# Patient Record
Sex: Female | Born: 1935 | Race: White | Hispanic: No | Marital: Married | Smoking: Former smoker
Health system: Southern US, Community
[De-identification: ages and names within clinical notes are randomized; demographics above are authoritative.]

## PROBLEM LIST (undated history)

## (undated) DIAGNOSIS — I10 Essential (primary) hypertension: Secondary | ICD-10-CM

## (undated) DIAGNOSIS — N904 Leukoplakia of vulva: Secondary | ICD-10-CM

## (undated) DIAGNOSIS — I639 Cerebral infarction, unspecified: Secondary | ICD-10-CM

## (undated) DIAGNOSIS — R569 Unspecified convulsions: Secondary | ICD-10-CM

## (undated) DIAGNOSIS — E039 Hypothyroidism, unspecified: Secondary | ICD-10-CM

## (undated) DIAGNOSIS — I4891 Unspecified atrial fibrillation: Secondary | ICD-10-CM

## (undated) DIAGNOSIS — Z9889 Other specified postprocedural states: Secondary | ICD-10-CM

## (undated) DIAGNOSIS — R112 Nausea with vomiting, unspecified: Secondary | ICD-10-CM

## (undated) DIAGNOSIS — E785 Hyperlipidemia, unspecified: Secondary | ICD-10-CM

## (undated) HISTORY — DX: Leukoplakia of vulva: N90.4

## (undated) HISTORY — PX: BLADDER SURGERY: SHX569

## (undated) HISTORY — DX: Hyperlipidemia, unspecified: E78.5

## (undated) HISTORY — PX: TUBAL LIGATION: SHX77

---

## 2005-10-29 ENCOUNTER — Emergency Department (HOSPITAL_COMMUNITY): Admission: EM | Admit: 2005-10-29 | Discharge: 2005-10-30 | Payer: Self-pay | Admitting: Emergency Medicine

## 2007-01-05 ENCOUNTER — Other Ambulatory Visit: Admission: RE | Admit: 2007-01-05 | Discharge: 2007-01-05 | Payer: Self-pay | Admitting: Family Medicine

## 2011-12-26 DIAGNOSIS — E785 Hyperlipidemia, unspecified: Secondary | ICD-10-CM | POA: Diagnosis not present

## 2011-12-26 DIAGNOSIS — I10 Essential (primary) hypertension: Secondary | ICD-10-CM | POA: Diagnosis not present

## 2011-12-26 DIAGNOSIS — E039 Hypothyroidism, unspecified: Secondary | ICD-10-CM | POA: Diagnosis not present

## 2012-01-30 DIAGNOSIS — L299 Pruritus, unspecified: Secondary | ICD-10-CM | POA: Diagnosis not present

## 2012-02-27 DIAGNOSIS — R21 Rash and other nonspecific skin eruption: Secondary | ICD-10-CM | POA: Diagnosis not present

## 2012-02-27 DIAGNOSIS — N644 Mastodynia: Secondary | ICD-10-CM | POA: Diagnosis not present

## 2012-04-20 DIAGNOSIS — I10 Essential (primary) hypertension: Secondary | ICD-10-CM | POA: Diagnosis not present

## 2012-04-20 DIAGNOSIS — R5381 Other malaise: Secondary | ICD-10-CM | POA: Diagnosis not present

## 2012-04-20 DIAGNOSIS — E785 Hyperlipidemia, unspecified: Secondary | ICD-10-CM | POA: Diagnosis not present

## 2012-04-20 DIAGNOSIS — E039 Hypothyroidism, unspecified: Secondary | ICD-10-CM | POA: Diagnosis not present

## 2012-06-12 DIAGNOSIS — I639 Cerebral infarction, unspecified: Secondary | ICD-10-CM

## 2012-06-12 DIAGNOSIS — I4891 Unspecified atrial fibrillation: Secondary | ICD-10-CM

## 2012-06-12 DIAGNOSIS — R569 Unspecified convulsions: Secondary | ICD-10-CM

## 2012-06-12 HISTORY — DX: Unspecified atrial fibrillation: I48.91

## 2012-06-12 HISTORY — DX: Unspecified convulsions: R56.9

## 2012-06-12 HISTORY — DX: Cerebral infarction, unspecified: I63.9

## 2012-06-30 ENCOUNTER — Encounter (HOSPITAL_COMMUNITY): Payer: Self-pay | Admitting: Nurse Practitioner

## 2012-06-30 ENCOUNTER — Inpatient Hospital Stay (HOSPITAL_COMMUNITY): Payer: Medicare Other

## 2012-06-30 ENCOUNTER — Emergency Department (HOSPITAL_COMMUNITY): Payer: Medicare Other

## 2012-06-30 ENCOUNTER — Inpatient Hospital Stay (HOSPITAL_COMMUNITY)
Admission: EM | Admit: 2012-06-30 | Discharge: 2012-07-04 | DRG: 064 | Disposition: A | Discharge status: Home / Self Care | Payer: Medicare Other | Attending: Internal Medicine | Admitting: Internal Medicine

## 2012-06-30 DIAGNOSIS — I509 Heart failure, unspecified: Secondary | ICD-10-CM | POA: Diagnosis present

## 2012-06-30 DIAGNOSIS — G40401 Other generalized epilepsy and epileptic syndromes, not intractable, with status epilepticus: Secondary | ICD-10-CM | POA: Diagnosis not present

## 2012-06-30 DIAGNOSIS — J9600 Acute respiratory failure, unspecified whether with hypoxia or hypercapnia: Secondary | ICD-10-CM | POA: Diagnosis present

## 2012-06-30 DIAGNOSIS — Z23 Encounter for immunization: Secondary | ICD-10-CM | POA: Diagnosis not present

## 2012-06-30 DIAGNOSIS — I4821 Permanent atrial fibrillation: Secondary | ICD-10-CM | POA: Diagnosis not present

## 2012-06-30 DIAGNOSIS — G40319 Generalized idiopathic epilepsy and epileptic syndromes, intractable, without status epilepticus: Secondary | ICD-10-CM | POA: Diagnosis present

## 2012-06-30 DIAGNOSIS — G40309 Generalized idiopathic epilepsy and epileptic syndromes, not intractable, without status epilepticus: Secondary | ICD-10-CM | POA: Diagnosis present

## 2012-06-30 DIAGNOSIS — I6930 Unspecified sequelae of cerebral infarction: Secondary | ICD-10-CM | POA: Diagnosis present

## 2012-06-30 DIAGNOSIS — G40901 Epilepsy, unspecified, not intractable, with status epilepticus: Secondary | ICD-10-CM

## 2012-06-30 DIAGNOSIS — I6350 Cerebral infarction due to unspecified occlusion or stenosis of unspecified cerebral artery: Secondary | ICD-10-CM | POA: Diagnosis not present

## 2012-06-30 DIAGNOSIS — R569 Unspecified convulsions: Secondary | ICD-10-CM | POA: Diagnosis not present

## 2012-06-30 DIAGNOSIS — R930 Abnormal findings on diagnostic imaging of skull and head, not elsewhere classified: Secondary | ICD-10-CM | POA: Diagnosis not present

## 2012-06-30 DIAGNOSIS — Z09 Encounter for follow-up examination after completed treatment for conditions other than malignant neoplasm: Secondary | ICD-10-CM | POA: Diagnosis not present

## 2012-06-30 DIAGNOSIS — I10 Essential (primary) hypertension: Secondary | ICD-10-CM | POA: Diagnosis not present

## 2012-06-30 DIAGNOSIS — I4891 Unspecified atrial fibrillation: Secondary | ICD-10-CM

## 2012-06-30 DIAGNOSIS — N390 Urinary tract infection, site not specified: Secondary | ICD-10-CM | POA: Diagnosis present

## 2012-06-30 DIAGNOSIS — R5383 Other fatigue: Secondary | ICD-10-CM | POA: Diagnosis not present

## 2012-06-30 DIAGNOSIS — I639 Cerebral infarction, unspecified: Secondary | ICD-10-CM

## 2012-06-30 DIAGNOSIS — E785 Hyperlipidemia, unspecified: Secondary | ICD-10-CM | POA: Diagnosis present

## 2012-06-30 DIAGNOSIS — R4789 Other speech disturbances: Secondary | ICD-10-CM | POA: Diagnosis present

## 2012-06-30 DIAGNOSIS — E876 Hypokalemia: Secondary | ICD-10-CM | POA: Diagnosis present

## 2012-06-30 DIAGNOSIS — G9340 Encephalopathy, unspecified: Secondary | ICD-10-CM

## 2012-06-30 DIAGNOSIS — G8389 Other specified paralytic syndromes: Secondary | ICD-10-CM | POA: Diagnosis present

## 2012-06-30 DIAGNOSIS — J984 Other disorders of lung: Secondary | ICD-10-CM | POA: Diagnosis not present

## 2012-06-30 DIAGNOSIS — J320 Chronic maxillary sinusitis: Secondary | ICD-10-CM | POA: Diagnosis not present

## 2012-06-30 DIAGNOSIS — E039 Hypothyroidism, unspecified: Secondary | ICD-10-CM | POA: Diagnosis present

## 2012-06-30 DIAGNOSIS — D696 Thrombocytopenia, unspecified: Secondary | ICD-10-CM | POA: Diagnosis present

## 2012-06-30 DIAGNOSIS — R5381 Other malaise: Secondary | ICD-10-CM | POA: Diagnosis not present

## 2012-06-30 DIAGNOSIS — I6340 Cerebral infarction due to embolism of unspecified cerebral artery: Principal | ICD-10-CM | POA: Diagnosis present

## 2012-06-30 DIAGNOSIS — J9819 Other pulmonary collapse: Secondary | ICD-10-CM | POA: Diagnosis not present

## 2012-06-30 DIAGNOSIS — Z8673 Personal history of transient ischemic attack (TIA), and cerebral infarction without residual deficits: Secondary | ICD-10-CM | POA: Diagnosis not present

## 2012-06-30 DIAGNOSIS — A498 Other bacterial infections of unspecified site: Secondary | ICD-10-CM | POA: Diagnosis present

## 2012-06-30 DIAGNOSIS — R404 Transient alteration of awareness: Secondary | ICD-10-CM | POA: Diagnosis not present

## 2012-06-30 DIAGNOSIS — R918 Other nonspecific abnormal finding of lung field: Secondary | ICD-10-CM | POA: Diagnosis not present

## 2012-06-30 DIAGNOSIS — R4182 Altered mental status, unspecified: Secondary | ICD-10-CM | POA: Diagnosis not present

## 2012-06-30 DIAGNOSIS — D72829 Elevated white blood cell count, unspecified: Secondary | ICD-10-CM | POA: Diagnosis present

## 2012-06-30 DIAGNOSIS — I059 Rheumatic mitral valve disease, unspecified: Secondary | ICD-10-CM | POA: Diagnosis not present

## 2012-06-30 HISTORY — DX: Cerebral infarction, unspecified: I63.9

## 2012-06-30 HISTORY — DX: Unspecified convulsions: R56.9

## 2012-06-30 HISTORY — DX: Unspecified atrial fibrillation: I48.91

## 2012-06-30 HISTORY — DX: Hypothyroidism, unspecified: E03.9

## 2012-06-30 HISTORY — DX: Essential (primary) hypertension: I10

## 2012-06-30 LAB — TROPONIN I: Troponin I: <0.3 ng/mL (ref <0.30)

## 2012-06-30 LAB — CBC
HCT: 48.7 % — ABNORMAL HIGH (ref 36.0–46.0)
Hemoglobin: 16.6 g/dL — ABNORMAL HIGH (ref 12.0–15.0)
MCH: 31.4 pg (ref 26.0–34.0)
MCV: 92.1 fL (ref 78.0–100.0)
Platelets: 179 10*3/uL (ref 150–400)
RBC: 5.29 MIL/uL — ABNORMAL HIGH (ref 3.87–5.11)
RDW: 13 % (ref 11.5–15.5)

## 2012-06-30 LAB — COMPREHENSIVE METABOLIC PANEL
Alkaline Phosphatase: 71 U/L (ref 39–117)
CO2: 22 mEq/L (ref 19–32)
Calcium: 9.9 mg/dL (ref 8.4–10.5)
Chloride: 103 mEq/L (ref 96–112)
GFR calc Af Amer: 72 mL/min — ABNORMAL LOW (ref >90)

## 2012-06-30 LAB — DIFFERENTIAL
Basophils Absolute: 0 10*3/uL (ref 0.0–0.1)
Eosinophils Relative: 3 % (ref 0–5)
Lymphocytes Relative: 45 % (ref 12–46)
Neutrophils Relative %: 42 % — ABNORMAL LOW (ref 43–77)

## 2012-06-30 LAB — PROTIME-INR: INR: 1.07 (ref 0.00–1.49)

## 2012-06-30 LAB — POCT I-STAT TROPONIN I

## 2012-06-30 LAB — URINALYSIS, ROUTINE W REFLEX MICROSCOPIC
Glucose, UA: 100 mg/dL — AB
Ketones, ur: NEGATIVE mg/dL
Protein, ur: NEGATIVE mg/dL
Urobilinogen, UA: 0.2 mg/dL (ref 0.0–1.0)

## 2012-06-30 LAB — URINE MICROSCOPIC-ADD ON

## 2012-06-30 LAB — POCT I-STAT, CHEM 8
BUN: 13 mg/dL (ref 6–23)
Creatinine, Ser: 0.9 mg/dL (ref 0.50–1.10)
Glucose, Bld: 157 mg/dL — ABNORMAL HIGH (ref 70–99)
Hemoglobin: 17.3 g/dL — ABNORMAL HIGH (ref 12.0–15.0)
TCO2: 22 mmol/L (ref 0–100)

## 2012-06-30 LAB — RAPID URINE DRUG SCREEN, HOSP PERFORMED
Amphetamines: NOT DETECTED
Barbiturates: NOT DETECTED
Cocaine: NOT DETECTED
Opiates: NOT DETECTED

## 2012-06-30 LAB — POCT I-STAT 3, ART BLOOD GAS (G3+)
Acid-base deficit: 7 mmol/L — ABNORMAL HIGH (ref 0.0–2.0)
Bicarbonate: 21.7 mEq/L (ref 20.0–24.0)
TCO2: 23 mmol/L (ref 0–100)
pO2, Arterial: 354 mmHg — ABNORMAL HIGH (ref 80.0–100.0)

## 2012-06-30 LAB — APTT: aPTT: 31 s (ref 24–37)

## 2012-06-30 MED ORDER — PROPOFOL 10 MG/ML IV EMUL
5.0000 ug/kg/min | INTRAVENOUS | Status: DC
  Administered 2012-06-30: 15 ug/kg/min via INTRAVENOUS
  Administered 2012-06-30: 5 ug/kg/min via INTRAVENOUS
  Administered 2012-07-01: 25 ug/kg/min via INTRAVENOUS
  Filled 2012-06-30 (×3): qty 100

## 2012-06-30 MED ORDER — ROCURONIUM BROMIDE 50 MG/5ML IV SOLN
INTRAVENOUS | Status: AC
  Filled 2012-06-30: qty 2

## 2012-06-30 MED ORDER — LABETALOL HCL 5 MG/ML IV SOLN
10.0000 mg | Freq: Once | INTRAVENOUS | Status: AC
  Administered 2012-06-30: 10 mg via INTRAVENOUS

## 2012-06-30 MED ORDER — MIDAZOLAM HCL 2 MG/2ML IJ SOLN
4.0000 mg | Freq: Once | INTRAMUSCULAR | Status: AC
  Administered 2012-06-30: 4 mg via INTRAVENOUS
  Filled 2012-06-30: qty 4

## 2012-06-30 MED ORDER — BIOTENE DRY MOUTH MT LIQD
15.0000 mL | Freq: Four times a day (QID) | OROMUCOSAL | Status: DC
  Administered 2012-06-30 – 2012-07-01 (×5): 15 mL via OROMUCOSAL

## 2012-06-30 MED ORDER — SUCCINYLCHOLINE CHLORIDE 20 MG/ML IJ SOLN
INTRAMUSCULAR | Status: AC
  Administered 2012-06-30: 200 mg
  Filled 2012-06-30: qty 1

## 2012-06-30 MED ORDER — LIDOCAINE HCL (CARDIAC) 20 MG/ML IV SOLN
INTRAVENOUS | Status: AC
  Administered 2012-06-30: 100 mg
  Filled 2012-06-30: qty 5

## 2012-06-30 MED ORDER — LABETALOL HCL 5 MG/ML IV SOLN
INTRAVENOUS | Status: AC
  Administered 2012-06-30: 10 mg via INTRAVENOUS
  Filled 2012-06-30: qty 4

## 2012-06-30 MED ORDER — SODIUM CHLORIDE 0.9 % IV SOLN
INTRAVENOUS | Status: DC
  Administered 2012-06-30 – 2012-07-01 (×3): via INTRAVENOUS

## 2012-06-30 MED ORDER — CHLORHEXIDINE GLUCONATE 0.12 % MT SOLN
15.0000 mL | Freq: Two times a day (BID) | OROMUCOSAL | Status: DC
  Administered 2012-06-30 – 2012-07-01 (×3): 15 mL via OROMUCOSAL
  Filled 2012-06-30 (×3): qty 15

## 2012-06-30 MED ORDER — SODIUM CHLORIDE 0.9 % IV SOLN
500.0000 mg | Freq: Two times a day (BID) | INTRAVENOUS | Status: DC
  Administered 2012-06-30 – 2012-07-03 (×7): 500 mg via INTRAVENOUS
  Filled 2012-06-30 (×8): qty 5

## 2012-06-30 MED ORDER — NICARDIPINE HCL IN NACL 20-0.86 MG/200ML-% IV SOLN
5.0000 mg/h | INTRAVENOUS | Status: DC
  Administered 2012-06-30: 7.5 mg/h via INTRAVENOUS
  Administered 2012-06-30: 5 mg/h via INTRAVENOUS
  Administered 2012-07-01: 4 mg/h via INTRAVENOUS
  Filled 2012-06-30 (×6): qty 200

## 2012-06-30 MED ORDER — FENTANYL CITRATE 0.05 MG/ML IJ SOLN
100.0000 ug | Freq: Once | INTRAMUSCULAR | Status: AC
  Administered 2012-06-30: 100 ug via INTRAVENOUS
  Filled 2012-06-30: qty 2

## 2012-06-30 MED ORDER — SODIUM CHLORIDE 0.9 % IV SOLN
1000.0000 mg | Freq: Once | INTRAVENOUS | Status: AC
  Administered 2012-06-30: 1000 mg via INTRAVENOUS
  Filled 2012-06-30: qty 10

## 2012-06-30 MED ORDER — SODIUM CHLORIDE 0.9 % IV BOLUS (SEPSIS)
1000.0000 mL | Freq: Once | INTRAVENOUS | Status: AC
  Administered 2012-06-30: 1000 mL via INTRAVENOUS

## 2012-06-30 MED ORDER — ASPIRIN 300 MG RE SUPP
300.0000 mg | Freq: Every day | RECTAL | Status: DC
  Administered 2012-06-30: 300 mg via RECTAL
  Filled 2012-06-30 (×3): qty 1

## 2012-06-30 MED ORDER — ETOMIDATE 2 MG/ML IV SOLN
INTRAVENOUS | Status: AC
  Administered 2012-06-30: 20 mg
  Filled 2012-06-30: qty 20

## 2012-06-30 MED ORDER — LORAZEPAM 2 MG/ML IJ SOLN
INTRAMUSCULAR | Status: AC
  Administered 2012-06-30: 2 mg
  Filled 2012-06-30: qty 2

## 2012-06-30 MED ORDER — PANTOPRAZOLE SODIUM 40 MG IV SOLR
40.0000 mg | Freq: Every day | INTRAVENOUS | Status: DC
  Administered 2012-06-30 – 2012-07-02 (×3): 40 mg via INTRAVENOUS
  Filled 2012-06-30 (×4): qty 40

## 2012-06-30 NOTE — Procedures (Signed)
Central Venous Catheter Insertion Procedure Note Melissa Knight [id number] Jan 10, 1933  Procedure: Insertion of Central Venous Catheter Indications: Assessment of intravascular volume, Drug and/or fluid administration and Frequent blood sampling  Procedure Details Consent: Risks of procedure as well as the alternatives and risks of each were explained to the (patient/caregiver).  Consent for procedure obtained. Time Out: Verified patient identification, verified procedure, site/side was marked, verified correct patient position, special equipment/implants available, medications/allergies/relevent history reviewed, required imaging and test results available.  Performed  Maximum sterile technique was used including antiseptics, cap, gloves, gown, hand hygiene, mask and sheet. Skin prep: Chlorhexidine; local anesthetic administered A antimicrobial bonded/coated triple lumen catheter was placed in the right internal jugular vein using the Seldinger technique. Ultrasound guidance used.yes Catheter placed to 18 cm. Blood aspirated via all 3 ports and then flushed x 3. Line sutured x 2 and dressing applied.  Evaluation Blood flow good Complications: No apparent complications Patient did tolerate procedure well. Chest X-ray ordered to verify placement.  CXR: pending.  Brett Canales Minor ACNP [facility] PCCM Pager [phone] till 3 pm If no answer page [phone] 06/30/2012, 12:03 PM   I was present for this procedure. Luisa Hart WrightMD

## 2012-06-30 NOTE — ED Notes (Signed)
Per ems: husband states he heard the pt get up normally this morning to use the restroom, he did not talk to her at the time. Shortly after at 0730 he found her in the bed not talking or moving her L side. On ems arrival, code stroke called LSN 0730. Ems reports pt with sluggish L pupil and L sided weakness en route. ON arrival to ED pt began to have seizure, Dr Bebe Shaggy at bedside to intubate the pt

## 2012-06-30 NOTE — H&P (Signed)
Name: Melissa Knight MRN: [id number] DOB: 12/24/1932    LOS: 0  Referring Provider:  EDP Reason for Referral:  Seizure, VDRF, CVA  PULMONARY / CRITICAL CARE MEDICINE  HPI:  78 year old white female admitted with acute onset right parietal stroke with status epilepticus and intubated. Critical Care called for consultation. Past history is not fully available. The patient was last seen normal last night and went to bed and when she awoke this morning she had right gaze preference snoring respirations and left-sided weakness. Her husband called EMS who responded and 15 minutes and they brought the patient to [facility] emergency room whereupon the patient was intubated. The patient was in a seizure type state upon arrival. CT scan shows a right parietal infarct. Patient is hypertensive at this time. There is a past history of hypothyroidism and congestive failure and hypertension. We do not have a full past history on this patient.  Past Medical History  Diagnosis Date  . Hypertension   . CHF (congestive heart failure)   . Thyroid disease    History reviewed. No pertinent past surgical history. Prior to Admission medications   Not on File   Allergies Not on File  Family History History reviewed. No pertinent family history. Social History  does not have a smoking history on file. She does not have any smokeless tobacco history on file. Her alcohol and drug histories not on file.  Review Of Systems:  Review of system is taken in detail and is negative except as noted above large portions of the review of system cannot be fully obtained as the patient is unresponsive  Brief patient description:  78 year-old female with right parietal stroke and seizures on mechanical ventilatory support  Events Since Admission: CT scan of the head 06/30/2012 shows right parietal stroke  Current Status: Critically ill on mechanical ventilatory support and hypertensive  Vital Signs: Temp:  [97.3 F  (36.3 C)] 97.3 F (36.3 C) (10/19 1145) Pulse Rate:  [71-77] 77  (10/19 1145) Resp:  [16-18] 16  (10/19 1145) BP: (149-228)/(82-136) 206/136 mmHg (10/19 1145) SpO2:  [96 %-99 %] 99 % (10/19 1145) FiO2 (%):  [50 %-100 %] 50 % (10/19 1020) Weight:  [81.647 kg (180 lb)] 81.647 kg (180 lb) (10/19 1043)  Physical Examination: General:  Sedated with endotracheal tube in place not actively seizing Neuro:  Left-sided weakness and sedated HEENT:  No lesions Neck:  No jugular venous distention no thyromegaly no lymphadenopathy Cardiovascular:  Regular rate and rhythm normal S1-S2 no S3 no S4 Lungs:  Distant breath sounds Abdomen:   Soft nontender no organomegaly Musculoskeletal:  Full range of motion Skin:  Clear  Principal Problem:  *CVA (cerebral infarction) Active Problems:  Seizure disorder, generalized convulsive, intractable  Acute respiratory failure  HTN (hypertension), malignant   ASSESSMENT AND PLAN  PULMONARY  Lab 06/30/12 1015  PHART 7.214*  PCO2ART 53.9*  PO2ART 354.0*  HCO3 21.7  O2SAT 100.0   Ventilator Settings: Vent Mode:  [-] PRVC FiO2 (%):  [50 %-100 %] 50 % Set Rate:  [14 bmp-16 bmp] 16 bmp Vt Set:  [500 mL-550 mL] 550 mL PEEP:  [5 cmH20] 5 cmH20 Plateau Pressure:  [8 cmH20-19 cmH20] 19 cmH20 CXR:  ET tube in good position with no active disease ETT:  06/30/2012  A:  Acute respiratory failure due to seizures with right parietal stroke  P:   Full for support Daily wakeup assessments and SBT  CARDIOVASCULAR  Lab 06/30/12 0920  TROPONINI <0.30  LATICACIDVEN --  PROBNP --   ECG:  Reviewed Lines: Peripheral IVs  A: Hypertensive P:  Obtain echo Cardene drip  RENAL  Lab 06/30/12 0929 06/30/12 0920  NA 146* 146*  K 3.5 3.3*  CL 106 103  CO2 -- 22  BUN 13 12  CREATININE 0.90 0.88  CALCIUM -- 9.9  MG -- --  PHOS -- --   Intake/Output    None    Foley:  In place  A:  No acute renal issues P:   Monitor  GASTROINTESTINAL  Lab  06/30/12 0920  AST 26  ALT 18  ALKPHOS 71  BILITOT 0.6  PROT 7.5  ALBUMIN 3.8    A:  No GI issues P:   Monitor  HEMATOLOGIC  Lab 06/30/12 0929 06/30/12 0920  HGB 17.3* 16.6*  HCT 51.0* 48.7*  PLT -- 179  INR -- 1.07  APTT -- 31   A:  No hematologic issues the patient was not given thrombolytics P:  Monitor  INFECTIOUS  Lab 06/30/12 0920  WBC 13.4*  PROCALCITON --   Cultures: None Antibiotics: None  A:  No infectious disease issues P:   Monitor  ENDOCRINE  Lab 06/30/12 0921  GLUCAP 157*   A:   Mild hyperglycemia   P:   Monitor CBGs  NEUROLOGIC  A:  Acute right parietal stroke with status epilepticus P:   Stroke admission orders set Followup CT scan and MRI Neurology consultation Load with IV Keppra and maintenance dose Propofol sedation   BEST PRACTICE / DISPOSITION Level of Care:  ICU Primary Service:  PC CM Consultants:  Neurology Code Status:  Full Diet:  N.p.o. DVT Px:  SCDs GI Px:  Protonix  IV Skin Integrity:  Intact Social / Family:  Family updated at bedside   CC  Shan Levans, M.D. Pulmonary and Critical Care Medicine [facility] Pager: [phone]  06/30/2012, 11:52 AM

## 2012-06-30 NOTE — ED Notes (Signed)
Dr. Bebe Shaggy and RT preparing for intubation.

## 2012-06-30 NOTE — Consult Note (Signed)
Chief Complaint: New-onset left-sided weakness and slurred speech presenting in code stroke status.  HPI: Melissa Knight is an 78 y.o. female with a history of hypertension, congestive heart failure and thyroid disease, presenting with new onset left-sided weakness and speech difficulty as well as gaze preference to the left side. Patient was brought to the hospital in code stroke status. On the way to CT scan she had a witnessed generalized seizure and was returned to the emergency room and subsequently intubated for airway protection. She was given Ativan 2 mg IV. CT scan of her head showed low density area involving the right parieto-occipital region, possibly a manifestation of subacute infarction. There was no sign of intracranial hemorrhage. NIH score could not be adequately assessed because the patient was intubated and had been given paralytic agents.  LSN: 7:30 AM today tPA Given: No: Witnessed generalized seizure activity MRankin: 4  Past Medical History  Diagnosis Date  . Hypertension   . CHF (congestive heart failure)   . Thyroid disease     Family history: Unavailable  Medications:  Prior to Admission:  No prescriptions prior to admission     Physical Examination: Blood pressure 149/82, height 5\' 5"  (1.651 m), weight 81.647 kg (180 lb).  Neurologic Examination: Patient was intubated and on mechanical ventilation. No clear spontaneous respirations were seen. Patient was responsive with slight withdrawal movements of right extremities to noxious stimuli, but no movements of left extremities to noxious stimuli. Pupils were equal and reacted normally to light. Extraocular movements are absent with oculocephalic maneuvers. There was no clear facial asymmetry. Tone was increased in right upper and lower extremities with flaccid left extremities. There was no abnormal posturing. Deep tendon reflexes were 2+ and brisk, and symmetrical. Plantar response on the left was extensor  on the right and mute. Carotid auscultation was normal.  Ct Head Wo Contrast  06/30/2012  *RADIOLOGY REPORT*  Clinical Data: Weakness.  Left-sided gaze.  Code stroke.  CT HEAD WITHOUT CONTRAST  Technique:  Contiguous axial images were obtained from the base of the skull through the vertex without contrast.  Comparison: None.  Findings: The brain stem, cerebral peduncles, thalami, ventricular system, and basilar cisterns appear unremarkable.  Remote lacunar infarct in the head of the right caudate nucleus noted.  Abnormal hypodensity noted in the right parietal lobe.  To involve cortex and white matter, suspicious for cytotoxic edema.  Patchy white matter hypodensities are present in the periventricular white matter  No intracranial hemorrhage observed. No definite mass lesion. Polypoid mucoperiosteal thickening noted in the right maxillary sinus, with mild mucosal thickening in the left maxillary sinus. Minimal sphenoid and ethmoid chronic sinusitis.  IMPRESSION:  1.  Right parietal lobe hypodensity on image 16 suspicious for subacute stroke, potentially in the posterior watershed distribution.  No hemorrhage identified. 2.  Patchy white matter hypodensities in the periventricular white matter, most likely chronic ischemic microvascular white matter disease. 3.  Old remote lacunar infarct of the head of the right caudate nucleus. 4.  Chronic maxillary sinusitis with mild chronic sphenoid and ethmoid sinusitis.  I discussed these findings by telephone with Dr. Bebe Shaggy at 10:11 a.m. on 06/30/2012.   Original Report Authenticated By: Dellia Cloud, M.D.    Dg Chest Port 1 View  06/30/2012  *RADIOLOGY REPORT*  Clinical Data: Weakness, endotracheal tube placement  PORTABLE CHEST - 1 VIEW  Comparison: 10/30/2005  Findings: Cardiomediastinal silhouette is stable.  There is poor inspiration.  Mild basilar atelectasis.  Endotracheal tube in place  with tip 2.4 cm above the carina.  NG tube in place.  No  diagnostic pneumothorax.  Stable calcified granuloma right midlung. No acute infiltrate or pulmonary edema.  IMPRESSION: Endotracheal tube in place with tip 2.4 cm above the carina.  No diagnostic pneumothorax.  NG tube in place. No acute infiltrate or pulmonary edema.   Original Report Authenticated By: Natasha Mead, M.D.     Assessment: 78 y.o. female presenting with probable acute right ischemic stroke as well as generalized seizure activity of new onset.  Stroke Risk Factors - hypertension  Plan: 1. HgbA1c, fasting lipid panel 2. MRI, MRA  of the brain without contrast 3. PT consult, OT consult, Speech consult 4. Echocardiogram 5. Carotid dopplers 6. Prophylactic therapy-Antiplatelet med: Aspirin 300 mg per day rectally 7. Keppra 1000 mg loading dose IV followed by 500 mg every 12 hours for seizure management 8. EEG on 07/02/2012.  C.R. Roseanne Reno, MD [facility]  06/30/2012, 11:05 AM

## 2012-06-30 NOTE — ED Provider Notes (Signed)
History     CSN: [id number]  Arrival date & time 06/30/12  7829   First MD Initiated Contact with Patient 06/30/12 [phone]      Chief Complaint  Patient presents with  . Code Stroke     Patient is a 78 y.o. female presenting with weakness. The history is provided by the EMS personnel. The history is limited by the condition of the patient.  Weakness Primary symptoms do not include fever. Episode onset: at 0730am. The symptoms are worsening. The neurological symptoms are focal.  Additional symptoms include weakness.  nothing improves symptoms Nothing worsens symptoms  Pt presents for code stroke with EMS Per EMS, pt last seen normal at 0730, then had left sided weakness/confusion En route, EMS reports she became more confused No other details are known at this time   PMH - unknown   No past surgical history on file.  No family history on file.  History  Substance Use Topics  . Smoking status: Not on file  . Smokeless tobacco: Not on file  . Alcohol Use: Not on file  Social history - unknown  OB History    No data available      Review of Systems  Unable to perform ROS: Mental status change  Constitutional: Negative for fever.  Neurological: Positive for weakness.    Allergies  Review of patient's allergies indicates not on file.  Home Medications  No current outpatient prescriptions on file.  BP 149/82 BP 149/82  Ht 5\' 5"  (1.651 m)   Physical Exam CONSTITUTIONAL: Well developed/well nourished HEAD AND FACE: Normocephalic/atraumatic EYES: PERRL, deviates to left ENMT: Mucous membranes moist, tongue laceration noted, blood noted in mouth NECK: supple no meningeal signs SPINE:entire spine nontender CV: S1/S2 noted, no murmurs/rubs/gallops noted LUNGS: coarse BS noted bilaterally ABDOMEN: soft, nontender, no rebound or guarding GU:no cva tenderness NEURO: Pt is awake but does not respond to voice.  She has some motor in right hand and will resist  when I lift her arm EXTREMITIES: pulses normal, full ROM SKIN: warm, color normal PSYCH: altered mental status  ED Course  OG placement Date/Time: 06/30/2012 9:41 AM Performed by: Joya Gaskins Authorized by: Joya Gaskins Consent: The procedure was performed in an emergent situation. Patient sedated: yes Vitals: Vital signs were monitored during sedation. Patient tolerance: Patient tolerated the procedure well with no immediate complications. Comments: OG tube placed by myself immediately after intubation     INTUBATION Performed by: Joya Gaskins  Required items: required blood products, implants, devices, and special equipment available Patient identity confirmed: provided demographic data and hospital-assigned identification number Time out: Immediately prior to procedure a "time out" was called to verify the correct patient, procedure, equipment, support staff and site/side marked as required.  Indications: seizure, unresponsive  Intubation method: Glidescope Laryngoscopy   Preoxygenation: BVM  Sedatives: Etomidate Paralytic: Succinylcholine  Tube Size: 7.5 cuffed  Post-procedure assessment: chest rise and ETCO2 monitor Breath sounds: equal and absent over the epigastrium Tube secured with: ETT holder Chest x-ray interpreted by radiologist and me.   Patient tolerated the procedure well with no immediate complications.    CRITICAL CARE Performed by: Joya Gaskins   Total critical care time: 40  Critical care time was exclusive of separately billable procedures and treating other patients.  Critical care was necessary to treat or prevent imminent or life-threatening deterioration.  Critical care was time spent personally by me on the following activities: development of treatment plan with patient and/or surrogate as  well as nursing, discussions with consultants, evaluation of patient's response to treatment, examination of patient,  obtaining history from patient or surrogate, ordering and performing treatments and interventions, ordering and review of laboratory studies, ordering and review of radiographic studies, pulse oximetry and re-evaluation of patient's condition.    Labs Reviewed  GLUCOSE, CAPILLARY - Abnormal; Notable for the following:    Glucose-Capillary 157 (*)     All other components within normal limits  POCT I-STAT, CHEM 8 - Abnormal; Notable for the following:    Sodium 146 (*)     Glucose, Bld 157 (*)     Hemoglobin 17.3 (*)     HCT 51.0 (*)     All other components within normal limits  PROTIME-INR  APTT  CBC  DIFFERENTIAL  COMPREHENSIVE METABOLIC PANEL  TROPONIN I  URINE RAPID DRUG SCREEN (HOSP PERFORMED)   9:39 AM Pt seen on arrival (0916am) for concern for code stroke (LSN at 0730 per EMS) On arrival, she was protecting airway/awake but not responding to questions On her way to CT scanner she had tonic clonic seizure She was given ativan CBG>100 Pt was immediately intubated without difficulty Will send back to CT scanner 10:15 AM D/w radiology No ICH She has subacute infarct D/w dr Roseanne Reno - recommend CT angio head/neck to eval for any lesion that could be removed by IR Sedation ordered Will speak to family Dr Roseanne Reno does not recommend IV TPA 10:17 AM Dr Roseanne Reno decides to cancel ct head/neck and admit to critical care tPA in stroke considered but not given due to:  Pt had seizure and cancelled by dr Roseanne Reno  10:32 AM Spoke to family Husband reports this morning she "staring off" and had "rigid body" Possible seizure this morning D/w critical care dr Delford Field will admit to icu Ok to write holding orders   MDM  Nursing notes including past medical history and social history reviewed and considered in documentation Labs/vital reviewed and considered xrays reviewed and considered        Date: 06/30/2012  Rate: 102  Rhythm: normal sinus rhythm  QRS Axis:  normal  Intervals: normal  ST/T Wave abnormalities: nonspecific ST changes  Conduction Disutrbances:none  Narrative Interpretation:   Old EKG Reviewed: unchanged    Joya Gaskins, MD 06/30/12 [phone]

## 2012-06-30 NOTE — ED Notes (Signed)
Pt withdrawing to insertion of foley.

## 2012-06-30 NOTE — Progress Notes (Signed)
  Echocardiogram 2D Echocardiogram has been performed.  Melissa Knight 06/30/2012, 4:51 PM

## 2012-06-30 NOTE — ED Notes (Signed)
Code Stroke Encoded 0908 Code Stroke Called 0900 Patient arrival [phone] EDP exam 0916 Stroke Team Arrival [phone] Last seen normal 0730 Pt arrival in CT 0950 pt was intubated d/t seizure activity while initially in route to CT.

## 2012-06-30 NOTE — ED Notes (Signed)
Pt returned from CT back to trauma A.

## 2012-06-30 NOTE — ED Notes (Signed)
Portable chest x ray completed.

## 2012-06-30 NOTE — ED Notes (Signed)
CBG completed of 157

## 2012-06-30 NOTE — ED Notes (Signed)
Brett Canales, NP at the bedside speaking with the family.

## 2012-06-30 NOTE — ED Notes (Signed)
Pt on the way to CT and started seizing prior to exiting through the doors to xray hallway. Pt pulled into Trauma A and moved onto stretcher. Dr. Bebe Shaggy at the bedside. Morrie Sheldon, RN, Aundra Millet, RN at the bedside with EMS.

## 2012-06-30 NOTE — Progress Notes (Signed)
Chaplain note: While looking for another patient in the ED I met patients husband in hallway near Consult B and engaged him in conversation.  He informed me his wife had had a massive stroke and was in Trauma A. Chaplain provided emotional support and refreshments for family.  Will check on family throughout the day as needed.  Rutherford Nail Chaplain

## 2012-06-30 NOTE — Procedures (Signed)
Arterial Catheter Insertion Procedure Note Melissa Knight [id number] 1933/04/09  Procedure: Insertion of Arterial Catheter  Indications: Blood pressure monitoring  Procedure Details Consent: Risks of procedure as well as the alternatives and risks of each were explained to the (patient/caregiver).  Consent for procedure obtained. Time Out: Verified patient identification, verified procedure, site/side was marked, verified correct patient position, special equipment/implants available, medications/allergies/relevent history reviewed, required imaging and test results available.  Performed  Maximum sterile technique was used including antiseptics, cap, gloves, gown, hand hygiene and mask. Skin prep: Chlorhexidine; local anesthetic administered 20 gauge catheter was inserted into left radial artery using the Seldinger technique.  Evaluation Blood flow good; BP tracing good. Complications: No apparent complications.   Aline inserted at this time. Attempts x 3. Inserted with the help of Vernell Morgans. Secured and zeroed per policy. BP 209/101. RT will continue to monitor.  Lurlean Leyden 06/30/2012

## 2012-07-01 ENCOUNTER — Inpatient Hospital Stay (HOSPITAL_COMMUNITY): Payer: Medicare Other

## 2012-07-01 DIAGNOSIS — R5383 Other fatigue: Secondary | ICD-10-CM | POA: Diagnosis not present

## 2012-07-01 DIAGNOSIS — G40319 Generalized idiopathic epilepsy and epileptic syndromes, intractable, without status epilepticus: Secondary | ICD-10-CM | POA: Diagnosis not present

## 2012-07-01 DIAGNOSIS — R918 Other nonspecific abnormal finding of lung field: Secondary | ICD-10-CM | POA: Diagnosis not present

## 2012-07-01 DIAGNOSIS — R404 Transient alteration of awareness: Secondary | ICD-10-CM | POA: Diagnosis not present

## 2012-07-01 DIAGNOSIS — I6350 Cerebral infarction due to unspecified occlusion or stenosis of unspecified cerebral artery: Secondary | ICD-10-CM | POA: Diagnosis not present

## 2012-07-01 DIAGNOSIS — G40401 Other generalized epilepsy and epileptic syndromes, not intractable, with status epilepticus: Secondary | ICD-10-CM | POA: Diagnosis not present

## 2012-07-01 DIAGNOSIS — J9600 Acute respiratory failure, unspecified whether with hypoxia or hypercapnia: Secondary | ICD-10-CM | POA: Diagnosis not present

## 2012-07-01 DIAGNOSIS — J9819 Other pulmonary collapse: Secondary | ICD-10-CM | POA: Diagnosis not present

## 2012-07-01 LAB — BASIC METABOLIC PANEL
BUN: 11 mg/dL (ref 6–23)
CO2: 22 mEq/L (ref 19–32)
Calcium: 8.1 mg/dL — ABNORMAL LOW (ref 8.4–10.5)
Chloride: 104 mEq/L (ref 96–112)
Creatinine, Ser: 0.67 mg/dL (ref 0.50–1.10)

## 2012-07-01 LAB — CBC
HCT: 41.4 % (ref 36.0–46.0)
Hemoglobin: 14.4 g/dL (ref 12.0–15.0)
MCH: 30.3 pg (ref 26.0–34.0)
MCHC: 34.8 g/dL (ref 30.0–36.0)
MCV: 87.2 fL (ref 78.0–100.0)
Platelets: 147 10*3/uL — ABNORMAL LOW (ref 150–400)
RBC: 4.75 MIL/uL (ref 3.87–5.11)
RDW: 13.4 % (ref 11.5–15.5)
WBC: 13.9 10*3/uL — ABNORMAL HIGH (ref 4.0–10.5)

## 2012-07-01 LAB — LIPID PANEL
HDL: 57 mg/dL (ref >39)
LDL Cholesterol: 156 mg/dL — ABNORMAL HIGH (ref 0–99)
Total CHOL/HDL Ratio: 4.4 RATIO
Triglycerides: 177 mg/dL — ABNORMAL HIGH (ref <150)
VLDL: 35 mg/dL (ref 0–40)

## 2012-07-01 LAB — GLUCOSE, CAPILLARY
Glucose-Capillary: 124 mg/dL — ABNORMAL HIGH (ref 70–99)
Glucose-Capillary: 100 mg/dL — ABNORMAL HIGH (ref 70–99)

## 2012-07-01 LAB — HEMOGLOBIN A1C
Hgb A1c MFr Bld: 6.1 % — ABNORMAL HIGH (ref <5.7)
Mean Plasma Glucose: 128 mg/dL — ABNORMAL HIGH (ref <117)

## 2012-07-01 LAB — PRO B NATRIURETIC PEPTIDE: Pro B Natriuretic peptide (BNP): 1230 pg/mL — ABNORMAL HIGH (ref 0–450)

## 2012-07-01 MED ORDER — METOPROLOL TARTRATE 1 MG/ML IV SOLN
2.5000 mg | INTRAVENOUS | Status: DC | PRN
  Filled 2012-07-01: qty 5

## 2012-07-01 MED ORDER — POTASSIUM CHLORIDE 10 MEQ/100ML IV SOLN
10.0000 meq | INTRAVENOUS | Status: AC
  Administered 2012-07-01 (×4): 10 meq via INTRAVENOUS
  Filled 2012-07-01: qty 400

## 2012-07-01 MED ORDER — ASPIRIN 325 MG PO TABS
325.0000 mg | ORAL_TABLET | Freq: Every day | ORAL | Status: DC
  Administered 2012-07-01 – 2012-07-02 (×2): 325 mg via ORAL
  Filled 2012-07-01 (×2): qty 1

## 2012-07-01 MED ORDER — BIOTENE DRY MOUTH MT LIQD
15.0000 mL | Freq: Two times a day (BID) | OROMUCOSAL | Status: DC
  Administered 2012-07-01: 15 mL via OROMUCOSAL

## 2012-07-01 MED ORDER — POTASSIUM CHLORIDE 20 MEQ/15ML (10%) PO LIQD
40.0000 meq | ORAL | Status: DC
  Administered 2012-07-01: 40 meq
  Filled 2012-07-01: qty 30

## 2012-07-01 NOTE — Evaluation (Signed)
Clinical/Bedside Swallow Evaluation Patient Details  Name: Melissa Knight MRN: [id number] Date of Birth: March 13, 1933  Today's Date: 07/01/2012 Time: 1400-1445 SLP Time Calculation (min): 45 min  Past Medical History:  Past Medical History  Diagnosis Date  . Hypertension   . CHF (congestive heart failure)   . Thyroid disease    Past Surgical History: History reviewed. No pertinent past surgical history. HPI:   The patient was last seen normal 10/18 at night and went to bed.  In am patient had right gaze preference snoring respirations and left-sided weakness. Her husband called EMS who responded and 15 minutes and they brought the patient to [facility] emergency room whereupon the patient was intubated. The patient was in a seizure type state upon arrival. CT scan shows a right parietal infarct. Patient is hypertensive at this time. There is a past history of hypothyroidism and congestive failure and hypertension. Patient referred for BSE per stroke protocol.      Assessment / Plan / Recommendation Clinical Impression  Oropharyngeal swallow appeared functional with adequate hyoid laryngeal elevation and initiation upon palpation.  Dry coughs with delayed throat clears s/p swallow of cracker.  Nursing reports baseline dry cough s/p extubation. Patient reports some discomfort during swallow of regular solids .  Recommend to proceed with conservative modified diet of dysphagia 2 (finely chopped) mainly for comfort and recommend thin liquids cup sips only with full supervision due to noted decreased safety awareness.  ST indicated in acute care setting for diet tolerance and possible advancement.     Aspiration Risk  Moderate    Diet Recommendation Dysphagia 2 (Fine chop);Thin liquid   Liquid Administration via: Cup;No straw Medication Administration: Crushed with puree Supervision: Full supervision/cueing for compensatory strategies Compensations: Slow rate;Small sips/bites;Clear throat  intermittently Postural Changes and/or Swallow Maneuvers: Out of bed for meals;Upright 30-60 min after meal    Other  Recommendations Oral Care Recommendations: Oral care QID Other Recommendations: Clarify dietary restrictions   Follow Up Recommendations  Inpatient Rehab    Frequency and Duration min 2x/week  2 weeks       SLP Swallow Goals Patient will consume recommended diet without observed clinical signs of aspiration with: Modified independent assistance Patient will utilize recommended strategies during swallow to increase swallowing safety with: Modified independent assistance   Swallow Study Prior Functional Status   Lived at home with spouse  Independent    General Date of Onset: 06/29/12 HPI: -year-old white female admitted with acute onset right parietal stroke with status epilepticus and intubated. Critical Care called for consultation. Type of Study: Bedside swallow evaluation Previous Swallow Assessment: Family denies prior reports of dysphagia  Diet Prior to this Study: NPO Temperature Spikes Noted: No Respiratory Status: Supplemental O2 delivered via (comment) (nasal cannula) History of Recent Intubation: Yes Length of Intubations (days): 1 days Date extubated: 07/01/12 Behavior/Cognition: Alert;Cooperative;Pleasant mood;Impulsive;Distractible;Decreased sustained attention Oral Cavity - Dentition: Adequate natural dentition Self-Feeding Abilities: Needs assist Patient Positioning: Upright in bed Baseline Vocal Quality: Hoarse;Low vocal intensity Volitional Cough: Strong Volitional Swallow: Able to elicit    Oral/Motor/Sensory Function Overall Oral Motor/Sensory Function: Appears within functional limits for tasks assessed Labial ROM: Reduced left Lingual ROM: Reduced left   Ice Chips Ice chips: Within functional limits Presentation: Cup;Spoon   Thin Liquid Thin Liquid: Within functional limits Presentation: Cup    Nectar Thick Nectar Thick Liquid: Not  tested   Honey Thick Honey Thick Liquid: Not tested   Puree Puree: Within functional limits   Solid  GO    Solid: Impaired Pharyngeal Phase Impairments: Throat Clearing - Delayed      Moreen Fowler MS, CCC-SLP [id number] [facility] 07/01/2012,3:46 PM

## 2012-07-01 NOTE — Progress Notes (Signed)
UR completed 

## 2012-07-01 NOTE — Progress Notes (Addendum)
Name: Melissa Knight MRN: [id number] DOB: 1933/05/23    LOS: 1  Referring Provider:  EDP Reason for Referral:  Seizure, VDRF, CVA  PULMONARY / CRITICAL CARE MEDICINE  HPI:  78 year old white female admitted with acute onset right parietal stroke with status epilepticus and intubated. Critical Care called for consultation. Past history is not fully available. The patient was last seen normal last night and went to bed and when she awoke this morning she had right gaze preference snoring respirations and left-sided weakness. Her husband called EMS who responded and 15 minutes and they brought the patient to [facility] emergency room whereupon the patient was intubated. The patient was in a seizure type state upon arrival. CT scan shows a right parietal infarct. Patient is hypertensive at this time. There is a past history of hypothyroidism and congestive failure and hypertension. We do not have a full past history on this patient.   Brief patient description:  78 year-old female with right parietal stroke and seizures on mechanical ventilatory support  Events Since Admission: CT scan of the head 06/30/2012 shows right parietal stroke 10-20 extubated  Current Status: Extubated and agitated  Vital Signs: Temp:  [95.2 F (35.1 C)-99.3 F (37.4 C)] 99.3 F (37.4 C) (10/20 0400) Pulse Rate:  [71-138] 125  (10/20 0800) Resp:  [14-27] 27  (10/20 0800) BP: (97-228)/(51-136) 126/102 mmHg (10/20 0800) SpO2:  [93 %-100 %] 95 % (10/20 0800) Arterial Line BP: (117-201)/(55-97) 124/68 mmHg (10/20 0700) FiO2 (%):  [40 %-100 %] 40 % (10/20 0715) Weight:  [81.647 kg (180 lb)-97.3 kg (214 lb 8.1 oz)] 97.3 kg (214 lb 8.1 oz) (10/20 0505)  Physical Examination: General:  Awake and agitated post extubation Neuro:  Mae x4. ? Left side weakness but difficult to evaluate HEENT:  No lesions Neck:  No jugular venous distention no thyromegaly no lymphadenopathy Cardiovascular:  Regular rate and rhythm normal  S1-S2 no S3 no S4 Lungs:  Distant breath sounds Abdomen:   Soft nontender no organomegaly Musculoskeletal:  Full range of motion Skin:  Clear  Principal Problem:  *CVA (cerebral infarction) Active Problems:  Seizure disorder, generalized convulsive, intractable  Acute respiratory failure  HTN (hypertension), malignant   ASSESSMENT AND PLAN  PULMONARY  Lab 06/30/12 1015  PHART 7.214*  PCO2ART 53.9*  PO2ART 354.0*  HCO3 21.7  O2SAT 100.0   Ventilator Settings: Vent Mode:  [-] PSV;CPAP FiO2 (%):  [40 %-100 %] 40 % Set Rate:  [14 bmp-16 bmp] 16 bmp Vt Set:  [500 mL-550 mL] 550 mL PEEP:  [5 cmH20] 5 cmH20 Pressure Support:  [10 cmH20] 10 cmH20 Plateau Pressure:  [8 cmH20-23 cmH20] 23 cmH20  ETT:  06/30/2012>>10-20  A:  Acute respiratory failure due to seizures with right parietal stroke(resolved 10-20) P:   -extubated 10-20  CARDIOVASCULAR  Lab 06/30/12 0920  TROPONINI <0.30  LATICACIDVEN --  PROBNP --   ECG:  Reviewed  Lines: 10-19 rt i j cvl  A: Hypertensive P:  Obtain echo Cardene drip as needed(off 10-20) RENAL  Lab 07/01/12 0532 06/30/12 0929 06/30/12 0920  NA 139 146* 146*  K 2.8* 3.5 --  CL 104 106 103  CO2 22 -- 22  BUN 11 13 12   CREATININE 0.67 0.90 0.88  CALCIUM 8.1* -- 9.9  MG -- -- --  PHOS -- -- --   Intake/Output      10/19 0701 - 10/20 0700 10/20 0701 - 10/21 0700   I.V. (mL/kg) 1667.3 (17.1) 53.1 (0.5)  IV Piggyback 225    Total Intake(mL/kg) 1892.3 (19.4) 53.1 (0.5)   Urine (mL/kg/hr) 1685 (0.7)    Total Output 1685    Net +207.3 +53.1         Foley:  In place  A:  No acute renal issues P:   Monitor Replete k GASTROINTESTINAL  Lab 06/30/12 0920  AST 26  ALT 18  ALKPHOS 71  BILITOT 0.6  PROT 7.5  ALBUMIN 3.8    A:  No GI issues P:   Monitor Swallow eval next 24 hours->10-21 HEMATOLOGIC  Lab 07/01/12 0532 06/30/12 0929 06/30/12 0920  HGB 14.4 17.3* 16.6*  HCT 41.4 51.0* 48.7*  PLT 147* -- 179  INR -- --  1.07  APTT -- -- 31   A:  No hematologic issues the patient was not given thrombolytics P:  Monitor  INFECTIOUS  Lab 07/01/12 0532 06/30/12 0920  WBC 13.9* 13.4*  PROCALCITON -- --   Cultures: None Antibiotics: None  A:  No infectious disease issues P:   Monitor  ENDOCRINE  Lab 07/01/12 0734 07/01/12 0410 06/30/12 2311 06/30/12 1938 06/30/12 1549  GLUCAP 132* 145* 151* 140* 105*   A:   Mild hyperglycemia   P:   Monitor CBGs  NEUROLOGIC  A:  Acute right parietal stroke with status epilepticus P:   Stroke admission orders set Followup CT scan and MRI Neurology consultation Load with IV Keppra and maintenance dose    BEST PRACTICE / DISPOSITION Level of Care:  ICU Primary Service:  PC CM Consultants:  Neurology Code Status:  Full Diet:  N.p.o. DVT Px:  SCDs GI Px:  Protonix  IV Skin Integrity:  Intact Social / Family:  Family updated at bedside    Billy Fischer, MD ; [facility] service Mobile [phone].  After 5:30 PM or weekends, call [phone]

## 2012-07-01 NOTE — Evaluation (Signed)
Physical Therapy Evaluation Patient Details Name: Melissa Knight MRN: [id number] DOB: [birth year] Today's Date: 07/01/2012 Time: 4098-1191 PT Time Calculation (min): 27 min  PT Assessment / Plan / Recommendation Clinical Impression  Pt admitted with left side weakness and slurred speech. scan of her head showed low density area involving the right parieto-occipital region, possibly a manifestation of subacute infarction. There was no sign of intracranial hemorrhage. On the way to CT, pt experienced seizure. Pt presenting with decreased cognition, mostly involving problem solving. Feel as though as pts cognition improves, there will be minimal physical impairments. Pt will benefit from skilled PT in the acute care seting in order to maximize functional mobility and safety prior to d/c home with husband    PT Assessment  Patient needs continued PT services    Follow Up Recommendations  Outpatient PT;Supervision for mobility/OOB          Equipment Recommendations  Other (comment) (TBD by PT)       Frequency Min 4X/week    Precautions / Restrictions Precautions Precautions: Fall Restrictions Weight Bearing Restrictions: No   Pertinent Vitals/Pain No pain      Mobility  Bed Mobility Bed Mobility: Supine to Sit;Sitting - Scoot to Edge of Bed Supine to Sit: 1: +2 Total assist;HOB flat Supine to Sit: Patient Percentage: 50% Sitting - Scoot to Edge of Bed: 2: Max assist Details for Bed Mobility Assistance: Max cueing for sequencing and assist to support trunk and LEs OOB Transfers Transfers: Sit to Stand;Stand to [employer] Transfers Sit to Stand: 4: Min assist;From bed;With upper extremity assist Stand to Sit: 4: Min assist;To chair/3-in-1;With armrests;With upper extremity assist Stand Pivot Transfers: 3: Mod assist Details for Transfer Assistance: VC for sequencing. Mod assist for safety as pt unable to problem solve how to get into chair from  bed. Ambulation/Gait Ambulation/Gait Assistance: Not tested (comment) Modified Rankin (Stroke Patients Only) Pre-Morbid Rankin Score: No symptoms Modified Rankin: Moderately severe disability           PT Diagnosis: Difficulty walking;Altered mental status  PT Problem List: Decreased activity tolerance;Decreased balance;Decreased mobility;Decreased cognition;Decreased knowledge of use of DME;Decreased safety awareness;Decreased knowledge of precautions PT Treatment Interventions: DME instruction;Gait training;Stair training;Functional mobility training;Therapeutic activities;Balance training;Neuromuscular re-education;Patient/family education;Cognitive remediation   PT Goals Acute Rehab PT Goals PT Goal Formulation: With patient Time For Goal Achievement: 07/15/12 Potential to Achieve Goals: Fair Pt will go Supine/Side to Sit: with modified independence PT Goal: Supine/Side to Sit - Progress: Goal set today Pt will go Sit to Supine/Side: with modified independence PT Goal: Sit to Supine/Side - Progress: Goal set today Pt will go Sit to Stand: with modified independence PT Goal: Sit to Stand - Progress: Goal set today Pt will go Stand to Sit: with modified independence PT Goal: Stand to Sit - Progress: Goal set today Pt will Transfer Bed to Chair/Chair to Bed: with supervision PT Transfer Goal: Bed to Chair/Chair to Bed - Progress: Goal set today Pt will Ambulate: >150 feet;with supervision;with least restrictive assistive device PT Goal: Ambulate - Progress: Goal set today Pt will Go Up / Down Stairs: 3-5 stairs;with supervision;with rail(s) PT Goal: Up/Down Stairs - Progress: Goal set today  Visit Information  Last PT Received On: 07/01/12 Assistance Needed: +2 (for safety with ambulation)    Subjective Data  Subjective: "this all feels like a dream" Patient Stated Goal: to go home   Prior Functioning  Home Living Lives With: Spouse Available Help at Discharge:  Family;Available 24 hours/day Type of Home:  House Home Access: Stairs to enter [employer] of Steps: 4 Entrance Stairs-Rails: Right;Left;Can reach both Home Layout: One level Bathroom Shower/Tub: Counselling psychologist: Yes How Accessible: Accessible via walker Home Adaptive Equipment: Straight cane Prior Function Level of Independence: Independent Able to Take Stairs?: Yes Driving: Yes Vocation: Retired Musician: No difficulties (soft spoken) Dominant Hand: Right    Cognition  Overall Cognitive Status: Appears within functional limits for tasks assessed/performed Area of Impairment: Following commands;Problem solving;Safety/judgement Arousal/Alertness: Awake/alert Orientation Level: Appears intact for tasks assessed Behavior During Session: [facility] for tasks performed Following Commands: Follows one step commands consistently;Follows one step commands with increased time;Follows multi-step commands inconsistently Safety/Judgement: Decreased safety judgement for tasks assessed Safety/Judgement - Other Comments: Pt reaching for items out of reach (table, end of bed) despite LOB trying to reach items. Problem Solving: Unable to figure out how to perform supine<>sit despite commands for sequencing.     Extremity/Trunk Assessment Right Upper Extremity Assessment RUE ROM/Strength/Tone: Unable to fully assess;Due to impaired cognition Left Upper Extremity Assessment LUE ROM/Strength/Tone: Unable to fully assess;Due to impaired cognition Right Lower Extremity Assessment RLE ROM/Strength/Tone: Within functional levels RLE Sensation: WFL - Light Touch Left Lower Extremity Assessment LLE ROM/Strength/Tone: Within functional levels LLE Sensation: WFL - Light Touch   Balance Balance Balance Assessed: Yes Static Sitting Balance Static Sitting - Balance Support: Right upper extremity supported;Left upper  extremity supported;Feet supported Static Sitting - Level of Assistance: 2: Max assist Static Sitting - Comment/# of Minutes: Pt attempting to reach for objects and therapist while sitting EOB. Continously reporting "I'm not sure how to do this. I'm going to fall." Required assist to maintain balance especially when attempting to reach for things.  End of Session PT - End of Session Equipment Utilized During Treatment: Gait belt Activity Tolerance: Patient tolerated treatment well Patient left: in chair;with call bell/phone within reach;with nursing in room Nurse Communication: Mobility status    Milana Kidney 07/01/2012, 4:29 PM  07/01/2012 Milana Kidney DPT PAGER: [phone] OFFICE: [phone]

## 2012-07-01 NOTE — Evaluation (Signed)
Occupational Therapy Evaluation Patient Details Name: Melissa Knight MRN: [id number] DOB: Mar 10, 1933 Today's Date: 07/01/2012 Time: 1478-2956 OT Time Calculation (min): 26 min  OT Assessment / Plan / Recommendation Clinical Impression  Pt admitted with left side weakness and slurred speech. scan of her head showed low density area involving the right parieto-occipital region, possibly a manifestation of subacute infarction. There was no sign of intracranial hemorrhage. On the way to CT, pt experienced seizure.   Will continue to benefit from acute OT services to address below problem list. Feel that pt will progress toward independent level and will recommend HHOT at this time..    OT Assessment  Patient needs continued OT Services    Follow Up Recommendations  Home health OT;Supervision/Assistance - 24 hour    Barriers to Discharge      Equipment Recommendations   (TBD by OT)    Recommendations for Other Services    Frequency  Min 2X/week    Precautions / Restrictions     Pertinent Vitals/Pain See vitals    ADL  Upper Body Dressing: Performed;Minimal assistance Where Assessed - Upper Body Dressing: Supported sitting Lower Body Dressing: Performed;+1 Total assistance Where Assessed - Lower Body Dressing: Supported sitting Toilet Transfer: Simulated;Moderate assistance Toilet Transfer Method: Stand pivot Toilet Transfer Equipment: Other (comment) (bed to chair) Equipment Used: Gait belt Transfers/Ambulation Related to ADLs: Mod assist for stand pivot transfer ADL Comments: Pt becomes slightly agitated when trying to perform tasks/commands presented by therapist.  While attempting to perform bed mobility, pt attempting to doff gown and socks.  When asked to sit EOB, pt became confused and reported "I don't know how to do that" while reaching for objects out of reach.    OT Diagnosis: Generalized weakness;Cognitive deficits  OT Problem List: Impaired balance (sitting and/or  standing);Decreased activity tolerance;Decreased cognition;Decreased safety awareness OT Treatment Interventions: Self-care/ADL training;Therapeutic activities;Cognitive remediation/compensation;Patient/family education;Balance training   OT Goals Acute Rehab OT Goals OT Goal Formulation: With patient Time For Goal Achievement: 07/15/12 Potential to Achieve Goals: Good ADL Goals Pt Will Perform Grooming: with modified independence;Standing at sink ADL Goal: Grooming - Progress: Goal set today Pt Will Perform Upper Body Bathing: with modified independence;Sitting, edge of bed;Sitting, chair;Unsupported ADL Goal: Upper Body Bathing - Progress: Goal set today Pt Will Perform Lower Body Bathing: with modified independence;Sit to stand from chair;Sit to stand from bed;Unsupported ADL Goal: Lower Body Bathing - Progress: Goal set today Pt Will Perform Upper Body Dressing: with modified independence;Sitting, chair;Sitting, bed;Unsupported ADL Goal: Upper Body Dressing - Progress: Goal set today Pt Will Perform Lower Body Dressing: with modified independence;Sit to stand from chair;Sit to stand from bed;Unsupported ADL Goal: Lower Body Dressing - Progress: Goal set today Pt Will Transfer to Toilet: with modified independence;Ambulation;Regular height toilet ADL Goal: Toilet Transfer - Progress: Goal set today Pt Will Perform Toileting - Clothing Manipulation: with modified independence;Standing ADL Goal: Toileting - Clothing Manipulation - Progress: Goal set today Pt Will Perform Toileting - Hygiene: with modified independence;Standing at 3-in-1/toilet;Sit to stand from 3-in-1/toilet ADL Goal: Toileting - Hygiene - Progress: Goal set today Miscellaneous OT Goals Miscellaneous OT Goal #1: Pt will perform bed mobility with mod I in prep for EOB ADLs. OT Goal: Miscellaneous Goal #1 - Progress: Goal set today  Visit Information  Last OT Received On: 07/01/12 Assistance Needed: +2 (for safety with  ambulation) PT/OT Co-Evaluation/Treatment: Yes    Subjective Data  Subjective: "I just feel like I am dreaming"   Prior Functioning  Vision/Perception Vision - Assessment Vision Assessment: Vision not tested Additional Comments: Not able to test vision at this time due to cognition. Will continue to assess   Cognition  Overall Cognitive Status: Impaired Area of Impairment: Following commands;Problem solving;Safety/judgement Arousal/Alertness: Awake/alert Orientation Level: Appears intact for tasks assessed Behavior During Session: Restless (during tasks.) Following Commands: Follows one step commands consistently;Follows one step commands with increased time;Follows multi-step commands inconsistently Safety/Judgement: Decreased safety judgement for tasks assessed Safety/Judgement - Other Comments: Pt reaching for items out of reach (table, end of bed) despite LOB trying to reach items. Problem Solving: Unable to figure out how to perform supine<>sit despite commands for sequencing.     Extremity/Trunk Assessment Right Upper Extremity Assessment RUE ROM/Strength/Tone: Unable to fully assess;Due to impaired cognition Left Upper Extremity Assessment LUE ROM/Strength/Tone: Unable to fully assess;Due to impaired cognition     Mobility Bed Mobility Bed Mobility: Supine to Sit;Sitting - Scoot to Edge of Bed Supine to Sit: 1: +2 Total assist;HOB flat Supine to Sit: Patient Percentage: 50% Sitting - Scoot to Edge of Bed: 2: Max assist Details for Bed Mobility Assistance: Max cueing for sequencing and assist to support trunk and LEs OOB Transfers Transfers: Sit to Stand;Stand to Sit Sit to Stand: 4: Min assist;From bed;With upper extremity assist Stand to Sit: 4: Min assist;To chair/3-in-1;With armrests;With upper extremity assist     Shoulder Instructions     Exercise     Balance Balance Balance Assessed: Yes Static Sitting Balance Static Sitting - Balance  Support: Right upper extremity supported;Left upper extremity supported;Feet supported Static Sitting - Level of Assistance: 2: Max assist Static Sitting - Comment/# of Minutes: Pt attempting to reach for objects and therapist while sitting EOB.  Continously reporting "I'm not sure how to do this. I'm going to fall."  Required assist to maintain balance especially when attempting to reach for things.   End of Session OT - End of Session Equipment Utilized During Treatment: Gait belt Activity Tolerance: Patient tolerated treatment well Patient left: in chair (at nursing station (maintenance in room)) Nurse Communication: Mobility status  GO    07/01/2012 Cipriano Mile OTR/L Pager [phone] Office [phone]  Cipriano Mile 07/01/2012, 3:13 PM

## 2012-07-01 NOTE — Progress Notes (Signed)
Subjective: Patient has been extubated and and has no complaints at this point. She's had no recurrence of seizure activity.  Objective: Current vital signs: BP 154/89  Pulse 102  Temp 99.5 F (37.5 C) (Oral)  Resp 17  Ht 5\' 5"  (1.651 m)  Wt 97.3 kg (214 lb 8.1 oz)  BMI 35.70 kg/m2  SpO2 98%  Neurologic Exam: Alert and in no acute distress. Oriented x3. No receptive or expressive aphasia. Follows commands well. Extraocular movements intact and normal. Mild left lower facial weakness. Moderate proximal and mild distal left upper extremity weakness; mild proximal left lower extremity weakness. Moderately impaired finger to nose testing with left upper extremity.  Medications:  Scheduled:   . antiseptic oral rinse  15 mL Mouth Rinse BID  . aspirin  325 mg Oral Daily  . levetiracetam  500 mg Intravenous Q12H  . pantoprazole (PROTONIX) IV  40 mg Intravenous Daily  . potassium chloride  10 mEq Intravenous Q1 Hr x 4  . rocuronium      . DISCONTD: antiseptic oral rinse  15 mL Mouth Rinse QID  . DISCONTD: aspirin  300 mg Rectal Daily  . DISCONTD: chlorhexidine  15 mL Mouth Rinse BID  . DISCONTD: potassium chloride  40 mEq Per Tube Q4H   Continuous:   . sodium chloride 50 mL/hr at 07/01/12 1706  . niCARDipine Stopped (07/01/12 0200)  . DISCONTD: propofol Stopped (07/01/12 0715)   FAO:[id number]  Assessment/Plan: 1. Acute right parietal ischemic infarction with left hemiparesis and coordination impairment. 2. Generalized seizures secondary to #1.  Recommendations: 1. Aspirin 325 mg per day 2. Continue seizure workup as outlined in initial neurology consultation recommendations. 3. Continue Keppra at 500 mg twice a day for seizure prevention. 4. EEG in the a.m.  C.R. Roseanne Reno, MD [facility] [phone]  07/01/2012  6:51 PM

## 2012-07-01 NOTE — Procedures (Signed)
Extubation Procedure Note  Patient Details:   Name: Melissa Knight DOB: 01-Nov-1932 MRN: [id number]   Airway Documentation:     Evaluation  O2 sats: stable throughout Complications: No apparent complications Patient did tolerate procedure well. Bilateral Breath Sounds: Diminished Suctioning: Airway Yes. Extubated per dr. Jarrett Ables and placed on 2l.    Penn Grissett V 07/01/2012, 8:07 AM

## 2012-07-02 ENCOUNTER — Inpatient Hospital Stay (HOSPITAL_COMMUNITY): Payer: Medicare Other

## 2012-07-02 ENCOUNTER — Encounter (HOSPITAL_COMMUNITY): Payer: Self-pay | Admitting: Cardiology

## 2012-07-02 DIAGNOSIS — I4891 Unspecified atrial fibrillation: Secondary | ICD-10-CM | POA: Diagnosis not present

## 2012-07-02 DIAGNOSIS — J984 Other disorders of lung: Secondary | ICD-10-CM | POA: Diagnosis not present

## 2012-07-02 DIAGNOSIS — R918 Other nonspecific abnormal finding of lung field: Secondary | ICD-10-CM | POA: Diagnosis not present

## 2012-07-02 DIAGNOSIS — R5383 Other fatigue: Secondary | ICD-10-CM | POA: Diagnosis not present

## 2012-07-02 DIAGNOSIS — J9600 Acute respiratory failure, unspecified whether with hypoxia or hypercapnia: Secondary | ICD-10-CM | POA: Diagnosis not present

## 2012-07-02 DIAGNOSIS — R930 Abnormal findings on diagnostic imaging of skull and head, not elsewhere classified: Secondary | ICD-10-CM | POA: Diagnosis not present

## 2012-07-02 DIAGNOSIS — I10 Essential (primary) hypertension: Secondary | ICD-10-CM | POA: Diagnosis not present

## 2012-07-02 DIAGNOSIS — I4821 Permanent atrial fibrillation: Secondary | ICD-10-CM | POA: Diagnosis not present

## 2012-07-02 DIAGNOSIS — G9340 Encephalopathy, unspecified: Secondary | ICD-10-CM | POA: Diagnosis not present

## 2012-07-02 DIAGNOSIS — I6350 Cerebral infarction due to unspecified occlusion or stenosis of unspecified cerebral artery: Secondary | ICD-10-CM | POA: Diagnosis not present

## 2012-07-02 DIAGNOSIS — G40319 Generalized idiopathic epilepsy and epileptic syndromes, intractable, without status epilepticus: Secondary | ICD-10-CM | POA: Diagnosis not present

## 2012-07-02 LAB — GLUCOSE, CAPILLARY
Glucose-Capillary: 111 mg/dL — ABNORMAL HIGH (ref 70–99)
Glucose-Capillary: 110 mg/dL — ABNORMAL HIGH (ref 70–99)
Glucose-Capillary: 101 mg/dL — ABNORMAL HIGH (ref 70–99)

## 2012-07-02 LAB — CBC
Hemoglobin: 13.5 g/dL (ref 12.0–15.0)
MCHC: 34.2 g/dL (ref 30.0–36.0)
RBC: 4.41 MIL/uL (ref 3.87–5.11)
WBC: 8.9 10*3/uL (ref 4.0–10.5)

## 2012-07-02 LAB — BASIC METABOLIC PANEL
GFR calc non Af Amer: 87 mL/min — ABNORMAL LOW (ref >90)
Glucose, Bld: 106 mg/dL — ABNORMAL HIGH (ref 70–99)
Potassium: 3.1 mEq/L — ABNORMAL LOW (ref 3.5–5.1)
Sodium: 140 mEq/L (ref 135–145)

## 2012-07-02 LAB — MAGNESIUM: Magnesium: 1.9 mg/dL (ref 1.5–2.5)

## 2012-07-02 MED ORDER — POTASSIUM CHLORIDE CRYS ER 20 MEQ PO TBCR
40.0000 meq | EXTENDED_RELEASE_TABLET | Freq: Once | ORAL | Status: AC
  Administered 2012-07-02: 40 meq via ORAL
  Filled 2012-07-02: qty 2

## 2012-07-02 MED ORDER — METOPROLOL TARTRATE 25 MG PO TABS
25.0000 mg | ORAL_TABLET | Freq: Two times a day (BID) | ORAL | Status: DC
  Administered 2012-07-02 – 2012-07-03 (×3): 25 mg via ORAL
  Filled 2012-07-02 (×5): qty 1

## 2012-07-02 MED ORDER — INFLUENZA VIRUS VACC SPLIT PF IM SUSP
0.5000 mL | INTRAMUSCULAR | Status: AC
  Administered 2012-07-03: 0.5 mL via INTRAMUSCULAR
  Filled 2012-07-02: qty 0.5

## 2012-07-02 MED ORDER — RIVAROXABAN 20 MG PO TABS
20.0000 mg | ORAL_TABLET | Freq: Every day | ORAL | Status: DC
  Administered 2012-07-03: 20 mg via ORAL
  Filled 2012-07-02: qty 1

## 2012-07-02 MED ORDER — ATORVASTATIN CALCIUM 10 MG PO TABS
10.0000 mg | ORAL_TABLET | Freq: Every day | ORAL | Status: DC
  Administered 2012-07-02 – 2012-07-03 (×2): 10 mg via ORAL
  Filled 2012-07-02 (×3): qty 1

## 2012-07-02 MED ORDER — LEVOTHYROXINE SODIUM 88 MCG PO TABS
88.0000 ug | ORAL_TABLET | Freq: Every day | ORAL | Status: DC
  Administered 2012-07-02 – 2012-07-04 (×3): 88 ug via ORAL
  Filled 2012-07-02 (×3): qty 1

## 2012-07-02 NOTE — Progress Notes (Signed)
Speech Language Pathology Dysphagia Treatment Patient Details Name: Melissa Knight MRN: [id number] DOB: 1933-02-20 Today's Date: 07/02/2012 Time: 0850-0900 SLP Time Calculation (min): 10 min  Assessment / Plan / Recommendation Clinical Impression  Pt. seen for dysphagia therapy and speech-language-cognitive evaluation (see SLE tab for documentation).  Pt. alert with sons at bedside.  Observed with cup and straw sips thin for possible upgrade to using straw.  No orpharyngeal s/s aspiration present.  Able to masticate small bite cracker with minimal transit delays.  Pt. complained of soreness on tongue and SLP observed a medium to large red edematous sore on left frontal area of tongue.  Pt. preferred to remain on Dys 2 texture due to lingual soreness.  Recommend continue Dys 2, thin liquids, attempt pills with liquid and continued S for safety, efficiency and diet texture upgrade when appropriate.     Diet Recommendation  Continue with Current Diet: Dysphagia 2 (fine chop);Thin liquid    SLP Plan Continue with current plan of care       Swallowing Goals  SLP Swallowing Goals Patient will consume recommended diet without observed clinical signs of aspiration with: Modified independent assistance Swallow Study Goal #1 - Progress: Progressing toward goal Patient will utilize recommended strategies during swallow to increase swallowing safety with: Modified independent assistance Swallow Study Goal #2 - Progress: Progressing toward goal  General Temperature Spikes Noted: No Respiratory Status: Supplemental O2 delivered via (comment) Behavior/Cognition: Alert;Cooperative;Pleasant mood Oral Cavity - Dentition: Adequate natural dentition Patient Positioning: Upright in bed  Oral Cavity - Oral Hygiene Does patient have any of the following "at risk" factors?: Oxygen therapy - cannula, mask, simple oxygen devices Brush patient's teeth BID with toothbrush (using toothpaste with fluoride):  Yes Patient is AT RISK - Oral Care Protocol followed (see row info): Yes   Dysphagia Treatment Treatment focused on: Skilled observation of diet tolerance;Patient/family/caregiver Dealer Educated:  (sons) Treatment Methods/Modalities: Skilled observation Patient observed directly with PO's: Yes Type of PO's observed: Dysphagia 3 (soft);Thin liquids Feeding: Able to feed self Liquids provided via: Cup;Straw Type of cueing: Verbal Amount of cueing: Minimal        Royce Macadamia M.Ed [employer] [phone]  07/02/2012

## 2012-07-02 NOTE — Progress Notes (Signed)
PT Cancellation Note  Patient Details Name: Melissa Knight MRN: [id number] DOB: Mar 12, 1933   Cancelled Treatment:    Reason Eval/Treat Not Completed: Patient at procedure or test/unavailable x 2 attempts today   Latyra Jaye 07/02/2012, 3:46 PM Pager [phone]

## 2012-07-02 NOTE — Progress Notes (Addendum)
Name: Melissa Knight MRN: [id number] DOB: November 28, 1932    LOS: 2  Referring Provider:  EDP Reason for Referral:  Seizure, VDRF, CVA  PULMONARY / CRITICAL CARE MEDICINE  HPI:  78 year old white female admitted with acute onset right parietal stroke with status epilepticus and intubated. Critical Care called for consultation.    Brief patient description:  78 year-old female with right parietal stroke and seizures on mechanical ventilatory support, extubated 10/20  Events Since Admission: CT scan of the head 06/30/2012 shows right parietal stroke 10-20 extubated  Current Status: No further seizure activity  Vital Signs: Temp:  [97.7 F (36.5 C)-99.5 F (37.5 C)] 98.1 F (36.7 C) (10/21 0700) Pulse Rate:  [88-120] 94  (10/21 0600) Resp:  [17-26] 20  (10/21 0600) BP: (116-157)/(67-97) 156/92 mmHg (10/21 0600) SpO2:  [91 %-99 %] 97 % (10/21 0600) Weight:  [95.5 kg (210 lb 8.6 oz)] 95.5 kg (210 lb 8.6 oz) (10/21 0600)  Intake/Output Summary (Last 24 hours) at 07/02/12 0840 Last data filed at 07/02/12 0700  Gross per 24 hour  Intake   1630 ml  Output   1490 ml  Net    140 ml    Physical Examination: General:  Awake, pleasant Neuro:  Mae x4. ? Left side weakness HEENT:  No lesions Neck:  No jugular venous distention no thyromegaly no lymphadenopathy Cardiovascular:  Regular rate and rhythm normal S1-S2 no S3 no S4 Lungs:  Distant breath sounds Abdomen:   Soft nontender no organomegaly Musculoskeletal:  Full range of motion Skin:  Clear  Principal Problem:  *CVA (cerebral infarction) Active Problems:  Seizure disorder, generalized convulsive, intractable  Acute respiratory failure  HTN (hypertension), malignant   ASSESSMENT AND PLAN  PULMONARY  Lab 06/30/12 1015  PHART 7.214*  PCO2ART 53.9*  PO2ART 354.0*  HCO3 21.7  O2SAT 100.0   Ventilator Settings:    ETT:  06/30/2012>>10-20  A:  Acute respiratory failure due to seizures with right parietal  stroke(resolved 10-20) P:   - extubated 10-20 - pulm toilet  CARDIOVASCULAR  Lab 07/01/12 0930 06/30/12 0920  TROPONINI -- <0.30  LATICACIDVEN -- --  PROBNP 1230.0* --   ECG:  Appears to be [employer]: 10-19 rt i j cvl TTE >> pending  A: Hypertensive ? Reported A fib from rhythm strips, not on presentation ECG P:  Cardene drip as needed (off 10-20) TTE results >>  Repeat ECG now, will need cardiology eval if indeed having A Fib >>   RENAL  Lab 07/02/12 0500 07/01/12 0532 06/30/12 0929 06/30/12 0920  NA 140 139 146* 146*  K 3.1* 2.8* -- --  CL 107 104 106 103  CO2 26 22 -- 22  BUN 6 11 13 12   CREATININE 0.57 0.67 0.90 0.88  CALCIUM 8.1* 8.1* -- 9.9  MG 1.9 -- -- --  PHOS 1.6* -- -- --   Intake/Output      10/20 0701 - 10/21 0700 10/21 0701 - 10/22 0700   P.O. 120    I.V. (mL/kg) 1153.1 (12.1)    IV Piggyback 410    Total Intake(mL/kg) 1683.1 (17.6)    Urine (mL/kg/hr) 1585 (0.7)    Total Output 1585    Net +98.1         Urine Occurrence 1 x     Foley:  In place  A:  Hypokalemia P:   Monitor Replete k  GASTROINTESTINAL  Lab 06/30/12 0920  AST 26  ALT 18  ALKPHOS 71  BILITOT 0.6  PROT 7.5  ALBUMIN 3.8    A:  No GI issues P:   Monitor On Dysphagia 2 diet, f/u Swallow eval next 24 hours->10-21  HEMATOLOGIC  Lab 07/02/12 0500 07/01/12 0532 06/30/12 0929 06/30/12 0920  HGB 13.5 14.4 17.3* 16.6*  HCT 39.5 41.4 51.0* 48.7*  PLT 128* 147* -- 179  INR -- -- -- 1.07  APTT -- -- -- 31   A:  No hematologic issues; the patient was not given thrombolytics P:  Monitor  INFECTIOUS  Lab 07/02/12 0500 07/01/12 0532 06/30/12 0920  WBC 8.9 13.9* 13.4*  PROCALCITON -- -- --   Cultures: None Antibiotics: None  A:  No infectious disease issues P:   Monitor  ENDOCRINE  Lab 07/01/12 2340 07/01/12 2022 07/01/12 1558 07/01/12 1131 07/01/12 0734  GLUCAP 102* 100* 111* 124* 132*   A:   Mild hyperglycemia   P:   Monitor  CBGs  NEUROLOGIC  A:  Acute right parietal stroke with seizures P:   Followup CT scan and MRI Neurology consultation Loaded with IV Keppra and maintenance dose    BEST PRACTICE / DISPOSITION Level of Care:  ICU >> to floor Primary Service:  PCCM >> to [facility] as of 10/22 Consultants:  Neurology Code Status:  Full Diet:  Dysphagia 2 DVT Px:  SCDs GI Px:  Protonix  IV Skin Integrity:  Intact Social / Family:  Family updated at bedside   Levy Pupa, MD, PhD 07/02/2012, 8:49 AM [facility] [phone] or if no answer [phone]

## 2012-07-02 NOTE — Progress Notes (Signed)
Stroke Team Progress Note  HISTORY Melissa Knight is an 78 y.o. female with a history of hypertension, congestive heart failure and thyroid disease, presenting with new onset left-sided weakness and speech difficulty as well as gaze preference to the left side. Patient was brought to the hospital in code stroke status 06/30/2012. On the way to CT scan she had a witnessed generalized seizure and was returned to the emergency room and subsequently intubated for airway protection. She was given Ativan 2 mg IV. CT scan of her head showed low density area involving the right parieto-occipital region, possibly a manifestation of subacute infarction. There was no sign of intracranial hemorrhage. NIH score could not be adequately assessed because the patient was intubated and had been given paralytic agents.  Patient was not a TPA candidate secondary to seizure at onset. She was admitted to the neuro ICU for further evaluation and treatment.  SUBJECTIVE Her husband and family members are at the bedside.  Overall she feels her condition is stable. She has no memory of the event. Family shared history.  OBJECTIVE Most recent Vital Signs: Filed Vitals:   07/02/12 0500 07/02/12 0600 07/02/12 0700 07/02/12 0800  BP: 147/82 156/92 163/91 161/102  Pulse: 88 94 90 91  Temp:   98.1 F (36.7 C)   TempSrc:   Oral   Resp: 17 20 19 22   Height:      Weight:  95.5 kg (210 lb 8.6 oz)    SpO2: 93% 97% 95% 95%   CBG (last 3)   Basename 07/01/12 2340 07/01/12 2022 07/01/12 1558  GLUCAP 102* 100* 111*    IV Fluid Intake:     . sodium chloride 50 mL/hr at 07/01/12 1706  . niCARDipine Stopped (07/01/12 0200)    MEDICATIONS    . aspirin  325 mg Oral Daily  . influenza  inactive virus vaccine  0.5 mL Intramuscular Tomorrow-1000  . levetiracetam  500 mg Intravenous Q12H  . pantoprazole (PROTONIX) IV  40 mg Intravenous Daily  . potassium chloride  10 mEq Intravenous Q1 Hr x 4  . potassium chloride  40 mEq Oral  Once  . DISCONTD: antiseptic oral rinse  15 mL Mouth Rinse QID  . DISCONTD: antiseptic oral rinse  15 mL Mouth Rinse BID  . DISCONTD: aspirin  300 mg Rectal Daily  . DISCONTD: chlorhexidine  15 mL Mouth Rinse BID  . DISCONTD: potassium chloride  40 mEq Per Tube Q4H   PRN:  metoprolol  Diet:  Dysphagia 2 thin liquids Activity:  Bedrest and out of bed and Up in chair DVT Prophylaxis:  SCDs   CLINICALLY SIGNIFICANT STUDIES Basic Metabolic Panel:  Lab 07/02/12 4332 07/01/12 0532  NA 140 139  K 3.1* 2.8*  CL 107 104  CO2 26 22  GLUCOSE 106* 142*  BUN 6 11  CREATININE 0.57 0.67  CALCIUM 8.1* 8.1*  MG 1.9 --  PHOS 1.6* --   Liver Function Tests:  Lab 06/30/12 0920  AST 26  ALT 18  ALKPHOS 71  BILITOT 0.6  PROT 7.5  ALBUMIN 3.8   CBC:  Lab 07/02/12 0500 07/01/12 0532 06/30/12 0920  WBC 8.9 13.9* --  NEUTROABS -- -- 5.6  HGB 13.5 14.4 --  HCT 39.5 41.4 --  MCV 89.6 87.2 --  PLT 128* 147* --   Coagulation:  Lab 06/30/12 0920  LABPROT 13.8  INR 1.07   Cardiac Enzymes:  Lab 06/30/12 0920  CKTOTAL --  CKMB --  CKMBINDEX --  TROPONINI <0.30   Urinalysis:  Lab 06/30/12 1137  COLORURINE YELLOW  LABSPEC 1.011  PHURINE 6.5  GLUCOSEU 100*  HGBUR SMALL*  BILIRUBINUR NEGATIVE  KETONESUR NEGATIVE  PROTEINUR NEGATIVE  UROBILINOGEN 0.2  NITRITE NEGATIVE  LEUKOCYTESUR NEGATIVE   Lipid Panel    Component Value Date/Time   CHOL 248* 07/01/2012 0532   TRIG 177* 07/01/2012 0532   HDL 57 07/01/2012 0532   CHOLHDL 4.4 07/01/2012 0532   VLDL 35 07/01/2012 0532   LDLCALC 156* 07/01/2012 0532   HgbA1C  Lab Results  Component Value Date   HGBA1C 6.1* 07/01/2012    Urine Drug Screen:     Component Value Date/Time   LABOPIA NONE DETECTED 06/30/2012 1138   COCAINSCRNUR NONE DETECTED 06/30/2012 1138   LABBENZ POSITIVE* 06/30/2012 1138   AMPHETMU NONE DETECTED 06/30/2012 1138   THCU NONE DETECTED 06/30/2012 1138   LABBARB NONE DETECTED 06/30/2012 1138    Alcohol  Level: No results found for this basename: ETH:2 in the last 168 hours  CT of the brain   07/01/2012   Right parietal area of hypodensity again demonstrated.  Acute infarct suspected.  No significant interval change.  No acute hemorrhage.    06/30/2012    1.  Right parietal lobe hypodensity on image 16 suspicious for subacute stroke, potentially in the posterior watershed distribution.  No hemorrhage identified. 2.  Patchy white matter hypodensities in the periventricular white matter, most likely chronic ischemic microvascular white matter disease. 3.  Old remote lacunar infarct of the head of the right caudate nucleus. 4.  Chronic maxillary sinusitis with mild chronic sphenoid and ethmoid sinusitis.   MRI of the brain    MRA of the brain    2D Echocardiogram    Carotid Doppler    CXR   07/02/2012   Interval extubation.  Right IJ catheter tip projects over the proximal to mid SVC.  Mild bibasilar opacities; atelectasis versus infiltrate.    07/01/2012  1.  Stable positioning of support apparatus.  No pneumothorax. 2.  Worsening right lower lung atelectasis. Unchanged left lower lung atelectasis    06/30/2012  Stable support apparatus.  Right IJ central line in place.  No diagnostic pneumothorax.   06/30/2012  Endotracheal tube in place with tip 2.4 cm above the carina.  No diagnostic pneumothorax.  NG tube in place. No acute infiltrate or pulmonary edema.  EKG  Preliminary atrial fibrillation   Therapy Recommendations PT - OP; OT - home health OP  Physical Exam  Pleasant elderly Caucasian lady currently not in distress.Awake alert. Afebrile. Head is nontraumatic. Neck is supple without bruit. Hearing is normal. Cardiac exam no murmur or gallop. Lungs are clear to auscultation. Distal pulses are well felt.  Neurological Exam : Awake  Alert oriented x 3. Normal speech and language.fundi were not visualized. Vision acuity appears normal. There appears to be dense left homonymous hemianopsia.eye  movements full without nystagmus. Face asymmetric with mild left lower face weakness. Tongue midline. Normal strength, tone, reflexes and coordination. Normal sensation. Gait deferred.   ASSESSMENT Melissa Knight is a 78 y.o. female presenting with left sided weakness, speech difficulty with left gaze preference in setting of acute seizure. Intubated on admission, now extubated. Imaging confirms a subacute right parietal infarct. Infarct felt to be embolic secondary to possible atrial fibrillation (present on initial EKG).  Work up underway. On no antiplatelets prior to admission. Now on aspirin 325 mg orally every day for secondary stroke prevention. Patient with no  resultant neuro deficits per exam.   HgbA1c 6.1 Hyperlipidemia, LDL 156, not on statin PTA, goal LDL < 100 Hypertension  Hospital day # 2  TREATMENT/PLAN  Continue aspirin 325 mg orally every day for secondary stroke prevention.  EKG to further assess for atrial fibrillation  Add lipitor  Check MRI, MRA, 2D, carotid doppler  Ok to transfer to the floor from neuro standpoint  Annie Main, MSN, RN, ANVP-BC, ANP-BC, GNP-BC [facility] Pager: [phone] 07/02/2012 9:22 AM  Scribe for Dr. Delia Heady, Stroke Center Medical Director, who has personally reviewed chart, pertinent data, examined the patient and developed the plan of care. Pager:  [phone]

## 2012-07-02 NOTE — Clinical Social Work Note (Signed)
Clinical Social Worker met with patient at bedside regarding a recent referral addressing advanced directives interest.  CSW spoke with patient who states that she lives at home with her husband and plans to return home at discharge with 24 hour assistance.  CSW spoke with patient about advanced directives in which patient kindly stated that her and her husband planned to see an attorney to address healthcare and financial concerns.  Per patient report, plan is to return home with family and no further social work needs identified.    Clinical Social Worker received referral.  Chart reviewed.  Awaiting PT/OT evaluations to determine patient appropriate needs at discharge.  Spoke with RN Case Manager who will follow up with patient to discuss home health needs if needed.    CSW signing off - please re consult if social work needs arise.  Macario Golds, [place] [phone]

## 2012-07-02 NOTE — Progress Notes (Signed)
Portable EEG completed

## 2012-07-02 NOTE — Consult Note (Signed)
CARDIOLOGY CONSULT NOTE  Patient ID: Melissa Knight, MRN: [id number], DOB/AGE: [id number] y.o. Admit date: 06/30/2012 Date of Consult: 07/02/2012  Primary Cardiologist: None  Chief Complaint: left sided weakness, speech difficulty Reason for Consultation: Atrial Fibrillation  HPI: 78 y.o. female w/ PMHx significant for HTN and Hypothyroidism who was admitted to [facility] on 06/30/2012 with acute right parietal ischemic stroke and found to have atrial fibrillation.  No prior cardiac history. Denies h/o MI, CHF, or DM. Had a stress test ~34yrs ago for dizziness that was reportedly normal. Reports h/o "skipped beats", but no diagnosed arrhythmias. Has had LE edema for the last couple of years for which her PCP put her on Lasix, but has never been told she has CHF. No h/o echo, cath, or holter monitor. On morning of presentation her husband found her in bed starring off with difficulty speaking and left sided weakness prompting him to call EMS. She was in her usual state of health the night prior when she went to sleep. Denies fever, chills, cough, chest pain, sob, orthopnea, palpitations, syncope, change in bladder/bowel habits, melena/hematochezia. No history of bleeding. Husband reports she snores and "breaths funny" at night.   In the ED, EKG showed Atrial Fibrillation 102bpm, nonspecific ST/T changes (no old for comparison). CXR was without acute cardiopulmonary findings. Labs were significant for normal troponin, glucose 168, K+ 3.3, Na 146, WBC 13.4, Hgb 16.6. On the way to CT scan she had a witnessed generalized seizure treated with Ativan and intubation. Head CT showed right parietal lobe hypodensity suspicious for subacute stroke, no evidence of intracranial hemorrhage. She was admitted by [facility] and evaluated by neurology. She has been placed on full dose ASA and statin. Cardiology is asked to evaluate her for newly diagnosed atrial fibrillation. Echo was completed on 10/19, results  not in EPIC. She is unaware of her irregular heart beat. Telemetry shows a.fib with rates 90-110s. SBP has ranged from 110-160s. She is currently being prepped for EEG. Carotid dopplers pending.  Past Medical History  Diagnosis Date  . Hypertension   . Hypothyroidism   . Atrial fibrillation 06/2012  . Acute ischemic stroke 06/2012    acute right parietal ischemic stroke  . Seizure 06/2012    in the setting of acute ischemic stroke     Surgical History:  Past Surgical History  Procedure Date  . Bladder surgery     bladder prolapse  . Tubal ligation    Home Meds: Medication Sig  furosemide (LASIX) 20 MG tablet Take 20 mg by mouth daily.  levothyroxine (SYNTHROID, LEVOTHROID) 88 MCG tablet Take 88 mcg by mouth daily.  metoprolol (LOPRESSOR) 50 MG tablet Take 50-100 mg by mouth 2 (two) times daily. Take 2 tablets in the morning and 1 tablet in the bedtime  niacin (NIASPAN) 500 MG CR tablet Take 500 mg by mouth at bedtime.    Inpatient Medications:   . aspirin  325 mg Oral Daily  . atorvastatin  10 mg Oral q1800  . influenza  inactive virus vaccine  0.5 mL Intramuscular Tomorrow-1000  . levetiracetam  500 mg Intravenous Q12H  . pantoprazole (PROTONIX) IV  40 mg Intravenous Daily  . potassium chloride  10 mEq Intravenous Q1 Hr x 4  . potassium chloride  40 mEq Oral Once   . sodium chloride 50 mL/hr at 07/01/12 1706  . niCARDipine Stopped (07/01/12 0200)    Allergies:  Allergies  Allergen Reactions  . Sulfur     History  Social History  . Marital Status: Married    Spouse Name: N/A    Number of Children: N/A  . Years of Education: N/A   Occupational History  . Not on file.   Social History Main Topics  . Smoking status: Former Smoker -- 1.0 packs/day    Types: Cigarettes    Quit date: 09/30/1978  . Smokeless tobacco: Never Used  . Alcohol Use: 0.6 oz/week    1 Glasses of wine per week  . Drug Use:   . Sexually Active:    Other Topics Concern  . Not on  file   Social History Narrative  . No narrative on file     Family History  Problem Relation Age of Onset  . Atrial fibrillation Mother     pacemaker  . Heart disease Sister     MI in 78s      Review of Systems: General: negative for chills, fever, night sweats or weight changes.  Cardiovascular: (+) occasional LE edema; negative for chest pain, shortness of breath, dyspnea on exertion, orthopnea, palpitations, or paroxysmal nocturnal dyspnea Dermatological: negative for rash Respiratory: negative for cough or wheezing Urologic: negative for hematuria Abdominal: negative for nausea, vomiting, diarrhea, bright red blood per rectum, melena, or hematemesis Neurologic: As per HPI All other systems reviewed and are otherwise negative except as noted above.  Labs:  [facility] 06/30/12 0920  TROPONINI <0.30   Component Value Date   WBC 8.9 07/02/2012   HGB 13.5 07/02/2012   HCT 39.5 07/02/2012   MCV 89.6 07/02/2012   PLT 128* 07/02/2012    Lab 07/02/12 0500 06/30/12 0920  NA 140 --  K 3.1* --  CL 107 --  CO2 26 --  BUN 6 --  CREATININE 0.57 --  CALCIUM 8.1* --  PROT -- 7.5  BILITOT -- 0.6  ALKPHOS -- 71  ALT -- 18  AST -- 26  GLUCOSE 106* --  MAGNESIUM 1.9    Component Value Date   CHOL 248* 07/01/2012   HDL 57 07/01/2012   LDLCALC 156* 07/01/2012   TRIG 177* 07/01/2012     07/01/2012 09:30  Pro B Natriuretic peptide (BNP) 1230.0 (H)    07/01/2012 05:32  Hemoglobin A1C 6.1 (H)    Radiology/Studies:   07/01/2012 -  CT HEAD WITHOUT CONTRAST   Findings: Technically limited study due to motion artifact.  Some images repeated for better visualization.  Right parietal lobe focus of hypodensity extending to the cortical surface is again demonstrated, similar to previous study.  Right parietal infarct is suspected.  Diffuse cerebral atrophy.  Low attenuation change in the deep white matter consistent with small vessel ischemic change. Ventricles are not dilated.  No  mass effect or midline shift.  No abnormal extra-axial fluid collections.  Gray-white matter junctions are distinct.  Basal cisterns are not effaced.  No evidence of acute intracranial hemorrhage.  No significant changes since previous study.  IMPRESSION: Right parietal area of hypodensity again demonstrated.  Acute infarct suspected.  No significant interval change.  No acute hemorrhage.   06/30/2012 -  CT HEAD WITHOUT CONTRAST   Findings: The brain stem, cerebral peduncles, thalami, ventricular system, and basilar cisterns appear unremarkable.  Remote lacunar infarct in the head of the right caudate nucleus noted.  Abnormal hypodensity noted in the right parietal lobe.  To involve cortex and white matter, suspicious for cytotoxic edema.  Patchy white matter hypodensities are present in the periventricular white matter  No intracranial hemorrhage observed. No  definite mass lesion. Polypoid mucoperiosteal thickening noted in the right maxillary sinus, with mild mucosal thickening in the left maxillary sinus. Minimal sphenoid and ethmoid chronic sinusitis.  IMPRESSION:  1.  Right parietal lobe hypodensity on image 16 suspicious for subacute stroke, potentially in the posterior watershed distribution.  No hemorrhage identified. 2.  Patchy white matter hypodensities in the periventricular white matter, most likely chronic ischemic microvascular white matter disease. 3.  Old remote lacunar infarct of the head of the right caudate nucleus. 4.  Chronic maxillary sinusitis with mild chronic sphenoid and ethmoid sinusitis.    07/02/2012  -  PORTABLE CHEST - 1 VIEW   Findings: Interval extubation.  Right IJ catheter tip projects over the proximal to mid SVC, similar to prior.  Mild cardiomegaly. Aortic arch atherosclerosis.  Mild bibasilar opacities.  No definite pleural effusion.  No pneumothorax.  Calcification projecting over the right mid lung is likely a granuloma.  IMPRESSION: Interval extubation.  Right IJ  catheter tip projects over the proximal to mid SVC.  Mild bibasilar opacities; atelectasis versus infiltrate.      EKG: 06/30/12 @ 0923 - Atrial Fibrillation 102bpm, nonspecific ST changes (no old for comparison)  07/02/12 @ - Atrial Fibrillation 91bpm, nonspecific anterolateral TWIs  Physical Exam: Blood pressure 148/85, pulse 91, temperature 98.1 F (36.7 C), temperature source Oral, resp. rate 19, height 5\' 5"  (1.651 m), weight 210 lb 8.6 oz (95.5 kg), SpO2 96.00%. General: Overweight elderly white female in no acute distress. Head: Normocephalic, atraumatic, sclera non-icteric, no xanthomas, nares are without discharge.  Neck: Supple. No JVD. Lungs: Clear bilaterally to auscultation without wheezes, rales, or rhonchi. Breathing is unlabored. Heart: Irregularly irregular with S1 S2. No murmurs, rubs, or gallops appreciated. Abdomen: Soft, non-tender, non-distended with normoactive bowel sounds.  No rebound/guarding. No obvious abdominal masses. Msk:  Strength and tone appear normal for age. Extremities: No clubbing or cyanosis. No edema.  Distal pedal pulses are intact and equal bilaterally. Neuro: Alert and oriented X 3. Moves all extremities spontaneously. (+) Left sided weakness Psych:  Responds to questions appropriately with a normal affect.   Assessment and Plan:  78 y.o. female w/ PMHx significant for HTN and Hypothyroidism who was admitted to [facility] on 06/30/2012 with acute right parietal ischemic stroke and found to have atrial fibrillation.  1. Acute right parietal ischemic stroke: Presented with left sided weakness and speech difficulty with imaging showing right parietal ischemic stroke, no signs of intracranial hemorrhage. Likely cardioembolic in etiology with newly diagnosed atrial fibrillation. Management per primary team and neurology  2. Generalized seizures: Secondary to #1. No further seizures since presentation. EEG pending. Management per primary team and  neurology  3. Atrial Fibrillation: Newly diagnosed this admission. Onset unclear as she is asymptomatic. Rates 90-110s without rate controlling meds. Will resume lopressor 25mg  bid. CHADS2 score 4 (HTN, Age, Stroke). Currently on full dose ASA. Will need chronic anticoagulation for secondary stroke prevention. No h/o bleeding. Stop ASA and start Xarelto 20mg  with first dose tomorrow. Crt Cl >100. Cont statin. Will review echo to assess LV function and for valvular abnormalities.  4. Hypokalemia: K+ 3.1. Mg 1.9. Receiving supplementation. Cont to monitor and supplement as needed.  5. Hypertension: SBPs 110-160s. Resume metoprolol as above.  6. ?OSA: May benefit from outpatient sleep study    Signed, HOPE, JESSICA PA-C 07/02/2012, 11:11 AM   Patient seen, examined. Available data reviewed. Agree with findings, assessment, and plan as outlined by [facility], PA-C. The patient  is known to me (I have followed her husband for several years). She has newly diagnosed atrial fibrillation of unknown duration and presented with an acute ischemic stroke. Most likely etiology of her stroke is cardioembolic in setting of AF. I have reviewed all available data. Her exam demonstrates an irregularly irregular heart rhythm without murmur or gallop. Lung fields are clear and there is no peripheral edema. EKG shows atrial fibrillation. Echo shows normal LV function. I have discussed her case with neurology (Dr Pearlean Brownie), and plan for anticoagulation with Xarelto for secondary prevention of thromboembolism/stroke. Will start Xarelto tomorrow am and d/c ASA at that time. Discussed plan with patient and her husband. She is on appropriate risk reduction measures with a statin drug. Will add back metoprolol 25 mg BID and titrate to control heart rate which is mildly elevated. Will follow with you, thx.  Tonny Bollman, M.D. 07/02/2012 12:11 PM

## 2012-07-02 NOTE — Progress Notes (Signed)
*  PRELIMINARY RESULTS* Vascular Ultrasound Carotid Duplex (Doppler) has been completed.   Technically difficult with limited visualization of right sided anatomy due to surgical dressing. There is no obvious evidence of hemodynamically significant internal carotid artery stenosis. Vertebral arteries are patent with antegrade flow.  07/02/2012 2:42 PM Gertie Fey, RDMS, RDCS

## 2012-07-02 NOTE — Evaluation (Signed)
Speech Language Pathology Evaluation Patient Details Name: Pranavi Aure MRN: [id number] DOB: 01/21/1933 Today's Date: 07/02/2012 Time: 0900-0910 SLP Time Calculation (min): 10 min  Problem List:  Patient Active Problem List  Diagnosis  . CVA (cerebral infarction)  . Seizure disorder, generalized convulsive, intractable  . Acute respiratory failure  . HTN (hypertension), malignant  . Atrial fibrillation   Past Medical History:  Past Medical History  Diagnosis Date  . Hypertension   . Hypothyroidism   . Atrial fibrillation 06/2012  . Acute ischemic stroke 06/2012    acute right parietal ischemic stroke  . Seizure 06/2012    in the setting of acute ischemic stroke   Past Surgical History:  Past Surgical History  Procedure Date  . Bladder surgery     bladder prolapse  . Tubal ligation    HPI:  78 year old white female admitted with acute onset right parietal stroke with status epilepticus and intubated. Critical Care called for consultation. Past history: Hypertension, CHF, thyroid disease.   Assessment / Plan / Recommendation Clinical Impression  Pt.'s speech-language-cognition appear WFL's during basic assessment including environment/wants/needs/ineraction with staff and family.  Pt. and family state pt.'s speech and cognition appear typical/at baseline.  ST will follow for dysphagia and recommend a more in depth assessment for higher/complex cognition at next venue of care.     SLP Assessment  Patient does not need any further Speech Lanaguage Pathology Services    Follow Up Recommendations  Inpatient Rehab                  SLP Evaluation Prior Functioning  Cognitive/Linguistic Baseline: Within functional limits Type of Home: House Lives With: Spouse Available Help at Discharge: Family;Available 24 hours/day Vocation: Retired IT trainer)   Cognition  Overall Cognitive Status: Appears within functional limits for tasks assessed Arousal/Alertness:  Awake/alert Orientation Level: Oriented X4 Attention: Selective Selective Attention: Appears intact Memory: Appears intact Awareness: Appears intact Problem Solving: Appears intact Safety/Judgment: Appears intact    Comprehension  Auditory Comprehension Overall Auditory Comprehension: Appears within functional limits for tasks assessed Yes/No Questions: Not tested Commands: Within Functional Limits Conversation: Simple Visual Recognition/Discrimination Discrimination: Not tested Reading Comprehension Reading Status: Not tested    Expression Expression Primary Mode of Expression: Verbal Verbal Expression Overall Verbal Expression: Appears within functional limits for tasks assessed Initiation: No impairment Level of Generative/Spontaneous Verbalization: Conversation Naming: Not tested Pragmatics: No impairment Non-Verbal Means of Communication: Not applicable Written Expression Dominant Hand: Right Written Expression: Not tested   Oral / Motor Oral Motor/Sensory Function Overall Oral Motor/Sensory Function: Appears within functional limits for tasks assessed Labial ROM: Reduced left Lingual ROM: Reduced left Motor Speech Overall Motor Speech: Appears within functional limits for tasks assessed Respiration: Within functional limits Phonation: Hoarse Resonance: Within functional limits Articulation: Within functional limitis Intelligibility: Intelligible Motor Planning: Witnin functional limits        [employer] M.Ed [employer] [phone]  07/02/2012

## 2012-07-03 DIAGNOSIS — I6350 Cerebral infarction due to unspecified occlusion or stenosis of unspecified cerebral artery: Secondary | ICD-10-CM | POA: Diagnosis not present

## 2012-07-03 DIAGNOSIS — J9600 Acute respiratory failure, unspecified whether with hypoxia or hypercapnia: Secondary | ICD-10-CM | POA: Diagnosis not present

## 2012-07-03 DIAGNOSIS — I4891 Unspecified atrial fibrillation: Secondary | ICD-10-CM | POA: Diagnosis not present

## 2012-07-03 DIAGNOSIS — G40319 Generalized idiopathic epilepsy and epileptic syndromes, intractable, without status epilepticus: Secondary | ICD-10-CM

## 2012-07-03 DIAGNOSIS — I10 Essential (primary) hypertension: Secondary | ICD-10-CM

## 2012-07-03 LAB — GLUCOSE, CAPILLARY
Glucose-Capillary: 105 mg/dL — ABNORMAL HIGH (ref 70–99)
Glucose-Capillary: 116 mg/dL — ABNORMAL HIGH (ref 70–99)
Glucose-Capillary: 95 mg/dL (ref 70–99)
Glucose-Capillary: 105 mg/dL — ABNORMAL HIGH (ref 70–99)

## 2012-07-03 LAB — URINE CULTURE: Colony Count: 75000

## 2012-07-03 LAB — BASIC METABOLIC PANEL
CO2: 26 mEq/L (ref 19–32)
Calcium: 9 mg/dL (ref 8.4–10.5)
Creatinine, Ser: 0.61 mg/dL (ref 0.50–1.10)
GFR calc non Af Amer: 85 mL/min — ABNORMAL LOW (ref >90)
Sodium: 140 mEq/L (ref 135–145)

## 2012-07-03 LAB — CBC
Hemoglobin: 14.6 g/dL (ref 12.0–15.0)
MCH: 30 pg (ref 26.0–34.0)
MCHC: 34 g/dL (ref 30.0–36.0)
RDW: 13.3 % (ref 11.5–15.5)

## 2012-07-03 LAB — MAGNESIUM: Magnesium: 1.8 mg/dL (ref 1.5–2.5)

## 2012-07-03 LAB — PHOSPHORUS: Phosphorus: 2.4 mg/dL (ref 2.3–4.6)

## 2012-07-03 MED ORDER — POTASSIUM CHLORIDE CRYS ER 20 MEQ PO TBCR
40.0000 meq | EXTENDED_RELEASE_TABLET | Freq: Every day | ORAL | Status: DC
  Administered 2012-07-04: 40 meq via ORAL
  Filled 2012-07-03: qty 2

## 2012-07-03 MED ORDER — METOPROLOL TARTRATE 50 MG PO TABS
50.0000 mg | ORAL_TABLET | Freq: Two times a day (BID) | ORAL | Status: DC
Start: 2012-07-03 — End: 2012-07-04
  Administered 2012-07-03 – 2012-07-04 (×2): 50 mg via ORAL
  Filled 2012-07-03 (×3): qty 1

## 2012-07-03 MED ORDER — POTASSIUM CHLORIDE CRYS ER 20 MEQ PO TBCR
40.0000 meq | EXTENDED_RELEASE_TABLET | Freq: Once | ORAL | Status: AC
  Administered 2012-07-03: 40 meq via ORAL
  Filled 2012-07-03: qty 2

## 2012-07-03 MED ORDER — RIVAROXABAN 20 MG PO TABS
20.0000 mg | ORAL_TABLET | Freq: Every day | ORAL | Status: DC
  Filled 2012-07-03: qty 1

## 2012-07-03 MED ORDER — PANTOPRAZOLE SODIUM 40 MG PO TBEC
40.0000 mg | DELAYED_RELEASE_TABLET | Freq: Every day | ORAL | Status: DC
  Administered 2012-07-04: 40 mg via ORAL
  Filled 2012-07-03: qty 1

## 2012-07-03 MED ORDER — CEFUROXIME AXETIL 500 MG PO TABS
500.0000 mg | ORAL_TABLET | Freq: Two times a day (BID) | ORAL | Status: DC
  Administered 2012-07-04: 500 mg via ORAL
  Filled 2012-07-03 (×4): qty 1

## 2012-07-03 MED ORDER — LEVETIRACETAM 500 MG PO TABS
500.0000 mg | ORAL_TABLET | Freq: Two times a day (BID) | ORAL | Status: DC
  Administered 2012-07-03 – 2012-07-04 (×2): 500 mg via ORAL
  Filled 2012-07-03 (×3): qty 1

## 2012-07-03 NOTE — Progress Notes (Signed)
[facility] PROGRESS NOTE  Cindi Carbon WUJ:[id number] DOB: 25-Oct-1932 DOA: 06/30/2012 PCP: No primary provider on file.   History of presenting illness  Melissa Knight is an 78 y.o. female with a history of hypertension, congestive heart failure and thyroid disease, presenting with new onset left-sided weakness and speech difficulty as well as gaze preference to the left side. Patient was brought to the hospital in code stroke status 06/30/2012. On the way to CT scan she had a witnessed generalized seizure and was returned to the emergency room and subsequently intubated for airway protection. She was given Ativan 2 mg IV. CT scan of her head showed low density area involving the right parieto-occipital region, possibly a manifestation of subacute infarction. There was no sign of intracranial hemorrhage. NIH score could not be adequately assessed because the patient was intubated and had been given paralytic agents.   Assessment/Plan: 1. Seizure with associated left-sided Todd's paralysis, precipitated by subacute stroke: Changed by mouth. Improving left extremity strength. Neurology following 2. Subacute right parietal infarct: Likely embolic secondary to new-onset atrial fibrillation. Patient on Xarelto for secondary stroke prevention. Lipitor started. Neurology following. 3. Hypertension: Mildly uncontrolled. Increase beta blockers 4. Atrial fibrillation with controlled ventricular rate.Xarelto for anticoagulation. Continue beta blockers. Cardiology following. 5. Hyperlipidemia: LDL 156. Continue statin. 6. Hypokalemia: Replete and follow. 7. Thrombocytopenia: Stable 8. Status post VDRF: Secondary to seizures. Respiratory status stable. 9. Leukocytosis/possible Escherichia coli UTI: By mouth Ceftin.  Code Status: Full Family Communication: Discussed with patient's son at bedside Disposition Plan: Home   Consultants:  Neurology  Cardiology  Pulmonary critical  care  Procedures:  CT of brain  MRI/MRA of brain  2-D echocardiogram  Chest x-ray  EEG  Antibiotics:  None  HPI/Subjective: Feels much better. Improving strength left upper extremity.  Objective: Filed Vitals:   07/03/12 0115 07/03/12 0638 07/03/12 1031 07/03/12 1736  BP: 168/94 154/103 146/102 151/86  Pulse: 88 85 96 86  Temp: 98.7 F (37.1 C) 98.6 F (37 C) 99 F (37.2 C) 98.1 F (36.7 C)  TempSrc: Oral Oral    Resp: 18 18 20 20   Height:      Weight:      SpO2: 98% 98% 97% 98%    Intake/Output Summary (Last 24 hours) at 07/03/12 1909 Last data filed at 07/03/12 1737  Gross per 24 hour  Intake    240 ml  Output      0 ml  Net    240 ml   Filed Weights   06/30/12 1330 07/01/12 0505 07/02/12 0600  Weight: 95.4 kg (210 lb 5.1 oz) 97.3 kg (214 lb 8.1 oz) 95.5 kg (210 lb 8.6 oz)    Exam:   General exam: Pleasant and comfortable.  Respiratory system: Clear. No increased work of breathing.  Cardiovascular system: S1-S2 heard, irregular. No JVD or murmurs. Telemetry shows rate controlled A. fib.  Gastrointestinal system: Abdomen is nondistended, soft and nontender. Normal bowel sounds heard.  Central nervous system: Alert and oriented. No focal neurological deficits.  Extremities: Left upper extremity grade 4 x 5 power. Rest of limbs 5 x 5 power.   Data Reviewed: Basic Metabolic Panel:  Lab 07/03/12 9562 07/02/12 0500 07/01/12 0532 06/30/12 0929 06/30/12 0920  NA 140 140 139 146* 146*  K 3.2* 3.1* 2.8* 3.5 3.3*  CL 101 107 104 106 103  CO2 26 26 22  -- 22  GLUCOSE 108* 106* 142* 157* 168*  BUN 7 6 11 13 12   CREATININE 0.61 0.57  0.67 0.90 0.88  CALCIUM 9.0 8.1* 8.1* -- 9.9  MG 1.8 1.9 -- -- --  PHOS 2.4 1.6* -- -- --   Liver Function Tests:  Lab 06/30/12 0920  AST 26  ALT 18  ALKPHOS 71  BILITOT 0.6  PROT 7.5  ALBUMIN 3.8   No results found for this basename: LIPASE:5,AMYLASE:5 in the last 168 hours No results found for this basename:  AMMONIA:5 in the last 168 hours CBC:  Lab 07/03/12 0600 07/02/12 0500 07/01/12 0532 06/30/12 0929 06/30/12 0920  WBC 16.4* 8.9 13.9* -- 13.4*  NEUTROABS -- -- -- -- 5.6  HGB 14.6 13.5 14.4 17.3* 16.6*  HCT 43.0 39.5 41.4 51.0* 48.7*  MCV 88.5 89.6 87.2 -- 92.1  PLT 143* 128* 147* -- 179   Cardiac Enzymes:  Lab 06/30/12 0920  CKTOTAL --  CKMB --  CKMBINDEX --  TROPONINI <0.30   BNP (last 3 results)  Basename 07/01/12 0930  PROBNP 1230.0*   CBG:  Lab 07/03/12 1649 07/03/12 1149 07/03/12 0640 07/02/12 2202 07/02/12 1616  GLUCAP 95 116* 105* 121* 101*    Recent Results (from the past 240 hour(s))  URINE CULTURE     Status: Normal   Collection Time   06/30/12 11:38 AM      Component Value Range Status Comment   Specimen Description URINE, CATHETERIZED   Final    Special Requests NONE   Final    Culture  Setup Time 06/30/2012 19:06   Final    Colony Count 75,000 COLONIES/ML   Final    Culture ESCHERICHIA COLI   Final    Report Status 07/03/2012 FINAL   Final    Organism ID, Bacteria ESCHERICHIA COLI   Final   MRSA PCR SCREENING     Status: Normal   Collection Time   06/30/12  1:31 PM      Component Value Range Status Comment   MRSA by PCR NEGATIVE  NEGATIVE Final      Studies:  CT of the brain  07/01/2012 Right parietal area of hypodensity again demonstrated. Acute infarct suspected. No significant interval change. No acute hemorrhage.   06/30/2012 1. Right parietal lobe hypodensity on image 16 suspicious for subacute stroke, potentially in the posterior watershed distribution. No hemorrhage identified. 2. Patchy white matter hypodensities in the periventricular white matter, most likely chronic ischemic microvascular white matter disease. 3. Old remote lacunar infarct of the head of the right caudate nucleus. 4. Chronic maxillary sinusitis with mild chronic sphenoid and ethmoid sinusitis.   MRI/MRA of the brain Remote infarct with encephalomalacia posterior right  temporal - parietal junction. Remote small right caudate head infarct. Tiny infarcts scattered throughout the centrum semiovale/corona radiata greater on the right.Intracranial atherosclerotic type changes.   2D Echocardiogram EF 60%, systolic fxn nl, normal atrium.   Carotid Doppler There is no obvious evidence of hemodynamically significant internal carotid artery stenosis   CXR  07/02/2012 Interval extubation. Right IJ catheter tip projects over the proximal to mid SVC. Mild bibasilar opacities; atelectasis versus infiltrate.  07/01/2012 1. Stable positioning of support apparatus. No pneumothorax. 2. Worsening right lower lung atelectasis. Unchanged left lower lung atelectasis  06/30/2012 Stable support apparatus. Right IJ central line in place. No diagnostic pneumothorax.  06/30/2012 Endotracheal tube in place with tip 2.4 cm above the carina. No diagnostic pneumothorax. NG tube in place. No acute infiltrate or pulmonary edema.     Scheduled Meds:    . atorvastatin  10 mg Oral q1800  .  influenza  inactive virus vaccine  0.5 mL Intramuscular Tomorrow-1000  . levetiracetam  500 mg Intravenous Q12H  . levothyroxine  88 mcg Oral Daily  . metoprolol  50 mg Oral BID  . pantoprazole (PROTONIX) IV  40 mg Intravenous Daily  . potassium chloride  40 mEq Oral Once  . potassium chloride  40 mEq Oral Once  . potassium chloride  40 mEq Oral Daily  . rivaroxaban  20 mg Oral Q supper  . DISCONTD: metoprolol  25 mg Oral BID  . DISCONTD: rivaroxaban  20 mg Oral Daily   Continuous Infusions:    . sodium chloride 50 mL/hr at 07/01/12 1706    Principal Problem:  *CVA (cerebral infarction) Active Problems:  Seizure disorder, generalized convulsive, intractable  Acute respiratory failure  HTN (hypertension), malignant  Atrial fibrillation        [facility]  [facility] Pager [phone]. If 8PM-8AM, please contact night-coverage at [email or web address], password [facility] 07/03/2012,  7:09 PM  LOS: 3 days

## 2012-07-03 NOTE — Progress Notes (Signed)
Speech Language Pathology Discharge Patient Details Name: Melissa Knight MRN: [id number] DOB: 12/26/1932 Today's Date: 07/03/2012 Time: 6962-9528 SLP Time Calculation (min): 10 min  Patient discharged from SLP services secondary to goals met and no further SLP needs identified.  Please see latest therapy progress note for current level of functioning and progress toward goals.    Progress and discharge plan discussed with patient and/or caregiver: Patient/Caregiver agrees with plan  GO     Royce Macadamia 07/03/2012, 2:12 PM

## 2012-07-03 NOTE — Progress Notes (Signed)
Patient Name: Melissa Knight Date of Encounter: 07/03/2012   Principal Problem:  *CVA (cerebral infarction) Active Problems:  Seizure disorder, generalized convulsive, intractable  Acute respiratory failure  HTN (hypertension), malignant  Atrial fibrillation   SUBJECTIVE  No c/p, sob, palpitations.  Walked some.  Still with left arm wkns.  CURRENT MEDS    . atorvastatin  10 mg Oral q1800  . influenza  inactive virus vaccine  0.5 mL Intramuscular Tomorrow-1000  . levetiracetam  500 mg Intravenous Q12H  . levothyroxine  88 mcg Oral Daily  . metoprolol  25 mg Oral BID  . pantoprazole (PROTONIX) IV  40 mg Intravenous Daily  . rivaroxaban  20 mg Oral Daily  . DISCONTD: aspirin  325 mg Oral Daily   OBJECTIVE  Filed Vitals:   07/02/12 1600 07/02/12 2203 07/03/12 0115 07/03/12 0638  BP: 167/91 190/105 168/94 154/103  Pulse: 86 88 88 85  Temp: 97.3 F (36.3 C) 98.9 F (37.2 C) 98.7 F (37.1 C) 98.6 F (37 C)  TempSrc: Oral Oral Oral Oral  Resp: 22 18 18 18   Height:      Weight:      SpO2: 97% 97% 98% 98%    Intake/Output Summary (Last 24 hours) at 07/03/12 1001 Last data filed at 07/02/12 1700  Gross per 24 hour  Intake    455 ml  Output      0 ml  Net    455 ml   Filed Weights   06/30/12 1330 07/01/12 0505 07/02/12 0600  Weight: 210 lb 5.1 oz (95.4 kg) 214 lb 8.1 oz (97.3 kg) 210 lb 8.6 oz (95.5 kg)   PHYSICAL EXAM  General: Pleasant, NAD. Neuro: Alert and oriented X 3. Moves all extremities spontaneously. Psych: Normal affect. HEENT:  Normal  Neck: Supple without bruits or JVD. Lungs:  Resp regular and unlabored, CTA. Heart: IR, IR no s3, s4, or murmurs. Abdomen: Soft, non-tender, non-distended, BS + x 4.  Extremities: No clubbing, cyanosis or edema. DP/PT/Radials 2+ and equal bilaterally.  Accessory Clinical Findings  CBC  Basename 07/03/12 0600 07/02/12 0500  WBC 16.4* 8.9  NEUTROABS -- --  HGB 14.6 13.5  HCT 43.0 39.5  MCV 88.5 89.6  PLT  143* 128*   Basic Metabolic Panel  Basename 07/03/12 0600 07/02/12 0500  NA 140 140  K 3.2* 3.1*  CL 101 107  CO2 26 26  GLUCOSE 108* 106*  BUN 7 6  CREATININE 0.61 0.57  CALCIUM 9.0 8.1*  MG 1.8 1.9  PHOS 2.4 1.6*   Hemoglobin A1C  Basename 07/01/12 0532  HGBA1C 6.1*   Fasting Lipid Panel  Basename 07/01/12 0532  CHOL 248*  HDL 57  LDLCALC 156*  TRIG 177*  CHOLHDL 4.4  LDLDIRECT --   TELE  Afib, 90's.  Radiology/Studies  Mr Sherrin Daisy Contrast  07/02/2012  *RADIOLOGY REPORT*  Clinical Data:  Left-sided weakness.  MRI BRAIN WITHOUT CONTRAST MRA HEAD WITHOUT CONTRAST  IMPRESSION: No acute infarct.  Please see above.  MRA HEAD  Findings: Anterior circulation without medium or large size vessel significant stenosis or occlusion.  Middle cerebral artery branch vessel irregularity bilaterally.  Left vertebral artery is dominant.  Mild irregularity and narrowing of the right vertebral artery.  Nonvisualization PICAs.  No significant stenosis of the basilar artery.  Irregularity of the AICA greater on the left.  Irregularity of the superior cerebellar artery  slightly greater on the left.  Poor delineation distal posterior cerebral artery branches bilaterally.  No aneurysm noted.  IMPRESSION: Intracranial atherosclerotic type changes most notable involving branch vessels as discussed above.   Original Report Authenticated By: Fuller Canada, M.D.    ASSESSMENT AND PLAN  1.  Subacute right parietal embolic CVA:  In setting of afib of unknown duration.  Xarelto started today.  2.  Afib:  Reasonably rate controlled - 90's.  With ongoing hypertension, will push bb to 50mg  bid.  Xarelto started today but pt and husband not sure as to affordability.  They have Medicare and currently hit doughnut hole in November.  They do have a supplement through [facility] but are unsure how that will affect Xarelto.  Will ask Case Mgmt to see and assist.  If she cannot afford Xarelto, we would plan to  switch to coumadin.  She is followed @ [facility], where she could have INR's followed.  3.  Seizure:  In setting of #1.  Keppra per neuro.  4.  HL:  LDL 156.  Cont statin.  Consider up-titration.  5.  Leukocytosis:  ? Etiology.  UA looked ok.  Afebrile.  Per IM.  6.  Hypokalemia:  supp.  Signed, Nicolasa Ducking NP  Patient seen, examined. Available data reviewed. Agree with findings, assessment, and plan as outlined by Ward Givens, NP. Exam pertinent for an alert, oriented, pleasant woman in NAD. Heart is irregularly irregular without murmur or gallop. There is no edema. Agree with increase in beta-blocker for better BP and heart rate control. Appreciate case manager assistance with eval for Xarelto cost.  Tonny Bollman, M.D. 07/03/2012 12:27 PM

## 2012-07-03 NOTE — Procedures (Signed)
EEG NUMBER:  [id number]  INDICATION FOR STUDY:  A 78 year old lady with acute right parietal stroke and new onset seizure activity.  DESCRIPTION:  This is a routine EEG recording performed during wakefulness and during sleep.  Predominant background activity during wakefulness consisted of 9-10 Hz symmetrical alpha rhythm, which attenuated well with eye opening.  Photic stimulation produced a symmetrical occipital driving response.  Hyperventilation was not performed.  During sleep, symmetrical sleep spindles, and K complexes were recorded along with generalized mixed irregular delta and theta background activity.  No epileptiform discharges were recorded.  There were no areas of abnormal slowing.  INTERPRETATION:  This is a normal EEG recording during wakefulness and during sleep.     Noel Christmas, MD    MV:HQIO D:  07/02/2012 13:12:47  T:  07/03/2012 02:41:31  Job #:  [id number]

## 2012-07-03 NOTE — Progress Notes (Signed)
Occupational Therapy Treatment Patient Details Name: Taisha Pennebaker MRN: [id number] DOB: 10-Sep-1933 Today's Date: 07/03/2012 Time: 7782-4235 OT Time Calculation (min): 25 min  OT Assessment / Plan / Recommendation Comments on Treatment Session Pt doing much better this session. No longer recommending OT f/u    Follow Up Recommendations  No OT follow up    Barriers to Discharge       Equipment Recommendations  None recommended by PT;None recommended by OT    Recommendations for Other Services    Frequency     Plan Discharge plan needs to be updated    Precautions / Restrictions Precautions Precautions: Fall Restrictions Weight Bearing Restrictions: No   Pertinent Vitals/Pain Pt c/o left bicep pain- encouraged continued use/ROM and heat    ADL  Grooming: Performed;Independent Where Assessed - Grooming: Unsupported standing Upper Body Bathing: Simulated;Supervision/safety Where Assessed - Upper Body Bathing: Unsupported standing Lower Body Bathing: Simulated;Supervision/safety Where Assessed - Lower Body Bathing: Unsupported standing Upper Body Dressing: Performed;Modified independent (incr time) Where Assessed - Upper Body Dressing: Unsupported standing Lower Body Dressing: Performed;Supervision/safety Where Assessed - Lower Body Dressing: Unsupported sit to stand Toilet Transfer: Performed;Independent Toilet Transfer Method: Sit to Barista: Regular height toilet Toileting - Clothing Manipulation and Hygiene: Simulated;Independent Where Assessed - Toileting Clothing Manipulation and Hygiene: Sit to stand from 3-in-1 or toilet Tub/Shower Transfer: Performed;Supervision/safety Tub/Shower Transfer Method: Ambulating Equipment Used: Gait belt Transfers/Ambulation Related to ADLs: supervision for ambulation throughout room and hallways  ADL Comments: pt with much improvement this session. main c/o bicep muscle pain which appears to be muscle strain. Pt  encouarged to continue using LUE as much as possible and to place heat packs and perform ROM at least 4 times each day. Pt asked to informed OPPT if this pain continues    OT Diagnosis:    OT Problem List:   OT Treatment Interventions:     OT Goals ADL Goals ADL Goal: Grooming - Progress: Met ADL Goal: Upper Body Bathing - Progress: Met ADL Goal: Lower Body Bathing - Progress: Met ADL Goal: Lower Body Dressing - Progress: Progressing toward goals ADL Goal: Toilet Transfer - Progress: Met ADL Goal: Toileting - Clothing Manipulation - Progress: Met ADL Goal: Toileting - Hygiene - Progress: Met Miscellaneous OT Goals OT Goal: Miscellaneous Goal #1 - Progress: Progressing toward goals  Visit Information  Last OT Received On: 07/03/12 Assistance Needed: +1    Subjective Data  Subjective: I'm feeling so much better   Prior Functioning       Cognition  Overall Cognitive Status: Appears within functional limits for tasks assessed/performed Arousal/Alertness: Awake/alert Orientation Level: Appears intact for tasks assessed Behavior During Session: [facility] for tasks performed    Mobility  Shoulder Instructions Bed Mobility Bed Mobility: Sit to Supine Supine to Sit: 5: Supervision Sitting - Scoot to Edge of Bed: 5: Supervision Sit to Supine: 5: Supervision Details for Bed Mobility Assistance: Cues for optimal positioning to ensure decreased effort on patients behalf Transfers Sit to Stand: 5: Supervision;With upper extremity assist;From bed;From chair/3-in-1;From toilet Stand to Sit: 5: Supervision;With upper extremity assist;To chair/3-in-1;To bed Details for Transfer Assistance: Supervision for safety       Exercises      Balance     End of Session OT - End of Session Equipment Utilized During Treatment: Gait belt Activity Tolerance: Patient tolerated treatment well Patient left: in chair;with call bell/phone within reach;with family/visitor present Nurse Communication:  Mobility status  GO     Danni Shima 07/03/2012,  1:42 PM

## 2012-07-03 NOTE — Progress Notes (Addendum)
Stroke Team Progress Note  HISTORY Melissa Knight is an 78 y.o. female with a history of hypertension, congestive heart failure and thyroid disease, presenting with new onset left-sided weakness and speech difficulty as well as gaze preference to the left side. Patient was brought to the hospital in code stroke status 06/30/2012. On the way to CT scan she had a witnessed generalized seizure and was returned to the emergency room and subsequently intubated for airway protection. She was given Ativan 2 mg IV. CT scan of her head showed low density area involving the right parieto-occipital region, possibly a manifestation of subacute infarction. There was no sign of intracranial hemorrhage. NIH score could not be adequately assessed because the patient was intubated and had been given paralytic agents.  Patient was not a TPA candidate secondary to seizure at onset. She was admitted to the neuro ICU for further evaluation and treatment.  SUBJECTIVE  Feels that condition is stable.  OBJECTIVE Most recent Vital Signs: Filed Vitals:   07/02/12 1600 07/02/12 2203 07/03/12 0115 07/03/12 0638  BP: 167/91 190/105 168/94 154/103  Pulse: 86 88 88 85  Temp: 97.3 F (36.3 C) 98.9 F (37.2 C) 98.7 F (37.1 C) 98.6 F (37 C)  TempSrc: Oral Oral Oral Oral  Resp: 22 18 18 18   Height:      Weight:      SpO2: 97% 97% 98% 98%   CBG (last 3)   Basename 07/03/12 0640 07/02/12 2202 07/02/12 1616  GLUCAP 105* 121* 101*    IV Fluid Intake:      . sodium chloride 50 mL/hr at 07/01/12 1706  . DISCONTD: niCARDipine Stopped (07/01/12 0200)    MEDICATIONS     . atorvastatin  10 mg Oral q1800  . influenza  inactive virus vaccine  0.5 mL Intramuscular Tomorrow-1000  . levetiracetam  500 mg Intravenous Q12H  . levothyroxine  88 mcg Oral Daily  . metoprolol  25 mg Oral BID  . pantoprazole (PROTONIX) IV  40 mg Intravenous Daily  . potassium chloride  40 mEq Oral Once  . rivaroxaban  20 mg Oral Daily  .  DISCONTD: aspirin  325 mg Oral Daily   PRN:  metoprolol  Diet:  Dysphagia 2 thin liquids Activity:  Bedrest and out of bed and Up in chair DVT Prophylaxis:  SCDs   CLINICALLY SIGNIFICANT STUDIES Basic Metabolic Panel:   Lab 07/03/12 0600 07/02/12 0500  NA 140 140  K 3.2* 3.1*  CL 101 107  CO2 26 26  GLUCOSE 108* 106*  BUN 7 6  CREATININE 0.61 0.57  CALCIUM 9.0 8.1*  MG 1.8 1.9  PHOS 2.4 1.6*   Liver Function Tests:   Lab 06/30/12 0920  AST 26  ALT 18  ALKPHOS 71  BILITOT 0.6  PROT 7.5  ALBUMIN 3.8   CBC:   Lab 07/03/12 0600 07/02/12 0500 06/30/12 0920  WBC 16.4* 8.9 --  NEUTROABS -- -- 5.6  HGB 14.6 13.5 --  HCT 43.0 39.5 --  MCV 88.5 89.6 --  PLT 143* 128* --   Coagulation:   Lab 06/30/12 0920  LABPROT 13.8  INR 1.07   Cardiac Enzymes:   Lab 06/30/12 0920  CKTOTAL --  CKMB --  CKMBINDEX --  TROPONINI <0.30   Urinalysis:   Lab 06/30/12 1137  COLORURINE YELLOW  LABSPEC 1.011  PHURINE 6.5  GLUCOSEU 100*  HGBUR SMALL*  BILIRUBINUR NEGATIVE  KETONESUR NEGATIVE  PROTEINUR NEGATIVE  UROBILINOGEN 0.2  NITRITE NEGATIVE  LEUKOCYTESUR  NEGATIVE   Lipid Panel    Component Value Date/Time   CHOL 248* 07/01/2012 0532   TRIG 177* 07/01/2012 0532   HDL 57 07/01/2012 0532   CHOLHDL 4.4 07/01/2012 0532   VLDL 35 07/01/2012 0532   LDLCALC 156* 07/01/2012 0532   HgbA1C  Lab Results  Component Value Date   HGBA1C 6.1* 07/01/2012    Urine Drug Screen:     Component Value Date/Time   LABOPIA NONE DETECTED 06/30/2012 1138   COCAINSCRNUR NONE DETECTED 06/30/2012 1138   LABBENZ POSITIVE* 06/30/2012 1138   AMPHETMU NONE DETECTED 06/30/2012 1138   THCU NONE DETECTED 06/30/2012 1138   LABBARB NONE DETECTED 06/30/2012 1138    Alcohol Level: No results found for this basename: ETH:2 in the last 168 hours  CT of the brain   07/01/2012   Right parietal area of hypodensity again demonstrated.  Acute infarct suspected.  No significant interval change.   No acute hemorrhage.    06/30/2012    1.  Right parietal lobe hypodensity on image 16 suspicious for subacute stroke, potentially in the posterior watershed distribution.  No hemorrhage identified. 2.  Patchy white matter hypodensities in the periventricular white matter, most likely chronic ischemic microvascular white matter disease. 3.  Old remote lacunar infarct of the head of the right caudate nucleus. 4.  Chronic maxillary sinusitis with mild chronic sphenoid and ethmoid sinusitis.   MRI/MRA of the brain  Remote infarct with encephalomalacia posterior right temporal -  parietal junction.  Remote small right caudate head infarct. Tiny infarcts scattered throughout the centrum semiovale/corona  radiata greater on the right.Intracranial atherosclerotic type changes.  2D Echocardiogram  EF 60%, systolic fxn nl, normal atrium.  Carotid Doppler  There is no obvious evidence of hemodynamically significant internal carotid artery stenosis  CXR   07/02/2012   Interval extubation.  Right IJ catheter tip projects over the proximal to mid SVC.  Mild bibasilar opacities; atelectasis versus infiltrate.    07/01/2012  1.  Stable positioning of support apparatus.  No pneumothorax. 2.  Worsening right lower lung atelectasis. Unchanged left lower lung atelectasis    06/30/2012  Stable support apparatus.  Right IJ central line in place.  No diagnostic pneumothorax.   06/30/2012  Endotracheal tube in place with tip 2.4 cm above the carina.  No diagnostic pneumothorax.  NG tube in place. No acute infiltrate or pulmonary edema.   EKG  Preliminary atrial fibrillation   Therapy Recommendations PT - OP; OT - home health OP  Physical Exam  Pleasant elderly Caucasian lady currently not in distress.Awake alert. Afebrile. Head is nontraumatic. Neck is supple without bruit. Hearing is normal. Cardiac exam no murmur or gallop. Lungs are clear to auscultation. Distal pulses are well felt.  Neurological Exam : Awake   Alert oriented x 3. Normal speech and language.fundi were not visualized. Vision acuity appears normal. There appears to be dense left homonymous hemianopsia.eye movements full without nystagmus. Face asymmetric with mild left lower face weakness. Tongue midline. Normal strength, tone, reflexes and coordination. Normal sensation. Gait deferred.   ASSESSMENT Melissa Knight is a 78 y.o. female presenting with left sided weakness, speech difficulty with left gaze preference in setting of acute seizure. Intubated on admission, now extubated. Imaging confirms a subacute right parietal infarct. Infarct felt to be embolic secondary to possible atrial fibrillation (present on initial EKG). Also felt to be todds paralysis seizure due to subacute stroke. Work up underway. On no antiplatelets prior to admission. Now on  aspirin 325 mg orally every day for secondary stroke prevention. Patient with no resultant neuro deficits per exam.   HgbA1c 6.1 Hyperlipidemia Hypertension Atrial fibrillation Subacute right parietal infarct with subsequent seizure representing todds paralysis  Hospital day # 3  TREATMENT/PLAN  Change to Xarelto for secondary stroke prevention (afib).  Risk factor modification  LDL 156, goal < 100, lipitor started  Keppra for seizure Stroke service will sign off. Followup with Dr. Pearlean Brownie in 2 mos.  Guy Franco, PAC,  MBA, MHA [facility] Pager: [phone] 07/03/2012 1:34 PM  Scribe for Dr. Delia Heady, Stroke Center Medical Director. He has personally reviewed chart, pertinent data, examined the patient and developed the plan of care. Pager:  [phone]

## 2012-07-03 NOTE — Progress Notes (Signed)
Physical Therapy Treatment Patient Details Name: Melissa Knight MRN: [id number] DOB: [birth year] Today's Date: 07/03/2012 Time: 7846-9629 PT Time Calculation (min): 27 min  PT Assessment / Plan / Recommendation Comments on Treatment Session  Patient making really great progress this session. No signs of confusion or delayed processing. Patient is complaining of stiff L upper arm. May attempt to complete DGI next session    Follow Up Recommendations  Outpatient PT;Supervision for mobility/OOB     Does the patient have the potential to tolerate intense rehabilitation     Barriers to Discharge        Equipment Recommendations  None recommended by PT    Recommendations for Other Services    Frequency Min 4X/week   Plan Discharge plan remains appropriate;Frequency remains appropriate    Precautions / Restrictions Precautions Precautions: Fall   Pertinent Vitals/Pain     Mobility  Bed Mobility Bed Mobility: Sit to Supine Supine to Sit: 5: Supervision Sitting - Scoot to Edge of Bed: 5: Supervision Sit to Supine: 5: Supervision Details for Bed Mobility Assistance: Cues for optimal positioning to ensure decreased effort on patients behalf Transfers Sit to Stand: 5: Supervision;With upper extremity assist;From bed;From chair/3-in-1 Stand to Sit: 5: Supervision;With upper extremity assist;To chair/3-in-1;To bed Details for Transfer Assistance: Supervision for safety Ambulation/Gait Ambulation/Gait Assistance: 4: Min guard Ambulation Distance (Feet): 350 Feet Assistive device: None Ambulation/Gait Assistance Details: Patient able to ambulate without difficulty until adding in some dynamic balance activities such as head turns and gait changes. Patient with no significant LOB, some swaying Gait Pattern: Step-through pattern Gait velocity: decreased Stairs: Yes Stairs Assistance: 4: Min guard Stair Management Technique: Two rails;Step to pattern;Forwards Number of Stairs: 5    Modified Rankin (Stroke Patients Only) Pre-Morbid Rankin Score: No symptoms Modified Rankin: Moderately severe disability    Exercises     PT Diagnosis:    PT Problem List:   PT Treatment Interventions:     PT Goals Acute Rehab PT Goals PT Goal: Supine/Side to Sit - Progress: Progressing toward goal PT Goal: Sit to Supine/Side - Progress: Progressing toward goal PT Goal: Sit to Stand - Progress: Progressing toward goal PT Goal: Stand to Sit - Progress: Progressing toward goal PT Transfer Goal: Bed to Chair/Chair to Bed - Progress: Met PT Goal: Ambulate - Progress: Met PT Goal: Up/Down Stairs - Progress: Progressing toward goal  Visit Information  Last PT Received On: 07/03/12 Assistance Needed: +1    Subjective Data      Cognition  Overall Cognitive Status: Appears within functional limits for tasks assessed/performed Arousal/Alertness: Awake/alert Orientation Level: Appears intact for tasks assessed Behavior During Session: [facility] for tasks performed    Balance     End of Session PT - End of Session Equipment Utilized During Treatment: Gait belt Activity Tolerance: Patient tolerated treatment well Patient left: in chair;with call bell/phone within reach;with family/visitor present Nurse Communication: Mobility status   GP     Fredrich Birks 07/03/2012, 1:09 PM 07/03/2012 Fredrich Birks PTA [phone] pager [phone] office

## 2012-07-03 NOTE — Progress Notes (Signed)
Speech Language Pathology Dysphagia Treatment Patient Details Name: Melissa Knight MRN: [id number] DOB: February 17, 1933 Today's Date: 07/03/2012 Time: 6213-0865 SLP Time Calculation (min): 10 min  Assessment / Plan / Recommendation Clinical Impression  Pt. seen for dysphagia therapy.  Pt. reports continues lingual soreness from irritated area (bit tongue during seizure).  Pt. preferred to remain on Dys 2 diet at present.  SLP educated and answered questions regarding appropriate food texture to consume once home.  No s/s aspiration with cup and straw sips water.  Continue with Dys 2 diet and thin liquids and advance as able after discharge.       Diet Recommendation  Continue with Current Diet: Dysphagia 2 (fine chop);Thin liquid    SLP Plan All goals met       Swallowing Goals  SLP Swallowing Goals Patient will consume recommended diet without observed clinical signs of aspiration with: Modified independent assistance Swallow Study Goal #1 - Progress: Met Patient will utilize recommended strategies during swallow to increase swallowing safety with: Modified independent assistance Swallow Study Goal #2 - Progress: Met  General Temperature Spikes Noted: No Respiratory Status: Room air Behavior/Cognition: Alert;Cooperative;Pleasant mood Oral Cavity - Dentition: Adequate natural dentition Patient Positioning: Upright in chair  Oral Cavity - Oral Hygiene Does patient have any of the following "at risk" factors?:  (edematous area on tongue) Brush patient's teeth BID with toothbrush (using toothpaste with fluoride): Yes Patient is AT RISK - Oral Care Protocol followed (see row info): Yes   Dysphagia Treatment Treatment focused on: Skilled observation of diet tolerance;Patient/family/caregiver education Treatment Methods/Modalities: Skilled observation Patient observed directly with PO's: Yes Type of PO's observed: Thin liquids Feeding: Able to feed self Liquids provided via:  Cup;Straw Type of cueing: Verbal Amount of cueing: Minimal        Royce Macadamia M.Ed [employer] [phone]  07/03/2012

## 2012-07-03 NOTE — Care Management Note (Signed)
    Page 1 of 1   07/04/2012     12:41:17 PM   CARE MANAGEMENT NOTE 07/04/2012  Patient:  Melissa Knight, Melissa Knight   Account Number:  [id number]  Date Initiated:  07/03/2012  Documentation initiated by:  [facility]  Subjective/Objective Assessment:   Admitted with left sided weakness, seizures.     Action/Plan:   PT/OT evals   Anticipated DC Date:  07/04/2012   Anticipated DC Plan:  HOME/SELF CARE      DC Planning Services  CM consult      Choice offered to / List presented to:             Status of service:  Completed, signed off Medicare Important Message given?   (If response is "NO", the following Medicare IM given date fields will be blank) Date Medicare IM given:   Date Additional Medicare IM given:    Discharge Disposition:  HOME/SELF CARE  Per UR Regulation:  Reviewed for med. necessity/level of care/duration of stay  If discussed at Long Length of Stay Meetings, dates discussed:    Comments:  07/04/12 Patient changed to coumadin. Made appt for PT/INR at [facility] for 07/06/12 at 10:30 per Dr. Edison Simon request. Gave patient the appt time. Jacquelynn Cree RN, BSN, CCM   07/03/12 Patient started on xarelto. Spoke with patient about xarelto, she has Medicare part D and [facility] [place]. [facility] [place] and [facility]to would cost her $319.99 for 30 day supply of 20mg  daily.Only assistance available for Medicare D patients is 10 day free card. Patient would rather not be on that expensive a medication. I checked with her PCP office [facility] Daphine Deutscher FNPat [facility] [phone], they would be able to check her PT, INR if started on coumadin. Will continue to follow for d/c needs. Jacquelynn Cree RN, BSN, CCM

## 2012-07-04 DIAGNOSIS — G9340 Encephalopathy, unspecified: Secondary | ICD-10-CM | POA: Diagnosis not present

## 2012-07-04 DIAGNOSIS — G40319 Generalized idiopathic epilepsy and epileptic syndromes, intractable, without status epilepticus: Secondary | ICD-10-CM | POA: Diagnosis not present

## 2012-07-04 DIAGNOSIS — I4891 Unspecified atrial fibrillation: Secondary | ICD-10-CM | POA: Diagnosis not present

## 2012-07-04 DIAGNOSIS — J9600 Acute respiratory failure, unspecified whether with hypoxia or hypercapnia: Secondary | ICD-10-CM

## 2012-07-04 DIAGNOSIS — I6350 Cerebral infarction due to unspecified occlusion or stenosis of unspecified cerebral artery: Secondary | ICD-10-CM | POA: Diagnosis not present

## 2012-07-04 LAB — BASIC METABOLIC PANEL
CO2: 24 mEq/L (ref 19–32)
Chloride: 106 mEq/L (ref 96–112)
Creatinine, Ser: 0.73 mg/dL (ref 0.50–1.10)
Glucose, Bld: 103 mg/dL — ABNORMAL HIGH (ref 70–99)
Sodium: 141 mEq/L (ref 135–145)

## 2012-07-04 LAB — GLUCOSE, CAPILLARY

## 2012-07-04 LAB — MAGNESIUM: Magnesium: 1.8 mg/dL (ref 1.5–2.5)

## 2012-07-04 MED ORDER — POTASSIUM CHLORIDE CRYS ER 20 MEQ PO TBCR: 20.0000 meq | EXTENDED_RELEASE_TABLET | Freq: Every day | ORAL | Status: DC

## 2012-07-04 MED ORDER — METOPROLOL TARTRATE 50 MG PO TABS: 50.0000 mg | ORAL_TABLET | Freq: Two times a day (BID) | ORAL | Status: DC

## 2012-07-04 MED ORDER — WARFARIN SODIUM 7.5 MG PO TABS
7.5000 mg | ORAL_TABLET | Freq: Once | ORAL | Status: DC
  Filled 2012-07-04: qty 1

## 2012-07-04 MED ORDER — WARFARIN SODIUM 7.5 MG PO TABS: 7.5000 mg | ORAL_TABLET | Freq: Once | ORAL | Status: DC

## 2012-07-04 MED ORDER — ATORVASTATIN CALCIUM 10 MG PO TABS: 10.0000 mg | ORAL_TABLET | Freq: Every day | ORAL | Status: DC

## 2012-07-04 MED ORDER — WARFARIN - PHARMACIST DOSING INPATIENT: Freq: Every day | Status: DC

## 2012-07-04 MED ORDER — CEFUROXIME AXETIL 500 MG PO TABS: 500.0000 mg | ORAL_TABLET | Freq: Two times a day (BID) | ORAL | Status: DC

## 2012-07-04 MED ORDER — WARFARIN VIDEO
Freq: Once | Status: AC
  Administered 2012-07-04: 13:00:00

## 2012-07-04 MED ORDER — COUMADIN BOOK
Freq: Once | Status: AC
  Administered 2012-07-04: 13:00:00
  Filled 2012-07-04: qty 1

## 2012-07-04 MED ORDER — LEVETIRACETAM 500 MG PO TABS: 500.0000 mg | ORAL_TABLET | Freq: Two times a day (BID) | ORAL | Status: DC

## 2012-07-04 NOTE — Progress Notes (Signed)
ANTICOAGULATION CONSULT NOTE - Initial Consult  Pharmacy Consult for Warfarin Indication: atrial fibrillation  Allergies  Allergen Reactions  . Sulfur     Patient Measurements: Height: 5\' 5"  (165.1 cm) Weight: 210 lb 8.6 oz (95.5 kg) IBW/kg (Calculated) : 57   Vital Signs: Temp: 97.7 F (36.5 C) (10/23 0600) Temp src: Oral (10/23 0600) BP: 152/104 mmHg (10/23 1013) Pulse Rate: 97  (10/23 1013)  Labs:  Basename 07/04/12 0605 07/03/12 0600 07/02/12 0500  HGB -- 14.6 13.5  HCT -- 43.0 39.5  PLT -- 143* 128*  APTT -- -- --  LABPROT -- -- --  INR -- -- --  HEPARINUNFRC -- -- --  CREATININE 0.73 0.61 0.57  CKTOTAL -- -- --  CKMB -- -- --  TROPONINI -- -- --    Estimated Creatinine Clearance: 67.3 ml/min (by C-G formula based on Cr of 0.73).   Medical History: Past Medical History  Diagnosis Date  . Hypertension   . Hypothyroidism   . Atrial fibrillation 06/2012  . Acute ischemic stroke 06/2012    acute right parietal ischemic stroke  . Seizure 06/2012    in the setting of acute ischemic stroke    Medications:  Scheduled:    . atorvastatin  10 mg Oral q1800  . cefUROXime  500 mg Oral BID WC  . levETIRAcetam  500 mg Oral BID  . levothyroxine  88 mcg Oral Daily  . metoprolol  50 mg Oral BID  . pantoprazole  40 mg Oral Daily  . potassium chloride  40 mEq Oral Once  . potassium chloride  40 mEq Oral Once  . potassium chloride  40 mEq Oral Daily  . DISCONTD: levetiracetam  500 mg Intravenous Q12H  . DISCONTD: pantoprazole (PROTONIX) IV  40 mg Intravenous Daily  . DISCONTD: rivaroxaban  20 mg Oral Daily  . DISCONTD: rivaroxaban  20 mg Oral Q supper    Assessment: Atrial Fibrillation, new CVA:  She received one dose of Xarelto on 10/22 and is now being changed to Warfarin.  She can start Warfarin tonight (~24hrs after her last Xarelto dose).  Goal of Therapy:  INR 2-3   Plan:  Warfarin 7.5mg  x 1 today Daily PT/INR Order Warfarin education  materials  Melissa Knight, [address].D., BCPS Clinical Pharmacist  Phone [phone] Pager [phone] 07/04/2012, 10:46 AM

## 2012-07-04 NOTE — Progress Notes (Signed)
    Subjective:  Left arm moving better. No CP, dyspnea, palps.  Objective:  Vital Signs in the last 24 hours: Temp:  [97.7 F (36.5 C)-99 F (37.2 C)] 97.7 F (36.5 C) (10/23 0600) Pulse Rate:  [70-96] 70  (10/23 0600) Resp:  [18-20] 18  (10/23 0600) BP: (146-174)/(86-102) 150/88 mmHg (10/23 0600) SpO2:  [94 %-98 %] 97 % (10/23 0600)  Intake/Output from previous day: 10/22 0701 - 10/23 0700 In: 240 [P.O.:240] Out: -   Physical Exam: Pt is alert and oriented, NAD HEENT: normal Neck: JVP - normal, carotids 2+= without bruits Lungs: CTA bilaterally CV: irregularly irregular without murmur or gallop Abd: soft, NT, Positive BS, no hepatomegaly Ext: no C/C/E, distal pulses intact and equal Skin: warm/dry no rash   Lab Results:  Basename 07/03/12 0600 07/02/12 0500  WBC 16.4* 8.9  HGB 14.6 13.5  PLT 143* 128*    Basename 07/04/12 0605 07/03/12 0600  NA 141 140  K 4.1 3.2*  CL 106 101  CO2 24 26  GLUCOSE 103* 108*  BUN 13 7  CREATININE 0.73 0.61   No results found for this basename: TROPONINI:2,CK,MB:2 in the last 72 hours  Tele: atrial fibrillation  Assessment/Plan:  1. Subacute right parietal embolic CVA: In setting of afib of unknown duration. Xarelto started yesterday, but cost will be prohibitive. Recommend start warfarin and arrange PT/INR's to be checked by [facility]. 2. Afib: Reasonably rate controlled - 90's. With ongoing hypertension, will push bb to 50mg  bid.  3. Seizure: In setting of #1. Keppra per neuro.  4. HL: LDL 156. Cont statin.  5. Follow-up: will arrange 2 week post-hospital visit with PA in the [facility] office and I will follow long-term.  Tonny Bollman, M.D. 07/04/2012, 8:31 AM

## 2012-07-04 NOTE — Progress Notes (Signed)
Physical Therapy Treatment Patient Details Name: Melissa Knight MRN: [id number] DOB: 1932/09/16 Today's Date: 07/04/2012 Time: 7829-5621 PT Time Calculation (min): 25 min  PT Assessment / Plan / Recommendation Comments on Treatment Session  Patient progressing well this session, however states legs feel more fatiqued then yesterday. Patient scored 15 on DGI indicateing fall risk    Follow Up Recommendations  Outpatient PT;Supervision for mobility/OOB     Does the patient have the potential to tolerate intense rehabilitation     Barriers to Discharge        Equipment Recommendations  None recommended by PT    Recommendations for Other Services    Frequency Min 4X/week   Plan Discharge plan remains appropriate    Precautions / Restrictions Precautions Precautions: Fall Restrictions Weight Bearing Restrictions: No   Pertinent Vitals/Pain     Mobility  Bed Mobility Bed Mobility: Not assessed Transfers Sit to Stand: 6: Modified independent (Device/Increase time) Stand to Sit: 6: Modified independent (Device/Increase time) Ambulation/Gait Ambulation/Gait Assistance: 4: Min guard Ambulation Distance (Feet): 400 Feet Assistive device: None Ambulation/Gait Assistance Details: Patient swaying with ambulation a little more today. patient states her legs feel more tired today. No LOB with straight ambulation. Performed DGI Gait Pattern: Step-through pattern;Decreased stride length Gait velocity: decreased Stairs Assistance: 4: Min guard Stair Management Technique: Two rails;Step to pattern;Forwards Number of Stairs: 10     Exercises     PT Diagnosis:    PT Problem List:   PT Treatment Interventions:     PT Goals Acute Rehab PT Goals PT Goal: Sit to Supine/Side - Progress: Met PT Goal: Sit to Stand - Progress: Met PT Goal: Stand to Sit - Progress: Met PT Transfer Goal: Bed to Chair/Chair to Bed - Progress: Met PT Goal: Ambulate - Progress: Progressing toward goal PT  Goal: Up/Down Stairs - Progress: Progressing toward goal  Visit Information  Last PT Received On: 07/04/12 Assistance Needed: +1    Subjective Data      Cognition  Overall Cognitive Status: Appears within functional limits for tasks assessed/performed Arousal/Alertness: Awake/alert Orientation Level: Appears intact for tasks assessed Behavior During Session: [facility] for tasks performed    Balance  Standardized Balance Assessment Standardized Balance Assessment: Dynamic Gait Index Dynamic Gait Index Level Surface: Mild Impairment Change in Gait Speed: Mild Impairment Gait with Horizontal Head Turns: Mild Impairment Gait with Vertical Head Turns: Mild Impairment Gait and Pivot Turn: Mild Impairment Step Over Obstacle: Moderate Impairment Step Around Obstacles: Mild Impairment Steps: Mild Impairment Total Score: 15   End of Session PT - End of Session Equipment Utilized During Treatment: Gait belt Activity Tolerance: Patient tolerated treatment well;Patient limited by fatigue Patient left: in chair;with call bell/phone within reach;with family/visitor present Nurse Communication: Mobility status   GP     Fredrich Birks 07/04/2012, 11:34 AM 07/04/2012 Fredrich Birks PTA [phone] pager [phone] office

## 2012-07-04 NOTE — Progress Notes (Signed)
Pt discharge orders received. Pt education completed and pt is hemodynamically stable. Pt is to be d/c home with husband. [name], [place]

## 2012-07-04 NOTE — Discharge Summary (Signed)
Physician Discharge Summary  Patient ID: Melissa Knight MRN: [id number] DOB/AGE: May 10, 1933 78 y.o.  Admit date: 06/30/2012 Discharge date: 07/04/2012  Primary Care Physician:  [facility] Floor, FNP [place] [facility]   Disposition and Follow-up:  PCP on 10/25 INR check 10/25, Goal INR 2-3 [place] [facility] in 2 weeks  Discharge Diagnoses:    Principal Problem: Subacute CVA  Seizure disorder, generalized convulsive, intractable Todds paralysis  Acute respiratory failure  S/p VDRF  HTN (hypertension), malignant  Atrial fibrillation  Ecoli UTI  Hypothyroidism    Medication List     As of 07/04/2012 12:58 PM    TAKE these medications         atorvastatin 10 MG tablet   Commonly known as: LIPITOR   Take 1 tablet (10 mg total) by mouth daily at 6 PM.      cefUROXime 500 MG tablet   Commonly known as: CEFTIN   Take 1 tablet (500 mg total) by mouth 2 (two) times daily with a meal. For 2 days      furosemide 20 MG tablet   Commonly known as: LASIX   Take 20 mg by mouth daily.      levETIRAcetam 500 MG tablet   Commonly known as: KEPPRA   Take 1 tablet (500 mg total) by mouth 2 (two) times daily.      levothyroxine 88 MCG tablet   Commonly known as: SYNTHROID, LEVOTHROID   Take 88 mcg by mouth daily.      metoprolol 50 MG tablet   Commonly known as: LOPRESSOR   Take 1 tablet (50 mg total) by mouth 2 (two) times daily. Take 2 tablets in the morning and 1 tablet in the bedtime      niacin 500 MG CR tablet   Commonly known as: NIASPAN   Take 500 mg by mouth at bedtime.      potassium chloride SA 20 MEQ tablet   Commonly known as: K-DUR,KLOR-CON   Take 1 tablet (20 mEq total) by mouth daily.      warfarin 7.5 MG tablet   Commonly known as: COUMADIN   Take 1 tablet (7.5 mg total) by mouth one time only at 6 PM. Take 7.5mg  10/23 and 10/24 then based on INR 10/25, Goal INR is 2-3         Consults: Dr.Sethi, Neurology Dr.Cooper, [facility]  cardiology   Significant Diagnostic Studies:  Mr Shirlee Latch Wo Contrast  07/02/2012  *RADIOLOGY REPORT*  Clinical Data:  Left-sided weakness.  MRI BRAIN WITHOUT CONTRAST MRA HEAD WITHOUT CONTRAST  Technique: Multiplanar, multiecho pulse sequences of the brain and surrounding structures were obtained according to standard protocol without intravenous contrast.  Angiographic images of the head were obtained using MRA technique without contrast.  Comparison: 07/01/2012 head CT.  MRI HEAD  Findings:  No acute infarct.  Remote infarct with encephalomalacia posterior right temporal - parietal junction.  Remote small right caudate head infarct.  Tiny infarcts scattered throughout the centrum semiovale/corona radiata greater on the right.  Small vessel disease type changes.  Global atrophy without hydrocephalus.  No intracranial hemorrhage.  No intracranial mass lesion detected on this unenhanced exam.  Exophthalmos.  IMPRESSION: No acute infarct.  Please see above.  MRA HEAD  Findings: Anterior circulation without medium or large size vessel significant stenosis or occlusion.  Middle cerebral artery branch vessel irregularity bilaterally.  Left vertebral artery is dominant.  Mild irregularity and narrowing of the right vertebral artery.  Nonvisualization PICAs.  No significant  stenosis of the basilar artery.  Irregularity of the AICA greater on the left.  Irregularity of the superior cerebellar artery  slightly greater on the left.  Poor delineation distal posterior cerebral artery branches bilaterally.  No aneurysm noted.  IMPRESSION: Intracranial atherosclerotic type changes most notable involving branch vessels as discussed above.   Original Report Authenticated By: Fuller Canada, M.D.    Mr Brain Wo Contrast  07/02/2012  *RADIOLOGY REPORT*  Clinical Data:  Left-sided weakness.  MRI BRAIN WITHOUT CONTRAST MRA HEAD WITHOUT CONTRAST  Technique: Multiplanar, multiecho pulse sequences of the brain and surrounding  structures were obtained according to standard protocol without intravenous contrast.  Angiographic images of the head were obtained using MRA technique without contrast.  Comparison: 07/01/2012 head CT.  MRI HEAD  Findings:  No acute infarct.  Remote infarct with encephalomalacia posterior right temporal - parietal junction.  Remote small right caudate head infarct.  Tiny infarcts scattered throughout the centrum semiovale/corona radiata greater on the right.  Small vessel disease type changes.  Global atrophy without hydrocephalus.  No intracranial hemorrhage.  No intracranial mass lesion detected on this unenhanced exam.  Exophthalmos.  IMPRESSION: No acute infarct.  Please see above.  MRA HEAD  Findings: Anterior circulation without medium or large size vessel significant stenosis or occlusion.  Middle cerebral artery branch vessel irregularity bilaterally.  Left vertebral artery is dominant.  Mild irregularity and narrowing of the right vertebral artery.  Nonvisualization PICAs.  No significant stenosis of the basilar artery.  Irregularity of the AICA greater on the left.  Irregularity of the superior cerebellar artery  slightly greater on the left.  Poor delineation distal posterior cerebral artery branches bilaterally.  No aneurysm noted.  IMPRESSION: Intracranial atherosclerotic type changes most notable involving branch vessels as discussed above.   Original Report Authenticated By: Fuller Canada, M.D.     Brief H and P: 78 year old white female admitted with acute onset right parietal stroke with status epilepticus and intubated. Critical Care called for consultation. Past history is not fully available.  The patient was last seen normal last night and went to bed and when she awoke this morning she had right gaze preference snoring respirations and left-sided weakness. Her husband called EMS who responded and 15 minutes and they brought the patient to [facility] emergency room whereupon the patient  was intubated. The patient was in a seizure type state upon arrival. CT scan shows a right parietal infarct. Patient is hypertensive at this time. There is a past history of hypothyroidism and congestive failure and hypertension. We do not have a full past history on this patient.    Hospital Course:  1. Seizure with associated left-sided Todd's paralysis, precipitated by subacute stroke: continue Keppra, per neurology. Improving left extremity strength. Neurology following   2. Subacute right parietal infarct: Likely embolic secondary to new-onset atrial fibrillation. Patient started on COumadin for  secondary stroke prevention in the setting of afib  Lipitor started.  Fu with neurology in 1 month  3. Atrial fibrillation with controlled ventricular rate. Started on Coumadin for anticoagulation without bridging per cardiology and Neuro recommendations in the setting of subacute CVA. Continue beta blockers. Cardiology following INR check 10/25 at PCP office, goal INR 2-3, FU with [place] cardiology in 2 weeks  4. Acute resp failure: Stable Status post VDRF: Secondary to seizures. Respiratory status stable.   5. Ecoli UTI: By mouth Ceftin, continue for 2 more days   Time spent on Discharge:  SignedZannie Cove [facility]  07/04/2012, 12:58 PM

## 2012-07-06 DIAGNOSIS — I10 Essential (primary) hypertension: Secondary | ICD-10-CM | POA: Diagnosis not present

## 2012-07-06 DIAGNOSIS — I6789 Other cerebrovascular disease: Secondary | ICD-10-CM | POA: Diagnosis not present

## 2012-07-13 DIAGNOSIS — I6789 Other cerebrovascular disease: Secondary | ICD-10-CM | POA: Diagnosis not present

## 2012-07-16 ENCOUNTER — Encounter: Payer: Self-pay | Admitting: Physician Assistant

## 2012-07-17 ENCOUNTER — Ambulatory Visit: Payer: Medicare Other | Attending: Family Medicine | Admitting: Physical Therapy

## 2012-07-17 DIAGNOSIS — M25619 Stiffness of unspecified shoulder, not elsewhere classified: Secondary | ICD-10-CM | POA: Diagnosis not present

## 2012-07-17 DIAGNOSIS — IMO0001 Reserved for inherently not codable concepts without codable children: Secondary | ICD-10-CM | POA: Insufficient documentation

## 2012-07-17 DIAGNOSIS — M25519 Pain in unspecified shoulder: Secondary | ICD-10-CM | POA: Diagnosis not present

## 2012-07-17 DIAGNOSIS — R5381 Other malaise: Secondary | ICD-10-CM | POA: Diagnosis not present

## 2012-07-17 DIAGNOSIS — I4891 Unspecified atrial fibrillation: Secondary | ICD-10-CM | POA: Diagnosis not present

## 2012-07-18 ENCOUNTER — Ambulatory Visit (INDEPENDENT_AMBULATORY_CARE_PROVIDER_SITE_OTHER): Payer: Medicare Other | Admitting: Physician Assistant

## 2012-07-18 ENCOUNTER — Encounter: Payer: Self-pay | Admitting: Physician Assistant

## 2012-07-18 VITALS — BP 159/102 | HR 71 | Ht 63.0 in | Wt 202.1 lb

## 2012-07-18 DIAGNOSIS — I10 Essential (primary) hypertension: Secondary | ICD-10-CM | POA: Diagnosis not present

## 2012-07-18 DIAGNOSIS — Z8673 Personal history of transient ischemic attack (TIA), and cerebral infarction without residual deficits: Secondary | ICD-10-CM

## 2012-07-18 DIAGNOSIS — I4891 Unspecified atrial fibrillation: Secondary | ICD-10-CM | POA: Diagnosis not present

## 2012-07-18 DIAGNOSIS — E039 Hypothyroidism, unspecified: Secondary | ICD-10-CM

## 2012-07-18 MED ORDER — WARFARIN SODIUM 7.5 MG PO TABS: ORAL_TABLET | ORAL | Status: DC

## 2012-07-18 NOTE — Progress Notes (Signed)
[address], [address] [place], [place]  [address] Phone: [phone], Fax:  [phone]  Date:  07/18/2012   Name:  Melissa Knight   DOB:  10-29-1932   MRN:  [id number]  PCP:  No primary provider on file.  Primary Cardiologist:  Dr. Tonny Bollman  Primary Electrophysiologist:  None    History of Present Illness: Melissa Knight is a 78 y.o. female who returns for follow up after recent hospital admission for acute right parietal stroke in the setting of newly diagnosed AFib.  She has a history of HTN and hypothyroidism. She was admitted to the hospital 10/19-10/23. She presented with left-sided weakness and difficulty with speech. CT of the head demonstrated right parietal stroke. She also had a generalized seizure on her way to the CT scanner and was intubated. ECG demonstrated atrial fibrillation which was new. She was seen by cardiology for her atrial fibrillation. It was felt that her stroke was likely cardioembolic. Anticoagulation with Xarelto was recommended. However, cost was felt to be prohibitive. She was transitioned to warfarin. Of note, carotid Dopplers were negative for ICA stenosis. Echocardiogram 06/30/12: EF 60-65%, mild MR, PASP 33.  Since d/c she is doing well.  The patient denies chest pain, shortness of breath, syncope, orthopnea, PND or significant pedal edema. She is in PT.  Her LUE weakness is much improved.    Labs (10/13):   K 4.1, creatinine 0.73, magnesium 1.8, LDL 156, Hgb 14.6  Wt Readings from Last 3 Encounters:  07/18/12 202 lb 1.9 oz (91.681 kg)  07/02/12 210 lb 8.6 oz (95.5 kg)     Past Medical History  Diagnosis Date  . Hypertension   . Hypothyroidism   . Atrial fibrillation 06/2012    Echocardiogram 06/30/12: EF 60-65%, mild MR, PASP 33.  Marland Kitchen Acute ischemic stroke 06/2012    acute right parietal ischemic stroke - likely cardioembolic in setting of AFib => coumadin started and followed by PCP  . Seizure 06/2012    in the setting of acute  ischemic stroke    Current Outpatient Prescriptions  Medication Sig Dispense Refill  . aspirin 325 MG EC tablet Take 325 mg by mouth daily.      Marland Kitchen atorvastatin (LIPITOR) 10 MG tablet Take 1 tablet (10 mg total) by mouth daily at 6 PM.  30 tablet  0  . furosemide (LASIX) 20 MG tablet Take 20 mg by mouth daily.      Marland Kitchen levETIRAcetam (KEPPRA) 500 MG tablet Take 1 tablet (500 mg total) by mouth 2 (two) times daily.  30 tablet  0  . levothyroxine (SYNTHROID, LEVOTHROID) 88 MCG tablet Take 88 mcg by mouth daily.      Marland Kitchen lisinopril (PRINIVIL,ZESTRIL) 10 MG tablet Take 10 mg by mouth daily.      . metoprolol (LOPRESSOR) 50 MG tablet Take 50 mg by mouth as directed. Take 2 tablets in the morning and 1 tablet in the bedtime      . niacin (NIASPAN) 500 MG CR tablet Take 500 mg by mouth at bedtime.      . potassium chloride SA (K-DUR,KLOR-CON) 20 MEQ tablet Take 1 tablet (20 mEq total) by mouth daily.  30 tablet  0  . warfarin (COUMADIN) 7.5 MG tablet Take 1 tablet (7.5 mg total) by mouth one time only at 6 PM. Take 7.5mg  10/23 and 10/24 then based on INR 10/25, Goal INR is 2-3  15 tablet  0  . [DISCONTINUED] metoprolol (LOPRESSOR) 50 MG tablet Take  1 tablet (50 mg total) by mouth 2 (two) times daily. Take 2 tablets in the morning and 1 tablet in the bedtime        Allergies: Allergies  Allergen Reactions  . Sulfa Antibiotics Rash    rash    Social History:  The patient  reports that she quit smoking about 33 years ago. Her smoking use included Cigarettes. She smoked 1 pack per day. She has never used smokeless tobacco. She reports that she drinks about .6 ounces of alcohol per week.   ROS:  Please see the history of present illness.   She notes an episode of epistaxis.   All other systems reviewed and negative.   PHYSICAL EXAM: VS:  BP 159/102  Pulse 71  Ht 5\' 3"  (1.6 m)  Wt 202 lb 1.9 oz (91.681 kg)  BMI 35.80 kg/m2 Well nourished, well developed, in no acute distress HEENT: normal Neck: no  JVD Endocrine:  No TM Cardiac:  normal S1, S2; irreg irreg rhythm; no murmur Lungs:  clear to auscultation bilaterally, no wheezing, rhonchi or rales Abd: soft, nontender, no hepatomegaly Ext: no edema Skin: warm and dry Neuro:  CNs 2-12 intact, no focal abnormalities noted  EKG:  AFib, HR 71      ASSESSMENT AND PLAN:  1. Atrial Fibrillation:   Rate controlled.  Coumadin managed by PCP.  She really does not need ASA with coumadin.  Not sure if this was continued due to recent CVA.  She can decrease to 81 mg QD.  ASA will likely be d/c at some point in the near future.    2. S/p Cardioembolic Stroke:  Recovering nicely.  We will contact Dr. Marlis Edelson office regarding follow up.  She currently does not have a follow up appt.  3. Hypertension:   Increase Lisinopril to 20 mg QD.  Check BMET in one week.  4. Hypothyroidism:   With AFib, will check TSH with BMET next week.  Signed, Tereso Newcomer, PA-C  2:51 PM 07/18/2012

## 2012-07-18 NOTE — Patient Instructions (Addendum)
Your physician recommends that you schedule a follow-up appointment in: 6 WEEKS WITH DR Excell Seltzer  Your physician has recommended you make the following change in your medication:  INCREASE  LISINOPRIL TO 20 MG  1 EVERY DAY  DECREASE ASPIRIN TO 81  MG   Your physician recommends that you return for lab work in: IN 1 WEEK AT PRIMARY  MEDICAL DOCTOR  BMET  AND TSH  DX  V58.69

## 2012-07-19 ENCOUNTER — Ambulatory Visit: Payer: Medicare Other | Admitting: Physical Therapy

## 2012-07-22 ENCOUNTER — Other Ambulatory Visit (HOSPITAL_COMMUNITY): Payer: Self-pay | Admitting: Internal Medicine

## 2012-07-24 ENCOUNTER — Encounter: Payer: Self-pay | Admitting: Cardiovascular Disease

## 2012-07-24 ENCOUNTER — Ambulatory Visit: Payer: Medicare Other | Admitting: Physical Therapy

## 2012-07-24 DIAGNOSIS — Z79899 Other long term (current) drug therapy: Secondary | ICD-10-CM | POA: Diagnosis not present

## 2012-07-24 DIAGNOSIS — I10 Essential (primary) hypertension: Secondary | ICD-10-CM | POA: Diagnosis not present

## 2012-07-24 DIAGNOSIS — E039 Hypothyroidism, unspecified: Secondary | ICD-10-CM | POA: Diagnosis not present

## 2012-07-24 DIAGNOSIS — I4891 Unspecified atrial fibrillation: Secondary | ICD-10-CM | POA: Diagnosis not present

## 2012-07-26 ENCOUNTER — Ambulatory Visit: Payer: Medicare Other | Admitting: Physical Therapy

## 2012-07-31 ENCOUNTER — Ambulatory Visit: Payer: Medicare Other | Admitting: Physical Therapy

## 2012-07-31 DIAGNOSIS — I4891 Unspecified atrial fibrillation: Secondary | ICD-10-CM | POA: Diagnosis not present

## 2012-08-01 ENCOUNTER — Other Ambulatory Visit (HOSPITAL_COMMUNITY): Payer: Self-pay | Admitting: Internal Medicine

## 2012-08-02 ENCOUNTER — Ambulatory Visit: Payer: Medicare Other | Admitting: Physical Therapy

## 2012-08-03 ENCOUNTER — Other Ambulatory Visit (HOSPITAL_COMMUNITY): Payer: Self-pay | Admitting: Internal Medicine

## 2012-08-06 ENCOUNTER — Ambulatory Visit: Payer: Medicare Other | Admitting: Physical Therapy

## 2012-08-07 DIAGNOSIS — I4891 Unspecified atrial fibrillation: Secondary | ICD-10-CM | POA: Diagnosis not present

## 2012-08-14 ENCOUNTER — Ambulatory Visit: Payer: Medicare Other | Attending: Family Medicine | Admitting: Physical Therapy

## 2012-08-14 DIAGNOSIS — M25519 Pain in unspecified shoulder: Secondary | ICD-10-CM | POA: Diagnosis not present

## 2012-08-14 DIAGNOSIS — IMO0001 Reserved for inherently not codable concepts without codable children: Secondary | ICD-10-CM | POA: Diagnosis not present

## 2012-08-14 DIAGNOSIS — M25619 Stiffness of unspecified shoulder, not elsewhere classified: Secondary | ICD-10-CM | POA: Diagnosis not present

## 2012-08-14 DIAGNOSIS — I6990 Unspecified sequelae of unspecified cerebrovascular disease: Secondary | ICD-10-CM | POA: Diagnosis not present

## 2012-08-14 DIAGNOSIS — I10 Essential (primary) hypertension: Secondary | ICD-10-CM | POA: Diagnosis not present

## 2012-08-14 DIAGNOSIS — R5381 Other malaise: Secondary | ICD-10-CM | POA: Insufficient documentation

## 2012-08-16 ENCOUNTER — Ambulatory Visit: Payer: Medicare Other | Admitting: Physical Therapy

## 2012-08-16 DIAGNOSIS — R5381 Other malaise: Secondary | ICD-10-CM | POA: Diagnosis not present

## 2012-08-16 DIAGNOSIS — IMO0001 Reserved for inherently not codable concepts without codable children: Secondary | ICD-10-CM | POA: Diagnosis not present

## 2012-08-16 DIAGNOSIS — M25519 Pain in unspecified shoulder: Secondary | ICD-10-CM | POA: Diagnosis not present

## 2012-08-16 DIAGNOSIS — M25619 Stiffness of unspecified shoulder, not elsewhere classified: Secondary | ICD-10-CM | POA: Diagnosis not present

## 2012-08-21 ENCOUNTER — Ambulatory Visit: Payer: Medicare Other | Admitting: Physical Therapy

## 2012-08-21 DIAGNOSIS — M25619 Stiffness of unspecified shoulder, not elsewhere classified: Secondary | ICD-10-CM | POA: Diagnosis not present

## 2012-08-21 DIAGNOSIS — M25519 Pain in unspecified shoulder: Secondary | ICD-10-CM | POA: Diagnosis not present

## 2012-08-21 DIAGNOSIS — R5381 Other malaise: Secondary | ICD-10-CM | POA: Diagnosis not present

## 2012-08-21 DIAGNOSIS — IMO0001 Reserved for inherently not codable concepts without codable children: Secondary | ICD-10-CM | POA: Diagnosis not present

## 2012-08-23 ENCOUNTER — Ambulatory Visit: Payer: Medicare Other | Admitting: Physical Therapy

## 2012-08-23 DIAGNOSIS — R5381 Other malaise: Secondary | ICD-10-CM | POA: Diagnosis not present

## 2012-08-23 DIAGNOSIS — M25619 Stiffness of unspecified shoulder, not elsewhere classified: Secondary | ICD-10-CM | POA: Diagnosis not present

## 2012-08-23 DIAGNOSIS — IMO0001 Reserved for inherently not codable concepts without codable children: Secondary | ICD-10-CM | POA: Diagnosis not present

## 2012-08-23 DIAGNOSIS — M25519 Pain in unspecified shoulder: Secondary | ICD-10-CM | POA: Diagnosis not present

## 2012-08-28 ENCOUNTER — Ambulatory Visit (INDEPENDENT_AMBULATORY_CARE_PROVIDER_SITE_OTHER): Payer: Medicare Other | Admitting: Cardiovascular Disease

## 2012-08-28 ENCOUNTER — Encounter: Payer: Self-pay | Admitting: Cardiovascular Disease

## 2012-08-28 VITALS — BP 119/75 | HR 71 | Ht 63.0 in | Wt 205.1 lb

## 2012-08-28 DIAGNOSIS — I4891 Unspecified atrial fibrillation: Secondary | ICD-10-CM | POA: Diagnosis not present
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
## 2012-08-28 NOTE — Patient Instructions (Signed)
STOP YOUR ASPIRIN  Your physician wants you to follow-up in: 6 MONTHS You will receive a reminder letter in the mail two months in advance. If you don't receive a letter, please call our office to schedule the follow-up appointment.

## 2012-08-29 DIAGNOSIS — I4891 Unspecified atrial fibrillation: Secondary | ICD-10-CM | POA: Diagnosis not present

## 2012-09-19 DIAGNOSIS — I824Z9 Acute embolism and thrombosis of unspecified deep veins of unspecified distal lower extremity: Secondary | ICD-10-CM | POA: Diagnosis not present

## 2012-10-03 DIAGNOSIS — I824Z9 Acute embolism and thrombosis of unspecified deep veins of unspecified distal lower extremity: Secondary | ICD-10-CM | POA: Diagnosis not present

## 2012-10-17 DIAGNOSIS — I4891 Unspecified atrial fibrillation: Secondary | ICD-10-CM | POA: Diagnosis not present

## 2012-10-31 DIAGNOSIS — I10 Essential (primary) hypertension: Secondary | ICD-10-CM | POA: Diagnosis not present

## 2012-10-31 DIAGNOSIS — E039 Hypothyroidism, unspecified: Secondary | ICD-10-CM | POA: Diagnosis not present

## 2012-10-31 DIAGNOSIS — I4891 Unspecified atrial fibrillation: Secondary | ICD-10-CM | POA: Diagnosis not present

## 2012-10-31 DIAGNOSIS — E785 Hyperlipidemia, unspecified: Secondary | ICD-10-CM | POA: Diagnosis not present

## 2012-10-31 LAB — HM MAMMOGRAPHY

## 2012-10-31 LAB — HM DEXA SCAN

## 2012-10-31 LAB — LIPID PANEL: Triglycerides: 55 mg/dL (ref 40–160)

## 2012-11-20 DIAGNOSIS — I10 Essential (primary) hypertension: Secondary | ICD-10-CM | POA: Diagnosis not present

## 2012-11-20 DIAGNOSIS — I4891 Unspecified atrial fibrillation: Secondary | ICD-10-CM | POA: Diagnosis not present

## 2012-11-20 LAB — POCT INR: INR: 1.4 — AB (ref 0.9–1.1)

## 2012-11-23 ENCOUNTER — Encounter: Payer: Self-pay | Admitting: *Deleted

## 2012-11-27 ENCOUNTER — Ambulatory Visit: Payer: Self-pay | Admitting: Physician Assistant

## 2012-12-05 ENCOUNTER — Ambulatory Visit (INDEPENDENT_AMBULATORY_CARE_PROVIDER_SITE_OTHER): Payer: Medicare Other | Admitting: Physician Assistant

## 2012-12-05 VITALS — BP 121/81 | HR 56 | Temp 97.0°F | Ht 63.0 in | Wt 206.4 lb

## 2012-12-05 DIAGNOSIS — I4891 Unspecified atrial fibrillation: Secondary | ICD-10-CM

## 2012-12-05 LAB — POCT INR: INR: 2.3

## 2012-12-05 NOTE — Progress Notes (Signed)
  Subjective:    Patient ID: Melissa Knight, female    DOB: 03-11-1933, 79 y.o.   MRN: [id number]  HPI Taking coumadin using half 7.5 mg tab every day except Weds, when she takes two 1mg  tabs   Review of Systems     Objective:   Physical Exam   INR 2.3     Assessment & Plan:  Afib/coumadin therapy  No changes Return 2 weeks

## 2012-12-20 ENCOUNTER — Ambulatory Visit (INDEPENDENT_AMBULATORY_CARE_PROVIDER_SITE_OTHER): Payer: Medicare Other | Admitting: Pharmacist

## 2012-12-20 DIAGNOSIS — I4891 Unspecified atrial fibrillation: Secondary | ICD-10-CM

## 2012-12-20 NOTE — Patient Instructions (Signed)
Anticoagulation Dose Instructions as of 12/20/2012     Glynis Smiles Tue Wed Thu Fri Sat   New Dose 3.75 mg 3.75 mg 2 mg 3.75 mg 3.75 mg 3.75 mg 3.75 mg    Description       1 tablet today, then resume same schedule of 1/2 tablet of 7.5mg  except 2mg  (= 2 tablets of 1mg ) on tuesdays     INR was 1.9 today

## 2012-12-24 ENCOUNTER — Ambulatory Visit: Payer: Medicare Other | Admitting: Nurse Practitioner

## 2013-01-07 ENCOUNTER — Ambulatory Visit (INDEPENDENT_AMBULATORY_CARE_PROVIDER_SITE_OTHER): Payer: Medicare Other | Admitting: Pharmacist

## 2013-01-07 DIAGNOSIS — I4891 Unspecified atrial fibrillation: Secondary | ICD-10-CM

## 2013-01-07 LAB — POCT INR: INR: 3.5

## 2013-01-07 NOTE — Patient Instructions (Signed)
Anticoagulation Dose Instructions as of 01/07/2013     Glynis Smiles Tue Wed Thu Fri Sat   New Dose 2 mg 3.75 mg 3.75 mg 2 mg 3.75 mg 3.75 mg 3.75 mg    Description       Hold 1 day, then start 2mg  wednesdays and sundays and 3.75mg  all other days       INR was 3.5 today

## 2013-01-21 ENCOUNTER — Ambulatory Visit (INDEPENDENT_AMBULATORY_CARE_PROVIDER_SITE_OTHER): Payer: Medicare Other | Admitting: Pharmacist Clinician (PhC)/ Clinical Pharmacy Specialist

## 2013-01-21 DIAGNOSIS — I4891 Unspecified atrial fibrillation: Secondary | ICD-10-CM

## 2013-01-21 LAB — POCT INR: INR: 1.6

## 2013-01-22 ENCOUNTER — Other Ambulatory Visit: Payer: Self-pay | Admitting: Nurse Practitioner

## 2013-01-23 NOTE — Telephone Encounter (Signed)
LAST LABS 2/14. 

## 2013-01-30 ENCOUNTER — Encounter: Payer: Self-pay | Admitting: Nurse Practitioner

## 2013-01-30 ENCOUNTER — Encounter (INDEPENDENT_AMBULATORY_CARE_PROVIDER_SITE_OTHER): Payer: Medicare Other | Admitting: Nurse Practitioner

## 2013-01-30 DIAGNOSIS — I4891 Unspecified atrial fibrillation: Secondary | ICD-10-CM | POA: Diagnosis not present

## 2013-01-30 DIAGNOSIS — I10 Essential (primary) hypertension: Secondary | ICD-10-CM | POA: Diagnosis not present

## 2013-01-30 LAB — COMPLETE METABOLIC PANEL WITH GFR
ALT: 14 U/L (ref 0–35)
AST: 21 U/L (ref 0–37)
CO2: 28 mEq/L (ref 19–32)
Chloride: 105 mEq/L (ref 96–112)
GFR, Est African American: >89 mL/min
Sodium: 141 mEq/L (ref 135–145)
Total Bilirubin: 0.6 mg/dL (ref 0.3–1.2)
Total Protein: 6.3 g/dL (ref 6.0–8.3)

## 2013-01-30 NOTE — Progress Notes (Signed)
Pt was worked up and sent to the lab- and was to come back down the hall and see MMM. Pt left after labs was drawn without seeing a provider.-jhb

## 2013-02-01 LAB — NMR LIPOPROFILE WITH LIPIDS
Cholesterol, Total: 181 mg/dL (ref <200)
HDL Particle Number: 35.1 umol/L (ref >=30.5)
HDL-C: 53 mg/dL (ref >=40)
LDL (calc): 101 mg/dL — ABNORMAL HIGH (ref <100)
LDL Size: 22 nm (ref >20.5)
LP-IR Score: 31 (ref <=45)
Large HDL-P: 8.8 umol/L (ref >=4.8)

## 2013-02-05 ENCOUNTER — Encounter: Payer: Self-pay | Admitting: Nurse Practitioner

## 2013-02-05 ENCOUNTER — Ambulatory Visit (INDEPENDENT_AMBULATORY_CARE_PROVIDER_SITE_OTHER): Payer: Medicare Other | Admitting: Nurse Practitioner

## 2013-02-05 VITALS — BP 122/81 | HR 65 | Temp 97.7°F | Ht 63.0 in | Wt 206.0 lb

## 2013-02-05 DIAGNOSIS — E785 Hyperlipidemia, unspecified: Secondary | ICD-10-CM | POA: Diagnosis not present

## 2013-02-05 DIAGNOSIS — E039 Hypothyroidism, unspecified: Secondary | ICD-10-CM

## 2013-02-05 DIAGNOSIS — I4891 Unspecified atrial fibrillation: Secondary | ICD-10-CM | POA: Diagnosis not present

## 2013-02-05 DIAGNOSIS — I10 Essential (primary) hypertension: Secondary | ICD-10-CM | POA: Diagnosis not present

## 2013-02-05 LAB — POCT INR: INR: 2.1

## 2013-02-05 NOTE — Patient Instructions (Addendum)

## 2013-02-05 NOTE — Progress Notes (Addendum)
Subjective:    Patient ID: Melissa Knight, female    DOB: [birth year], 79 y.o.   MRN: [id number]  Hyperlipidemia This is a chronic problem. The current episode started more than 1 year ago. The problem is controlled. Recent lipid tests were reviewed and are normal. Exacerbating diseases include hypothyroidism and obesity. There are no known factors aggravating her hyperlipidemia. Pertinent negatives include no chest pain, focal sensory loss, focal weakness, leg pain, myalgias or shortness of breath. Current antihyperlipidemic treatment includes statins. The current treatment provides significant improvement of lipids. Compliance problems include adherence to diet and adherence to exercise.  Risk factors for coronary artery disease include hypertension, obesity and post-menopausal.  Hypertension This is a chronic problem. The current episode started more than 1 year ago. The problem is unchanged. The problem is controlled. Associated symptoms include peripheral edema. Pertinent negatives include no blurred vision, chest pain, headaches, orthopnea, palpitations or shortness of breath. Agents associated with hypertension include thyroid hormones. Risk factors for coronary artery disease include obesity, post-menopausal state and sedentary lifestyle. Past treatments include calcium channel blockers, diuretics, ACE inhibitors and beta blockers. The current treatment provides significant improvement. Compliance problems include exercise and diet.  Hypertensive end-organ damage includes CAD/MI, CVA (06/2012) and a thyroid problem.  Thyroid Problem Presents for follow-up visit. Patient reports no anxiety, cold intolerance, constipation, diarrhea, dry skin, heat intolerance, hoarse voice, palpitations or visual change. The symptoms have been stable. Her past medical history is significant for hyperlipidemia.  Seizures  This is a new problem. The current episode started 12 to 24 hours ago. The problem has been  resolved. There was 1 seizure. The most recent episode lasted 2 to 5 minutes. Pertinent negatives include no headaches, no chest pain and no diarrhea. Characteristics include eye blinking, rhythmic jerking, loss of consciousness and bit tongue. Characteristics do not include eye deviation, bowel incontinence or bladder incontinence. The episode was witnessed. There was no sensation of an aura present. The seizures did not continue in the ED.      Review of Systems  HENT: Negative for hoarse voice.   Eyes: Negative for blurred vision.  Respiratory: Negative for shortness of breath.   Cardiovascular: Negative for chest pain, palpitations and orthopnea.  Gastrointestinal: Negative for diarrhea, constipation and bowel incontinence.  Endocrine: Negative for cold intolerance and heat intolerance.  Genitourinary: Negative for bladder incontinence.  Musculoskeletal: Negative for myalgias.  Neurological: Positive for seizures and loss of consciousness. Negative for focal weakness and headaches.  All other systems reviewed and are negative.       Objective:   Physical Exam  Constitutional: She is oriented to person, place, and time. She appears well-developed and well-nourished.  HENT:  Nose: Nose normal.  Mouth/Throat: Oropharynx is clear and moist.  Eyes: EOM are normal.  Neck: Trachea normal, normal range of motion and full passive range of motion without pain. Neck supple. No JVD present. Carotid bruit is not present. No thyromegaly present.  Cardiovascular: Normal rate, regular rhythm, normal heart sounds and intact distal pulses.  Exam reveals no gallop and no friction rub.   No murmur heard. Pulmonary/Chest: Effort normal and breath sounds normal.  Abdominal: Soft. Bowel sounds are normal. She exhibits no distension and no mass. There is no tenderness.  Musculoskeletal: Normal range of motion. She exhibits edema (1+ edema bil lower ext).  Lymphadenopathy:    She has no cervical  adenopathy.  Neurological: She is alert and oriented to person, place, and time. She has normal reflexes.  Skin: Skin is warm and dry.  Psychiatric: She has a normal mood and affect. Her behavior is normal. Judgment and thought content normal.   BP 122/81  Pulse 65  Temp(Src) 97.7 F (36.5 C) (Oral)  Ht 5\' 3"  (1.6 m)  Wt 206 lb (93.441 kg)  BMI 36.5 kg/m2        Assessment & Plan:  1. Hyperlipidemia Low fat diet and exercise  2. Hypertension Low NA+ diet  3. A-fib - POCT INR  4. Hypothyroidism  Continue all meds Labs pending ollow-up in 3 months  Mary-Margaret Daphine Deutscher, FNP

## 2013-02-07 ENCOUNTER — Other Ambulatory Visit: Payer: Self-pay | Admitting: *Deleted

## 2013-02-07 MED ORDER — METOPROLOL TARTRATE 50 MG PO TABS: ORAL_TABLET | ORAL | Status: DC

## 2013-02-07 MED ORDER — ATORVASTATIN CALCIUM 10 MG PO TABS: 10.0000 mg | ORAL_TABLET | Freq: Every day | ORAL | Status: DC

## 2013-03-11 ENCOUNTER — Ambulatory Visit (INDEPENDENT_AMBULATORY_CARE_PROVIDER_SITE_OTHER): Payer: Medicare Other | Admitting: Pharmacist

## 2013-03-11 DIAGNOSIS — I4891 Unspecified atrial fibrillation: Secondary | ICD-10-CM

## 2013-03-11 LAB — POCT INR: INR: 2.7

## 2013-03-11 NOTE — Patient Instructions (Signed)
Anticoagulation Dose Instructions as of 03/11/2013     Melissa Knight Tue Wed Thu Fri Sat   New Dose 3.75 mg 3.75 mg 3.75 mg 3.75 mg 3.75 mg 3.75 mg 3.75 mg    Description       Continue current dose of 1/2 tablet of 7.5mg  daily       INR was 2.7 today

## 2013-03-27 ENCOUNTER — Other Ambulatory Visit: Payer: Self-pay | Admitting: Family Medicine

## 2013-03-27 ENCOUNTER — Other Ambulatory Visit: Payer: Self-pay | Admitting: Physician Assistant

## 2013-03-28 NOTE — Telephone Encounter (Signed)
No Keppra level on Chart, does she need one

## 2013-04-06 ENCOUNTER — Other Ambulatory Visit: Payer: Self-pay | Admitting: Nurse Practitioner

## 2013-04-11 ENCOUNTER — Ambulatory Visit (INDEPENDENT_AMBULATORY_CARE_PROVIDER_SITE_OTHER): Payer: Medicare Other | Admitting: Pharmacist

## 2013-04-11 DIAGNOSIS — I4891 Unspecified atrial fibrillation: Secondary | ICD-10-CM

## 2013-04-11 LAB — POCT INR: INR: 2.3

## 2013-04-11 NOTE — Patient Instructions (Signed)
Anticoagulation Dose Instructions as of 04/11/2013     Melissa Knight Tue Wed Thu Fri Sat   New Dose 3.75 mg 3.75 mg 3.75 mg 3.75 mg 3.75 mg 3.75 mg 3.75 mg    Description       Continue current dose of 1/2 tablet of 7.5mg  daily       INR was 2.3 today

## 2013-04-20 ENCOUNTER — Other Ambulatory Visit: Payer: Self-pay | Admitting: Family Medicine

## 2013-04-29 ENCOUNTER — Other Ambulatory Visit: Payer: Self-pay | Admitting: *Deleted

## 2013-04-29 MED ORDER — ATORVASTATIN CALCIUM 10 MG PO TABS: 10.0000 mg | ORAL_TABLET | Freq: Every day | ORAL | Status: DC

## 2013-04-29 MED ORDER — LEVETIRACETAM 500 MG PO TABS: 500.0000 mg | ORAL_TABLET | Freq: Two times a day (BID) | ORAL | Status: DC

## 2013-04-29 MED ORDER — LISINOPRIL 20 MG PO TABS: ORAL_TABLET | ORAL | Status: DC

## 2013-04-29 MED ORDER — AMLODIPINE BESYLATE 5 MG PO TABS: 5.0000 mg | ORAL_TABLET | Freq: Every day | ORAL | Status: DC

## 2013-04-29 NOTE — Telephone Encounter (Signed)
Last seen 02/05/13  MMM 

## 2013-04-29 NOTE — Telephone Encounter (Signed)
Last seen 02/05/13   MMM

## 2013-05-01 ENCOUNTER — Encounter: Payer: Self-pay | Admitting: *Deleted

## 2013-05-02 ENCOUNTER — Other Ambulatory Visit: Payer: Self-pay | Admitting: Nurse Practitioner

## 2013-05-10 ENCOUNTER — Encounter: Payer: Self-pay | Admitting: Nurse Practitioner

## 2013-05-10 ENCOUNTER — Ambulatory Visit (INDEPENDENT_AMBULATORY_CARE_PROVIDER_SITE_OTHER): Payer: Medicare Other | Admitting: Nurse Practitioner

## 2013-05-10 VITALS — BP 115/81 | HR 76 | Temp 98.8°F | Ht 63.0 in | Wt 207.0 lb

## 2013-05-10 DIAGNOSIS — I4891 Unspecified atrial fibrillation: Secondary | ICD-10-CM | POA: Diagnosis not present

## 2013-05-10 DIAGNOSIS — I10 Essential (primary) hypertension: Secondary | ICD-10-CM

## 2013-05-10 DIAGNOSIS — E785 Hyperlipidemia, unspecified: Secondary | ICD-10-CM

## 2013-05-10 LAB — POCT INR: INR: 1.8

## 2013-05-10 NOTE — Patient Instructions (Addendum)
Anticoagulation Dose Instructions as of 05/10/2013     Glynis Smiles Tue Wed Thu Fri Sat   New Dose 3.75 mg 7.5 mg 3.75 mg 3.75 mg 3.75 mg 7.5 mg 3.75 mg    Description       7.5 mg bid and 3,75 all other days     Edema Edema is an abnormal build-up of fluids in tissues. Because this is partly dependent on gravity (water flows to the lowest place), it is more common in the legs and thighs (lower extremities). It is also common in the looser tissues, like around the eyes. Painless swelling of the feet and ankles is common and increases as a person ages. It may affect both legs and may include the calves or even thighs. When squeezed, the fluid may move out of the affected area and may leave a dent for a few moments. CAUSES   Prolonged standing or sitting in one place for extended periods of time. Movement helps pump tissue fluid into the veins, and absence of movement prevents this, resulting in edema.  Varicose veins. The valves in the veins do not work as well as they should. This causes fluid to leak into the tissues.  Fluid and salt overload.  Injury, burn, or surgery to the leg, ankle, or foot, may damage veins and allow fluid to leak out.  Sunburn damages vessels. Leaky vessels allow fluid to go out into the sunburned tissues.  Allergies (from insect bites or stings, medications or chemicals) cause swelling by allowing vessels to become leaky.  Protein in the blood helps keep fluid in your vessels. Low protein, as in malnutrition, allows fluid to leak out.  Hormonal changes, including pregnancy and menstruation, cause fluid retention. This fluid may leak out of vessels and cause edema.  Medications that cause fluid retention. Examples are sex hormones, blood pressure medications, steroid treatment, or anti-depressants.  Some illnesses cause edema, especially heart failure, kidney disease, or liver disease.  Surgery that cuts veins or lymph nodes, such as surgery done for the heart or  for breast cancer, may result in edema. DIAGNOSIS  Your caregiver is usually easily able to determine what is causing your swelling (edema) by simply asking what is wrong (getting a history) and examining you (doing a physical). Sometimes x-rays, EKG (electrocardiogram or heart tracing), and blood work may be done to evaluate for underlying medical illness. TREATMENT  General treatment includes:  Leg elevation (or elevation of the affected body part).  Restriction of fluid intake.  Prevention of fluid overload.  Compression of the affected body part. Compression with elastic bandages or support stockings squeezes the tissues, preventing fluid from entering and forcing it back into the blood vessels.  Diuretics (also called water pills or fluid pills) pull fluid out of your body in the form of increased urination. These are effective in reducing the swelling, but can have side effects and must be used only under your caregiver's supervision. Diuretics are appropriate only for some types of edema. The specific treatment can be directed at any underlying causes discovered. Heart, liver, or kidney disease should be treated appropriately. HOME CARE INSTRUCTIONS   Elevate the legs (or affected body part) above the level of the heart, while lying down.  Avoid sitting or standing still for prolonged periods of time.  Avoid putting anything directly under the knees when lying down, and do not wear constricting clothing or garters on the upper legs.  Exercising the legs causes the fluid to work back  into the veins and lymphatic channels. This may help the swelling go down.  The pressure applied by elastic bandages or support stockings can help reduce ankle swelling.  A low-salt diet may help reduce fluid retention and decrease the ankle swelling.  Take any medications exactly as prescribed. SEEK MEDICAL CARE IF:  Your edema is not responding to recommended treatments. SEEK IMMEDIATE MEDICAL  CARE IF:   You develop shortness of breath or chest pain.  You cannot breathe when you lay down; or if, while lying down, you have to get up and go to the window to get your breath.  You are having increasing swelling without relief from treatment.  You develop a fever over 102 F (38.9 C).  You develop pain or redness in the areas that are swollen.  Tell your caregiver right away if you have gained 3 lb/1.4 kg in 1 day or 5 lb/2.3 kg in a week. MAKE SURE YOU:   Understand these instructions.  Will watch your condition.  Will get help right away if you are not doing well or get worse. Document Released: 08/29/2005 Document Revised: 02/28/2012 Document Reviewed: 04/16/2008 [facility] Patient Information 2014 [place], [place].

## 2013-05-10 NOTE — Progress Notes (Signed)
Subjective:    Patient ID: Melissa Knight, female    DOB: 09-17-1932, 79 y.o.   MRN: [id number]  Hyperlipidemia This is a chronic problem. The current episode started more than 1 year ago. The problem is controlled. Recent lipid tests were reviewed and are normal. Exacerbating diseases include hypothyroidism and obesity. There are no known factors aggravating her hyperlipidemia. Pertinent negatives include no chest pain, focal sensory loss, focal weakness, leg pain, myalgias or shortness of breath. Current antihyperlipidemic treatment includes statins. The current treatment provides significant improvement of lipids. Compliance problems include adherence to diet and adherence to exercise.  Risk factors for coronary artery disease include hypertension, obesity and post-menopausal.  Hypertension This is a chronic problem. The current episode started more than 1 year ago. The problem is unchanged. The problem is controlled. Associated symptoms include peripheral edema. Pertinent negatives include no blurred vision, chest pain, headaches, orthopnea, palpitations or shortness of breath. Agents associated with hypertension include thyroid hormones. Risk factors for coronary artery disease include obesity, post-menopausal state and sedentary lifestyle. Past treatments include calcium channel blockers, diuretics, ACE inhibitors and beta blockers. The current treatment provides significant improvement. Compliance problems include exercise and diet.  Hypertensive end-organ damage includes CAD/MI, CVA (06/2012) and a thyroid problem.  Thyroid Problem Presents for follow-up visit. Patient reports no anxiety, cold intolerance, constipation, diarrhea, dry skin, heat intolerance, hoarse voice, palpitations or visual change. The symptoms have been stable. Her past medical history is significant for hyperlipidemia.  Seizures  This is a new problem. The current episode started 12 to 24 hours ago. The problem has been  resolved. There was 1 seizure. The most recent episode lasted 2 to 5 minutes. Pertinent negatives include no headaches, no chest pain and no diarrhea. Characteristics include eye blinking, rhythmic jerking, loss of consciousness and bit tongue. Characteristics do not include eye deviation, bowel incontinence or bladder incontinence. The episode was witnessed. There was no sensation of an aura present. The seizures did not continue in the ED.      Review of Systems  HENT: Negative for hoarse voice.   Eyes: Negative for blurred vision.  Respiratory: Negative for shortness of breath.   Cardiovascular: Negative for chest pain, palpitations and orthopnea.  Gastrointestinal: Negative for diarrhea, constipation and bowel incontinence.  Endocrine: Negative for cold intolerance and heat intolerance.  Genitourinary: Negative for bladder incontinence.  Musculoskeletal: Negative for myalgias.  Neurological: Positive for seizures and loss of consciousness. Negative for focal weakness and headaches.  All other systems reviewed and are negative.       Objective:   Physical Exam  Constitutional: She is oriented to person, place, and time. She appears well-developed and well-nourished.  HENT:  Nose: Nose normal.  Mouth/Throat: Oropharynx is clear and moist.  Eyes: EOM are normal.  Neck: Trachea normal, normal range of motion and full passive range of motion without pain. Neck supple. No JVD present. Carotid bruit is not present. No thyromegaly present.  Cardiovascular: Normal rate, regular rhythm, normal heart sounds and intact distal pulses.  Exam reveals no gallop and no friction rub.   No murmur heard. Pulmonary/Chest: Effort normal and breath sounds normal.  Abdominal: Soft. Bowel sounds are normal. She exhibits no distension and no mass. There is no tenderness.  Musculoskeletal: Normal range of motion. She exhibits edema (1+ edema bil lower ext).  Lymphadenopathy:    She has no cervical  adenopathy.  Neurological: She is alert and oriented to person, place, and time. She has normal reflexes.  Skin: Skin is warm and dry.  Psychiatric: She has a normal mood and affect. Her behavior is normal. Judgment and thought content normal.   BP 115/81  Pulse 76  Temp(Src) 98.8 F (37.1 C) (Oral)  Ht 5\' 3"  (1.6 m)  Wt 207 lb (93.895 kg)  BMI 36.68 kg/m2        Assessment & Plan:  1. Hyperlipidemia Low fat diet and exercise  2. Hypertension Low NA+ diet  3. A-fib - POCT INR -instructions printed out for new dosing Repeat labs in 2 weeks  4. Hypothyroidism 5. Peripheral edema -take lasix daily Elevate legs when sitting  Continue all meds Outpatient Encounter Prescriptions as of 05/10/2013  Medication Sig Dispense Refill  . amLODipine (NORVASC) 5 MG tablet Take 1 tablet (5 mg total) by mouth daily.  90 tablet  0  . atorvastatin (LIPITOR) 10 MG tablet Take 1 tablet (10 mg total) by mouth daily at 6 PM.  90 tablet  0  . betamethasone dipropionate (DIPROLENE) 0.05 % cream       . furosemide (LASIX) 20 MG tablet Take 20 mg by mouth as needed.       . levETIRAcetam (KEPPRA) 500 MG tablet Take 1 tablet (500 mg total) by mouth 2 (two) times daily.  60 tablet  0  . levothyroxine (SYNTHROID, LEVOTHROID) 88 MCG tablet Take 88 mcg by mouth daily.      Marland Kitchen lisinopril (PRINIVIL,ZESTRIL) 20 MG tablet Take as directed  180 tablet  3  . metoprolol (LOPRESSOR) 50 MG tablet TAKE 2 TABLETS IN AM AND 1 AT BEDTIME  270 tablet  0  . niacin (NIASPAN) 500 MG CR tablet TAKE 1 TABLET DAILY AT BEDTIME  90 tablet  0  . warfarin (COUMADIN) 7.5 MG tablet AS DIRECTED  15 tablet  0  . [DISCONTINUED] atorvastatin (LIPITOR) 10 MG tablet TAKE 1 TABLET (10 MG TOTAL) BY MOUTH DAILY AT 6 PM.  30 tablet  2  . [DISCONTINUED] lisinopril (PRINIVIL,ZESTRIL) 10 MG tablet Take 20 mg by mouth 2 (two) times daily.        No facility-administered encounter medications on file as of 05/10/2013.    Labs  pending follow-up in 3 months  Mary-Margaret Daphine Deutscher, FNP

## 2013-05-12 LAB — NMR, LIPOPROFILE
HDL Cholesterol by NMR: 57 mg/dL (ref >=40)
HDL Particle Number: 36 umol/L (ref >=30.5)
LDL Size: 22.2 nm (ref >20.5)
LP-IR Score: 49 — ABNORMAL HIGH (ref <=45)
Small LDL Particle Number: 261 nmol/L (ref <=527)

## 2013-05-12 LAB — CMP14+EGFR
ALT: 17 IU/L (ref 0–32)
AST: 22 IU/L (ref 0–40)
Albumin/Globulin Ratio: 2 (ref 1.1–2.5)
Alkaline Phosphatase: 60 IU/L (ref 39–117)
BUN/Creatinine Ratio: 17 (ref 11–26)
Creatinine, Ser: 0.71 mg/dL (ref 0.57–1.00)
GFR calc Af Amer: 94 mL/min/{1.73_m2} (ref >59)
GFR calc non Af Amer: 82 mL/min/{1.73_m2} (ref >59)
Globulin, Total: 2.1 g/dL (ref 1.5–4.5)
Potassium: 4.8 mmol/L (ref 3.5–5.2)
Sodium: 143 mmol/L (ref 134–144)
Total Bilirubin: 0.4 mg/dL (ref 0.0–1.2)

## 2013-05-15 ENCOUNTER — Ambulatory Visit: Payer: Medicare Other | Admitting: Nurse Practitioner

## 2013-05-27 ENCOUNTER — Ambulatory Visit (INDEPENDENT_AMBULATORY_CARE_PROVIDER_SITE_OTHER): Payer: Medicare Other | Admitting: Pharmacist

## 2013-05-27 DIAGNOSIS — I4891 Unspecified atrial fibrillation: Secondary | ICD-10-CM | POA: Diagnosis not present

## 2013-05-27 LAB — POCT INR: INR: 3.4

## 2013-05-27 NOTE — Patient Instructions (Signed)
Anticoagulation Dose Instructions as of 05/27/2013     Melissa Knight Tue Wed Thu Fri Sat   New Dose 3.75 mg 7.5 mg 3.75 mg 3.75 mg 3.75 mg 3.75 mg 3.75 mg    Description       No warfarin today then decrease to 3.75mg  daily except 7.5 mg on mondays.        INR was 3.4 today

## 2013-05-28 NOTE — Progress Notes (Signed)
Addressed by clinical pharmacist

## 2013-06-20 ENCOUNTER — Other Ambulatory Visit: Payer: Self-pay | Admitting: Nurse Practitioner

## 2013-06-22 ENCOUNTER — Other Ambulatory Visit: Payer: Self-pay | Admitting: Nurse Practitioner

## 2013-06-24 NOTE — Telephone Encounter (Signed)
Last seen 05/10/13 MMM

## 2013-06-25 ENCOUNTER — Other Ambulatory Visit: Payer: Self-pay | Admitting: Nurse Practitioner

## 2013-06-27 ENCOUNTER — Ambulatory Visit (INDEPENDENT_AMBULATORY_CARE_PROVIDER_SITE_OTHER): Payer: Medicare Other | Admitting: Pharmacist

## 2013-06-27 ENCOUNTER — Encounter (INDEPENDENT_AMBULATORY_CARE_PROVIDER_SITE_OTHER): Payer: Self-pay

## 2013-06-27 DIAGNOSIS — I4891 Unspecified atrial fibrillation: Secondary | ICD-10-CM

## 2013-06-27 NOTE — Patient Instructions (Signed)
Anticoagulation Dose Instructions as of 06/27/2013     Melissa Knight Tue Wed Thu Fri Sat   New Dose 3.75 mg 7.5 mg 3.75 mg 3.75 mg 3.75 mg 3.75 mg 3.75 mg    Description       Continue 3.75mg  daily except 7.5 mg on mondays.        INR was 2.6 today

## 2013-07-20 ENCOUNTER — Other Ambulatory Visit: Payer: Self-pay | Admitting: Nurse Practitioner

## 2013-07-22 ENCOUNTER — Ambulatory Visit (INDEPENDENT_AMBULATORY_CARE_PROVIDER_SITE_OTHER): Payer: Medicare Other | Admitting: Pharmacist

## 2013-07-22 ENCOUNTER — Other Ambulatory Visit: Payer: Self-pay | Admitting: Nurse Practitioner

## 2013-07-22 DIAGNOSIS — I4891 Unspecified atrial fibrillation: Secondary | ICD-10-CM | POA: Diagnosis not present

## 2013-07-22 NOTE — Patient Instructions (Signed)
Anticoagulation Dose Instructions as of 07/22/2013     Melissa Knight Tue Wed Thu Fri Sat   New Dose 3.75 mg 7.5 mg 3.75 mg 3.75 mg 3.75 mg 3.75 mg 3.75 mg    Description       Continue 3.75mg  daily except 7.5 mg on mondays.        INR was 2.3 today

## 2013-08-07 ENCOUNTER — Other Ambulatory Visit: Payer: Self-pay | Admitting: Nurse Practitioner

## 2013-08-14 ENCOUNTER — Ambulatory Visit (INDEPENDENT_AMBULATORY_CARE_PROVIDER_SITE_OTHER): Payer: Medicare Other | Admitting: Nurse Practitioner

## 2013-08-14 ENCOUNTER — Encounter: Payer: Self-pay | Admitting: Nurse Practitioner

## 2013-08-14 VITALS — BP 129/85 | HR 68 | Temp 98.5°F | Ht 63.0 in | Wt 214.0 lb

## 2013-08-14 DIAGNOSIS — R5381 Other malaise: Secondary | ICD-10-CM

## 2013-08-14 DIAGNOSIS — G40319 Generalized idiopathic epilepsy and epileptic syndromes, intractable, without status epilepticus: Secondary | ICD-10-CM

## 2013-08-14 DIAGNOSIS — E538 Deficiency of other specified B group vitamins: Secondary | ICD-10-CM | POA: Diagnosis not present

## 2013-08-14 DIAGNOSIS — E785 Hyperlipidemia, unspecified: Secondary | ICD-10-CM | POA: Diagnosis not present

## 2013-08-14 DIAGNOSIS — I10 Essential (primary) hypertension: Secondary | ICD-10-CM

## 2013-08-14 DIAGNOSIS — R5383 Other fatigue: Secondary | ICD-10-CM

## 2013-08-14 DIAGNOSIS — I4891 Unspecified atrial fibrillation: Secondary | ICD-10-CM

## 2013-08-14 MED ORDER — WARFARIN SODIUM 7.5 MG PO TABS: 7.5000 mg | ORAL_TABLET | Freq: Once | ORAL | Status: DC

## 2013-08-14 MED ORDER — AMLODIPINE BESYLATE 5 MG PO TABS: 5.0000 mg | ORAL_TABLET | Freq: Every day | ORAL | Status: DC

## 2013-08-14 MED ORDER — ATORVASTATIN CALCIUM 10 MG PO TABS: 10.0000 mg | ORAL_TABLET | Freq: Every day | ORAL | Status: DC

## 2013-08-14 NOTE — Progress Notes (Signed)
Subjective:    Patient ID: Melissa Knight, female    DOB: 1932-12-30, 79 y.o.   MRN: [id number]  Hyperlipidemia This is a chronic problem. The current episode started more than 1 year ago. The problem is controlled. Recent lipid tests were reviewed and are normal. Exacerbating diseases include hypothyroidism and obesity. There are no known factors aggravating her hyperlipidemia. Pertinent negatives include no chest pain, focal sensory loss, focal weakness, leg pain, myalgias or shortness of breath. Current antihyperlipidemic treatment includes statins. The current treatment provides significant improvement of lipids. Compliance problems include adherence to diet and adherence to exercise.  Risk factors for coronary artery disease include hypertension, obesity and post-menopausal.  Hypertension This is a chronic problem. The current episode started more than 1 year ago. The problem is unchanged. The problem is controlled. Associated symptoms include peripheral edema. Pertinent negatives include no blurred vision, chest pain, headaches, orthopnea, palpitations or shortness of breath. Agents associated with hypertension include thyroid hormones. Risk factors for coronary artery disease include obesity, post-menopausal state and sedentary lifestyle. Past treatments include calcium channel blockers, diuretics, ACE inhibitors and beta blockers. The current treatment provides significant improvement. Compliance problems include exercise and diet.  Hypertensive end-organ damage includes CAD/MI, CVA (06/2012) and a thyroid problem.  Thyroid Problem Presents for follow-up visit. Patient reports no anxiety, cold intolerance, constipation, diarrhea, dry skin, heat intolerance, hoarse voice, palpitations or visual change. The symptoms have been stable. Her past medical history is significant for hyperlipidemia.  Seizures  This is a new problem. The current episode started 12 to 24 hours ago. The problem has been  resolved. There was 1 seizure. The most recent episode lasted 2 to 5 minutes. Pertinent negatives include no headaches, no chest pain and no diarrhea. Characteristics include eye blinking, rhythmic jerking, loss of consciousness and bit tongue. Characteristics do not include eye deviation, bowel incontinence or bladder incontinence. The episode was witnessed. There was no sensation of an aura present. The seizures did not continue in the ED.  atrial fib Coumadin level to be checked today  * patient c/o fatigue  Review of Systems  HENT: Negative for hoarse voice.   Eyes: Negative for blurred vision.  Respiratory: Negative for shortness of breath.   Cardiovascular: Negative for chest pain, palpitations and orthopnea.  Gastrointestinal: Negative for diarrhea, constipation and bowel incontinence.  Endocrine: Negative for cold intolerance and heat intolerance.  Genitourinary: Negative for bladder incontinence.  Musculoskeletal: Negative for myalgias.  Neurological: Positive for seizures and loss of consciousness. Negative for focal weakness and headaches.  All other systems reviewed and are negative.       Objective:   Physical Exam  Constitutional: She is oriented to person, place, and time. She appears well-developed and well-nourished.  HENT:  Nose: Nose normal.  Mouth/Throat: Oropharynx is clear and moist.  Eyes: EOM are normal.  Neck: Trachea normal, normal range of motion and full passive range of motion without pain. Neck supple. No JVD present. Carotid bruit is not present. No thyromegaly present.  Cardiovascular: Normal rate, regular rhythm, normal heart sounds and intact distal pulses.  Exam reveals no gallop and no friction rub.   No murmur heard. Pulmonary/Chest: Effort normal and breath sounds normal.  Abdominal: Soft. Bowel sounds are normal. She exhibits no distension and no mass. There is no tenderness.  Musculoskeletal: Normal range of motion. She exhibits edema (1+ edema  bil lower ext).  Lymphadenopathy:    She has no cervical adenopathy.  Neurological: She is alert and oriented  to person, place, and time. She has normal reflexes.  Skin: Skin is warm and dry.  Psychiatric: She has a normal mood and affect. Her behavior is normal. Judgment and thought content normal.   BP 129/85  Pulse 68  Temp(Src) 98.5 F (36.9 C) (Oral)  Ht 5\' 3"  (1.6 m)  Wt 214 lb (97.07 kg)  BMI 37.92 kg/m2        Assessment & Plan:   1. A-fib   2. Seizure disorder, generalized convulsive, intractable   3. Hyperlipidemia   4. HTN (hypertension), malignant   5. Atrial fibrillation   6. Fatigue    Orders Placed This Encounter  Procedures  . CMP14+EGFR  . NMR, lipoprofile  . Anemia Profile B   Meds ordered this encounter  Medications  . warfarin (COUMADIN) 7.5 MG tablet    Sig: Take 1 tablet (7.5 mg total) by mouth one time only at 6 PM.    Dispense:  90 tablet    Refill:  2    Order Specific Question:  Supervising Provider    Answer:  Ernestina Penna [[id number]]  . amLODipine (NORVASC) 5 MG tablet    Sig: Take 1 tablet (5 mg total) by mouth daily.    Dispense:  90 tablet    Refill:  1    Order Specific Question:  Supervising Provider    Answer:  Ernestina Penna [[id number]]  . atorvastatin (LIPITOR) 10 MG tablet    Sig: Take 1 tablet (10 mg total) by mouth daily at 6 PM.    Dispense:  90 tablet    Refill:  1    Order Specific Question:  Supervising Provider    Answer:  Deborra Medina    Continue all meds Labs pending Diet and exercise encouraged Health maintenance reviewed Follow up in 3 months  Mary-Margaret Daphine Deutscher, FNP

## 2013-08-14 NOTE — Patient Instructions (Signed)

## 2013-08-16 LAB — NMR, LIPOPROFILE
Cholesterol: 182 mg/dL (ref <200)
HDL Cholesterol by NMR: 59 mg/dL (ref >=40)
HDL Particle Number: 37.9 umol/L (ref >=30.5)
LDLC SERPL CALC-MCNC: 86 mg/dL (ref <100)
Small LDL Particle Number: 238 nmol/L (ref <=527)

## 2013-08-16 LAB — ANEMIA PROFILE B
Basophils Absolute: 0 10*3/uL (ref 0.0–0.2)
Eos: 3 %
Ferritin: 196 ng/mL — ABNORMAL HIGH (ref 15–150)
Folate: 6.7 ng/mL (ref >3.0)
Immature Grans (Abs): 0 10*3/uL (ref 0.0–0.1)
Immature Granulocytes: 0 %
Iron Saturation: 21 % (ref 15–55)
Lymphocytes Absolute: 2.3 10*3/uL (ref 0.7–3.1)
Lymphs: 31 %
MCV: 91 fL (ref 79–97)
Monocytes: 12 %
Neutrophils Absolute: 4.1 10*3/uL (ref 1.4–7.0)
Neutrophils Relative %: 54 %
Platelets: 168 10*3/uL (ref 150–379)
RBC: 4.63 x10E6/uL (ref 3.77–5.28)
RDW: 14 % (ref 12.3–15.4)
Retic Ct Pct: 1.4 % (ref 0.6–2.6)
TIBC: 236 ug/dL — ABNORMAL LOW (ref 250–450)
WBC: 7.7 10*3/uL (ref 3.4–10.8)

## 2013-08-16 LAB — CMP14+EGFR
AST: 22 IU/L (ref 0–40)
Albumin/Globulin Ratio: 2.6 — ABNORMAL HIGH (ref 1.1–2.5)
Albumin: 4.2 g/dL (ref 3.5–4.8)
Alkaline Phosphatase: 55 IU/L (ref 39–117)
BUN/Creatinine Ratio: 14 (ref 11–26)
BUN: 11 mg/dL (ref 8–27)
CO2: 23 mmol/L (ref 18–29)
Chloride: 104 mmol/L (ref 97–108)
Creatinine, Ser: 0.8 mg/dL (ref 0.57–1.00)
GFR calc non Af Amer: 71 mL/min/{1.73_m2} (ref >59)
Globulin, Total: 1.6 g/dL (ref 1.5–4.5)
Glucose: 113 mg/dL — ABNORMAL HIGH (ref 65–99)
Sodium: 142 mmol/L (ref 134–144)
Total Protein: 5.8 g/dL — ABNORMAL LOW (ref 6.0–8.5)

## 2013-08-19 ENCOUNTER — Ambulatory Visit (INDEPENDENT_AMBULATORY_CARE_PROVIDER_SITE_OTHER): Payer: Medicare Other | Admitting: Physician Assistant

## 2013-08-19 ENCOUNTER — Encounter: Payer: Self-pay | Admitting: Physician Assistant

## 2013-08-19 VITALS — BP 110/82 | HR 65 | Ht 63.0 in | Wt 214.0 lb

## 2013-08-19 DIAGNOSIS — E785 Hyperlipidemia, unspecified: Secondary | ICD-10-CM

## 2013-08-19 DIAGNOSIS — I6350 Cerebral infarction due to unspecified occlusion or stenosis of unspecified cerebral artery: Secondary | ICD-10-CM | POA: Diagnosis not present

## 2013-08-19 DIAGNOSIS — I10 Essential (primary) hypertension: Secondary | ICD-10-CM

## 2013-08-19 DIAGNOSIS — I639 Cerebral infarction, unspecified: Secondary | ICD-10-CM

## 2013-08-19 DIAGNOSIS — I4891 Unspecified atrial fibrillation: Secondary | ICD-10-CM

## 2013-08-19 MED ORDER — AMLODIPINE BESYLATE 5 MG PO TABS: 2.5000 mg | ORAL_TABLET | Freq: Every day | ORAL | Status: DC

## 2013-08-19 NOTE — Patient Instructions (Signed)
DECREASE NORVASC TO 2.5 MG DAILY (THIS IS A 1/2 TABLET DAILY); IF YOU SEE YOUR BP IS GOING 150/90 OR ABOVE THEN GO BACK TO THE 5 MG DAILY; IF YOUR BP IS DOING FINE ON THE 2.5 MG DAILY CALL ME AND WE WILL CALL YOU IN A NEW RX FOR THE 2.5 MG TABLET.  MAKE SURE TO KEEP YOUR LEGS ELEVATED WHEN SITTING DOWN  PICK UP SOME OTC COMPRESSION STOCKINGS; WEAR THEM FROM THE TIME YOU WAKE UP UNTIL YOU GO TO BED.  Your physician wants you to follow-up in: 6 MONTHS WITH DR. Excell Seltzer. You will receive a reminder letter in the mail two months in advance. If you don't receive a letter, please call our office to schedule the follow-up appointment.

## 2013-08-19 NOTE — Progress Notes (Signed)
[address] [place], [place]  [address] Phone: [phone] Fax:  [phone]  Date:  08/19/2013   ID:  Melissa Knight, DOB 11/27/1932, MRN [id number]  PCP:  Rudi Heap, MD  Cardiologist:  Dr. Tonny Bollman     History of Present Illness: Melissa Knight is a 79 y.o. female with a hx of permanent AFib, HTN and hypothyroidism. She was admitted to the hospital 06/2012 with a right brain stroke in the setting of atrial fibrillation which was new.  It was felt that her stroke was likely cardioembolic. She was anticoagulated with Xarelto but transitioned to warfarin due to cost. Of note, carotid Dopplers were negative for ICA stenosis. Echocardiogram 06/30/12: EF 60-65%, mild MR, PASP 33. Since d/c she is doing well.  Last seen by Dr. Excell Seltzer 08/2012.    She is doing well.  She is seen with her husband.  She has some chronic LE edema that seems to be dependent.  She denies significant dyspnea.  She denies chest pain, orthopnea, PND, syncope.  She has occasional rapid palpitations if she over-exerts herself.    Recent Labs: 10/31/2012: HDL 36  01/30/2013: LDL (calc) 101*  08/14/2013: ALT 14; Creatinine 0.80; Hemoglobin 14.3; Potassium 4.2   Wt Readings from Last 3 Encounters:  08/14/13 214 lb (97.07 kg)  05/10/13 207 lb (93.895 kg)  02/05/13 206 lb (93.441 kg)     Past Medical History  Diagnosis Date  . Hypertension   . Hypothyroidism   . Atrial fibrillation 06/2012    Echocardiogram 06/30/12: EF 60-65%, mild MR, PASP 33.  Marland Kitchen Acute ischemic stroke 06/2012    acute right parietal ischemic stroke - likely cardioembolic in setting of AFib => coumadin started and followed by PCP  . Seizure 06/2012    in the setting of acute ischemic stroke    Current Outpatient Prescriptions  Medication Sig Dispense Refill  . amLODipine (NORVASC) 5 MG tablet Take 1 tablet (5 mg total) by mouth daily.  90 tablet  1  . atorvastatin (LIPITOR) 10 MG tablet Take 1 tablet (10 mg total) by mouth  daily at 6 PM.  90 tablet  1  . betamethasone dipropionate (DIPROLENE) 0.05 % cream       . furosemide (LASIX) 20 MG tablet Take 20 mg by mouth daily.       Marland Kitchen levETIRAcetam (KEPPRA) 500 MG tablet TAKE 1 TABLET (500 MG TOTAL) BY MOUTH 2 (TWO) TIMES DAILY.  60 tablet  2  . levothyroxine (SYNTHROID, LEVOTHROID) 88 MCG tablet TAKE 1 TABLET EVERY DAY  90 tablet  1  . lisinopril (PRINIVIL,ZESTRIL) 20 MG tablet Take as directed  180 tablet  3  . metoprolol (LOPRESSOR) 50 MG tablet TAKE 2 TABLETS IN AM AND 1 AT BEDTIME  270 tablet  0  . niacin (NIASPAN) 500 MG CR tablet TAKE 1 TABLET DAILY AT BEDTIME  90 tablet  0  . warfarin (COUMADIN) 7.5 MG tablet Take 1 tablet (7.5 mg total) by mouth one time only at 6 PM.  90 tablet  2   No current facility-administered medications for this visit.    Allergies:   Sulfa antibiotics   Social History:  The patient  reports that she quit smoking about 34 years ago. Her smoking use included Cigarettes. She smoked 1.00 pack per day. She has never used smokeless tobacco. She reports that she drinks about 0.6 ounces of alcohol per week. She reports that she does not use illicit drugs.   Family  History:  The patient's family history includes Atrial fibrillation in her mother; Heart disease in her sister.   ROS:  Please see the history of present illness.   No bleeding problems.   All other systems reviewed and negative.   PHYSICAL EXAM: VS:  BP 110/82  Pulse 65  Ht 5\' 3"  (1.6 m)  Wt 214 lb (97.07 kg)  BMI 37.92 kg/m2 Well nourished, well developed, in no acute distress HEENT: normal Neck: no JVD Vascular:  No carotid bruits Cardiac:  normal S1, S2; irregularly irregular; no murmur Lungs:  clear to auscultation bilaterally, no wheezing, rhonchi or rales Abd: soft, nontender, no hepatomegaly Ext: trace to 1+ bilateral LE edema Skin: warm and dry Neuro:  CNs 2-12 intact, no focal abnormalities noted  EKG:  AFib, HR 65, low voltage, NSSTTW changes      ASSESSMENT AND PLAN:  1. Atrial Fibrillation:  Rate controlled.  Coumadin is managed by PCP.  Continue current Rx.   2. Hypertension:  Controlled.   3. Edema:  This is dependent edema.  She takes Lasix daily and labs are followed by her PCP. I have suggested she decrease her Norvasc to 2.5 mg QD to see if this helps.  She will monitor her BP.  If becomes uncontrolled she will resume Norvasc 5 mg.  I have also suggested OTC compression stockings.   4. Hyperlipidemia:  Continue statin.  Labs monitored by PCP. 5. Prior CVA:  She questions if she can stop her antiepileptic medication.  I have recommended she f/u with neurology.  6. Disposition:  F/u with Dr. Tonny Bollman in 6 mos.   Signed, Tereso Newcomer, PA-C  08/19/2013 2:07 PM

## 2013-09-19 ENCOUNTER — Ambulatory Visit (INDEPENDENT_AMBULATORY_CARE_PROVIDER_SITE_OTHER): Payer: Medicare Other | Admitting: Pharmacist

## 2013-09-19 VITALS — BP 132/84 | HR 80 | Ht 63.0 in | Wt 214.0 lb

## 2013-09-19 DIAGNOSIS — Z13820 Encounter for screening for osteoporosis: Secondary | ICD-10-CM

## 2013-09-19 DIAGNOSIS — Z23 Encounter for immunization: Secondary | ICD-10-CM | POA: Diagnosis not present

## 2013-09-19 DIAGNOSIS — I4891 Unspecified atrial fibrillation: Secondary | ICD-10-CM | POA: Diagnosis not present

## 2013-09-19 DIAGNOSIS — Z0000 Encounter for general adult medical examination without abnormal findings: Secondary | ICD-10-CM | POA: Diagnosis not present

## 2013-09-19 LAB — POCT INR: INR: 3.1

## 2013-09-19 MED ORDER — AMLODIPINE BESYLATE 2.5 MG PO TABS: 2.5000 mg | ORAL_TABLET | Freq: Every day | ORAL | Status: DC

## 2013-09-19 NOTE — Patient Instructions (Signed)
Anticoagulation Dose Instructions as of 09/19/2013     Dorene Grebe Tue Wed Thu Fri Sat   New Dose 3.75 mg 7.5 mg 3.75 mg 3.75 mg 3.75 mg 3.75 mg 3.75 mg    Description       Hold today, then continue 3.33m daily except 7.5 mg on mondays.       INR was 3.1 today   Health Maintenance Summary    COLONOSCOPY Postponed 02/12/2014 Postponed until 02/12/2014. Patient Declined    ZOSTAVAX Postponed 02/12/2014 Postponed until 02/12/2014. Checking on price    TETANUS/TDAP Postponed 02/12/2014 Postponed until 02/12/2014. Patient Declined    INFLUENZA VACCINE Next due  04/2014 Received today    MAMMOGRAM Next Due 10/31/2013  Appt for Spring sent today   glaucoma screening - needed DEXA - last 10/09/2009 - due now - referral made for check in Spring   Preventive Care for Adults, Female A healthy lifestyle and preventive care can promote health and wellness. Preventive health guidelines for women include the following key practices.  A routine yearly physical is a good way to check with your caregiver about your health and preventive screening. It is a chance to share any concerns and updates on your health, and to receive a thorough exam.  Visit your dentist for a routine exam and preventive care every 6 months. Brush your teeth twice a day and floss once a day. Good oral hygiene prevents tooth decay and gum disease.  The frequency of eye exams is based on your age, health, family medical history, use of contact lenses, and other factors. Follow your caregiver's recommendations for frequency of eye exams.  Eat a healthy diet. Foods like vegetables, fruits, whole grains, low-fat dairy products, and lean protein foods contain the nutrients you need without too many calories. Decrease your intake of foods high in solid fats, added sugars, and salt. Eat the right amount of calories for you.Get information about a proper diet from your caregiver, if necessary.  Regular physical exercise is one of the most important  things you can do for your health. Most adults should get at least 150 minutes of moderate-intensity exercise (any activity that increases your heart rate and causes you to sweat) each week. In addition, most adults need muscle-strengthening exercises on 2 or more days a week.  Maintain a healthy weight. The body mass index (BMI) is a screening tool to identify possible weight problems. It provides an estimate of body fat based on height and weight. Your caregiver can help determine your BMI, and can help you achieve or maintain a healthy weight.For adults 20 years and older:  A BMI below 18.5 is considered underweight.  A BMI of 18.5 to 24.9 is normal.  A BMI of 25 to 29.9 is considered overweight.  A BMI of 30 and above is considered obese.  Maintain normal blood lipids and cholesterol levels by exercising and minimizing your intake of saturated fat. Eat a balanced diet with plenty of fruit and vegetables. Blood tests for lipids and cholesterol should begin at age 656and be repeated every 5 years. If your lipid or cholesterol levels are high, you are over 50, or you are at high risk for heart disease, you may need your cholesterol levels checked more frequently.Ongoing high lipid and cholesterol levels should be treated with medicines if diet and exercise are not effective.  If you smoke, find out from your caregiver how to quit. If you do not use tobacco, do not start.  Lung  cancer screening is recommended for adults aged 20 80 years who are at high risk for developing lung cancer because of a history of smoking. Yearly low-dose computed tomography (CT) is recommended for people who have at least a 30-pack-year history of smoking and are a current smoker or have quit within the past 15 years. A pack year of smoking is smoking an average of 1 pack of cigarettes a day for 1 year (for example: 1 pack a day for 30 years or 2 packs a day for 15 years). Yearly screening should continue until the  smoker has stopped smoking for at least 15 years. Yearly screening should also be stopped for people who develop a health problem that would prevent them from having lung cancer treatment.  Avoid use of street drugs. Do not share needles with anyone. Ask for help if you need support or instructions about stopping the use of drugs.  High blood pressure causes heart disease and increases the risk of stroke. Your blood pressure should be checked at least every 1 to 2 years. Ongoing high blood pressure should be treated with medicines if weight loss and exercise are not effective.  If you are 11 to 78 years old, ask your caregiver if you should take aspirin to prevent strokes.  Diabetes screening involves taking a blood sample to check your fasting blood sugar level. This should be done once every 3 years, after age 29, if you are within normal weight and without risk factors for diabetes. Testing should be considered at a younger age or be carried out more frequently if you are overweight and have at least 1 risk factor for diabetes.  Breast cancer screening is essential preventive care for women. You should practice "breast self-awareness." This means understanding the normal appearance and feel of your breasts and may include breast self-examination. Any changes detected, no matter how small, should be reported to a caregiver. Women in their 16s and 30s should have a clinical breast exam (CBE) by a caregiver as part of a regular health exam every 1 to 3 years. After age 35, women should have a CBE every year. Starting at age 50, women should consider having a mammography (breast X-ray test) every year. Women who have a family history of breast cancer should talk to their caregiver about genetic screening. Women at a high risk of breast cancer should talk to their caregivers about having magnetic resonance imaging (MRI) and a mammography every year.  The Pap test is a screening test for cervical cancer. A  Pap test can show cell changes on the cervix that might become cervical cancer if left untreated. A Pap test is a procedure in which cells are obtained and examined from the lower end of the uterus (cervix).  Women should have a Pap test starting at age 21.  Between ages 27 and 65, Pap tests should be repeated every 2 years.  Beginning at age 33, you should have a Pap test every 3 years as long as the past 3 Pap tests have been normal.  Some women have medical problems that increase the chance of getting cervical cancer. Talk to your caregiver about these problems. It is especially important to talk to your caregiver if a new problem develops soon after your last Pap test. In these cases, your caregiver may recommend more frequent screening and Pap tests.  The above recommendations are the same for women who have or have not gotten the vaccine for human papillomavirus (HPV).  If you had a hysterectomy for a problem that was not cancer or a condition that could lead to cancer, then you no longer need Pap tests. Even if you no longer need a Pap test, a regular exam is a good idea to make sure no other problems are starting.  If you are between ages 60 and 91, and you have had normal Pap tests going back 10 years, you no longer need Pap tests. Even if you no longer need a Pap test, a regular exam is a good idea to make sure no other problems are starting.  If you have had past treatment for cervical cancer or a condition that could lead to cancer, you need Pap tests and screening for cancer for at least 20 years after your treatment.  If Pap tests have been discontinued, risk factors (such as a new sexual partner) need to be reassessed to determine if screening should be resumed.  The HPV test is an additional test that may be used for cervical cancer screening. The HPV test looks for the virus that can cause the cell changes on the cervix. The cells collected during the Pap test can be tested for  HPV. The HPV test could be used to screen women aged 67 years and older, and should be used in women of any age who have unclear Pap test results. After the age of 19, women should have HPV testing at the same frequency as a Pap test.  Colorectal cancer can be detected and often prevented. Most routine colorectal cancer screening begins at the age of 8 and continues through age 14. However, your caregiver may recommend screening at an earlier age if you have risk factors for colon cancer. On a yearly basis, your caregiver may provide home test kits to check for hidden blood in the stool. Use of a small camera at the end of a tube, to directly examine the colon (sigmoidoscopy or colonoscopy), can detect the earliest forms of colorectal cancer. Talk to your caregiver about this at age 32, when routine screening begins. Direct examination of the colon should be repeated every 5 to 10 years through age 58, unless early forms of pre-cancerous polyps or small growths are found.  Hepatitis C blood testing is recommended for all people born from [birth year] through 1965 and any individual with known risks for hepatitis C.  Practice safe sex. Use condoms and avoid high-risk sexual practices to reduce the spread of sexually transmitted infections (STIs). STIs include gonorrhea, chlamydia, syphilis, trichomonas, herpes, HPV, and human immunodeficiency virus (HIV). Herpes, HIV, and HPV are viral illnesses that have no cure. They can result in disability, cancer, and death. Sexually active women aged 97 and younger should be checked for chlamydia. Older women with new or multiple partners should also be tested for chlamydia. Testing for other STIs is recommended if you are sexually active and at increased risk.  Osteoporosis is a disease in which the bones lose minerals and strength with aging. This can result in serious bone fractures. The risk of osteoporosis can be identified using a bone density scan. Women ages 11 and  over and women at risk for fractures or osteoporosis should discuss screening with their caregivers. Ask your caregiver whether you should take a calcium supplement or vitamin D to reduce the rate of osteoporosis.  Menopause can be associated with physical symptoms and risks. Hormone replacement therapy is available to decrease symptoms and risks. You should talk to your caregiver about whether hormone  replacement therapy is right for you.  Use sunscreen. Apply sunscreen liberally and repeatedly throughout the day. You should seek shade when your shadow is shorter than you. Protect yourself by wearing long sleeves, pants, a wide-brimmed hat, and sunglasses year round, whenever you are outdoors.  Once a month, do a whole body skin exam, using a mirror to look at the skin on your back. Notify your caregiver of new moles, moles that have irregular borders, moles that are larger than a pencil eraser, or moles that have changed in shape or color.  Stay current with required immunizations.  Influenza vaccine. All adults should be immunized every year.  Tetanus, diphtheria, and acellular pertussis (Td, Tdap) vaccine. Pregnant women should receive 1 dose of Tdap vaccine during each pregnancy. The dose should be obtained regardless of the length of time since the last dose. Immunization is preferred during the 27th to 36th week of gestation. An adult who has not previously received Tdap or who does not know her vaccine status should receive 1 dose of Tdap. This initial dose should be followed by tetanus and diphtheria toxoids (Td) booster doses every 10 years. Adults with an unknown or incomplete history of completing a 3-dose immunization series with Td-containing vaccines should begin or complete a primary immunization series including a Tdap dose. Adults should receive a Td booster every 10 years.  Zoster vaccine. One dose is recommended for adults aged 63 years or older unless certain conditions are  present.  Measles, mumps, and rubella (MMR) vaccine. Adults born before [birth year] generally are considered immune to measles and mumps. Adults born in [birth year] or later should have 1 or more doses of MMR vaccine unless there is a contraindication to the vaccine or there is laboratory evidence of immunity to each of the three diseases. A routine second dose of MMR vaccine should be obtained at least 28 days after the first dose for students attending postsecondary schools, health care workers, or international travelers. People who received inactivated measles vaccine or an unknown type of measles vaccine during 1963 1967 should receive 2 doses of MMR vaccine. People who received inactivated mumps vaccine or an unknown type of mumps vaccine before 1979 and are at high risk for mumps infection should consider immunization with 2 doses of MMR vaccine. For females of childbearing age, rubella immunity should be determined. If there is no evidence of immunity, females who are not pregnant should be vaccinated. If there is no evidence of immunity, females who are pregnant should delay immunization until after pregnancy. Unvaccinated health care workers born before [birth year] who lack laboratory evidence of measles, mumps, or rubella immunity or laboratory confirmation of disease should consider measles and mumps immunization with 2 doses of MMR vaccine or rubella immunization with 1 dose of MMR vaccine.

## 2013-09-19 NOTE — Progress Notes (Addendum)
Subjective:    Melissa Knight is a 80 y.o. female who presents for Medicare Initial preventive examination.  Preventive Screening-Counseling & Management  Tobacco History  Smoking status  . Former Smoker -- 1.00 packs/day  . Types: Cigarettes  . Quit date: 09/30/1978  Smokeless tobacco  . Never Used      Current Problems (verified) Patient Active Problem List   Diagnosis Date Noted  . Hyperlipidemia 02/05/2013  . A-fib 12/05/2012  . Atrial fibrillation 07/02/2012  . CVA (cerebral infarction) 06/30/2012  . Seizure disorder, generalized convulsive, intractable 06/30/2012  . Acute respiratory failure 06/30/2012  . HTN (hypertension), malignant 06/30/2012    Medications Prior to Visit Current Outpatient Prescriptions on File Prior to Visit  Medication Sig Dispense Refill  . atorvastatin (LIPITOR) 10 MG tablet Take 1 tablet (10 mg total) by mouth daily at 6 PM.  90 tablet  1  . betamethasone dipropionate (DIPROLENE) 0.05 % cream       . furosemide (LASIX) 20 MG tablet Take 20 mg by mouth daily.       Marland Kitchen levETIRAcetam (KEPPRA) 500 MG tablet TAKE 1 TABLET (500 MG TOTAL) BY MOUTH 2 (TWO) TIMES DAILY.  60 tablet  2  . levothyroxine (SYNTHROID, LEVOTHROID) 88 MCG tablet TAKE 1 TABLET EVERY DAY  90 tablet  1  . lisinopril (PRINIVIL,ZESTRIL) 20 MG tablet Take as directed  180 tablet  3  . metoprolol (LOPRESSOR) 50 MG tablet TAKE 2 TABLETS IN AM AND 1 AT BEDTIME  270 tablet  0  . niacin (NIASPAN) 500 MG CR tablet TAKE 1 TABLET DAILY AT BEDTIME  90 tablet  0  . warfarin (COUMADIN) 7.5 MG tablet Take 1 tablet (7.5 mg total) by mouth one time only at 6 PM.  90 tablet  2   No current facility-administered medications on file prior to visit.    Current Medications (verified) Current Outpatient Prescriptions  Medication Sig Dispense Refill  . atorvastatin (LIPITOR) 10 MG tablet Take 1 tablet (10 mg total) by mouth daily at 6 PM.  90 tablet  1  . betamethasone dipropionate (DIPROLENE) 0.05  % cream       . furosemide (LASIX) 20 MG tablet Take 20 mg by mouth daily.       Marland Kitchen levETIRAcetam (KEPPRA) 500 MG tablet TAKE 1 TABLET (500 MG TOTAL) BY MOUTH 2 (TWO) TIMES DAILY.  60 tablet  2  . levothyroxine (SYNTHROID, LEVOTHROID) 88 MCG tablet TAKE 1 TABLET EVERY DAY  90 tablet  1  . lisinopril (PRINIVIL,ZESTRIL) 20 MG tablet Take as directed  180 tablet  3  . metoprolol (LOPRESSOR) 50 MG tablet TAKE 2 TABLETS IN AM AND 1 AT BEDTIME  270 tablet  0  . niacin (NIASPAN) 500 MG CR tablet TAKE 1 TABLET DAILY AT BEDTIME  90 tablet  0  . warfarin (COUMADIN) 7.5 MG tablet Take 1 tablet (7.5 mg total) by mouth one time only at 6 PM.  90 tablet  2  . amLODipine (NORVASC) 2.5 MG tablet Take 1 tablet (2.5 mg total) by mouth daily.  90 tablet  1   No current facility-administered medications for this visit.     Allergies (verified) Sulfa antibiotics   PAST HISTORY  Family History Family History  Problem Relation Age of Onset  . Atrial fibrillation Mother     pacemaker  . Hypertension Mother   . Heart disease Sister     MI in 72s  . Stroke Sister   . Heart disease Father   .  Heart disease Sister   . Heart disease Sister   . Hypertension Sister   . Anxiety disorder Sister   . Heart disease Brother   . Cancer Brother     pancreatic    Social History History  Substance Use Topics  . Smoking status: Former Smoker -- 1.00 packs/day    Types: Cigarettes    Quit date: 09/30/1978  . Smokeless tobacco: Never Used  . Alcohol Use: 0.6 oz/week    1 Glasses of wine per week     Are there smokers in your home (other than you)? No  Risk Factors Current exercise habits: The patient does not participate in regular exercise at present.    Cardiac risk factors: advanced age (older than 27 for men, 72 for women), dyslipidemia, family history of premature cardiovascular disease, hypertension and obesity (BMI >= 30 kg/m2).  Depression Screen (Note: if answer to either of the following is  "Yes", a more complete depression screening is indicated)   Over the past 2 weeks, have you felt down, depressed or hopeless? No  Over the past 2 weeks, have you felt little interest or pleasure in doing things? No  Have you lost interest or pleasure in daily life? No  Do you often feel hopeless? No  Do you cry easily over simple problems? Yes - over housework  Activities of Daily Living In your present state of health, do you have any difficulty performing the following activities?:  Driving? No Managing money?  No Feeding yourself? No Getting from bed to chair? No Climbing a flight of stairs? No Preparing food and eating?: No Bathing or showering? No Getting dressed: No Getting to the toilet? No Using the toilet:No Moving around from place to place: No In the past year have you fallen or had a near fall?: yes   Are you sexually active?  No  Do you have more than one partner?  No  Hearing Difficulties: No Do you often ask people to speak up or repeat themselves? No Do you experience ringing or noises in your ears? No Do you have difficulty understanding soft or whispered voices? No   Do you feel that you have a problem with memory? No  Do you often misplace items? No  Do you feel safe at home?  Yes  Cognitive Testing  Alert? Yes  Normal Appearance?Yes  Oriented to person? Yes  Place? Yes   Time? Yes  Recall of three objects?  Yes  Can perform simple calculations? Yes  Displays appropriate judgment?Yes  Can read the correct time from a watch face?Yes   Advanced Directives have been discussed with the patient? Yes  List the Names of Other Physician/Practitioners you currently use: 1.  Dr Burt Knack and Richardson Dopp at [facility] 2.  [place] - optomologist 3.  Asencion Noble - pulmonologist   Indicate any recent Medical Services you may have received from other than [facility] providers in the past year (date may be approximate). N/A  Immunization History   Administered Date(s) Administered  . Influenza Split 07/03/2012  . Influenza,inj,Quad PF,36+ Mos 09/19/2013  . Pneumococcal Polysaccharide-23 12/28/2010    Screening Tests Health Maintenance  Topic Date Due  . Colonoscopy  02/12/2014  . Zostavax  02/12/2014  . Tetanus/tdap  02/12/2014  . Influenza Vaccine  08/14/2014  . Mammogram  10/31/2013  . Pneumococcal Polysaccharide Vaccine Age 16 And Over  Completed    All answers were reviewed with the patient and necessary referrals were made:  [facility] Pharmacist   09/19/2013   History reviewed: allergies, current medications, past family history, past medical history, past social history, past surgical history and problem list   Objective:      Body mass index is 37.92 kg/(m^2). BP 132/84  Pulse 80  Ht 5\' 3"  (1.6 m)  Wt 214 lb (97.07 kg)  BMI 37.92 kg/m2    Assessment:    Annual Wellness Visit - needs influenza vaccine, mammogram, Dexa and colonoscopy     Plan:     During the course of the visit the patient was educated and counseled about appropriate screening and preventive services including:    Pneumococcal vaccine   Influenza vaccine  Hepatitis B vaccine  Td vaccine  Screening mammography  Screening Pap smear and pelvic exam   Bone densitometry screening  Colorectal cancer screening  Glaucoma screening  Influenza vaccine given today. Referrals made for mammogram and DEXA Patient refused colonoscopy. Also checked on price of zostavax - due to cost pt refused today but will recheck later this year.   Patient Instructions (the written plan) was given to the patient.  Medicare Attestation I have personally reviewed: The patient's medical and social history Their use of alcohol, tobacco or illicit drugs Their current medications and supplements The patient's functional ability including ADLs,fall risks, home safety risks, cognitive, and hearing and visual impairment Diet and physical  activities Evidence for depression or mood disorders  The patient's weight, height, BMI, and BP have been recorded in the chart.  I have made referrals, counseling, and provided education to the patient based on review of the above and I have provided the patient with a written personalized care plan for preventive services.     [facility] Pharmacist   09/19/2013      also discussed with patient the need for eye exam and glucoma screening. She stated that she has been meaning to do this and will make appt soon.

## 2013-10-20 ENCOUNTER — Other Ambulatory Visit: Payer: Self-pay | Admitting: Nurse Practitioner

## 2013-10-24 ENCOUNTER — Ambulatory Visit (INDEPENDENT_AMBULATORY_CARE_PROVIDER_SITE_OTHER): Payer: Medicare Other | Admitting: Pharmacist

## 2013-10-24 DIAGNOSIS — I4891 Unspecified atrial fibrillation: Secondary | ICD-10-CM | POA: Diagnosis not present

## 2013-10-24 LAB — POCT INR: INR: 2.7

## 2013-10-24 NOTE — Patient Instructions (Signed)
Anticoagulation Dose Instructions as of 10/24/2013     Dorene Grebe Tue Wed Thu Fri Sat   New Dose 3.75 mg 7.5 mg 3.75 mg 3.75 mg 3.75 mg 3.75 mg 3.75 mg    Description       Continue warfarin 3.75mg  daily except 7.5 mg on mondays.        INR was 2.7 today

## 2013-10-25 ENCOUNTER — Other Ambulatory Visit: Payer: Self-pay | Admitting: Nurse Practitioner

## 2013-10-28 NOTE — Telephone Encounter (Signed)
Last seen 08/14/13  MMM 

## 2013-11-06 ENCOUNTER — Other Ambulatory Visit: Payer: Self-pay | Admitting: Nurse Practitioner

## 2013-11-12 ENCOUNTER — Encounter: Payer: Self-pay | Admitting: Nurse Practitioner

## 2013-11-12 ENCOUNTER — Ambulatory Visit (INDEPENDENT_AMBULATORY_CARE_PROVIDER_SITE_OTHER): Payer: Medicare Other | Admitting: Nurse Practitioner

## 2013-11-12 VITALS — BP 142/80 | HR 61 | Temp 97.3°F | Wt 213.0 lb

## 2013-11-12 DIAGNOSIS — Z13820 Encounter for screening for osteoporosis: Secondary | ICD-10-CM

## 2013-11-12 DIAGNOSIS — I4891 Unspecified atrial fibrillation: Secondary | ICD-10-CM

## 2013-11-12 DIAGNOSIS — I10 Essential (primary) hypertension: Secondary | ICD-10-CM

## 2013-11-12 DIAGNOSIS — G40319 Generalized idiopathic epilepsy and epileptic syndromes, intractable, without status epilepticus: Secondary | ICD-10-CM | POA: Diagnosis not present

## 2013-11-12 DIAGNOSIS — E785 Hyperlipidemia, unspecified: Secondary | ICD-10-CM

## 2013-11-12 NOTE — Progress Notes (Signed)
Subjective:    Patient ID: Melissa Knight, female    DOB: [birth year], 80 y.o.   MRN: [id number]  Patient here today for follow up - no complaints today other than a sore place right side of scalp- only bother she if she picks at it.  Hyperlipidemia This is a chronic problem. The current episode started more than 1 year ago. The problem is controlled. Recent lipid tests were reviewed and are normal. Exacerbating diseases include hypothyroidism and obesity. There are no known factors aggravating her hyperlipidemia. Pertinent negatives include no chest pain, focal sensory loss, focal weakness, leg pain, myalgias or shortness of breath. Current antihyperlipidemic treatment includes statins. The current treatment provides significant improvement of lipids. Compliance problems include adherence to diet and adherence to exercise.  Risk factors for coronary artery disease include hypertension, obesity and post-menopausal.  Hypertension This is a chronic problem. The current episode started more than 1 year ago. The problem is unchanged. The problem is controlled. Associated symptoms include peripheral edema. Pertinent negatives include no blurred vision, chest pain, headaches, orthopnea, palpitations or shortness of breath. Agents associated with hypertension include thyroid hormones. Risk factors for coronary artery disease include obesity, post-menopausal state and sedentary lifestyle. Past treatments include calcium channel blockers, diuretics, ACE inhibitors and beta blockers. The current treatment provides significant improvement. Compliance problems include exercise and diet.  Hypertensive end-organ damage includes CAD/MI, CVA (06/2012) and a thyroid problem.  Thyroid Problem Presents for follow-up visit. Patient reports no anxiety, cold intolerance, constipation, diarrhea, dry skin, heat intolerance, hoarse voice, palpitations or visual change. The symptoms have been stable. Her past medical history is  significant for hyperlipidemia.  Seizures  This is a new problem. The current episode started 12 to 24 hours ago. The problem has been resolved. There was 1 seizure. The most recent episode lasted 2 to 5 minutes. Pertinent negatives include no headaches, no chest pain and no diarrhea. Characteristics include eye blinking, rhythmic jerking, loss of consciousness and bit tongue. Characteristics do not include eye deviation, bowel incontinence or bladder incontinence. The episode was witnessed. There was no sensation of an aura present. The seizures did not continue in the ED.  atrial fib Coumadin level to be checked today   Review of Systems  HENT: Negative for hoarse voice.   Eyes: Negative for blurred vision.  Respiratory: Negative for shortness of breath.   Cardiovascular: Negative for chest pain, palpitations and orthopnea.  Gastrointestinal: Negative for diarrhea, constipation and bowel incontinence.  Endocrine: Negative for cold intolerance and heat intolerance.  Genitourinary: Negative for bladder incontinence.  Musculoskeletal: Negative for myalgias.  Neurological: Positive for seizures and loss of consciousness. Negative for focal weakness and headaches.  All other systems reviewed and are negative.       Objective:   Physical Exam  Constitutional: She is oriented to person, place, and time. She appears well-developed and well-nourished.  HENT:  Nose: Nose normal.  Mouth/Throat: Oropharynx is clear and moist.  Eyes: EOM are normal.  Neck: Trachea normal, normal range of motion and full passive range of motion without pain. Neck supple. No JVD present. Carotid bruit is not present. No thyromegaly present.  Cardiovascular: Normal rate, regular rhythm, normal heart sounds and intact distal pulses.  Exam reveals no gallop and no friction rub.   No murmur heard. Pulmonary/Chest: Effort normal and breath sounds normal.  Abdominal: Soft. Bowel sounds are normal. She exhibits no  distension and no mass. There is no tenderness.  Musculoskeletal: Normal range of motion. She  exhibits edema (1+ edema bil lower ext).  Lymphadenopathy:    She has no cervical adenopathy.  Neurological: She is alert and oriented to person, place, and time. She has normal reflexes.  Skin: Skin is warm and dry.  Psychiatric: She has a normal mood and affect. Her behavior is normal. Judgment and thought content normal.   BP 160/92  Pulse 61  Temp(Src) 97.3 F (36.3 C) (Oral)  Wt 213 lb (96.616 kg)        Assessment & Plan:   1. Hyperlipidemia   2. HTN (hypertension), malignant   3. Atrial fibrillation   4. Seizure disorder, generalized convulsive, intractable    Orders Placed This Encounter  Procedures  . CMP14+EGFR  . NMR, lipoprofile   dexa scan ordered mammo scheduled for the end of the month Labs pending Health maintenance reviewed Diet and exercise encouraged Continue all meds Follow up  In 3 months   Register, FNP

## 2013-11-12 NOTE — Patient Instructions (Signed)

## 2013-11-14 LAB — CMP14+EGFR
A/G RATIO: 2.1 (ref 1.1–2.5)
ALBUMIN: 4.1 g/dL (ref 3.5–4.8)
ALT: 17 IU/L (ref 0–32)
AST: 22 IU/L (ref 0–40)
Alkaline Phosphatase: 57 IU/L (ref 39–117)
BILIRUBIN TOTAL: 0.2 mg/dL (ref 0.0–1.2)
BUN/Creatinine Ratio: 17 (ref 11–26)
BUN: 13 mg/dL (ref 8–27)
CO2: 26 mmol/L (ref 18–29)
Calcium: 9.4 mg/dL (ref 8.7–10.3)
Chloride: 103 mmol/L (ref 97–108)
Creatinine, Ser: 0.77 mg/dL (ref 0.57–1.00)
GFR calc Af Amer: 85 mL/min/{1.73_m2} (ref >59)
GFR, EST NON AFRICAN AMERICAN: 74 mL/min/{1.73_m2} (ref >59)
GLUCOSE: 103 mg/dL — AB (ref 65–99)
Globulin, Total: 2 g/dL (ref 1.5–4.5)
Potassium: 4.9 mmol/L (ref 3.5–5.2)
Sodium: 144 mmol/L (ref 134–144)
TOTAL PROTEIN: 6.1 g/dL (ref 6.0–8.5)

## 2013-11-14 LAB — NMR, LIPOPROFILE
CHOLESTEROL: 190 mg/dL (ref <200)
HDL CHOLESTEROL BY NMR: 50 mg/dL (ref >=40)
HDL Particle Number: 34.1 umol/L (ref >=30.5)
LDL Particle Number: 999 nmol/L (ref <1000)
LDL SIZE: 22.1 nm (ref >20.5)
LDLC SERPL CALC-MCNC: 74 mg/dL (ref <100)
LP-IR Score: 49 — ABNORMAL HIGH (ref <=45)
SMALL LDL PARTICLE NUMBER: 138 nmol/L (ref <=527)
TRIGLYCERIDES BY NMR: 328 mg/dL — AB (ref <150)

## 2013-11-27 ENCOUNTER — Other Ambulatory Visit: Payer: Self-pay | Admitting: *Deleted

## 2013-11-27 MED ORDER — LEVETIRACETAM 500 MG PO TABS: ORAL_TABLET | ORAL | Status: DC

## 2013-12-02 DIAGNOSIS — Z1231 Encounter for screening mammogram for malignant neoplasm of breast: Secondary | ICD-10-CM | POA: Diagnosis not present

## 2013-12-03 ENCOUNTER — Other Ambulatory Visit: Payer: Self-pay | Admitting: *Deleted

## 2013-12-03 MED ORDER — BETAMETHASONE DIPROPIONATE 0.05 % EX CREA: TOPICAL_CREAM | CUTANEOUS | Status: DC

## 2013-12-03 NOTE — Telephone Encounter (Signed)
Last ov- 11/12/13

## 2013-12-03 NOTE — Telephone Encounter (Signed)
Patient NTBS for follow up and lab work  

## 2013-12-05 ENCOUNTER — Ambulatory Visit (INDEPENDENT_AMBULATORY_CARE_PROVIDER_SITE_OTHER): Payer: Medicare Other | Admitting: Pharmacist

## 2013-12-05 DIAGNOSIS — I4891 Unspecified atrial fibrillation: Secondary | ICD-10-CM

## 2013-12-05 LAB — POCT INR: INR: 3.7

## 2013-12-05 NOTE — Patient Instructions (Signed)
Anticoagulation Dose Instructions as of 12/05/2013     Sun Mon Tue Wed Thu Fri Sat   New Dose 3.75 mg 3.75 mg 3.75 mg 3.75 mg 3.75 mg 3.75 mg 3.75 mg    Description       No warfarin today (Thursday, March 26th) then decrease 1/2 tablet every day.      INR was 3.7 today

## 2013-12-16 ENCOUNTER — Other Ambulatory Visit: Payer: Self-pay | Admitting: *Deleted

## 2013-12-16 MED ORDER — LEVOTHYROXINE SODIUM 88 MCG PO TABS: ORAL_TABLET | ORAL | Status: DC

## 2013-12-16 NOTE — Telephone Encounter (Signed)
Patient NTBS for follow up and lab work  

## 2013-12-16 NOTE — Telephone Encounter (Signed)
Last  Ov 3/15. Last thyroid 2/14. ntbs

## 2013-12-19 ENCOUNTER — Ambulatory Visit (INDEPENDENT_AMBULATORY_CARE_PROVIDER_SITE_OTHER): Payer: Medicare Other | Admitting: Pharmacist

## 2013-12-19 DIAGNOSIS — I4891 Unspecified atrial fibrillation: Secondary | ICD-10-CM | POA: Diagnosis not present

## 2013-12-19 LAB — POCT INR: INR: 2.5

## 2013-12-19 NOTE — Patient Instructions (Signed)
Anticoagulation Dose Instructions as of 12/19/2013     Melissa Knight Tue Wed Thu Fri Sat   New Dose 3.75 mg 3.75 mg 3.75 mg 3.75 mg 3.75 mg 3.75 mg 3.75 mg    Description       Continue warfarin 1/2 tablet every day.     INR was 2.5 today

## 2014-01-01 ENCOUNTER — Ambulatory Visit (INDEPENDENT_AMBULATORY_CARE_PROVIDER_SITE_OTHER): Payer: Medicare Other

## 2014-01-01 ENCOUNTER — Ambulatory Visit (INDEPENDENT_AMBULATORY_CARE_PROVIDER_SITE_OTHER): Payer: Medicare Other | Admitting: Pharmacist

## 2014-01-01 VITALS — Ht 64.0 in | Wt 212.0 lb

## 2014-01-01 DIAGNOSIS — E8881 Metabolic syndrome: Secondary | ICD-10-CM | POA: Insufficient documentation

## 2014-01-01 DIAGNOSIS — I4891 Unspecified atrial fibrillation: Secondary | ICD-10-CM

## 2014-01-01 DIAGNOSIS — E782 Mixed hyperlipidemia: Secondary | ICD-10-CM | POA: Diagnosis not present

## 2014-01-01 DIAGNOSIS — N951 Menopausal and female climacteric states: Secondary | ICD-10-CM

## 2014-01-01 DIAGNOSIS — R7309 Other abnormal glucose: Secondary | ICD-10-CM | POA: Diagnosis not present

## 2014-01-01 DIAGNOSIS — R7303 Prediabetes: Secondary | ICD-10-CM

## 2014-01-01 DIAGNOSIS — E669 Obesity, unspecified: Secondary | ICD-10-CM

## 2014-01-01 DIAGNOSIS — Z13820 Encounter for screening for osteoporosis: Secondary | ICD-10-CM | POA: Diagnosis not present

## 2014-01-01 LAB — POCT INR: INR: 2

## 2014-01-01 LAB — HM DEXA SCAN: HM Dexa Scan: NORMAL

## 2014-01-01 LAB — POCT GLYCOSYLATED HEMOGLOBIN (HGB A1C): HEMOGLOBIN A1C: 5.5

## 2014-01-01 NOTE — Patient Instructions (Signed)
Anticoagulation Dose Instructions as of 01/01/2014     Dorene Grebe Tue Wed Thu Fri Sat   New Dose 3.75 mg 3.75 mg 3.75 mg 3.75 mg 3.75 mg 3.75 mg 3.75 mg    Description       Continue warfarin 1/2 tablet every day.     INR was 2.0 today    Recommend weight bearing exercise - goal 150 minutes per week.  Fall Prevention and Home Safety Falls cause injuries and can affect all age groups. It is possible to use preventive measures to significantly decrease the likelihood of falls. There are many simple measures which can make your home safer and prevent falls. OUTDOORS  Repair cracks and edges of walkways and driveways.  Remove high doorway thresholds.  Trim shrubbery on the main path into your home.  Have good outside lighting.  Clear walkways of tools, rocks, debris, and clutter.  Check that handrails are not broken and are securely fastened. Both sides of steps should have handrails.  Have leaves, snow, and ice cleared regularly.  Use sand or salt on walkways during winter months.  In the garage, clean up grease or oil spills. BATHROOM  Install night lights.  Install grab bars by the toilet and in the tub and shower.  Use non-skid mats or decals in the tub or shower.  Place a plastic non-slip stool in the shower to sit on, if needed.  Keep floors dry and clean up all water on the floor immediately.  Remove soap buildup in the tub or shower on a regular basis.  Secure bath mats with non-slip, double-sided rug tape.  Remove throw rugs and tripping hazards from the floors. BEDROOMS  Install night lights.  Make sure a bedside light is easy to reach.  Do not use oversized bedding.  Keep a telephone by your bedside.  Have a firm chair with side arms to use for getting dressed.  Remove throw rugs and tripping hazards from the floor. KITCHEN  Keep handles on pots and pans turned toward the center of the stove. Use back burners when possible.  Clean up spills  quickly and allow time for drying.  Avoid walking on wet floors.  Avoid hot utensils and knives.  Position shelves so they are not too high or low.  Place commonly used objects within easy reach.  If necessary, use a sturdy step stool with a grab bar when reaching.  Keep electrical cables out of the way.  Do not use floor polish or wax that makes floors slippery. If you must use wax, use non-skid floor wax.  Remove throw rugs and tripping hazards from the floor. STAIRWAYS  Never leave objects on stairs.  Place handrails on both sides of stairways and use them. Fix any loose handrails. Make sure handrails on both sides of the stairways are as long as the stairs.  Check carpeting to make sure it is firmly attached along stairs. Make repairs to worn or loose carpet promptly.  Avoid placing throw rugs at the top or bottom of stairways, or properly secure the rug with carpet tape to prevent slippage. Get rid of throw rugs, if possible.  Have an electrician put in a light switch at the top and bottom of the stairs. OTHER FALL PREVENTION TIPS  Wear low-heel or rubber-soled shoes that are supportive and fit well. Wear closed toe shoes.  When using a stepladder, make sure it is fully opened and both spreaders are firmly locked. Do not climb a closed  stepladder.  Add color or contrast paint or tape to grab bars and handrails in your home. Place contrasting color strips on first and last steps.  Learn and use mobility aids as needed. Install an electrical emergency response system.  Turn on lights to avoid dark areas. Replace light bulbs that burn out immediately. Get light switches that glow.  Arrange furniture to create clear pathways. Keep furniture in the same place.  Firmly attach carpet with non-skid or double-sided tape.  Eliminate uneven floor surfaces.  Select a carpet pattern that does not visually hide the edge of steps.  Be aware of all pets. OTHER HOME SAFETY  TIPS  Set the water temperature for 120 F (48.8 C).  Keep emergency numbers on or near the telephone.  Keep smoke detectors on every level of the home and near sleeping areas. Document Released: 08/19/2002 Document Revised: 02/28/2012 Document Reviewed: 11/18/2011 [facility] Patient Information 2014 [place].

## 2014-01-01 NOTE — Progress Notes (Signed)
Patient ID: [name], female   DOB: 1933-06-27, 80 y.o.   MRN: [id number] [facility] Current Height: Height: 5\' 4"  (162.6 cm)      Max Lifetime Height:  5' 4.25" Current Weight: Weight: 212 lb (96.163 kg)       Ethnicity:Caucasian    HPI: Does pt already have a diagnosis of:  Osteopenia?  No Osteoporosis?  No  Back Pain?  Yes       Kyphosis?  No Prior fracture?  Yes - collarbone (child) and humerus (falll on ice 40 years ago) Med(s) for Osteoporosis/Osteopenia:  NONE Med(s) previously tried for Osteoporosis/Osteopenia:  NONE                                                             PMH: Age at menopause:  49 - 45 YO Hysterectomy?  NO Oophorectomy?  No HRT? No Steroid Use?  No Thyroid med?  Yes History of cancer?  No History of digestive disorders (ie Crohn's)?  No Current or previous eating disorders?  No Last Vitamin D Result:  44 (2012) Last GFR Result:  74 (11/12/2013)   FH/SH: Family history of osteoporosis?  No Parent with history of hip fracture?  No Family history of breast cancer?  No Exercise?  No Smoking?  No Alcohol?  No    Calcium Assessment Calcium Intake  # of servings/day  Calcium mg  Milk (8 oz) 0.5  x  300  = 150mg   Yogurt (4 oz) 1 x  200 = 200mg   Cheese (1 oz) 1 x  200 = 200mg   Other Calcium sources   250mg   Ca supplement 0 = 0   Estimated calcium intake per day 800mg     DEXA Results Date of Test T-Score for AP Spine L1-L4 T-Score for Total Left Hip T-Score for Total Right Hip  01/01/2014 1.0 0.7 1.1  04/08/2009 1.4 0.9 1.2             INR = 2.0 today Reviewd last labs with patient at her request - she received MyChart results but did not understand results.  Of note BG = 103 and Tg were 328 (non-fasting per patient)  Assessment: Normal BMD Therapeutic INR Elevated Triglycerides Pre Diabetes   Recommendations: 1. Discussed results of BMD and fall risk  2.  recommend calcium 1200mg  daily through supplementation or  diet.  3.  recommend weight bearing exercise - goal is 150 minutes per week.  Discussed [employer]ne[employer] program and possibly joining program with physical therapist - [place] 4.  Counseled and educated about fall risk and prevention. 5.  Discussed labs from March 2015 and that BG was slightly elevated and Tg were elevated.  Goals for Bg is less than 100 and Tg less than 150. 6.  Discussed limiting sugar and high CHO foods to help decreased BG and Tg.  7.  Also discussed weight loss that would also help decrease development of DM and help decrease Tg.  Patient is motivated to make lifestyle modifications, 8.   Anticoagulation Dose Instructions as of 01/01/2014     Dorene Grebe Tue Wed Thu Fri Sat   New Dose 3.75 mg 3.75 mg 3.75 mg 3.75 mg 3.75 mg 3.75 mg 3.75 mg    Description       Continue warfarin 1/2  tablet every day.    9.   Orders Placed This Encounter  Procedures  . HM DEXA SCAN    This external order was created through the Results Console.  Marland Kitchen POCT glycosylated hemoglobin (Hb A1C)   RTC in 1 month to recheck INR and assess progress with lifestyle modifications  Recheck DEXA:  2 years  Time spent counseling patient:  40 minutes  Cherre Robins, PharmD, CPP

## 2014-01-20 ENCOUNTER — Other Ambulatory Visit: Payer: Self-pay | Admitting: *Deleted

## 2014-01-20 MED ORDER — NIACIN ER (ANTIHYPERLIPIDEMIC) 500 MG PO TBCR: 500.0000 mg | EXTENDED_RELEASE_TABLET | Freq: Every day | ORAL | Status: DC

## 2014-01-20 MED ORDER — LEVOTHYROXINE SODIUM 88 MCG PO TABS: ORAL_TABLET | ORAL | Status: DC

## 2014-01-20 NOTE — Telephone Encounter (Signed)
Been here many times, but no TSH on file

## 2014-01-20 NOTE — Telephone Encounter (Signed)
Patient NTBS for follow up and lab work  

## 2014-01-21 ENCOUNTER — Other Ambulatory Visit: Payer: Self-pay | Admitting: Nurse Practitioner

## 2014-01-21 MED ORDER — LEVOTHYROXINE SODIUM 88 MCG PO TABS: ORAL_TABLET | ORAL | Status: DC

## 2014-02-04 ENCOUNTER — Other Ambulatory Visit: Payer: Self-pay

## 2014-02-04 MED ORDER — BETAMETHASONE DIPROPIONATE 0.05 % EX CREA: TOPICAL_CREAM | CUTANEOUS | Status: DC

## 2014-02-06 ENCOUNTER — Encounter: Payer: Self-pay | Admitting: Pharmacist

## 2014-02-06 ENCOUNTER — Ambulatory Visit (INDEPENDENT_AMBULATORY_CARE_PROVIDER_SITE_OTHER): Payer: Medicare Other | Admitting: Pharmacist

## 2014-02-06 VITALS — Ht 64.0 in | Wt 216.0 lb

## 2014-02-06 DIAGNOSIS — R7303 Prediabetes: Secondary | ICD-10-CM

## 2014-02-06 DIAGNOSIS — I4891 Unspecified atrial fibrillation: Secondary | ICD-10-CM | POA: Diagnosis not present

## 2014-02-06 DIAGNOSIS — R7309 Other abnormal glucose: Secondary | ICD-10-CM | POA: Diagnosis not present

## 2014-02-06 DIAGNOSIS — E669 Obesity, unspecified: Secondary | ICD-10-CM

## 2014-02-06 DIAGNOSIS — E8881 Metabolic syndrome: Secondary | ICD-10-CM | POA: Diagnosis not present

## 2014-02-06 LAB — POCT INR: INR: 2.3

## 2014-02-06 NOTE — Progress Notes (Signed)
HPI:  At last visit discussed weight and metabolic syndrome.  Patient was advised on reduced calorie and CHO diet.  She was also educated on exercise and benefits for weight and lipids.    Filed Weights   02/06/14 1347  Weight: 216 lb (97.977 kg)   Body mass index is 37.06 kg/(m^2).  Assessment: Obesity - patient has been educated about changed needed but has not made changes yet. Metabolic syndrome  Plan: 1.  Reviewed dietary recommendation again.  Patient is to start with 2 changes which is to decrease potato intake and increase non starchy vegetable and to start walking.  2.  Follow up in 4-6 weeks 3.  Short term weight goal = lose 2# before next appt.  Long term weight goal = 20#  Cherre Robins, PharmD, CPP

## 2014-02-06 NOTE — Patient Instructions (Signed)
Anticoagulation Dose Instructions as of 02/06/2014     Melissa Knight Tue Wed Thu Fri Sat   New Dose 3.75 mg 3.75 mg 3.75 mg 3.75 mg 3.75 mg 3.75 mg 3.75 mg    Description       Continue warfarin 1/2 tablet every day.      INR was 2.3 today

## 2014-02-07 ENCOUNTER — Other Ambulatory Visit: Payer: Self-pay

## 2014-02-07 MED ORDER — METOPROLOL TARTRATE 50 MG PO TABS: ORAL_TABLET | ORAL | Status: DC

## 2014-02-13 ENCOUNTER — Other Ambulatory Visit: Payer: Self-pay | Admitting: Nurse Practitioner

## 2014-02-14 NOTE — Telephone Encounter (Signed)
No more refills without being seen 

## 2014-02-14 NOTE — Telephone Encounter (Signed)
Patient aware they need to be seen before next refill

## 2014-02-14 NOTE — Telephone Encounter (Signed)
Notified at last refill NTBS. Was seen but only for protime. Please advise on refill

## 2014-02-25 ENCOUNTER — Other Ambulatory Visit: Payer: Self-pay | Admitting: Nurse Practitioner

## 2014-03-05 ENCOUNTER — Ambulatory Visit (INDEPENDENT_AMBULATORY_CARE_PROVIDER_SITE_OTHER): Payer: Medicare Other | Admitting: Nurse Practitioner

## 2014-03-05 ENCOUNTER — Encounter (INDEPENDENT_AMBULATORY_CARE_PROVIDER_SITE_OTHER): Payer: Self-pay

## 2014-03-05 ENCOUNTER — Encounter: Payer: Self-pay | Admitting: Nurse Practitioner

## 2014-03-05 VITALS — BP 138/84 | HR 75 | Temp 99.0°F | Ht 64.0 in | Wt 214.4 lb

## 2014-03-05 DIAGNOSIS — E039 Hypothyroidism, unspecified: Secondary | ICD-10-CM | POA: Diagnosis not present

## 2014-03-05 DIAGNOSIS — E785 Hyperlipidemia, unspecified: Secondary | ICD-10-CM | POA: Diagnosis not present

## 2014-03-05 DIAGNOSIS — I10 Essential (primary) hypertension: Secondary | ICD-10-CM

## 2014-03-05 DIAGNOSIS — E669 Obesity, unspecified: Secondary | ICD-10-CM

## 2014-03-05 DIAGNOSIS — R569 Unspecified convulsions: Secondary | ICD-10-CM

## 2014-03-05 DIAGNOSIS — I4891 Unspecified atrial fibrillation: Secondary | ICD-10-CM | POA: Diagnosis not present

## 2014-03-05 LAB — POCT INR: INR: 2.4

## 2014-03-05 MED ORDER — LEVETIRACETAM 500 MG PO TABS: ORAL_TABLET | ORAL | Status: DC

## 2014-03-05 MED ORDER — WARFARIN SODIUM 7.5 MG PO TABS: 7.5000 mg | ORAL_TABLET | Freq: Once | ORAL | Status: DC

## 2014-03-05 MED ORDER — METOPROLOL TARTRATE 50 MG PO TABS: ORAL_TABLET | ORAL | Status: DC

## 2014-03-05 MED ORDER — AMLODIPINE BESYLATE 2.5 MG PO TABS: 2.5000 mg | ORAL_TABLET | Freq: Every day | ORAL | Status: DC

## 2014-03-05 MED ORDER — ATORVASTATIN CALCIUM 10 MG PO TABS
10.0000 mg | ORAL_TABLET | Freq: Every day | ORAL | Status: DC
Start: 2014-03-05 — End: 2014-09-10

## 2014-03-05 MED ORDER — NIACIN ER (ANTIHYPERLIPIDEMIC) 500 MG PO TBCR: 500.0000 mg | EXTENDED_RELEASE_TABLET | Freq: Every day | ORAL | Status: DC

## 2014-03-05 MED ORDER — LEVOTHYROXINE SODIUM 88 MCG PO TABS: ORAL_TABLET | ORAL | Status: DC

## 2014-03-05 MED ORDER — LISINOPRIL 20 MG PO TABS: 20.0000 mg | ORAL_TABLET | Freq: Every day | ORAL | Status: DC

## 2014-03-05 NOTE — Patient Instructions (Signed)

## 2014-03-05 NOTE — Progress Notes (Signed)
Subjective:    Patient ID: Melissa Knight, female    DOB: [birth year], 80 y.o.   MRN: [id number]  Patient here today for follow up - no complaints today.  Hyperlipidemia This is a chronic problem. The current episode started more than 1 year ago. The problem is controlled. Recent lipid tests were reviewed and are normal. Exacerbating diseases include hypothyroidism and obesity. There are no known factors aggravating her hyperlipidemia. Pertinent negatives include no chest pain, focal sensory loss, focal weakness, leg pain, myalgias or shortness of breath. Current antihyperlipidemic treatment includes statins. The current treatment provides significant improvement of lipids. Compliance problems include adherence to diet and adherence to exercise.  Risk factors for coronary artery disease include hypertension, obesity and post-menopausal.  Hypertension This is a chronic problem. The current episode started more than 1 year ago. The problem is unchanged. The problem is controlled. Associated symptoms include peripheral edema. Pertinent negatives include no blurred vision, chest pain, headaches, orthopnea, palpitations or shortness of breath. Agents associated with hypertension include thyroid hormones. Risk factors for coronary artery disease include obesity, post-menopausal state and sedentary lifestyle. Past treatments include calcium channel blockers, diuretics, ACE inhibitors and beta blockers. The current treatment provides significant improvement. Compliance problems include exercise and diet.  Hypertensive end-organ damage includes CAD/MI, CVA (06/2012) and a thyroid problem.  Thyroid Problem Presents for follow-up visit. Patient reports no anxiety, cold intolerance, constipation, diarrhea, dry skin, heat intolerance, hoarse voice, palpitations or visual change. The symptoms have been stable. Her past medical history is significant for hyperlipidemia.  Seizures  This is a new problem. The current  episode started 12 to 24 hours ago. The problem has been resolved. There was 1 seizure. The most recent episode lasted 2 to 5 minutes. Pertinent negatives include no headaches, no chest pain and no diarrhea. Characteristics include eye blinking, rhythmic jerking, loss of consciousness and bit tongue. Characteristics do not include eye deviation, bowel incontinence or bladder incontinence. The episode was witnessed. There was no sensation of an aura present. The seizures did not continue in the ED.  atrial fib Coumadin level to be checked today   Review of Systems  HENT: Negative for hoarse voice.   Eyes: Negative for blurred vision.  Respiratory: Negative for shortness of breath.   Cardiovascular: Negative for chest pain, palpitations and orthopnea.  Gastrointestinal: Negative for diarrhea, constipation and bowel incontinence.  Endocrine: Negative for cold intolerance and heat intolerance.  Genitourinary: Negative for bladder incontinence.  Musculoskeletal: Negative for myalgias.  Neurological: Positive for seizures and loss of consciousness. Negative for focal weakness and headaches.  All other systems reviewed and are negative.      Objective:   Physical Exam  Constitutional: She is oriented to person, place, and time. She appears well-developed and well-nourished.  HENT:  Nose: Nose normal.  Mouth/Throat: Oropharynx is clear and moist.  Eyes: EOM are normal.  Neck: Trachea normal, normal range of motion and full passive range of motion without pain. Neck supple. No JVD present. Carotid bruit is not present. No thyromegaly present.  Cardiovascular: Normal rate, regular rhythm, normal heart sounds and intact distal pulses.  Exam reveals no gallop and no friction rub.   No murmur heard. Pulmonary/Chest: Effort normal and breath sounds normal.  Abdominal: Soft. Bowel sounds are normal. She exhibits no distension and no mass. There is no tenderness.  Musculoskeletal: Normal range of  motion. She exhibits edema (1+ edema bil lower ext).  Lymphadenopathy:    She has no cervical adenopathy.  Neurological: She is alert and oriented to person, place, and time. She has normal reflexes.  Skin: Skin is warm and dry.  Psychiatric: She has a normal mood and affect. Her behavior is normal. Judgment and thought content normal.   BP 138/84  Pulse 75  Temp(Src) 99 F (37.2 C) (Oral)  Ht _0  (1.626 m)  Wt 214 lb 6.4 oz (97.251 kg)  BMI 36.78 kg/m2        Assessment & Plan:    1. A-fib   2. Obesity (BMI 30-39.9)   3. Hyperlipidemia   4. HTN (hypertension), malignant   5. Atrial fibrillation, unspecified   6. Hypothyroidism, unspecified hypothyroidism type   7. Convulsions, unspecified convulsion type    Orders Placed This Encounter  Procedures  . CMP14+EGFR  . NMR, lipoprofile  . Thyroid Panel With TSH   Meds ordered this encounter  Medications  . amLODipine (NORVASC) 2.5 MG tablet    Sig: Take 1 tablet (2.5 mg total) by mouth daily.    Dispense:  90 tablet    Refill:  1    Order Specific Question:  Supervising Provider    Answer:  Chipper Herb [[id number]]  . atorvastatin (LIPITOR) 10 MG tablet    Sig: Take 1 tablet (10 mg total) by mouth daily at 6 PM.    Dispense:  90 tablet    Refill:  1    Order Specific Question:  Supervising Provider    Answer:  Chipper Herb [[id number]]  . levETIRAcetam (KEPPRA) 500 MG tablet    Sig: TAKE 1 TABLET (500 MG TOTAL) BY MOUTH 2 (TWO) TIMES DAILY.    Dispense:  180 tablet    Refill:  1    Order Specific Question:  Supervising Provider    Answer:  Chipper Herb [[id number]]  . levothyroxine (SYNTHROID, LEVOTHROID) 88 MCG tablet    Sig: TAKE 1 TABLET EVERY DAY    Dispense:  90 tablet    Refill:  1    Order Specific Question:  Supervising Provider    Answer:  Chipper Herb [[id number]]  . lisinopril (PRINIVIL,ZESTRIL) 20 MG tablet    Sig: Take 1 tablet (20 mg total) by mouth daily.    Dispense:  90 tablet    Refill:  1     Order Specific Question:  Supervising Provider    Answer:  Chipper Herb [[id number]]  . metoprolol (LOPRESSOR) 50 MG tablet    Sig: TAKE 2 TABLETS IN AM AND 1 AT BEDTIME    Dispense:  270 tablet    Refill:  1    Order Specific Question:  Supervising Provider    Answer:  Chipper Herb [[id number]]  . niacin (NIASPAN) 500 MG CR tablet    Sig: Take 1 tablet (500 mg total) by mouth at bedtime.    Dispense:  90 tablet    Refill:  1    Order Specific Question:  Supervising Provider    Answer:  Chipper Herb [[id number]]  . warfarin (COUMADIN) 7.5 MG tablet    Sig: Take 1 tablet (7.5 mg total) by mouth one time only at 6 PM.    Dispense:  90 tablet    Refill:  3    Order Specific Question:  Supervising Provider    Answer:  Chipper Herb [[id number]]    Labs pending Health maintenance reviewed Diet and exercise encouraged Continue all meds Follow up  In 3 months   [place], FNP

## 2014-03-06 LAB — CMP14+EGFR
ALK PHOS: 51 IU/L (ref 39–117)
ALT: 15 IU/L (ref 0–32)
AST: 20 IU/L (ref 0–40)
Albumin/Globulin Ratio: 2 (ref 1.1–2.5)
Albumin: 3.9 g/dL (ref 3.5–4.8)
BUN/Creatinine Ratio: 18 (ref 11–26)
BUN: 12 mg/dL (ref 8–27)
CO2: 22 mmol/L (ref 18–29)
CREATININE: 0.68 mg/dL (ref 0.57–1.00)
Calcium: 9 mg/dL (ref 8.7–10.3)
Chloride: 103 mmol/L (ref 97–108)
GFR calc Af Amer: 96 mL/min/{1.73_m2} (ref >59)
GFR calc non Af Amer: 83 mL/min/{1.73_m2} (ref >59)
GLOBULIN, TOTAL: 2 g/dL (ref 1.5–4.5)
Glucose: 99 mg/dL (ref 65–99)
Potassium: 4 mmol/L (ref 3.5–5.2)
SODIUM: 140 mmol/L (ref 134–144)
Total Bilirubin: 0.5 mg/dL (ref 0.0–1.2)
Total Protein: 5.9 g/dL — ABNORMAL LOW (ref 6.0–8.5)

## 2014-03-06 LAB — NMR, LIPOPROFILE
Cholesterol: 166 mg/dL (ref 100–199)
HDL Cholesterol by NMR: 54 mg/dL (ref >39)
HDL Particle Number: 33 umol/L (ref >=30.5)
LDL PARTICLE NUMBER: 821 nmol/L (ref <1000)
LDL Size: 22 nm (ref >20.5)
LDLC SERPL CALC-MCNC: 86 mg/dL (ref 0–99)
LP-IR SCORE: 41 (ref <=45)
Small LDL Particle Number: 211 nmol/L (ref <=527)
Triglycerides by NMR: 132 mg/dL (ref 0–149)

## 2014-03-06 LAB — THYROID PANEL WITH TSH
Free Thyroxine Index: 3 (ref 1.2–4.9)
T3 UPTAKE RATIO: 29 % (ref 24–39)
T4 TOTAL: 10.4 ug/dL (ref 4.5–12.0)
TSH: 1.42 u[IU]/mL (ref 0.450–4.500)

## 2014-03-14 ENCOUNTER — Other Ambulatory Visit: Payer: Self-pay | Admitting: Nurse Practitioner

## 2014-03-20 ENCOUNTER — Other Ambulatory Visit: Payer: Self-pay | Admitting: Nurse Practitioner

## 2014-03-25 ENCOUNTER — Encounter: Payer: Self-pay | Admitting: Cardiovascular Disease

## 2014-03-25 ENCOUNTER — Ambulatory Visit (INDEPENDENT_AMBULATORY_CARE_PROVIDER_SITE_OTHER): Payer: Medicare Other | Admitting: Cardiovascular Disease

## 2014-03-25 VITALS — BP 146/73 | HR 68 | Ht 64.0 in | Wt 214.4 lb

## 2014-03-25 DIAGNOSIS — I4891 Unspecified atrial fibrillation: Secondary | ICD-10-CM

## 2014-03-25 NOTE — Progress Notes (Signed)
HPI:  80 year old woman presenting for followup of atrial fibrillation. She was initially diagnosed in 2013 she presented with a right brain stroke in the setting of atrial fibrillation. She was initially anticoagulated with Xarelto but then was transitioned to warfarin because of cost. LV function has been normal. She presents today for followup evaluation.  The patient is doing well. She denies chest pain, palpitations, edema, orthopnea, or PND. She has mild exertional dyspnea but this is unchanged.  Outpatient Encounter Prescriptions as of 03/25/2014  Medication Sig  . amLODipine (NORVASC) 2.5 MG tablet Take 1 tablet (2.5 mg total) by mouth daily.  Marland Kitchen atorvastatin (LIPITOR) 10 MG tablet Take 1 tablet (10 mg total) by mouth daily at 6 PM.  . betamethasone dipropionate (DIPROLENE) 0.05 % cream Apply to affected areas as needed  . furosemide (LASIX) 20 MG tablet TAKE 1 TABLET EVERY DAY AS NEEDED  . levETIRAcetam (KEPPRA) 500 MG tablet TAKE 1 TABLET (500 MG TOTAL) BY MOUTH 2 (TWO) TIMES DAILY.  Marland Kitchen levothyroxine (SYNTHROID, LEVOTHROID) 88 MCG tablet TAKE 1 TABLET EVERY DAY  . lisinopril (PRINIVIL,ZESTRIL) 20 MG tablet Take 1 tablet (20 mg total) by mouth daily.  . metoprolol (LOPRESSOR) 50 MG tablet TAKE 2 TABLETS IN AM AND 1 AT BEDTIME  . niacin (NIASPAN) 500 MG CR tablet Take 1 tablet (500 mg total) by mouth at bedtime.  Marland Kitchen warfarin (COUMADIN) 7.5 MG tablet Take 1 tablet (7.5 mg total) by mouth one time only at 6 PM.  . [DISCONTINUED] amLODipine (NORVASC) 2.5 MG tablet Take 1 tablet (2.5 mg total) by mouth daily.  . [DISCONTINUED] atorvastatin (LIPITOR) 10 MG tablet Take 1 tablet (10 mg total) by mouth daily at 6 PM.  . [DISCONTINUED] levETIRAcetam (KEPPRA) 500 MG tablet TAKE 1 TABLET (500 MG TOTAL) BY MOUTH 2 (TWO) TIMES DAILY.  . [DISCONTINUED] levothyroxine (SYNTHROID, LEVOTHROID) 88 MCG tablet TAKE 1 TABLET EVERY DAY  . [DISCONTINUED] levothyroxine (SYNTHROID, LEVOTHROID) 88 MCG tablet TAKE  1 TABLET EVERY DAY  . [DISCONTINUED] lisinopril (PRINIVIL,ZESTRIL) 20 MG tablet Take 20 mg by mouth daily. Take as directed  . [DISCONTINUED] metoprolol (LOPRESSOR) 50 MG tablet TAKE 2 TABLETS IN AM AND 1 AT BEDTIME  . [DISCONTINUED] niacin (NIASPAN) 500 MG CR tablet Take 1 tablet (500 mg total) by mouth at bedtime.  . [DISCONTINUED] warfarin (COUMADIN) 7.5 MG tablet Take 1 tablet (7.5 mg total) by mouth one time only at 6 PM.    Allergies  Allergen Reactions  . Sulfa Antibiotics Rash    rash    Past Medical History  Diagnosis Date  . Hypertension   . Hypothyroidism   . Atrial fibrillation 06/2012    Echocardiogram 06/30/12: EF 60-65%, mild MR, PASP 33.  Marland Kitchen Acute ischemic stroke 06/2012    acute right parietal ischemic stroke - likely cardioembolic in setting of AFib => coumadin started and followed by PCP  . Seizure 06/2012    in the setting of acute ischemic stroke  . Lichen sclerosus et atrophicus of the vulva   . Hyperlipidemia     ROS: Negative except as per HPI  BP 146/73  Pulse 68  Ht 5\' 4"  (1.626 m)  Wt 214 lb 6.4 oz (97.251 kg)  BMI 36.78 kg/m2  PHYSICAL EXAM: Pt is alert and oriented, pleasant overweight woman in NAD HEENT: normal Neck: JVP - normal, carotids 2+= without bruits Lungs: CTA bilaterally CV: RRR without murmur or gallop Abd: soft, NT, Positive BS, no hepatomegaly Ext: no C/C/E, distal pulses  intact and equal Skin: warm/dry no rash  EKG:  Atrial fibrillation, 68 beats per minute, otherwise within normal limits.  ASSESSMENT AND PLAN: 1. Chronic atrial fibrillation. Patient is asymptomatic. She is tolerating long-term warfarin without bleeding problems. Will continue the same medications and see her back in one year.  2. Hypertension. Blood pressure has been controlled on a combination of metoprolol, lisinopril, and low-dose amlodipine. Her leg swelling has resolved since reducing amlodipine 2.5 mg daily.  3. Hyperlipidemia. She is managed with a  combination of atorvastatin and niacin. She reports flushing with niacin. Apparently her triglycerides have been quite high and she is managed by primary care.  For followup I will see her back in one year.  Sherren Mocha 03/25/2014 2:53 PM

## 2014-03-25 NOTE — Patient Instructions (Signed)
Your physician wants you to follow-up in: 1 YEAR with Dr Cooper.  You will receive a reminder letter in the mail two months in advance. If you don't receive a letter, please call our office to schedule the follow-up appointment.  Your physician recommends that you continue on your current medications as directed. Please refer to the Current Medication list given to you today.  

## 2014-04-17 ENCOUNTER — Ambulatory Visit (INDEPENDENT_AMBULATORY_CARE_PROVIDER_SITE_OTHER): Payer: Medicare Other | Admitting: Pharmacist

## 2014-04-17 DIAGNOSIS — I4891 Unspecified atrial fibrillation: Secondary | ICD-10-CM | POA: Diagnosis not present

## 2014-04-17 LAB — POCT INR: INR: 2.4

## 2014-04-17 NOTE — Patient Instructions (Signed)
Anticoagulation Dose Instructions as of 04/17/2014     Melissa Knight Tue Wed Thu Fri Sat   New Dose 3.75 mg 3.75 mg 3.75 mg 3.75 mg 3.75 mg 3.75 mg 3.75 mg    Description       Continue warfarin 1/2 tablet every day.      INR was 2.4 today

## 2014-04-22 ENCOUNTER — Other Ambulatory Visit: Payer: Self-pay | Admitting: Nurse Practitioner

## 2014-04-24 ENCOUNTER — Other Ambulatory Visit: Payer: Self-pay | Admitting: Nurse Practitioner

## 2014-05-30 ENCOUNTER — Other Ambulatory Visit: Payer: Self-pay | Admitting: Family Medicine

## 2014-05-31 ENCOUNTER — Other Ambulatory Visit: Payer: Self-pay | Admitting: Family Medicine

## 2014-06-05 ENCOUNTER — Encounter: Payer: Self-pay | Admitting: Nurse Practitioner

## 2014-06-05 ENCOUNTER — Ambulatory Visit (INDEPENDENT_AMBULATORY_CARE_PROVIDER_SITE_OTHER): Payer: Medicare Other | Admitting: Nurse Practitioner

## 2014-06-05 VITALS — BP 141/92 | HR 75 | Temp 97.8°F | Ht 64.0 in | Wt 214.0 lb

## 2014-06-05 DIAGNOSIS — Z6836 Body mass index (BMI) 36.0-36.9, adult: Secondary | ICD-10-CM

## 2014-06-05 DIAGNOSIS — R635 Abnormal weight gain: Secondary | ICD-10-CM

## 2014-06-05 DIAGNOSIS — E8881 Metabolic syndrome: Secondary | ICD-10-CM

## 2014-06-05 DIAGNOSIS — E039 Hypothyroidism, unspecified: Secondary | ICD-10-CM | POA: Insufficient documentation

## 2014-06-05 DIAGNOSIS — E785 Hyperlipidemia, unspecified: Secondary | ICD-10-CM

## 2014-06-05 DIAGNOSIS — I4891 Unspecified atrial fibrillation: Secondary | ICD-10-CM | POA: Diagnosis not present

## 2014-06-05 DIAGNOSIS — I482 Chronic atrial fibrillation, unspecified: Secondary | ICD-10-CM

## 2014-06-05 DIAGNOSIS — G40319 Generalized idiopathic epilepsy and epileptic syndromes, intractable, without status epilepticus: Secondary | ICD-10-CM

## 2014-06-05 DIAGNOSIS — I6350 Cerebral infarction due to unspecified occlusion or stenosis of unspecified cerebral artery: Secondary | ICD-10-CM | POA: Diagnosis not present

## 2014-06-05 DIAGNOSIS — I6389 Other cerebral infarction: Secondary | ICD-10-CM

## 2014-06-05 DIAGNOSIS — E669 Obesity, unspecified: Secondary | ICD-10-CM

## 2014-06-05 DIAGNOSIS — I10 Essential (primary) hypertension: Secondary | ICD-10-CM

## 2014-06-05 LAB — POCT INR: INR: 2.7

## 2014-06-05 NOTE — Progress Notes (Signed)
Subjective:    Patient ID: Melissa Knight, female    DOB: Sep 08, 1933, 80 y.o.   MRN: [id number]  Patient here today for follow up - no complaints today  Hyperlipidemia This is a chronic problem. The current episode started more than 1 year ago. The problem is controlled. Recent lipid tests were reviewed and are normal. Exacerbating diseases include hypothyroidism and obesity. There are no known factors aggravating her hyperlipidemia. Pertinent negatives include no chest pain, focal sensory loss, focal weakness, leg pain, myalgias or shortness of breath. Current antihyperlipidemic treatment includes statins. The current treatment provides significant improvement of lipids. Compliance problems include adherence to diet and adherence to exercise.  Risk factors for coronary artery disease include hypertension, obesity and post-menopausal.  Hypertension This is a chronic problem. The current episode started more than 1 year ago. The problem is unchanged. The problem is controlled. Associated symptoms include peripheral edema. Pertinent negatives include no blurred vision, chest pain, headaches, orthopnea, palpitations or shortness of breath. Agents associated with hypertension include thyroid hormones. Risk factors for coronary artery disease include obesity, post-menopausal state and sedentary lifestyle. Past treatments include calcium channel blockers, diuretics, ACE inhibitors and beta blockers. The current treatment provides significant improvement. Compliance problems include exercise and diet.  Hypertensive end-organ damage includes CAD/MI, CVA (06/2012) and a thyroid problem.  Thyroid Problem Presents for follow-up visit. Patient reports no anxiety, cold intolerance, constipation, diarrhea, dry skin, heat intolerance, hoarse voice, palpitations or visual change. The symptoms have been stable. Her past medical history is significant for hyperlipidemia.  Seizures  This is a new problem. The current episode  started 12 to 24 hours ago. The problem has been resolved. There was 1 seizure. The most recent episode lasted 2 to 5 minutes. Pertinent negatives include no headaches, no chest pain and no diarrhea. Characteristics include eye blinking, rhythmic jerking, loss of consciousness and bit tongue. Characteristics do not include eye deviation, bowel incontinence or bladder incontinence. The episode was witnessed. There was no sensation of an aura present. The seizures did not continue in the ED.  atrial fib Coumadin level to be checked today   Review of Systems  HENT: Negative for hoarse voice.   Eyes: Negative for blurred vision.  Respiratory: Negative for shortness of breath.   Cardiovascular: Negative for chest pain, palpitations and orthopnea.  Gastrointestinal: Negative for diarrhea, constipation and bowel incontinence.  Endocrine: Negative for cold intolerance and heat intolerance.  Genitourinary: Negative for bladder incontinence.  Musculoskeletal: Negative for myalgias.  Neurological: Positive for seizures and loss of consciousness. Negative for focal weakness and headaches.  All other systems reviewed and are negative.      Objective:   Physical Exam  Constitutional: She is oriented to person, place, and time. She appears well-developed and well-nourished.  HENT:  Nose: Nose normal.  Mouth/Throat: Oropharynx is clear and moist.  Eyes: EOM are normal.  Neck: Trachea normal, normal range of motion and full passive range of motion without pain. Neck supple. No JVD present. Carotid bruit is not present. No thyromegaly present.  Cardiovascular: Normal rate, regular rhythm, normal heart sounds and intact distal pulses.  Exam reveals no gallop and no friction rub.   No murmur heard. Pulmonary/Chest: Effort normal and breath sounds normal.  Abdominal: Soft. Bowel sounds are normal. She exhibits no distension and no mass. There is no tenderness.  Musculoskeletal: Normal range of motion. She  exhibits edema (1+ edema bil lower ext).  Lymphadenopathy:    She has no cervical adenopathy.  Neurological: She is alert and oriented to person, place, and time. She has normal reflexes.  Skin: Skin is warm and dry.  Psychiatric: She has a normal mood and affect. Her behavior is normal. Judgment and thought content normal.   BP 141/92  Pulse 75  Temp(Src) 97.8 F (36.6 C) (Oral)  Ht 5' 4"  (1.626 m)  Wt 214 lb (97.07 kg)  BMI 36.72 kg/m2        Assessment & Plan:   1. Metabolic syndrome  2. Obesity (BMI 30-39.9) Discussed diet and exercise for person with BMI >25 Will recheck weight in 3-6 months  3. Hyperlipidemia - NMR, lipoprofile  4. HTN (hypertension), malignant Low Na+ diet - CMP14+EGFR  5. Cerebral infarction due to other mechanism  6. Chronic atrial fibrillation - POCT INR  7. Hypothyroidism (acquired) - Thyroid Panel With TSH   8. Seizure disorder, generalized convulsive, intractable  9. BMI 36.0-36.9,adult Discussed diet and exercise for person with BMI >25 Will recheck weight in 3-6 months  Labs pending Health maintenance reviewed Diet and exercise encouraged Continue all meds Follow up  In 3 months   [place], FNP

## 2014-06-05 NOTE — Patient Instructions (Signed)
Anticoagulation Dose Instructions as of 06/05/2014     Melissa Knight Tue Wed Thu Fri Sat   New Dose 3.75 mg 3.75 mg 3.75 mg 3.75 mg 3.75 mg 3.75 mg 3.75 mg    Description       Continue warfarin 1/2 tablet every day.     Recheck INR in 4 weeks

## 2014-06-06 LAB — CMP14+EGFR
ALBUMIN: 3.9 g/dL (ref 3.5–4.8)
ALT: 13 IU/L (ref 0–32)
AST: 20 IU/L (ref 0–40)
Albumin/Globulin Ratio: 1.9 (ref 1.1–2.5)
Alkaline Phosphatase: 54 IU/L (ref 39–117)
BUN/Creatinine Ratio: 14 (ref 11–26)
BUN: 11 mg/dL (ref 8–27)
CHLORIDE: 104 mmol/L (ref 97–108)
CO2: 25 mmol/L (ref 18–29)
Calcium: 8.9 mg/dL (ref 8.7–10.3)
Creatinine, Ser: 0.77 mg/dL (ref 0.57–1.00)
GFR calc Af Amer: 85 mL/min/{1.73_m2} (ref >59)
GFR calc non Af Amer: 74 mL/min/{1.73_m2} (ref >59)
Globulin, Total: 2.1 g/dL (ref 1.5–4.5)
Glucose: 94 mg/dL (ref 65–99)
Potassium: 4.7 mmol/L (ref 3.5–5.2)
Sodium: 142 mmol/L (ref 134–144)
TOTAL PROTEIN: 6 g/dL (ref 6.0–8.5)
Total Bilirubin: 0.5 mg/dL (ref 0.0–1.2)

## 2014-06-06 LAB — THYROID PANEL WITH TSH
FREE THYROXINE INDEX: 2.7 (ref 1.2–4.9)
T3 UPTAKE RATIO: 28 % (ref 24–39)
T4 TOTAL: 9.6 ug/dL (ref 4.5–12.0)
TSH: 1.61 u[IU]/mL (ref 0.450–4.500)

## 2014-06-06 LAB — NMR, LIPOPROFILE
CHOLESTEROL: 179 mg/dL (ref 100–199)
HDL Cholesterol by NMR: 57 mg/dL (ref >39)
HDL Particle Number: 32 umol/L (ref >=30.5)
LDL PARTICLE NUMBER: 864 nmol/L (ref <1000)
LDL Size: 22.1 nm (ref >20.5)
LDLC SERPL CALC-MCNC: 92 mg/dL (ref 0–99)
LP-IR Score: 42 (ref <=45)
Small LDL Particle Number: 139 nmol/L (ref <=527)
TRIGLYCERIDES BY NMR: 151 mg/dL — AB (ref 0–149)

## 2014-06-09 ENCOUNTER — Telehealth: Payer: Self-pay | Admitting: Family Medicine

## 2014-06-09 NOTE — Telephone Encounter (Signed)
Message copied by Waverly Ferrari on Mon Jun 09, 2014  8:59 AM ------      Message from: Chevis Pretty      Created: Fri Jun 06, 2014  3:23 PM       Kidney and liver function stable      Cholesterol looks great      Thyroid normal      Continue current meds- low fat diet and exercise and recheck in 3 months       ------

## 2014-07-10 ENCOUNTER — Ambulatory Visit (INDEPENDENT_AMBULATORY_CARE_PROVIDER_SITE_OTHER): Payer: Medicare Other | Admitting: Pharmacist

## 2014-07-10 ENCOUNTER — Other Ambulatory Visit: Payer: Self-pay | Admitting: *Deleted

## 2014-07-10 DIAGNOSIS — I482 Chronic atrial fibrillation, unspecified: Secondary | ICD-10-CM

## 2014-07-10 DIAGNOSIS — Z23 Encounter for immunization: Secondary | ICD-10-CM

## 2014-07-10 DIAGNOSIS — I638 Other cerebral infarction: Secondary | ICD-10-CM

## 2014-07-10 LAB — POCT INR: INR: 3

## 2014-07-10 NOTE — Progress Notes (Signed)
See anticoagulation notes.

## 2014-07-10 NOTE — Patient Instructions (Signed)
Anticoagulation Dose Instructions as of 07/10/2014     Melissa Knight Tue Wed Thu Fri Sat   New Dose 3.75 mg 3.75 mg 3.75 mg 3.75 mg 3.75 mg 3.75 mg 3.75 mg    Description       Continue warfarin 1/2 tablet every day.      INR was 3.0 today

## 2014-07-11 ENCOUNTER — Other Ambulatory Visit: Payer: Self-pay | Admitting: Nurse Practitioner

## 2014-08-14 ENCOUNTER — Ambulatory Visit (INDEPENDENT_AMBULATORY_CARE_PROVIDER_SITE_OTHER): Payer: Medicare Other | Admitting: Pharmacist

## 2014-08-14 DIAGNOSIS — I482 Chronic atrial fibrillation: Secondary | ICD-10-CM | POA: Diagnosis not present

## 2014-08-14 LAB — POCT INR: INR: 2.9

## 2014-08-14 NOTE — Patient Instructions (Signed)
Anticoagulation Dose Instructions as of 08/14/2014      Dorene Grebe Tue Wed Thu Fri Sat   New Dose 3.75 mg 3.75 mg 3.75 mg 3.75 mg 3.75 mg 3.75 mg 3.75 mg    Description        Continue warfarin 1/2 tablet every day.      INR was 2.9 today

## 2014-08-24 ENCOUNTER — Other Ambulatory Visit: Payer: Self-pay | Admitting: Nurse Practitioner

## 2014-08-29 ENCOUNTER — Other Ambulatory Visit: Payer: Self-pay | Admitting: Nurse Practitioner

## 2014-08-30 ENCOUNTER — Other Ambulatory Visit: Payer: Self-pay | Admitting: Nurse Practitioner

## 2014-09-10 ENCOUNTER — Ambulatory Visit (INDEPENDENT_AMBULATORY_CARE_PROVIDER_SITE_OTHER): Payer: Medicare Other | Admitting: Nurse Practitioner

## 2014-09-10 ENCOUNTER — Encounter: Payer: Self-pay | Admitting: Nurse Practitioner

## 2014-09-10 VITALS — BP 144/96 | HR 63 | Temp 97.0°F | Ht 64.0 in | Wt 212.0 lb

## 2014-09-10 DIAGNOSIS — I10 Essential (primary) hypertension: Secondary | ICD-10-CM

## 2014-09-10 DIAGNOSIS — E039 Hypothyroidism, unspecified: Secondary | ICD-10-CM | POA: Diagnosis not present

## 2014-09-10 DIAGNOSIS — I638 Other cerebral infarction: Secondary | ICD-10-CM

## 2014-09-10 DIAGNOSIS — I482 Chronic atrial fibrillation, unspecified: Secondary | ICD-10-CM

## 2014-09-10 DIAGNOSIS — E669 Obesity, unspecified: Secondary | ICD-10-CM | POA: Diagnosis not present

## 2014-09-10 DIAGNOSIS — E8881 Metabolic syndrome: Secondary | ICD-10-CM | POA: Diagnosis not present

## 2014-09-10 DIAGNOSIS — G40319 Generalized idiopathic epilepsy and epileptic syndromes, intractable, without status epilepticus: Secondary | ICD-10-CM

## 2014-09-10 DIAGNOSIS — G40311 Generalized idiopathic epilepsy and epileptic syndromes, intractable, with status epilepticus: Secondary | ICD-10-CM | POA: Diagnosis not present

## 2014-09-10 DIAGNOSIS — E785 Hyperlipidemia, unspecified: Secondary | ICD-10-CM

## 2014-09-10 LAB — POCT INR: INR: 2.3

## 2014-09-10 MED ORDER — WARFARIN SODIUM 7.5 MG PO TABS: 7.5000 mg | ORAL_TABLET | Freq: Once | ORAL | Status: DC

## 2014-09-10 MED ORDER — METOPROLOL TARTRATE 50 MG PO TABS: ORAL_TABLET | ORAL | Status: DC

## 2014-09-10 MED ORDER — AMLODIPINE BESYLATE 2.5 MG PO TABS: 2.5000 mg | ORAL_TABLET | Freq: Every day | ORAL | Status: DC

## 2014-09-10 MED ORDER — LISINOPRIL 20 MG PO TABS: ORAL_TABLET | ORAL | Status: DC

## 2014-09-10 MED ORDER — LEVETIRACETAM 500 MG PO TABS: ORAL_TABLET | ORAL | Status: DC

## 2014-09-10 MED ORDER — ATORVASTATIN CALCIUM 10 MG PO TABS: 10.0000 mg | ORAL_TABLET | Freq: Every day | ORAL | Status: DC

## 2014-09-10 MED ORDER — LEVOTHYROXINE SODIUM 88 MCG PO TABS: ORAL_TABLET | ORAL | Status: DC

## 2014-09-10 MED ORDER — FUROSEMIDE 20 MG PO TABS: ORAL_TABLET | ORAL | Status: DC

## 2014-09-10 MED ORDER — NIACIN ER (ANTIHYPERLIPIDEMIC) 500 MG PO TBCR: EXTENDED_RELEASE_TABLET | ORAL | Status: DC

## 2014-09-10 NOTE — Progress Notes (Signed)
Subjective:    Patient ID: Melissa Knight, female    DOB: 08/19/1933, 80 y.o.   MRN: [id number]  Patient here today for follow up - no complaints today.  Hyperlipidemia This is a chronic problem. The current episode started more than 1 year ago. The problem is controlled. Exacerbating diseases include hypothyroidism and obesity. She has no history of diabetes. Pertinent negatives include no chest pain, myalgias or shortness of breath. Current antihyperlipidemic treatment includes statins. The current treatment provides moderate improvement of lipids. Compliance problems include adherence to diet and adherence to exercise.  Risk factors for coronary artery disease include dyslipidemia, family history, hypertension, obesity and post-menopausal.  Hypertension This is a chronic problem. The current episode started more than 1 year ago. The problem is controlled. Pertinent negatives include no chest pain, headaches, palpitations or shortness of breath. Risk factors for coronary artery disease include dyslipidemia, obesity and post-menopausal state. Past treatments include diuretics, ACE inhibitors and calcium channel blockers. The current treatment provides moderate improvement. Compliance problems include diet and exercise.  Hypertensive end-organ damage includes a thyroid problem.  Thyroid Problem Patient reports no cold intolerance, constipation, diarrhea, heat intolerance, palpitations or visual change. Her past medical history is significant for hyperlipidemia. There is no history of diabetes.  Seizures  Pertinent negatives include no headaches, no chest pain and no diarrhea.  atrial fib Coumadin level to be checked today   Review of Systems  Respiratory: Negative for shortness of breath.   Cardiovascular: Negative for chest pain and palpitations.  Gastrointestinal: Negative for diarrhea and constipation.  Endocrine: Negative for cold intolerance and heat intolerance.  Musculoskeletal: Negative  for myalgias.  Neurological: Positive for seizures. Negative for headaches.  All other systems reviewed and are negative.      Objective:   Physical Exam  Constitutional: She is oriented to person, place, and time. She appears well-developed and well-nourished.  HENT:  Nose: Nose normal.  Mouth/Throat: Oropharynx is clear and moist.  Eyes: EOM are normal.  Neck: Trachea normal, normal range of motion and full passive range of motion without pain. Neck supple. No JVD present. Carotid bruit is not present. No thyromegaly present.  Cardiovascular: Normal rate, regular rhythm, normal heart sounds and intact distal pulses.  Exam reveals no gallop and no friction rub.   No murmur heard. Pulmonary/Chest: Effort normal and breath sounds normal.  Abdominal: Soft. Bowel sounds are normal. She exhibits no distension and no mass. There is no tenderness.  Musculoskeletal: Normal range of motion.  Lymphadenopathy:    She has no cervical adenopathy.  Neurological: She is alert and oriented to person, place, and time. She has normal reflexes.  Skin: Skin is warm and dry.  Psychiatric: She has a normal mood and affect. Her behavior is normal. Judgment and thought content normal.   BP 144/96 mmHg  Pulse 63  Temp(Src) 97 F (36.1 C) (Oral)  Ht _0  (1.626 m)  Wt 212 lb (96.163 kg)  BMI 36.37 kg/m2        Assessment & Plan:   1. Chronic atrial fibrillation Recheck INR in 6 weeks - POCT INR - warfarin (COUMADIN) 7.5 MG tablet; Take 1 tablet (7.5 mg total) by mouth one time only at 6 PM.  Dispense: 90 tablet; Refill: 3  2. Atrial fibrillation, chronic  3. HTN (hypertension), malignant Do not add salt to diet - amLODipine (NORVASC) 2.5 MG tablet; Take 1 tablet (2.5 mg total) by mouth daily.  Dispense: 90 tablet; Refill: 1 - metoprolol (LOPRESSOR)  50 MG tablet; TAKE 2 TABLETS IN AM AND 1 AT BEDTIME  Dispense: 270 tablet; Refill: 1 - furosemide (LASIX) 20 MG tablet; TAKE 1 TABLET EVERY DAY  AS NEEDED  Dispense: 30 tablet; Refill: 1 - lisinopril (PRINIVIL,ZESTRIL) 20 MG tablet; TAKE 1 TABLET (20 MG TOTAL) BY MOUTH 2 (TWO) TIMES DAILY.  Dispense: 180 tablet; Refill: 1 - CMP14+EGFR  4. Hypothyroidism (acquired)  5. Hyperlipidemia Low fat  - atorvastatin (LIPITOR) 10 MG tablet; Take 1 tablet (10 mg total) by mouth daily at 6 PM.  Dispense: 90 tablet; Refill: 1 - niacin (NIASPAN) 500 MG CR tablet; TAKE 1 TABLET (500 MG TOTAL) BY MOUTH AT BEDTIME.  Dispense: 90 tablet; Refill: 1 - NMR, lipoprofile  6. Metabolic syndrome Watch carbs in diet  7. Obesity (BMI 30-39.9) Discussed diet and exercise for person with BMI >25 Will recheck weight in 3-6 months\  8. Hypothyroidism, unspecified hypothyroidism type - levothyroxine (SYNTHROID, LEVOTHROID) 88 MCG tablet; TAKE 1 TABLET EVERY DAY  Dispense: 90 tablet; Refill: 1 - Thyroid Panel With TSH  9. Seizure disorder, generalized convulsive, intractable - levETIRAcetam (KEPPRA) 500 MG tablet; TAKE 1 TABLET (500 MG TOTAL) BY MOUTH 2 (TWO) TIMES DAILY.  Dispense: 60 tablet; Refill: 5  hemoccult cards given to patient with directions Refuses colonoscopy, shingles and tetanus Labs pendirng Health maintenance reviewed Diet and exercise encouraged Continue all meds Follow up  In 3 month   [place], FNP

## 2014-09-10 NOTE — Patient Instructions (Addendum)
What are Advance Directives? A living will allows you to document your wishes concerning medical treatments at the end of life.   Before your living will can guide medical decision-making two physicians must certify: You are unable to make medical decisions,  You are in the medical condition specified in the state's living will law (such as "terminal illness" or "permanent unconsciousness"),  Other requirements also may apply, depending upon the state. A medical power of attorney (or healthcare proxy) allows you to appoint a person you trust as your healthcare agent (or surrogate decision maker), who is authorized to make medical decisions on your behalf.   Before a medical power of attorney goes into effect a person's physician must conclude that they are unable to make their own medical decisions. In addition: If a person regains the ability to make decisions, the agent cannot continue to act on the person's behalf.  Many states have additional requirements that apply only to decisions about life-sustaining medical treatments.  For example, before your agent can refuse a life-sustaining treatment on your behalf, a second physician may have to confirm your doctor's assessment that you are incapable of making treatment decisions. What Else Do I Need to Know?  Advance directives are legally valid throughout the [place]. While you do not need a lawyer to fill out an advance directive, your advance directive becomes legally valid as soon as you sign them in front of the required witnesses. The laws governing advance directives vary from state to state, so it is important to complete and sign advance directives that comply with your state's law. Also, advance directives can have different titles in different states.  Emergency medical technicians cannot honor living wills or medical powers of attorney. Once emergency personnel have been called, they must do what is necessary to stabilize a person  for transfer to a hospital, both from accident sites and from a home or other facility. After a physician fully evaluates the person's condition and determines the underlying conditions, advance directives can be implemented.  One state's advance directive does not always work in another state. Some states do honor advance directives from another state; others will honor out-of-state advance directives as long as they are similar to the state's own law; and some states do not have an answer to this question. The best solution is if you spend a significant amount of time in more than one state, you should complete the advance directives for all the states you spend a significant amount of time in.  Advance directives do not expire. An advance directive remains in effect until you change it. If you complete a new advance directive, it invalidates the previous one.  You should review your advance directives periodically to ensure that they still reflect your wishes. If you want to change anything in an advance directive once you have completed it, you should complete a whole new document. [facility], [email or web address]  Anticoagulation Dose Instructions as of 09/10/2014      Dorene Grebe Tue Wed Thu Fri Sat   New Dose 3.75 mg 3.75 mg 3.75 mg 3.75 mg 3.75 mg 3.75 mg 3.75 mg    Description        Continue warfarin 1/2 tablet every day.     Recheck in 6 weeks

## 2014-09-11 LAB — CMP14+EGFR
A/G RATIO: 2 (ref 1.1–2.5)
ALT: 14 IU/L (ref 0–32)
AST: 22 IU/L (ref 0–40)
Albumin: 4 g/dL (ref 3.5–4.8)
Alkaline Phosphatase: 56 IU/L (ref 39–117)
BUN/Creatinine Ratio: 13 (ref 11–26)
BUN: 10 mg/dL (ref 8–27)
CO2: 24 mmol/L (ref 18–29)
Calcium: 8.9 mg/dL (ref 8.7–10.3)
Chloride: 104 mmol/L (ref 97–108)
Creatinine, Ser: 0.78 mg/dL (ref 0.57–1.00)
GFR calc Af Amer: 84 mL/min/{1.73_m2} (ref >59)
GFR, EST NON AFRICAN AMERICAN: 73 mL/min/{1.73_m2} (ref >59)
GLOBULIN, TOTAL: 2 g/dL (ref 1.5–4.5)
Glucose: 86 mg/dL (ref 65–99)
Potassium: 4.8 mmol/L (ref 3.5–5.2)
SODIUM: 142 mmol/L (ref 134–144)
TOTAL PROTEIN: 6 g/dL (ref 6.0–8.5)
Total Bilirubin: 0.6 mg/dL (ref 0.0–1.2)

## 2014-09-11 LAB — NMR, LIPOPROFILE
Cholesterol: 186 mg/dL (ref 100–199)
HDL Cholesterol by NMR: 62 mg/dL (ref >39)
HDL Particle Number: 32.4 umol/L (ref >=30.5)
LDL Particle Number: 989 nmol/L (ref <1000)
LDL Size: 22.1 nm (ref >20.5)
LDL-C: 101 mg/dL — AB (ref 0–99)
LP-IR Score: 26 (ref <=45)
Small LDL Particle Number: 162 nmol/L (ref <=527)
TRIGLYCERIDES BY NMR: 114 mg/dL (ref 0–149)

## 2014-09-11 LAB — THYROID PANEL WITH TSH
Free Thyroxine Index: 3.3 (ref 1.2–4.9)
T3 Uptake Ratio: 30 % (ref 24–39)
T4 TOTAL: 11.1 ug/dL (ref 4.5–12.0)
TSH: 2.02 u[IU]/mL (ref 0.450–4.500)

## 2014-09-18 ENCOUNTER — Other Ambulatory Visit: Payer: Self-pay | Admitting: Nurse Practitioner

## 2014-10-10 ENCOUNTER — Encounter: Payer: Self-pay | Admitting: Pharmacist

## 2014-10-10 ENCOUNTER — Ambulatory Visit (INDEPENDENT_AMBULATORY_CARE_PROVIDER_SITE_OTHER): Payer: Medicare Other | Admitting: Pharmacist

## 2014-10-10 VITALS — BP 134/80 | HR 82 | Ht 64.0 in | Wt 213.0 lb

## 2014-10-10 DIAGNOSIS — Z0000 Encounter for general adult medical examination without abnormal findings: Secondary | ICD-10-CM

## 2014-10-10 DIAGNOSIS — I482 Chronic atrial fibrillation, unspecified: Secondary | ICD-10-CM

## 2014-10-10 DIAGNOSIS — N393 Stress incontinence (female) (male): Secondary | ICD-10-CM | POA: Insufficient documentation

## 2014-10-10 DIAGNOSIS — N904 Leukoplakia of vulva: Secondary | ICD-10-CM | POA: Insufficient documentation

## 2014-10-10 LAB — POCT INR: INR: 3

## 2014-10-10 MED ORDER — MIRABEGRON ER 25 MG PO TB24: 25.0000 mg | ORAL_TABLET | Freq: Every day | ORAL | Status: DC

## 2014-10-10 NOTE — Patient Instructions (Addendum)
Anticoagulation Dose Instructions as of 10/10/2014      Dorene Grebe Tue Wed Thu Fri Sat   New Dose 3.75 mg 3.75 mg 3.75 mg 3.75 mg 3.75 mg 3.75 mg 3.75 mg    Description        Continue warfarin 1/2 tablet every day.     INR was 3.0 today   Mammogram Appointment - [facility] [place] - Tuesday, March 29th 2016 at [facility] Maintenance Summary     COLONOSCOPY Postponed 03/12/2015 Originally 10/16/1984. Patient Declined    ZOSTAVAX Postponed 03/12/2015 Originally 10/16/1994. Patient Declined    TETANUS/TDAP Postponed 03/12/2015 Originally 10/16/1953. Insurance / Financial    MAMMOGRAM Next Due 12/03/2014     INFLUENZA VACCINE Next Due 04/13/2015       Health Maintenance Summary    COLONOSCOPY Postponed 02/12/2014  Patient Declined    ZOSTAVAX Postponed 02/12/2014  Patient Declined - due to cost    TETANUS/TDAP Postponed 02/12/2014 Patient Declined    04/ Up to Date Next Due Fall 2016    DEXA - Bone density Up to Date Next Due 01/02/2016    Eye Exam - glaucoma screening Recommend Appt ASAP    Pneumonia vaccine  Completed      MAMMOGRAM Up to date Next due 12/03/2014        Remember to bring Fecal Occult Blood Test Back  Preventive Care for Adults A healthy lifestyle and preventive care can promote health and wellness. Preventive health guidelines for women include the following key practices.  A routine yearly physical is a good way to check with your health care provider about your health and preventive screening. It is a chance to share any concerns and updates on your health and to receive a thorough exam.  Visit your dentist for a routine exam and preventive care every 6 months. Brush your teeth twice a day and floss once a day. Good oral hygiene prevents tooth decay and gum disease.  The frequency of eye exams is based on your age, health, family medical history, use of contact lenses, and other factors. Follow your health care provider's recommendations  for frequency of eye exams.  Eat a healthy diet. Foods like vegetables, fruits, whole grains, low-fat dairy products, and lean protein foods contain the nutrients you need without too many calories. Decrease your intake of foods high in solid fats, added sugars, and salt. Eat the right amount of calories for you.Get information about a proper diet from your health care provider, if necessary.  Regular physical exercise is one of the most important things you can do for your health. Most adults should get at least 150 minutes of moderate-intensity exercise (any activity that increases your heart rate and causes you to sweat) each week. In addition, most adults need muscle-strengthening exercises on 2 or more days a week.  Maintain a healthy weight. The body mass index (BMI) is a screening tool to identify possible weight problems. It provides an estimate of body fat based on height and weight. Your health care provider can find your BMI and can help you achieve or maintain a healthy weight.For adults 20 years and older:  A BMI below 18.5 is considered underweight.  A BMI of 18.5 to 24.9 is normal.  A BMI of 25 to 29.9 is considered overweight.  A BMI of 30 and above is considered obese.  Maintain normal blood lipids and cholesterol levels by exercising and minimizing your intake of saturated fat.  Eat a balanced diet with plenty of fruit and vegetables. Blood tests for lipids and cholesterol should begin at age 62 and be repeated every 5 years. If your lipid or cholesterol levels are high, you are over 50, or you are at high risk for heart disease, you may need your cholesterol levels checked more frequently.Ongoing high lipid and cholesterol levels should be treated with medicines if diet and exercise are not working.  If you smoke, find out from your health care provider how to quit. If you do not use tobacco, do not start.  Lung cancer screening is recommended for adults aged 69-80 years who  are at high risk for developing lung cancer because of a history of smoking. A yearly low-dose CT scan of the lungs is recommended for people who have at least a 30-pack-year history of smoking and are a current smoker or have quit within the past 15 years. A pack year of smoking is smoking an average of 1 pack of cigarettes a day for 1 year (for example: 1 pack a day for 30 years or 2 packs a day for 15 years). Yearly screening should continue until the smoker has stopped smoking for at least 15 years. Yearly screening should be stopped for people who develop a health problem that would prevent them from having lung cancer treatment.  If you are pregnant, do not drink alcohol. If you are breastfeeding, be very cautious about drinking alcohol. If you are not pregnant and choose to drink alcohol, do not have more than 1 drink per day. One drink is considered to be 12 ounces (355 mL) of beer, 5 ounces (148 mL) of wine, or 1.5 ounces (44 mL) of liquor.  Avoid use of street drugs. Do not share needles with anyone. Ask for help if you need support or instructions about stopping the use of drugs.  High blood pressure causes heart disease and increases the risk of stroke. Your blood pressure should be checked at least every 1 to 2 years. Ongoing high blood pressure should be treated with medicines if weight loss and exercise do not work.  If you are 71-8 years old, ask your health care provider if you should take aspirin to prevent strokes.  Diabetes screening involves taking a blood sample to check your fasting blood sugar level. This should be done once every 3 years, after age 78, if you are within normal weight and without risk factors for diabetes. Testing should be considered at a younger age or be carried out more frequently if you are overweight and have at least 1 risk factor for diabetes.  Breast cancer screening is essential preventive care for women. You should practice "breast self-awareness."  This means understanding the normal appearance and feel of your breasts and may include breast self-examination. Any changes detected, no matter how small, should be reported to a health care provider. Women in their 38s and 30s should have a clinical breast exam (CBE) by a health care provider as part of a regular health exam every 1 to 3 years. After age 67, women should have a CBE every year. Starting at age 7, women should consider having a mammogram (breast X-ray test) every year. Women who have a family history of breast cancer should talk to their health care provider about genetic screening. Women at a high risk of breast cancer should talk to their health care providers about having an MRI and a mammogram every year.  Breast cancer gene (BRCA)-related cancer risk  assessment is recommended for women who have family members with BRCA-related cancers. BRCA-related cancers include breast, ovarian, tubal, and peritoneal cancers. Having family members with these cancers may be associated with an increased risk for harmful changes (mutations) in the breast cancer genes BRCA1 and BRCA2. Results of the assessment will determine the need for genetic counseling and BRCA1 and BRCA2 testing.  Routine pelvic exams to screen for cancer are no longer recommended for nonpregnant women who are considered low risk for cancer of the pelvic organs (ovaries, uterus, and vagina) and who do not have symptoms. Ask your health care provider if a screening pelvic exam is right for you.  If you have had past treatment for cervical cancer or a condition that could lead to cancer, you need Pap tests and screening for cancer for at least 20 years after your treatment. If Pap tests have been discontinued, your risk factors (such as having a new sexual partner) need to be reassessed to determine if screening should be resumed. Some women have medical problems that increase the chance of getting cervical cancer. In these cases, your  health care provider may recommend more frequent screening and Pap tests.  The HPV test is an additional test that may be used for cervical cancer screening. The HPV test looks for the virus that can cause the cell changes on the cervix. The cells collected during the Pap test can be tested for HPV. The HPV test could be used to screen women aged 60 years and older, and should be used in women of any age who have unclear Pap test results. After the age of 108, women should have HPV testing at the same frequency as a Pap test.  Colorectal cancer can be detected and often prevented. Most routine colorectal cancer screening begins at the age of 44 years and continues through age 21 years. However, your health care provider may recommend screening at an earlier age if you have risk factors for colon cancer. On a yearly basis, your health care provider may provide home test kits to check for hidden blood in the stool. Use of a small camera at the end of a tube, to directly examine the colon (sigmoidoscopy or colonoscopy), can detect the earliest forms of colorectal cancer. Talk to your health care provider about this at age 68, when routine screening begins. Direct exam of the colon should be repeated every 5-10 years through age 52 years, unless early forms of pre-cancerous polyps or small growths are found.  People who are at an increased risk for hepatitis B should be screened for this virus. You are considered at high risk for hepatitis B if:  You were born in a country where hepatitis B occurs often. Talk with your health care provider about which countries are considered high risk.  Your parents were born in a high-risk country and you have not received a shot to protect against hepatitis B (hepatitis B vaccine).  You have HIV or AIDS.  You use needles to inject street drugs.  You live with, or have sex with, someone who has hepatitis B.  You get hemodialysis treatment.  You take certain  medicines for conditions like cancer, organ transplantation, and autoimmune conditions.  Hepatitis C blood testing is recommended for all people born from [birth year] through 1965 and any individual with known risks for hepatitis C.  Practice safe sex. Use condoms and avoid high-risk sexual practices to reduce the spread of sexually transmitted infections (STIs). STIs include gonorrhea, chlamydia,  syphilis, trichomonas, herpes, HPV, and human immunodeficiency virus (HIV). Herpes, HIV, and HPV are viral illnesses that have no cure. They can result in disability, cancer, and death.  You should be screened for sexually transmitted illnesses (STIs) including gonorrhea and chlamydia if:  You are sexually active and are younger than 24 years.  You are older than 24 years and your health care provider tells you that you are at risk for this type of infection.  Your sexual activity has changed since you were last screened and you are at an increased risk for chlamydia or gonorrhea. Ask your health care provider if you are at risk.  If you are at risk of being infected with HIV, it is recommended that you take a prescription medicine daily to prevent HIV infection. This is called preexposure prophylaxis (PrEP). You are considered at risk if:  You are a heterosexual woman, are sexually active, and are at increased risk for HIV infection.  You take drugs by injection.  You are sexually active with a partner who has HIV.  Talk with your health care provider about whether you are at high risk of being infected with HIV. If you choose to begin PrEP, you should first be tested for HIV. You should then be tested every 3 months for as long as you are taking PrEP.  Osteoporosis is a disease in which the bones lose minerals and strength with aging. This can result in serious bone fractures or breaks. The risk of osteoporosis can be identified using a bone density scan. Women ages 53 years and over and women at risk  for fractures or osteoporosis should discuss screening with their health care providers. Ask your health care provider whether you should take a calcium supplement or vitamin D to reduce the rate of osteoporosis.  Menopause can be associated with physical symptoms and risks. Hormone replacement therapy is available to decrease symptoms and risks. You should talk to your health care provider about whether hormone replacement therapy is right for you.  Use sunscreen. Apply sunscreen liberally and repeatedly throughout the day. You should seek shade when your shadow is shorter than you. Protect yourself by wearing long sleeves, pants, a wide-brimmed hat, and sunglasses year round, whenever you are outdoors.  Once a month, do a whole body skin exam, using a mirror to look at the skin on your back. Tell your health care provider of new moles, moles that have irregular borders, moles that are larger than a pencil eraser, or moles that have changed in shape or color.  Stay current with required vaccines (immunizations).  Influenza vaccine. All adults should be immunized every year.  Tetanus, diphtheria, and acellular pertussis (Td, Tdap) vaccine. Pregnant women should receive 1 dose of Tdap vaccine during each pregnancy. The dose should be obtained regardless of the length of time since the last dose. Immunization is preferred during the 27th-36th week of gestation. An adult who has not previously received Tdap or who does not know her vaccine status should receive 1 dose of Tdap. This initial dose should be followed by tetanus and diphtheria toxoids (Td) booster doses every 10 years. Adults with an unknown or incomplete history of completing a 3-dose immunization series with Td-containing vaccines should begin or complete a primary immunization series including a Tdap dose. Adults should receive a Td booster every 10 years.  Varicella vaccine. An adult without evidence of immunity to varicella should receive  2 doses or a second dose if she has previously received  1 dose. Pregnant females who do not have evidence of immunity should receive the first dose after pregnancy. This first dose should be obtained before leaving the health care facility. The second dose should be obtained 4-8 weeks after the first dose.  Human papillomavirus (HPV) vaccine. Females aged 13-26 years who have not received the vaccine previously should obtain the 3-dose series. The vaccine is not recommended for use in pregnant females. However, pregnancy testing is not needed before receiving a dose. If a female is found to be pregnant after receiving a dose, no treatment is needed. In that case, the remaining doses should be delayed until after the pregnancy. Immunization is recommended for any person with an immunocompromised condition through the age of 52 years if she did not get any or all doses earlier. During the 3-dose series, the second dose should be obtained 4-8 weeks after the first dose. The third dose should be obtained 24 weeks after the first dose and 16 weeks after the second dose.  Zoster vaccine. One dose is recommended for adults aged 18 years or older unless certain conditions are present.  Measles, mumps, and rubella (MMR) vaccine. Adults born before [birth year] generally are considered immune to measles and mumps. Adults born in [birth year] or later should have 1 or more doses of MMR vaccine unless there is a contraindication to the vaccine or there is laboratory evidence of immunity to each of the three diseases. A routine second dose of MMR vaccine should be obtained at least 28 days after the first dose for students attending postsecondary schools, health care workers, or international travelers. People who received inactivated measles vaccine or an unknown type of measles vaccine during 1963-1967 should receive 2 doses of MMR vaccine. People who received inactivated mumps vaccine or an unknown type of mumps vaccine before 1979 and  are at high risk for mumps infection should consider immunization with 2 doses of MMR vaccine. For females of childbearing age, rubella immunity should be determined. If there is no evidence of immunity, females who are not pregnant should be vaccinated. If there is no evidence of immunity, females who are pregnant should delay immunization until after pregnancy. Unvaccinated health care workers born before [birth year] who lack laboratory evidence of measles, mumps, or rubella immunity or laboratory confirmation of disease should consider measles and mumps immunization with 2 doses of MMR vaccine or rubella immunization with 1 dose of MMR vaccine.  Pneumococcal 13-valent conjugate (PCV13) vaccine. When indicated, a person who is uncertain of her immunization history and has no record of immunization should receive the PCV13 vaccine. An adult aged 65 years or older who has certain medical conditions and has not been previously immunized should receive 1 dose of PCV13 vaccine. This PCV13 should be followed with a dose of pneumococcal polysaccharide (PPSV23) vaccine. The PPSV23 vaccine dose should be obtained at least 8 weeks after the dose of PCV13 vaccine. An adult aged 81 years or older who has certain medical conditions and previously received 1 or more doses of PPSV23 vaccine should receive 1 dose of PCV13. The PCV13 vaccine dose should be obtained 1 or more years after the last PPSV23 vaccine dose.  Pneumococcal polysaccharide (PPSV23) vaccine. When PCV13 is also indicated, PCV13 should be obtained first. All adults aged 16 years and older should be immunized. An adult younger than age 43 years who has certain medical conditions should be immunized. Any person who resides in a nursing home or long-term care facility should be immunized. An  adult smoker should be immunized. People with an immunocompromised condition and certain other conditions should receive both PCV13 and PPSV23 vaccines. People with human  immunodeficiency virus (HIV) infection should be immunized as soon as possible after diagnosis. Immunization during chemotherapy or radiation therapy should be avoided. Routine use of PPSV23 vaccine is not recommended for American Indians, [place] Natives, or people younger than 65 years unless there are medical conditions that require PPSV23 vaccine. When indicated, people who have unknown immunization and have no record of immunization should receive PPSV23 vaccine. One-time revaccination 5 years after the first dose of PPSV23 is recommended for people aged 19-64 years who have chronic kidney failure, nephrotic syndrome, asplenia, or immunocompromised conditions. People who received 1-2 doses of PPSV23 before age 2 years should receive another dose of PPSV23 vaccine at age 72 years or later if at least 5 years have passed since the previous dose. Doses of PPSV23 are not needed for people immunized with PPSV23 at or after age 40 years.  Meningococcal vaccine. Adults with asplenia or persistent complement component deficiencies should receive 2 doses of quadrivalent meningococcal conjugate (MenACWY-D) vaccine. The doses should be obtained at least 2 months apart. Microbiologists working with certain meningococcal bacteria, [place] recruits, people at risk during an outbreak, and people who travel to or live in countries with a high rate of meningitis should be immunized. A first-year college student up through age 84 years who is living in a residence hall should receive a dose if she did not receive a dose on or after her 16th birthday. Adults who have certain high-risk conditions should receive one or more doses of vaccine.  Hepatitis A vaccine. Adults who wish to be protected from this disease, have certain high-risk conditions, work with hepatitis A-infected animals, work in hepatitis A research labs, or travel to or work in countries with a high rate of hepatitis A should be immunized. Adults who were  previously unvaccinated and who anticipate close contact with an international adoptee during the first 60 days after arrival in the [place] from a country with a high rate of hepatitis A should be immunized.  Hepatitis B vaccine. Adults who wish to be protected from this disease, have certain high-risk conditions, may be exposed to blood or other infectious body fluids, are household contacts or sex partners of hepatitis B positive people, are clients or workers in certain care facilities, or travel to or work in countries with a high rate of hepatitis B should be immunized.  Haemophilus influenzae type b (Hib) vaccine. A previously unvaccinated person with asplenia or sickle cell disease or having a scheduled splenectomy should receive 1 dose of Hib vaccine. Regardless of previous immunization, a recipient of a hematopoietic stem cell transplant should receive a 3-dose series 6-12 months after her successful transplant. Hib vaccine is not recommended for adults with HIV infection. Preventive Services / Frequency Ages 67 years and over  Blood pressure check.** / Every 1 to 2 years.  Lipid and cholesterol check.** / Every 5 years beginning at age 5 years.  Lung cancer screening. / Every year if you are aged 64-80 years and have a 30-pack-year history of smoking and currently smoke or have quit within the past 15 years. Yearly screening is stopped once you have quit smoking for at least 15 years or develop a health problem that would prevent you from having lung cancer treatment.  Clinical breast exam.** / Every year after age 12 years.  BRCA-related cancer risk assessment.** /  For women who have family members with a BRCA-related cancer (breast, ovarian, tubal, or peritoneal cancers).  Mammogram.** / Every year beginning at age 51 years and continuing for as long as you are in good health. Consult with your health care provider.  Pap test.** / Every 3 years starting at age 53 years  through age 67 or 61 years with 3 consecutive normal Pap tests. Testing can be stopped between 65 and 70 years with 3 consecutive normal Pap tests and no abnormal Pap or HPV tests in the past 10 years.  HPV screening.** / Every 3 years from ages 90 years through ages 59 or 71 years with a history of 3 consecutive normal Pap tests. Testing can be stopped between 65 and 70 years with 3 consecutive normal Pap tests and no abnormal Pap or HPV tests in the past 10 years.  Fecal occult blood test (FOBT) of stool. / Every year beginning at age 54 years and continuing until age 65 years. You may not need to do this test if you get a colonoscopy every 10 years.  Flexible sigmoidoscopy or colonoscopy.** / Every 5 years for a flexible sigmoidoscopy or every 10 years for a colonoscopy beginning at age 22 years and continuing until age 67 years.  Hepatitis C blood test.** / For all people born from [birth year] through 1963 and any individual with known risks for hepatitis C.  Osteoporosis screening.** / A one-time screening for women ages 78 years and over and women at risk for fractures or osteoporosis.  Skin self-exam. / Monthly.  Influenza vaccine. / Every year.  Tetanus, diphtheria, and acellular pertussis (Tdap/Td) vaccine.** / 1 dose of Td every 10 years.  Varicella vaccine.** / Consult your health care provider.  Zoster vaccine.** / 1 dose for adults aged 3 years or older.  Pneumococcal 13-valent conjugate (PCV13) vaccine.** / Consult your health care provider.  Pneumococcal polysaccharide (PPSV23) vaccine.** / 1 dose for all adults aged 31 years and older.  Meningococcal vaccine.** / Consult your health care provider.  Hepatitis A vaccine.** / Consult your health care provider.  Hepatitis B vaccine.** / Consult your health care provider.  Haemophilus influenzae type b (Hib) vaccine.** / Consult your health care provider. ** Family history and personal history of risk and conditions may change  your health care provider's recommendations. Document Released: 10/25/2001 Document Revised: 01/13/2014 Document Reviewed: 01/24/2011 [facility] Patient Information 2015 [place], [place]. This information is not intended to replace advice given to you by your health care provider. Make sure you discuss any questions you have with your health care provider.

## 2014-10-10 NOTE — Progress Notes (Signed)
Patient ID: Melissa Knight, female   DOB: [birth year], 81 y.o.   MRN: [id number] Subjective:    Melissa Knight is a 81 y.o. female who presents for Medicare Subsequent Wellness Visit and recheck protime.  Patient also inquires about something for urinary incontinence.  She has taken medications in the past but caused dry mouth.  Though she feels that UI is getting worse and would like to retry.    Preventive Screening-Counseling & Management  Tobacco History  Smoking status  . Former Smoker -- 1.00 packs/day  . Types: Cigarettes  . Quit date: 09/30/1978  Smokeless tobacco  . Never Used     Current Problems (verified) Patient Active Problem List   Diagnosis Date Noted  . Lichen sclerosus of female genitalia 10/10/2014  . Stress incontinence 10/10/2014  . Hypothyroidism (acquired) 06/05/2014  . Obesity (BMI 30-39.9) 01/01/2014  . Metabolic syndrome 95/62/1308  . Pre-diabetes 01/01/2014  . Hyperlipidemia 02/05/2013  . Atrial fibrillation, chronic 07/02/2012  . CVA (cerebral infarction) 06/30/2012  . Seizure disorder, generalized convulsive, intractable 06/30/2012  . HTN (hypertension), malignant 06/30/2012    Medications Prior to Visit Current Outpatient Prescriptions on File Prior to Visit  Medication Sig Dispense Refill  . amLODipine (NORVASC) 2.5 MG tablet Take 1 tablet (2.5 mg total) by mouth daily. 90 tablet 1  . atorvastatin (LIPITOR) 10 MG tablet Take 1 tablet (10 mg total) by mouth daily at 6 PM. 90 tablet 1  . betamethasone dipropionate (DIPROLENE) 0.05 % cream Apply to affected areas as needed 45 g 1  . furosemide (LASIX) 20 MG tablet TAKE 1 TABLET EVERY DAY AS NEEDED 30 tablet 1  . levETIRAcetam (KEPPRA) 500 MG tablet TAKE 1 TABLET (500 MG TOTAL) BY MOUTH 2 (TWO) TIMES DAILY. 60 tablet 5  . levothyroxine (SYNTHROID, LEVOTHROID) 88 MCG tablet TAKE 1 TABLET EVERY DAY 90 tablet 1  . lisinopril (PRINIVIL,ZESTRIL) 20 MG tablet TAKE 1 TABLET (20 MG TOTAL) BY MOUTH 2 (TWO) TIMES  DAILY. 180 tablet 1  . metoprolol (LOPRESSOR) 50 MG tablet TAKE 2 TABLETS IN AM AND 1 AT BEDTIME 270 tablet 1  . niacin (NIASPAN) 500 MG CR tablet TAKE 1 TABLET (500 MG TOTAL) BY MOUTH AT BEDTIME. 90 tablet 1  . warfarin (COUMADIN) 7.5 MG tablet Take 1 tablet (7.5 mg total) by mouth one time only at 6 PM. 90 tablet 3   No current facility-administered medications on file prior to visit.    Current Medications (verified) Current Outpatient Prescriptions  Medication Sig Dispense Refill  . amLODipine (NORVASC) 2.5 MG tablet Take 1 tablet (2.5 mg total) by mouth daily. 90 tablet 1  . atorvastatin (LIPITOR) 10 MG tablet Take 1 tablet (10 mg total) by mouth daily at 6 PM. 90 tablet 1  . betamethasone dipropionate (DIPROLENE) 0.05 % cream Apply to affected areas as needed 45 g 1  . furosemide (LASIX) 20 MG tablet TAKE 1 TABLET EVERY DAY AS NEEDED 30 tablet 1  . levETIRAcetam (KEPPRA) 500 MG tablet TAKE 1 TABLET (500 MG TOTAL) BY MOUTH 2 (TWO) TIMES DAILY. 60 tablet 5  . levothyroxine (SYNTHROID, LEVOTHROID) 88 MCG tablet TAKE 1 TABLET EVERY DAY 90 tablet 1  . lisinopril (PRINIVIL,ZESTRIL) 20 MG tablet TAKE 1 TABLET (20 MG TOTAL) BY MOUTH 2 (TWO) TIMES DAILY. 180 tablet 1  . metoprolol (LOPRESSOR) 50 MG tablet TAKE 2 TABLETS IN AM AND 1 AT BEDTIME 270 tablet 1  . niacin (NIASPAN) 500 MG CR tablet TAKE 1 TABLET (500 MG TOTAL) BY  MOUTH AT BEDTIME. 90 tablet 1  . warfarin (COUMADIN) 7.5 MG tablet Take 1 tablet (7.5 mg total) by mouth one time only at 6 PM. 90 tablet 3  . mirabegron ER (MYRBETRIQ) 25 MG TB24 tablet Take 1 tablet (25 mg total) by mouth daily. 30 tablet 0   No current facility-administered medications for this visit.     Allergies (verified) Sulfa antibiotics   PAST HISTORY  Family History Family History  Problem Relation Age of Onset  . Atrial fibrillation Mother     pacemaker  . Hypertension Mother   . Heart disease Sister     MI in 51s  . Stroke Sister   . Heart disease  Father   . Heart disease Sister   . Heart disease Sister   . Hypertension Sister   . Anxiety disorder Sister   . Heart disease Brother   . Cancer Brother     pancreatic    Social History History  Substance Use Topics  . Smoking status: Former Smoker -- 1.00 packs/day    Types: Cigarettes    Quit date: 09/30/1978  . Smokeless tobacco: Never Used  . Alcohol Use: 0.6 oz/week    1 Glasses of wine per week     Are there smokers in your home (other than you)? No  Risk Factors Current exercise habits: The patient does not participate in regular exercise at present.    Cardiac risk factors: advanced age (older than 67 for men, 17 for women), dyslipidemia, family history of premature cardiovascular disease, hypertension and obesity (BMI >= 30 kg/m2).  Depression Screen (Note: if answer to either of the following is "Yes", a more complete depression screening is indicated)   Over the past 2 weeks, have you felt down, depressed or hopeless? No  Over the past 2 weeks, have you felt little interest or pleasure in doing things? No  Have you lost interest or pleasure in daily life? No  Do you often feel hopeless? No  Do you cry easily over simple problems? No  Activities of Daily Living In your present state of health, do you have any difficulty performing the following activities?:  Driving? No Managing money?  No Feeding yourself? No Getting from bed to chair? Not usually but sometimes has knee pain - left Climbing a flight of stairs? No Preparing food and eating?: No Bathing or showering? No Getting dressed: No Getting to the toilet? No Using the toilet:No Moving around from place to place: No In the past year have you fallen or had a near fall?: No   Are you sexually active?  No  Do you have more than one partner?  No  Hearing Difficulties: No Do you often ask people to speak up or repeat themselves? No Do you experience ringing or noises in your ears? No Do you have  difficulty understanding soft or whispered voices? No   Do you feel that you have a problem with memory? No  Do you often misplace items? No  Do you feel safe at home?  Yes  Cognitive Testing  Alert? Yes    Normal Appearance?Yes  Oriented to person? Yes    Place? Yes   Time? Yes  Recall of three objects?  Yes   Can perform simple calculations? Yes  Displays appropriate judgment?Yes  Can read the correct time from a watch face?Yes   Advanced Directives have been discussed with the patient? Yes  List the Names of Other Physician/Practitioners you currently use: 1.  Dr Burt Knack and Richardson Dopp at [facility] 2.  Dr Marin Comment - optomologist   Indicate any recent Medical Services you may have received from other than [facility] providers in the past year (date may be approximate). N/A  Immunization History  Administered Date(s) Administered  . Influenza Split 07/03/2012  . Influenza,inj,Quad PF,36+ Mos 09/19/2013, 07/10/2014  . Pneumococcal Conjugate-13 07/10/2014  . Pneumococcal Polysaccharide-23 12/28/2010    Screening Tests Health Maintenance  Topic Date Due  . COLONOSCOPY  03/12/2015 (Originally 10/16/1984)  . ZOSTAVAX  03/12/2015 (Originally 10/16/1994)  . TETANUS/TDAP  03/12/2015 (Originally 10/16/1953)  . MAMMOGRAM  12/03/2014  . INFLUENZA VACCINE  04/13/2015  . DEXA SCAN  Completed  . PNEUMOCOCCAL POLYSACCHARIDE VACCINE AGE 63 AND OVER  Completed    All answers were reviewed with the patient and necessary referrals were made:  Cherre Robins, [facility]   10/10/2014   History reviewed: allergies, current medications, past family history, past medical history, past social history, past surgical history and problem list   Review of Systems - Genito-Urinary ROS: positive for - incontinence and urinary frequency/urgency negative for - dysmenorrhea, dyspareunia, hematuria, nocturia or vulvar/vaginal symptoms  Objective:      Body mass index is 36.54 kg/(m^2). BP 134/80 mmHg  Pulse  82  Ht 5\' 4"  (1.626 m)  Wt 213 lb (96.616 kg)  BMI 36.54 kg/m2  INR was 3.0 today Assessment:    Annual Wellness Visit - Subsequent Therapeutic Anticoagualtion Stress Incontinence.      Plan:     During the course of the visit the patient was educated and counseled about appropriate screening and preventive services including:    Pneumococcal vaccine - UTD  Influenza vaccine - UTD  Shingles vaccine / Zostavax - Patent refuses due to cost  Td vaccine - patient declined  Screening mammography - scheduled today for 12/09/2014  Bone densitometry screening - UTD  Colorectal cancer screening - refuses colonoscopy;  Has FOBT at home - reminded to bring in  Glaucoma screening - Eye exam needed - patient reminded to make appt  Advanced Directives packet given at last visit with PCP -  Patient has not filled out yet.   Start Mybetriq 25mg  1 tablet daily - patient given #14 samples and Rx with coupon for #30 free.   Increase physical activity - suggested Silver sneakers Increase non-starchy vegetables - carrots, green bean, squash, zucchini, tomatoes, onions, peppers, spinach and other green leafy vegetables, cabbage, lettuce, cucumbers, asparagus, okra (not fried), eggplant limit sugar and processed foods (cakes, cookies, ice cream, crackers and chips) Increase fresh fruit but limit serving sizes 1/2 cup or about the size of tennis or baseball  Anticoagulation Dose Instructions as of 10/10/2014      Dorene Grebe Tue Wed Thu Fri Sat   New Dose 3.75 mg 3.75 mg 3.75 mg 3.75 mg 3.75 mg 3.75 mg 3.75 mg    Description        Continue warfarin 1/2 tablet every day.     RTC in 6 weeks to recheck protime/BP/urinary incontinence.  Patient Instructions (the written plan) was given to the patient.  Medicare Attestation I have personally reviewed: The patient's medical and social history Their use of alcohol, tobacco or illicit drugs Their current medications and supplements The  patient's functional ability including ADLs,fall risks, home safety risks, cognitive, and hearing and visual impairment Diet and physical activities Evidence for depression or mood disorders  The patient's weight, height, BMI, and HR/BP have been recorded in the chart.  I have made referrals, counseling, and provided education to the patient based on review of the above and I have provided the patient with a written personalized care plan for preventive services.     Cherre Robins, [facility]   10/10/2014

## 2014-11-13 ENCOUNTER — Ambulatory Visit (INDEPENDENT_AMBULATORY_CARE_PROVIDER_SITE_OTHER): Payer: Medicare Other | Admitting: Pharmacist

## 2014-11-13 DIAGNOSIS — I482 Chronic atrial fibrillation, unspecified: Secondary | ICD-10-CM

## 2014-11-13 LAB — POCT INR: INR: 3.3

## 2014-11-13 NOTE — Patient Instructions (Signed)
Anticoagulation Dose Instructions as of 11/13/2014      Sun Mon Tue Wed Thu Fri Sat   New Dose 3.75 mg 3.75 mg 3.75 mg 3.75 mg Hold 3.75 mg 3.75 mg    Description        No warfarin today - Thursday, March 3rd.  Then decrease dose to no warfarin on thrusdays and  1/2 tablet all other days.     INR was 3.3 today

## 2014-12-04 ENCOUNTER — Ambulatory Visit (INDEPENDENT_AMBULATORY_CARE_PROVIDER_SITE_OTHER): Payer: Medicare Other | Admitting: Pharmacist

## 2014-12-04 DIAGNOSIS — I482 Chronic atrial fibrillation, unspecified: Secondary | ICD-10-CM

## 2014-12-04 LAB — POCT INR: INR: 2.4

## 2014-12-04 MED ORDER — MIRABEGRON ER 50 MG PO TB24
50.0000 mg | ORAL_TABLET | Freq: Every day | ORAL | Status: DC
Start: 2014-12-04 — End: 2015-04-17

## 2014-12-04 NOTE — Patient Instructions (Signed)
Anticoagulation Dose Instructions as of 12/04/2014      Melissa Knight Tue Wed Thu Fri Sat   New Dose 3.75 mg 3.75 mg 3.75 mg 3.75 mg Hold 3.75 mg 3.75 mg    Description        Continue current warfarin 7.5mg  dose of no warfarin on thrusdays and  1/2 tablet all other days.     INR was 2.4 today

## 2015-01-08 ENCOUNTER — Ambulatory Visit (INDEPENDENT_AMBULATORY_CARE_PROVIDER_SITE_OTHER): Payer: Medicare Other | Admitting: Nurse Practitioner

## 2015-01-08 ENCOUNTER — Encounter: Payer: Self-pay | Admitting: Nurse Practitioner

## 2015-01-08 VITALS — BP 126/78 | HR 73 | Temp 97.7°F | Ht 64.0 in | Wt 210.4 lb

## 2015-01-08 DIAGNOSIS — R7309 Other abnormal glucose: Secondary | ICD-10-CM

## 2015-01-08 DIAGNOSIS — E669 Obesity, unspecified: Secondary | ICD-10-CM | POA: Diagnosis not present

## 2015-01-08 DIAGNOSIS — E039 Hypothyroidism, unspecified: Secondary | ICD-10-CM | POA: Diagnosis not present

## 2015-01-08 DIAGNOSIS — E785 Hyperlipidemia, unspecified: Secondary | ICD-10-CM | POA: Diagnosis not present

## 2015-01-08 DIAGNOSIS — R7303 Prediabetes: Secondary | ICD-10-CM

## 2015-01-08 DIAGNOSIS — I482 Chronic atrial fibrillation, unspecified: Secondary | ICD-10-CM

## 2015-01-08 DIAGNOSIS — Z23 Encounter for immunization: Secondary | ICD-10-CM

## 2015-01-08 DIAGNOSIS — I10 Essential (primary) hypertension: Secondary | ICD-10-CM

## 2015-01-08 LAB — POCT INR: INR: 2.6

## 2015-01-08 NOTE — Patient Instructions (Signed)
Bone Health Our bones do many things. They provide structure, protect organs, anchor muscles, and store calcium. Adequate calcium in your diet and weight-bearing physical activity help build strong bones, improve bone amounts, and may reduce the risk of weakening of bones (osteoporosis) later in life. PEAK BONE MASS By age 79, the average woman has acquired most of her skeletal bone mass. A large decline occurs in older adults which increases the risk of osteoporosis. In women this occurs around the time of menopause. It is important for young girls to reach their peak bone mass in order to maintain bone health throughout life. A person with high bone mass as a young adult will be more likely to have a higher bone mass later in life. Not enough calcium consumption and physical activity early on could result in a failure to achieve optimum bone mass in adulthood. OSTEOPOROSIS Osteoporosis is a disease of the bones. It is defined as low bone mass with deterioration of bone structure. Osteoporosis leads to an increase risk of fractures with falls. These fractures commonly happen in the wrist, hip, and spine. While men and women of all ages and background can develop osteoporosis, some of the risk factors for osteoporosis are:  Female.  White.  Postmenopausal.  Older adults.  Small in body size.  Eating a diet low in calcium.  Physically inactive.  Smoking.  Use of some medications.  Family history. CALCIUM Calcium is a mineral needed by the body for healthy bones, teeth, and proper function of the heart, muscles, and nerves. The body cannot produce calcium so it must be absorbed through food. Good sources of calcium include:  Dairy products (low fat or nonfat milk, cheese, and yogurt).  Dark green leafy vegetables (bok choy and broccoli).  Calcium fortified foods (orange juice, cereal, bread, soy beverages, and tofu products).  Nuts (almonds). Recommended amounts of calcium vary  for individuals. RECOMMENDED CALCIUM INTAKES Age and Amount in mg per day  Children 1 to 3 years / 700 mg  Children 4 to 8 years / 1,000 mg  Children 9 to 13 years / 1,300 mg  Teens 14 to 18 years / 1,300 mg  Adults 19 to 50 years / 1,000 mg  Adult women 51 to 70 years / 1,200 mg  Adults 71 years and older / 1,200 mg  Pregnant and breastfeeding teens / 1,300 mg  Pregnant and breastfeeding adults / 1,000 mg Vitamin D also plays an important role in healthy bone development. Vitamin D helps in the absorption of calcium. WEIGHT-BEARING PHYSICAL ACTIVITY Regular physical activity has many positive health benefits. Benefits include strong bones. Weight-bearing physical activity early in life is important in reaching peak bone mass. Weight-bearing physical activities cause muscles and bones to work against gravity. Some examples of weight bearing physical activities include:  Walking, jogging, or running.  Field Hockey.  Jumping rope.  Dancing.  Soccer.  Tennis or Racquetball.  Stair climbing.  Basketball.  Hiking.  Weight lifting.  Aerobic fitness classes. Including weight-bearing physical activity into an exercise plan is a great way to keep bones healthy. Adults: Engage in at least 30 minutes of moderate physical activity on most, preferably all, days of the week. Children: Engage in at least 60 minutes of moderate physical activity on most, preferably all, days of the week. FOR MORE INFORMATION [place] Department of Agriculture, Center for Nutrition Policy and Promotion: www.cnpp.usda.gov National Osteoporosis Foundation: www.nof.org Document Released: 11/19/2003 Document Revised: 12/24/2012 Document Reviewed: 02/18/2009 ExitCare Patient Information   2015 [employer]. This information is not intended to replace advice given to you by your health care provider. Make sure you discuss any questions you have with your health care provider.  

## 2015-01-08 NOTE — Progress Notes (Signed)
Subjective:    Patient ID: Melissa Knight, female    DOB: Jun 18, 1933, 81 y.o.   MRN: [id number]  Patient here today for follow up - no complaints today.  Hyperlipidemia This is a chronic problem. The current episode started more than 1 year ago. The problem is controlled. Exacerbating diseases include hypothyroidism and obesity. She has no history of diabetes. Pertinent negatives include no chest pain, myalgias or shortness of breath. Current antihyperlipidemic treatment includes statins. The current treatment provides moderate improvement of lipids. Compliance problems include adherence to diet and adherence to exercise.  Risk factors for coronary artery disease include dyslipidemia, family history, hypertension, obesity and post-menopausal.  Hypertension This is a chronic problem. The current episode started more than 1 year ago. The problem is controlled. Pertinent negatives include no chest pain, headaches, palpitations or shortness of breath. Risk factors for coronary artery disease include dyslipidemia, obesity and post-menopausal state. Past treatments include diuretics, ACE inhibitors and calcium channel blockers. The current treatment provides moderate improvement. Compliance problems include diet and exercise.  Hypertensive end-organ damage includes a thyroid problem.  Thyroid Problem Patient reports no cold intolerance, constipation, diarrhea, heat intolerance, palpitations or visual change. Her past medical history is significant for hyperlipidemia. There is no history of diabetes.  Seizures  Pertinent negatives include no headaches, no chest pain and no diarrhea.  atrial fib Coumadin level to be checked today   Review of Systems  Constitutional: Negative.   HENT: Negative.   Respiratory: Negative for shortness of breath.   Cardiovascular: Negative for chest pain and palpitations.  Gastrointestinal: Negative for diarrhea and constipation.  Endocrine: Negative for cold intolerance and  heat intolerance.  Genitourinary: Negative.   Musculoskeletal: Negative for myalgias.  Neurological: Positive for seizures. Negative for headaches.  Psychiatric/Behavioral: Negative.   All other systems reviewed and are negative.      Objective:   Physical Exam  Constitutional: She is oriented to person, place, and time. She appears well-developed and well-nourished.  HENT:  Nose: Nose normal.  Mouth/Throat: Oropharynx is clear and moist.  Eyes: EOM are normal.  Neck: Trachea normal, normal range of motion and full passive range of motion without pain. Neck supple. No JVD present. Carotid bruit is not present. No thyromegaly present.  Cardiovascular: Normal rate, regular rhythm, normal heart sounds and intact distal pulses.  Exam reveals no gallop and no friction rub.   No murmur heard. Pulmonary/Chest: Effort normal and breath sounds normal.  Abdominal: Soft. Bowel sounds are normal. She exhibits no distension and no mass. There is no tenderness.  Musculoskeletal: Normal range of motion.  Lymphadenopathy:    She has no cervical adenopathy.  Neurological: She is alert and oriented to person, place, and time. She has normal reflexes.  Skin: Skin is warm and dry.  Psychiatric: She has a normal mood and affect. Her behavior is normal. Judgment and thought content normal.    BP 126/78 mmHg  Pulse 73  Temp(Src) 97.7 F (36.5 C) (Oral)  Ht 5' 4" (1.626 m)  Wt 210 lb 6.4 oz (95.437 kg)  BMI 36.10 kg/m2        Assessment & Plan:  1. Chronic atrial fibrillation Avoid caffeine - POCT INR  2. HTN (hypertension), malignant Do not add salt to diet - CMP14+EGFR   3. Hypothyroidism (acquired) - Thyroid Panel With TSH  4. Pre-diabetes Watch carbs in diet  5. Obesity (BMI 30-39.9) Discussed diet and exercise for person with BMI >25 Will recheck weight in 3-6 months  6. Hyperlipidemia Low fat diet - NMR, lipoprofile   zostavax today Labs pending Health maintenance  reviewed Diet and exercise encouraged Continue all meds Follow up  In 3 months   [place], FNP

## 2015-01-09 LAB — THYROID PANEL WITH TSH
Free Thyroxine Index: 3.3 (ref 1.2–4.9)
T3 Uptake Ratio: 30 % (ref 24–39)
T4 TOTAL: 10.9 ug/dL (ref 4.5–12.0)
TSH: 1.57 u[IU]/mL (ref 0.450–4.500)

## 2015-01-09 LAB — CMP14+EGFR
A/G RATIO: 2.1 (ref 1.1–2.5)
ALT: 16 IU/L (ref 0–32)
AST: 22 IU/L (ref 0–40)
Albumin: 4.1 g/dL (ref 3.5–4.7)
Alkaline Phosphatase: 54 IU/L (ref 39–117)
BUN/Creatinine Ratio: 18 (ref 11–26)
BUN: 13 mg/dL (ref 8–27)
Bilirubin Total: 0.6 mg/dL (ref 0.0–1.2)
CO2: 23 mmol/L (ref 18–29)
Calcium: 8.9 mg/dL (ref 8.7–10.3)
Chloride: 104 mmol/L (ref 97–108)
Creatinine, Ser: 0.73 mg/dL (ref 0.57–1.00)
GFR calc non Af Amer: 78 mL/min/{1.73_m2} (ref >59)
GFR, EST AFRICAN AMERICAN: 90 mL/min/{1.73_m2} (ref >59)
GLUCOSE: 90 mg/dL (ref 65–99)
Globulin, Total: 2 g/dL (ref 1.5–4.5)
Potassium: 4.5 mmol/L (ref 3.5–5.2)
Sodium: 142 mmol/L (ref 134–144)
Total Protein: 6.1 g/dL (ref 6.0–8.5)

## 2015-01-09 LAB — NMR, LIPOPROFILE
Cholesterol: 181 mg/dL (ref 100–199)
HDL CHOLESTEROL BY NMR: 64 mg/dL (ref >39)
HDL Particle Number: 38.6 umol/L (ref >=30.5)
LDL PARTICLE NUMBER: 792 nmol/L (ref <1000)
LDL Size: 22.1 nm (ref >20.5)
LDL-C: 89 mg/dL (ref 0–99)
LP-IR SCORE: 36 (ref <=45)
Small LDL Particle Number: <90 nmol/L (ref <=527)
Triglycerides by NMR: 142 mg/dL (ref 0–149)

## 2015-02-03 ENCOUNTER — Other Ambulatory Visit: Payer: Self-pay | Admitting: Nurse Practitioner

## 2015-02-05 ENCOUNTER — Other Ambulatory Visit: Payer: Self-pay | Admitting: Nurse Practitioner

## 2015-02-08 ENCOUNTER — Other Ambulatory Visit: Payer: Self-pay | Admitting: Nurse Practitioner

## 2015-02-12 ENCOUNTER — Ambulatory Visit (INDEPENDENT_AMBULATORY_CARE_PROVIDER_SITE_OTHER): Payer: Medicare Other | Admitting: Pharmacist

## 2015-02-12 DIAGNOSIS — I482 Chronic atrial fibrillation, unspecified: Secondary | ICD-10-CM

## 2015-02-12 LAB — POCT INR: INR: 2.6

## 2015-02-12 NOTE — Patient Instructions (Signed)
Anticoagulation Dose Instructions as of 02/12/2015      Dorene Grebe Tue Wed Thu Fri Sat   New Dose 3.75 mg 3.75 mg 3.75 mg 3.75 mg Hold 3.75 mg 3.75 mg    Description        Continue current dose holding med on thursday     INR was 2.6 today

## 2015-03-10 ENCOUNTER — Other Ambulatory Visit: Payer: Self-pay | Admitting: Nurse Practitioner

## 2015-03-18 ENCOUNTER — Other Ambulatory Visit: Payer: Self-pay | Admitting: Nurse Practitioner

## 2015-03-19 ENCOUNTER — Ambulatory Visit (INDEPENDENT_AMBULATORY_CARE_PROVIDER_SITE_OTHER): Payer: Medicare Other | Admitting: Pharmacist

## 2015-03-19 DIAGNOSIS — I482 Chronic atrial fibrillation, unspecified: Secondary | ICD-10-CM

## 2015-03-19 LAB — POCT INR: INR: 2.1

## 2015-03-19 NOTE — Patient Instructions (Signed)
Stop Niacin  Anticoagulation Dose Instructions as of 03/19/2015      Dorene Grebe Tue Wed Thu Fri Sat   New Dose 3.75 mg 3.75 mg 3.75 mg 3.75 mg Hold 3.75 mg 3.75 mg    Description        Continue current dose of warfarin 7.5mg  - take 1/2 tablet daily except on Thursday do not take any warfarin.     INR was 2.1 today

## 2015-04-17 ENCOUNTER — Ambulatory Visit (INDEPENDENT_AMBULATORY_CARE_PROVIDER_SITE_OTHER): Payer: Medicare Other | Admitting: Nurse Practitioner

## 2015-04-17 ENCOUNTER — Encounter: Payer: Self-pay | Admitting: Nurse Practitioner

## 2015-04-17 VITALS — BP 136/82 | HR 63 | Temp 97.1°F | Ht 64.0 in | Wt 212.0 lb

## 2015-04-17 DIAGNOSIS — E039 Hypothyroidism, unspecified: Secondary | ICD-10-CM | POA: Diagnosis not present

## 2015-04-17 DIAGNOSIS — Z6836 Body mass index (BMI) 36.0-36.9, adult: Secondary | ICD-10-CM | POA: Diagnosis not present

## 2015-04-17 DIAGNOSIS — E785 Hyperlipidemia, unspecified: Secondary | ICD-10-CM

## 2015-04-17 DIAGNOSIS — I482 Chronic atrial fibrillation, unspecified: Secondary | ICD-10-CM

## 2015-04-17 DIAGNOSIS — I638 Other cerebral infarction: Secondary | ICD-10-CM | POA: Diagnosis not present

## 2015-04-17 DIAGNOSIS — G40319 Generalized idiopathic epilepsy and epileptic syndromes, intractable, without status epilepticus: Secondary | ICD-10-CM

## 2015-04-17 DIAGNOSIS — G40311 Generalized idiopathic epilepsy and epileptic syndromes, intractable, with status epilepticus: Secondary | ICD-10-CM | POA: Diagnosis not present

## 2015-04-17 DIAGNOSIS — E875 Hyperkalemia: Secondary | ICD-10-CM

## 2015-04-17 DIAGNOSIS — E8881 Metabolic syndrome: Secondary | ICD-10-CM

## 2015-04-17 DIAGNOSIS — I6389 Other cerebral infarction: Secondary | ICD-10-CM

## 2015-04-17 DIAGNOSIS — I10 Essential (primary) hypertension: Secondary | ICD-10-CM

## 2015-04-17 DIAGNOSIS — E669 Obesity, unspecified: Secondary | ICD-10-CM | POA: Diagnosis not present

## 2015-04-17 LAB — POCT INR: INR: 1.9

## 2015-04-17 MED ORDER — LEVOTHYROXINE SODIUM 88 MCG PO TABS: 88.0000 ug | ORAL_TABLET | Freq: Every day | ORAL | Status: DC

## 2015-04-17 MED ORDER — LISINOPRIL 20 MG PO TABS: ORAL_TABLET | ORAL | Status: DC

## 2015-04-17 MED ORDER — METOPROLOL TARTRATE 50 MG PO TABS: ORAL_TABLET | ORAL | Status: DC

## 2015-04-17 MED ORDER — AMLODIPINE BESYLATE 2.5 MG PO TABS: 2.5000 mg | ORAL_TABLET | Freq: Every day | ORAL | Status: DC

## 2015-04-17 MED ORDER — WARFARIN SODIUM 7.5 MG PO TABS: 7.5000 mg | ORAL_TABLET | Freq: Once | ORAL | Status: DC

## 2015-04-17 MED ORDER — ATORVASTATIN CALCIUM 10 MG PO TABS: 10.0000 mg | ORAL_TABLET | Freq: Every day | ORAL | Status: DC

## 2015-04-17 MED ORDER — LEVETIRACETAM 500 MG PO TABS: ORAL_TABLET | ORAL | Status: DC

## 2015-04-17 NOTE — Patient Instructions (Signed)
Bone Health Our bones do many things. They provide structure, protect organs, anchor muscles, and store calcium. Adequate calcium in your diet and weight-bearing physical activity help build strong bones, improve bone amounts, and may reduce the risk of weakening of bones (osteoporosis) later in life. PEAK BONE MASS By age 79, the average woman has acquired most of her skeletal bone mass. A large decline occurs in older adults which increases the risk of osteoporosis. In women this occurs around the time of menopause. It is important for young girls to reach their peak bone mass in order to maintain bone health throughout life. A person with high bone mass as a young adult will be more likely to have a higher bone mass later in life. Not enough calcium consumption and physical activity early on could result in a failure to achieve optimum bone mass in adulthood. OSTEOPOROSIS Osteoporosis is a disease of the bones. It is defined as low bone mass with deterioration of bone structure. Osteoporosis leads to an increase risk of fractures with falls. These fractures commonly happen in the wrist, hip, and spine. While men and women of all ages and background can develop osteoporosis, some of the risk factors for osteoporosis are:  Female.  White.  Postmenopausal.  Older adults.  Small in body size.  Eating a diet low in calcium.  Physically inactive.  Smoking.  Use of some medications.  Family history. CALCIUM Calcium is a mineral needed by the body for healthy bones, teeth, and proper function of the heart, muscles, and nerves. The body cannot produce calcium so it must be absorbed through food. Good sources of calcium include:  Dairy products (low fat or nonfat milk, cheese, and yogurt).  Dark green leafy vegetables (bok choy and broccoli).  Calcium fortified foods (orange juice, cereal, bread, soy beverages, and tofu products).  Nuts (almonds). Recommended amounts of calcium vary  for individuals. RECOMMENDED CALCIUM INTAKES Age and Amount in mg per day  Children 1 to 3 years / 700 mg  Children 4 to 8 years / 1,000 mg  Children 9 to 13 years / 1,300 mg  Teens 14 to 18 years / 1,300 mg  Adults 19 to 50 years / 1,000 mg  Adult women 51 to 70 years / 1,200 mg  Adults 71 years and older / 1,200 mg  Pregnant and breastfeeding teens / 1,300 mg  Pregnant and breastfeeding adults / 1,000 mg Vitamin D also plays an important role in healthy bone development. Vitamin D helps in the absorption of calcium. WEIGHT-BEARING PHYSICAL ACTIVITY Regular physical activity has many positive health benefits. Benefits include strong bones. Weight-bearing physical activity early in life is important in reaching peak bone mass. Weight-bearing physical activities cause muscles and bones to work against gravity. Some examples of weight bearing physical activities include:  Walking, jogging, or running.  Field Hockey.  Jumping rope.  Dancing.  Soccer.  Tennis or Racquetball.  Stair climbing.  Basketball.  Hiking.  Weight lifting.  Aerobic fitness classes. Including weight-bearing physical activity into an exercise plan is a great way to keep bones healthy. Adults: Engage in at least 30 minutes of moderate physical activity on most, preferably all, days of the week. Children: Engage in at least 60 minutes of moderate physical activity on most, preferably all, days of the week. FOR MORE INFORMATION [place] Department of Agriculture, Center for Nutrition Policy and Promotion: www.cnpp.usda.gov National Osteoporosis Foundation: www.nof.org Document Released: 11/19/2003 Document Revised: 12/24/2012 Document Reviewed: 02/18/2009 ExitCare Patient Information   2015 [employer]. This information is not intended to replace advice given to you by your health care provider. Make sure you discuss any questions you have with your health care provider.  

## 2015-04-17 NOTE — Progress Notes (Signed)
Subjective:    Patient ID: Melissa Knight, female    DOB: May 27, 1933, 81 y.o.   MRN: [id number]  Patient here today for follow up - no complaints today.  Hyperlipidemia This is a chronic problem. The current episode started more than 1 year ago. The problem is controlled. Exacerbating diseases include hypothyroidism and obesity. She has no history of diabetes. Pertinent negatives include no chest pain, myalgias or shortness of breath. Current antihyperlipidemic treatment includes statins. The current treatment provides moderate improvement of lipids. Compliance problems include adherence to diet and adherence to exercise.  Risk factors for coronary artery disease include dyslipidemia, family history, hypertension, obesity and post-menopausal.  Hypertension This is a chronic problem. The current episode started more than 1 year ago. The problem is controlled. Pertinent negatives include no chest pain, headaches, palpitations or shortness of breath. Risk factors for coronary artery disease include dyslipidemia, obesity and post-menopausal state. Past treatments include diuretics, ACE inhibitors and calcium channel blockers. The current treatment provides moderate improvement. Compliance problems include diet and exercise.  Hypertensive end-organ damage includes a thyroid problem.  Thyroid Problem Patient reports no cold intolerance, constipation, diarrhea, heat intolerance, palpitations or visual change. Her past medical history is significant for hyperlipidemia. There is no history of diabetes.  Seizures  Pertinent negatives include no headaches, no chest pain and no diarrhea.  atrial fib Coumadin level to be checked today CVA hx 2 years ago- no residual effects Metabolic syndrome Patient does not check blood sugars at home. tries to watch carbs in diet- no exercise Stress incontinence Has slight leak most of the time- wears pads- does not bother her that much.    Review of Systems   Respiratory: Negative for shortness of breath.   Cardiovascular: Negative for chest pain and palpitations.  Gastrointestinal: Negative for diarrhea and constipation.  Endocrine: Negative for cold intolerance and heat intolerance.  Musculoskeletal: Negative for myalgias.  Neurological: Positive for seizures. Negative for headaches.  All other systems reviewed and are negative.      Objective:   Physical Exam  Constitutional: She is oriented to person, place, and time. She appears well-developed and well-nourished.  HENT:  Nose: Nose normal.  Mouth/Throat: Oropharynx is clear and moist.  Eyes: EOM are normal.  Neck: Trachea normal, normal range of motion and full passive range of motion without pain. Neck supple. No JVD present. Carotid bruit is not present. No thyromegaly present.  Cardiovascular: Normal rate, regular rhythm, normal heart sounds and intact distal pulses.  Exam reveals no gallop and no friction rub.   No murmur heard. Pulmonary/Chest: Effort normal and breath sounds normal.  Abdominal: Soft. Bowel sounds are normal. She exhibits no distension and no mass. There is no tenderness.  Musculoskeletal: Normal range of motion.  Lymphadenopathy:    She has no cervical adenopathy.  Neurological: She is alert and oriented to person, place, and time. She has normal reflexes.  Skin: Skin is warm and dry.  Psychiatric: She has a normal mood and affect. Her behavior is normal. Judgment and thought content normal.    BP 136/82 mmHg  Pulse 63  Temp(Src) 97.1 F (36.2 C) (Oral)  Ht 5' 4"  (1.626 m)  Wt 212 lb (96.163 kg)  BMI 36.37 kg/m2  Results for orders placed or performed in visit on 04/17/15  POCT INR  Result Value Ref Range   INR 1.9           Assessment & Plan:   1. Chronic atrial fibrillation Continue coumadin as  rx - POCT INR - warfarin (COUMADIN) 7.5 MG tablet; Take 1 tablet (7.5 mg total) by mouth one time only at 6 PM.  Dispense: 90 tablet; Refill:  3  2. HTN (hypertension), malignant Do not add salt o diet - lisinopril (PRINIVIL,ZESTRIL) 20 MG tablet; TAKE 1 TABLET (20 MG TOTAL) BY MOUTH 2 (TWO) TIMES DAILY.  Dispense: 180 tablet; Refill: 1 - metoprolol (LOPRESSOR) 50 MG tablet; TAKE 2 TABLETS IN AM AND 1 AT BEDTIME  Dispense: 270 tablet; Refill: 1 - amLODipine (NORVASC) 2.5 MG tablet; Take 1 tablet (2.5 mg total) by mouth daily.  Dispense: 90 tablet; Refill: 1 - CMP14+EGFR  3. Atrial fibrillation, chronic Continue coumadin  4. Hypothyroidism (acquired) - levothyroxine (SYNTHROID, LEVOTHROID) 88 MCG tablet; Take 1 tablet (88 mcg total) by mouth daily.  Dispense: 90 tablet; Refill: 1 - Thyroid Panel With TSH  5. Metabolic syndrome Watch carbs in diet  6. Hyperlipidemia Low fat diet - atorvastatin (LIPITOR) 10 MG tablet; Take 1 tablet (10 mg total) by mouth daily at 6 PM.  Dispense: 90 tablet; Refill: 1 - Lipid panel  7. Obesity (BMI 30-39.9) Discussed diet and exercise for person with BMI >25 Will recheck weight in 3-6 months  8. Seizure disorder, generalized convulsive, intractable - levETIRAcetam (KEPPRA) 500 MG tablet; TAKE 1 TABLET (500 MG TOTAL) BY MOUTH 2 (TWO) TIMES DAILY.  Dispense: 60 tablet; Refill: 5  9. 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 BMI 36.0-36.9,adult    Labs pending Health maintenance reviewed Diet and exercise encouraged Continue all meds Follow up  In 3 months   [place], FNP

## 2015-04-18 ENCOUNTER — Other Ambulatory Visit: Payer: Self-pay | Admitting: Nurse Practitioner

## 2015-04-18 LAB — THYROID PANEL WITH TSH
Free Thyroxine Index: 3.7 (ref 1.2–4.9)
T3 UPTAKE RATIO: 29 % (ref 24–39)
T4 TOTAL: 12.6 ug/dL — AB (ref 4.5–12.0)
TSH: 1.65 u[IU]/mL (ref 0.450–4.500)

## 2015-04-18 LAB — CMP14+EGFR
A/G RATIO: 1.7 (ref 1.1–2.5)
ALT: 16 IU/L (ref 0–32)
AST: 21 IU/L (ref 0–40)
Albumin: 3.9 g/dL (ref 3.5–4.7)
Alkaline Phosphatase: 59 IU/L (ref 39–117)
BUN/Creatinine Ratio: 13 (ref 11–26)
BUN: 10 mg/dL (ref 8–27)
Bilirubin Total: 0.7 mg/dL (ref 0.0–1.2)
CHLORIDE: 101 mmol/L (ref 97–108)
CO2: 26 mmol/L (ref 18–29)
Calcium: 9.3 mg/dL (ref 8.7–10.3)
Creatinine, Ser: 0.8 mg/dL (ref 0.57–1.00)
GFR calc non Af Amer: 70 mL/min/{1.73_m2} (ref >59)
GFR, EST AFRICAN AMERICAN: 81 mL/min/{1.73_m2} (ref >59)
Globulin, Total: 2.3 g/dL (ref 1.5–4.5)
Glucose: 91 mg/dL (ref 65–99)
Potassium: 5.4 mmol/L — ABNORMAL HIGH (ref 3.5–5.2)
SODIUM: 143 mmol/L (ref 134–144)
Total Protein: 6.2 g/dL (ref 6.0–8.5)

## 2015-04-18 LAB — LIPID PANEL
CHOLESTEROL TOTAL: 193 mg/dL (ref 100–199)
Chol/HDL Ratio: 3.3 ratio units (ref 0.0–4.4)
HDL: 59 mg/dL (ref >39)
LDL CALC: 106 mg/dL — AB (ref 0–99)
Triglycerides: 140 mg/dL (ref 0–149)
VLDL CHOLESTEROL CAL: 28 mg/dL (ref 5–40)

## 2015-04-23 ENCOUNTER — Other Ambulatory Visit: Payer: Self-pay | Admitting: Nurse Practitioner

## 2015-04-23 NOTE — Addendum Note (Signed)
Addended by: Rolena Infante on: 04/23/2015 02:17 PM   Modules accepted: Orders

## 2015-04-24 ENCOUNTER — Other Ambulatory Visit (INDEPENDENT_AMBULATORY_CARE_PROVIDER_SITE_OTHER): Payer: Medicare Other

## 2015-04-24 DIAGNOSIS — E875 Hyperkalemia: Secondary | ICD-10-CM

## 2015-04-25 ENCOUNTER — Other Ambulatory Visit: Payer: Self-pay | Admitting: Nurse Practitioner

## 2015-04-25 LAB — CMP14+EGFR
ALBUMIN: 4 g/dL (ref 3.5–4.7)
ALK PHOS: 57 IU/L (ref 39–117)
ALT: 18 IU/L (ref 0–32)
AST: 21 IU/L (ref 0–40)
Albumin/Globulin Ratio: 1.9 (ref 1.1–2.5)
BILIRUBIN TOTAL: 0.4 mg/dL (ref 0.0–1.2)
BUN/Creatinine Ratio: 14 (ref 11–26)
BUN: 11 mg/dL (ref 8–27)
CO2: 23 mmol/L (ref 18–29)
CREATININE: 0.78 mg/dL (ref 0.57–1.00)
Calcium: 9.1 mg/dL (ref 8.7–10.3)
Chloride: 103 mmol/L (ref 97–108)
GFR calc Af Amer: 83 mL/min/{1.73_m2} (ref >59)
GFR, EST NON AFRICAN AMERICAN: 72 mL/min/{1.73_m2} (ref >59)
Globulin, Total: 2.1 g/dL (ref 1.5–4.5)
Glucose: 97 mg/dL (ref 65–99)
Potassium: 4.4 mmol/L (ref 3.5–5.2)
SODIUM: 141 mmol/L (ref 134–144)
TOTAL PROTEIN: 6.1 g/dL (ref 6.0–8.5)

## 2015-05-01 ENCOUNTER — Ambulatory Visit (INDEPENDENT_AMBULATORY_CARE_PROVIDER_SITE_OTHER): Payer: Medicare Other | Admitting: Pharmacist

## 2015-05-01 DIAGNOSIS — I482 Chronic atrial fibrillation, unspecified: Secondary | ICD-10-CM

## 2015-05-01 LAB — POCT INR: INR: 1.6

## 2015-05-01 NOTE — Patient Instructions (Signed)
Anticoagulation Dose Instructions as of 05/01/2015      Melissa Knight Tue Wed Thu Fri Sat   New Dose 3.75 mg 3.75 mg 3.75 mg 3.75 mg 3.75 mg 3.75 mg 3.75 mg    Description        Take whole tablet today - Friday, August 19th then increase warfarin dose to 7.5mg  tablets take 1/ 2 tablet each day.     INR was 1.6 today

## 2015-05-21 ENCOUNTER — Ambulatory Visit (INDEPENDENT_AMBULATORY_CARE_PROVIDER_SITE_OTHER): Payer: Medicare Other | Admitting: Pharmacist

## 2015-05-21 DIAGNOSIS — I482 Chronic atrial fibrillation, unspecified: Secondary | ICD-10-CM

## 2015-05-21 LAB — POCT INR: INR: 1.9

## 2015-05-21 NOTE — Patient Instructions (Signed)
Anticoagulation Dose Instructions as of 05/21/2015      Sun Mon Tue Wed Thu Fri Sat   New Dose 3.75 mg 3.75 mg 3.75 mg 3.75 mg 3.75 mg 3.75 mg 3.75 mg    Description        Take whole tablet today - Thursday, September 8th -  then continue current warfarin dose of 7.5mg  tablets take 1/ 2 tablet each day.     INR was 1.9 today

## 2015-06-25 ENCOUNTER — Ambulatory Visit (INDEPENDENT_AMBULATORY_CARE_PROVIDER_SITE_OTHER): Payer: Medicare Other | Admitting: Pharmacist

## 2015-06-25 DIAGNOSIS — I482 Chronic atrial fibrillation, unspecified: Secondary | ICD-10-CM

## 2015-06-25 DIAGNOSIS — Z23 Encounter for immunization: Secondary | ICD-10-CM | POA: Diagnosis not present

## 2015-06-25 LAB — POCT INR: INR: 2.2

## 2015-06-25 NOTE — Patient Instructions (Signed)
Anticoagulation Dose Instructions as of 06/25/2015      Melissa Knight Tue Wed Thu Fri Sat   New Dose 3.75 mg 3.75 mg 3.75 mg 3.75 mg 3.75 mg 3.75 mg 3.75 mg    Description        Continue current warfarin dose of 7.5mg  tablets take 1/ 2 tablet each day.     INR was 2.2 today

## 2015-06-25 NOTE — Progress Notes (Signed)
Cardiology Office Note   Date:  06/26/2015   ID:  Melissa Knight, DOB [birth year], MRN [id number]  PCP:  Chevis Pretty, FNP  Cardiologist:  Dr. Sherren Mocha   Electrophysiologist:  n/a  Chief Complaint  Patient presents with  . Follow-up  . Atrial Fibrillation     History of Present Illness: Melissa Knight is a 81 y.o. female with a hx of permanent AFib, HTN and hypothyroidism. She was admitted to the hospital 06/2012 with a right brain stroke in the setting of atrial fibrillation which was new. It was felt that her stroke was likely cardioembolic. She was anticoagulated with Xarelto but transitioned to warfarin due to cost. Of note, carotid Dopplers were negative for ICA stenosis. Echocardiogram 06/30/12: EF 60-65%, mild MR, PASP 33. Last seen by Dr. Sherren Mocha in 7/15.  She returns for FU.    She is doing well.  Energy level is ok.  She is limited by knee DJD.  She denies chest pain.  She denies significant DOE.  She denies orthopnea, PND, edema.  She denies syncope. She denies any bleeding issues.     Studies/Reports Reviewed Today:  Echo 10/13 EF 60-65%, mild MR, PASP 33 mmHg    Past Medical History  Diagnosis Date  . Hypertension   . Hypothyroidism   . Atrial fibrillation ([place]) 06/2012    Echocardiogram 06/30/12: EF 60-65%, mild MR, PASP 33.  Marland Kitchen Acute ischemic stroke ([place]) 06/2012    acute right parietal ischemic stroke - likely cardioembolic in setting of AFib => coumadin started and followed by PCP  . Seizure ([place]) 06/2012    in the setting of acute ischemic stroke  . Lichen sclerosus et atrophicus of the vulva   . Hyperlipidemia     Past Surgical History  Procedure Laterality Date  . Bladder surgery      bladder prolapse  . Tubal ligation       Current Outpatient Prescriptions  Medication Sig Dispense Refill  . amLODipine (NORVASC) 2.5 MG tablet Take 1 tablet (2.5 mg total) by mouth daily. 90 tablet 1  . atorvastatin (LIPITOR) 10 MG tablet TAKE  1 TABLET (10 MG TOTAL) BY MOUTH DAILY AT 6 PM. 90 tablet 1  . betamethasone dipropionate (DIPROLENE) 0.05 % cream Apply to affected areas as needed 45 g 1  . furosemide (LASIX) 20 MG tablet TAKE 1 TABLET EVERY DAY AS NEEDED 30 tablet 1  . levETIRAcetam (KEPPRA) 500 MG tablet TAKE 1 TABLET (500 MG TOTAL) BY MOUTH 2 (TWO) TIMES DAILY. 60 tablet 5  . levothyroxine (SYNTHROID, LEVOTHROID) 88 MCG tablet Take 1 tablet (88 mcg total) by mouth daily. 90 tablet 1  . lisinopril (PRINIVIL,ZESTRIL) 20 MG tablet TAKE 1 TABLET (20 MG TOTAL) BY MOUTH 2 (TWO) TIMES DAILY. 180 tablet 1  . metoprolol (LOPRESSOR) 50 MG tablet TAKE 2 TABLETS IN AM AND 1 AT BEDTIME 270 tablet 1  . warfarin (COUMADIN) 7.5 MG tablet Take 1 tablet (7.5 mg total) by mouth one time only at 6 PM. 90 tablet 3   No current facility-administered medications for this visit.    Allergies:   Sulfa antibiotics    Social History:  The patient  reports that she quit smoking about 36 years ago. Her smoking use included Cigarettes. She smoked 1.00 pack per day. She has never used smokeless tobacco. She reports that she drinks about 0.6 oz of alcohol per week. She reports that she does not use illicit drugs.   Family History:  The  patient's family history includes Anxiety disorder in her sister; Atrial fibrillation in her mother; Cancer in her brother; Heart disease in her brother, father, sister, sister, and sister; Hypertension in her mother and sister; Stroke in her sister.    ROS:   Please see the history of present illness.   Review of Systems  All other systems reviewed and are negative.     PHYSICAL EXAM: VS:  BP 140/78 mmHg  Pulse 64  Ht 5\' 3"  (1.6 m)  Wt 212 lb 6.4 oz (96.344 kg)  BMI 37.63 kg/m2    Wt Readings from Last 3 Encounters:  06/26/15 212 lb 6.4 oz (96.344 kg)  04/17/15 212 lb (96.163 kg)  01/08/15 210 lb 6.4 oz (95.437 kg)     GEN: Well nourished, well developed, in no acute distress HEENT: normal Neck: no  JVD, no carotid bruits, no masses Cardiac:  Normal S1/S2, irregularly irregular rhythm; no murmur ,  no rubs or gallops, no edema   Respiratory:  clear to auscultation bilaterally, no wheezing, rhonchi or rales. GI: soft, nontender, nondistended, + BS MS: no deformity or atrophy Skin: warm and dry  Neuro:  CNs II-XII intact, Strength and sensation are intact Psych: Normal affect   EKG:  EKG is ordered today.  It demonstrates:   AFib, HR 64, no change from prior tracing   Recent Labs: 04/17/2015: TSH 1.650 04/24/2015: ALT 18; BUN 11; Creatinine, Ser 0.78; Potassium 4.4; Sodium 141    Lipid Panel    Component Value Date/Time   CHOL 193 04/17/2015 1103   CHOL 181 01/08/2015 1124   CHOL 181 01/30/2013 1117   TRIG 140 04/17/2015 1103   TRIG 142 01/08/2015 1124   TRIG 133 01/30/2013 1117   HDL 59 04/17/2015 1103   HDL 64 01/08/2015 1124   HDL 53 01/30/2013 1117   HDL 36 10/31/2012   CHOLHDL 3.3 04/17/2015 1103   CHOLHDL 4.4 07/01/2012 0532   VLDL 35 07/01/2012 0532   LDLCALC 106* 04/17/2015 1103   LDLCALC 92 06/05/2014 1104   LDLCALC 101* 01/30/2013 1117   LDLCALC 109 10/31/2012      ASSESSMENT AND PLAN:  1. Chronic Atrial Fibrillation:  Rate is controlled. Her coumadin is managed by her PCP.  She is tolerating it well. Continue current therapy.  2. HTN:  Fair control.  Continue current Rx.   3. Hyperlipidemia:   Continue statin. This is managed by PCP.      Medication Changes: Current medicines are reviewed at length with the patient today.  Concerns regarding medicines are as outlined above.  The following changes have been made:   Discontinued Medications   No medications on file   Modified Medications   No medications on file   New Prescriptions   No medications on file   Labs/ tests ordered today include:   Orders Placed This Encounter  Procedures  . EKG 12-Lead      Disposition:    FU with Dr. Sherren Mocha 1 year.     Signed, Versie Starks, MHS 06/26/2015 12:11 PM    [facility] [address], [place], [place]  [address] Phone: [phone]; Fax: [phone]

## 2015-06-26 ENCOUNTER — Ambulatory Visit (INDEPENDENT_AMBULATORY_CARE_PROVIDER_SITE_OTHER): Payer: Medicare Other | Admitting: Physician Assistant

## 2015-06-26 ENCOUNTER — Encounter: Payer: Self-pay | Admitting: Physician Assistant

## 2015-06-26 VITALS — BP 140/78 | HR 64 | Ht 63.0 in | Wt 212.4 lb

## 2015-06-26 DIAGNOSIS — I482 Chronic atrial fibrillation, unspecified: Secondary | ICD-10-CM

## 2015-06-26 DIAGNOSIS — I10 Essential (primary) hypertension: Secondary | ICD-10-CM | POA: Diagnosis not present

## 2015-06-26 DIAGNOSIS — E785 Hyperlipidemia, unspecified: Secondary | ICD-10-CM

## 2015-06-26 NOTE — Patient Instructions (Signed)
Medication Instructions:  Your physician recommends that you continue on your current medications as directed. Please refer to the Current Medication list given to you today.   Labwork: NONE  Testing/Procedures: NONE  Follow-Up: Your physician wants you to follow-up in: [place] DR. Burt Knack You will receive a reminder letter in the mail two months in advance. If you don't receive a letter, please call our office to schedule the follow-up appointment.   Any Other Special Instructions Will Be Listed Below (If Applicable).

## 2015-07-20 ENCOUNTER — Other Ambulatory Visit: Payer: Self-pay | Admitting: Nurse Practitioner

## 2015-07-30 ENCOUNTER — Ambulatory Visit (INDEPENDENT_AMBULATORY_CARE_PROVIDER_SITE_OTHER): Payer: Medicare Other | Admitting: Nurse Practitioner

## 2015-07-30 ENCOUNTER — Encounter (INDEPENDENT_AMBULATORY_CARE_PROVIDER_SITE_OTHER): Payer: Self-pay

## 2015-07-30 ENCOUNTER — Encounter: Payer: Self-pay | Admitting: Nurse Practitioner

## 2015-07-30 VITALS — BP 130/88 | HR 62 | Temp 96.9°F | Ht 63.0 in | Wt 214.0 lb

## 2015-07-30 DIAGNOSIS — I482 Chronic atrial fibrillation, unspecified: Secondary | ICD-10-CM

## 2015-07-30 DIAGNOSIS — E785 Hyperlipidemia, unspecified: Secondary | ICD-10-CM

## 2015-07-30 DIAGNOSIS — E039 Hypothyroidism, unspecified: Secondary | ICD-10-CM | POA: Diagnosis not present

## 2015-07-30 DIAGNOSIS — E8881 Metabolic syndrome: Secondary | ICD-10-CM

## 2015-07-30 DIAGNOSIS — E669 Obesity, unspecified: Secondary | ICD-10-CM | POA: Diagnosis not present

## 2015-07-30 DIAGNOSIS — Z1159 Encounter for screening for other viral diseases: Secondary | ICD-10-CM

## 2015-07-30 DIAGNOSIS — I638 Other cerebral infarction: Secondary | ICD-10-CM | POA: Diagnosis not present

## 2015-07-30 DIAGNOSIS — Z6836 Body mass index (BMI) 36.0-36.9, adult: Secondary | ICD-10-CM

## 2015-07-30 DIAGNOSIS — G40319 Generalized idiopathic epilepsy and epileptic syndromes, intractable, without status epilepticus: Secondary | ICD-10-CM | POA: Diagnosis not present

## 2015-07-30 DIAGNOSIS — I10 Essential (primary) hypertension: Secondary | ICD-10-CM

## 2015-07-30 DIAGNOSIS — I6389 Other cerebral infarction: Secondary | ICD-10-CM

## 2015-07-30 LAB — POCT INR: INR: 1.9

## 2015-07-30 MED ORDER — FUROSEMIDE 20 MG PO TABS: ORAL_TABLET | ORAL | Status: DC

## 2015-07-30 NOTE — Patient Instructions (Signed)
Health Maintenance, Female Adopting a healthy lifestyle and getting preventive care can go a long way to promote health and wellness. Talk with your health care provider about what schedule of regular examinations is right for you. This is a good chance for you to check in with your provider about disease prevention and staying healthy. In between checkups, there are plenty of things you can do on your own. Experts have done a lot of research about which lifestyle changes and preventive measures are most likely to keep you healthy. Ask your health care provider for more information. WEIGHT AND DIET  Eat a healthy diet  Be sure to include plenty of vegetables, fruits, low-fat dairy products, and lean protein.  Do not eat a lot of foods high in solid fats, added sugars, or salt.  Get regular exercise. This is one of the most important things you can do for your health.  Most adults should exercise for at least 150 minutes each week. The exercise should increase your heart rate and make you sweat (moderate-intensity exercise).  Most adults should also do strengthening exercises at least twice a week. This is in addition to the moderate-intensity exercise.  Maintain a healthy weight  Body mass index (BMI) is a measurement that can be used to identify possible weight problems. It estimates body fat based on height and weight. Your health care provider can help determine your BMI and help you achieve or maintain a healthy weight.  For females 20 years of age and older:   A BMI below 18.5 is considered underweight.  A BMI of 18.5 to 24.9 is normal.  A BMI of 25 to 29.9 is considered overweight.  A BMI of 30 and above is considered obese.  Watch levels of cholesterol and blood lipids  You should start having your blood tested for lipids and cholesterol at 79 years of age, then have this test every 5 years.  You may need to have your cholesterol levels checked more often if:  Your lipid  or cholesterol levels are high.  You are older than 79 years of age.  You are at high risk for heart disease.  CANCER SCREENING   Lung Cancer  Lung cancer screening is recommended for adults 55-80 years old who are at high risk for lung cancer because of a history of smoking.  A yearly low-dose CT scan of the lungs is recommended for people who:  Currently smoke.  Have quit within the past 15 years.  Have at least a 30-pack-year history of smoking. A pack year is smoking an average of one pack of cigarettes a day for 1 year.  Yearly screening should continue until it has been 15 years since you quit.  Yearly screening should stop if you develop a health problem that would prevent you from having lung cancer treatment.  Breast Cancer  Practice breast self-awareness. This means understanding how your breasts normally appear and feel.  It also means doing regular breast self-exams. Let your health care provider know about any changes, no matter how small.  If you are in your 20s or 30s, you should have a clinical breast exam (CBE) by a health care provider every 1-3 years as part of a regular health exam.  If you are 40 or older, have a CBE every year. Also consider having a breast X-ray (mammogram) every year.  If you have a family history of breast cancer, talk to your health care provider about genetic screening.  If you   are at high risk for breast cancer, talk to your health care provider about having an MRI and a mammogram every year.  Breast cancer gene (BRCA) assessment is recommended for women who have family members with BRCA-related cancers. BRCA-related cancers include:  Breast.  Ovarian.  Tubal.  Peritoneal cancers.  Results of the assessment will determine the need for genetic counseling and BRCA1 and BRCA2 testing. Cervical Cancer Your health care provider may recommend that you be screened regularly for cancer of the pelvic organs (ovaries, uterus, and  vagina). This screening involves a pelvic examination, including checking for microscopic changes to the surface of your cervix (Pap test). You may be encouraged to have this screening done every 3 years, beginning at age 21.  For women ages 30-65, health care providers may recommend pelvic exams and Pap testing every 3 years, or they may recommend the Pap and pelvic exam, combined with testing for human papilloma virus (HPV), every 5 years. Some types of HPV increase your risk of cervical cancer. Testing for HPV may also be done on women of any age with unclear Pap test results.  Other health care providers may not recommend any screening for nonpregnant women who are considered low risk for pelvic cancer and who do not have symptoms. Ask your health care provider if a screening pelvic exam is right for you.  If you have had past treatment for cervical cancer or a condition that could lead to cancer, you need Pap tests and screening for cancer for at least 20 years after your treatment. If Pap tests have been discontinued, your risk factors (such as having a new sexual partner) need to be reassessed to determine if screening should resume. Some women have medical problems that increase the chance of getting cervical cancer. In these cases, your health care provider may recommend more frequent screening and Pap tests. Colorectal Cancer  This type of cancer can be detected and often prevented.  Routine colorectal cancer screening usually begins at 79 years of age and continues through 79 years of age.  Your health care provider may recommend screening at an earlier age if you have risk factors for colon cancer.  Your health care provider may also recommend using home test kits to check for hidden blood in the stool.  A small camera at the end of a tube can be used to examine your colon directly (sigmoidoscopy or colonoscopy). This is done to check for the earliest forms of colorectal  cancer.  Routine screening usually begins at age 50.  Direct examination of the colon should be repeated every 5-10 years through 79 years of age. However, you may need to be screened more often if early forms of precancerous polyps or small growths are found. Skin Cancer  Check your skin from head to toe regularly.  Tell your health care provider about any new moles or changes in moles, especially if there is a change in a mole's shape or color.  Also tell your health care provider if you have a mole that is larger than the size of a pencil eraser.  Always use sunscreen. Apply sunscreen liberally and repeatedly throughout the day.  Protect yourself by wearing long sleeves, pants, a wide-brimmed hat, and sunglasses whenever you are outside. HEART DISEASE, DIABETES, AND HIGH BLOOD PRESSURE   High blood pressure causes heart disease and increases the risk of stroke. High blood pressure is more likely to develop in:  People who have blood pressure in the high end   of the normal range (130-139/85-89 mm Hg).  People who are overweight or obese.  People who are African American.  If you are 38-23 years of age, have your blood pressure checked every 3-5 years. If you are 61 years of age or older, have your blood pressure checked every year. You should have your blood pressure measured twice--once when you are at a hospital or clinic, and once when you are not at a hospital or clinic. Record the average of the two measurements. To check your blood pressure when you are not at a hospital or clinic, you can use:  An automated blood pressure machine at a pharmacy.  A home blood pressure monitor.  If you are between 45 years and 39 years old, ask your health care provider if you should take aspirin to prevent strokes.  Have regular diabetes screenings. This involves taking a blood sample to check your fasting blood sugar level.  If you are at a normal weight and have a low risk for diabetes,  have this test once every three years after 79 years of age.  If you are overweight and have a high risk for diabetes, consider being tested at a younger age or more often. PREVENTING INFECTION  Hepatitis B  If you have a higher risk for hepatitis B, you should be screened for this virus. You are considered at high risk for hepatitis B if:  You were born in a country where hepatitis B is common. Ask your health care provider which countries are considered high risk.  Your parents were born in a high-risk country, and you have not been immunized against hepatitis B (hepatitis B vaccine).  You have HIV or AIDS.  You use needles to inject street drugs.  You live with someone who has hepatitis B.  You have had sex with someone who has hepatitis B.  You get hemodialysis treatment.  You take certain medicines for conditions, including cancer, organ transplantation, and autoimmune conditions. Hepatitis C  Blood testing is recommended for:  Everyone born from [birth year] through 1963.  Anyone with known risk factors for hepatitis C. Sexually transmitted infections (STIs)  You should be screened for sexually transmitted infections (STIs) including gonorrhea and chlamydia if:  You are sexually active and are younger than 79 years of age.  You are older than 79 years of age and your health care provider tells you that you are at risk for this type of infection.  Your sexual activity has changed since you were last screened and you are at an increased risk for chlamydia or gonorrhea. Ask your health care provider if you are at risk.  If you do not have HIV, but are at risk, it may be recommended that you take a prescription medicine daily to prevent HIV infection. This is called pre-exposure prophylaxis (PrEP). You are considered at risk if:  You are sexually active and do not regularly use condoms or know the HIV status of your partner(s).  You take drugs by injection.  You are sexually  active with a partner who has HIV. Talk with your health care provider about whether you are at high risk of being infected with HIV. If you choose to begin PrEP, you should first be tested for HIV. You should then be tested every 3 months for as long as you are taking PrEP.  PREGNANCY   If you are premenopausal and you may become pregnant, ask your health care provider about preconception counseling.  If you may  become pregnant, take 400 to 800 micrograms (mcg) of folic acid every day.  If you want to prevent pregnancy, talk to your health care provider about birth control (contraception). OSTEOPOROSIS AND MENOPAUSE   Osteoporosis is a disease in which the bones lose minerals and strength with aging. This can result in serious bone fractures. Your risk for osteoporosis can be identified using a bone density scan.  If you are 61 years of age or older, or if you are at risk for osteoporosis and fractures, ask your health care provider if you should be screened.  Ask your health care provider whether you should take a calcium or vitamin D supplement to lower your risk for osteoporosis.  Menopause may have certain physical symptoms and risks.  Hormone replacement therapy may reduce some of these symptoms and risks. Talk to your health care provider about whether hormone replacement therapy is right for you.  HOME CARE INSTRUCTIONS   Schedule regular health, dental, and eye exams.  Stay current with your immunizations.   Do not use any tobacco products including cigarettes, chewing tobacco, or electronic cigarettes.  If you are pregnant, do not drink alcohol.  If you are breastfeeding, limit how much and how often you drink alcohol.  Limit alcohol intake to no more than 1 drink per day for nonpregnant women. One drink equals 12 ounces of beer, 5 ounces of wine, or 1 ounces of hard liquor.  Do not use street drugs.  Do not share needles.  Ask your health care provider for help if  you need support or information about quitting drugs.  Tell your health care provider if you often feel depressed.  Tell your health care provider if you have ever been abused or do not feel safe at home.   This information is not intended to replace advice given to you by your health care provider. Make sure you discuss any questions you have with your health care provider.   Document Released: 03/14/2011 Document Revised: 09/19/2014 Document Reviewed: 07/31/2013 Elsevier Interactive Patient Education [employer].

## 2015-07-30 NOTE — Progress Notes (Signed)
Subjective:    Patient ID: Melissa Knight, female    DOB: [birth year], 81 y.o.   MRN: [id number]  Patient here today for follow up - no complaints today.  Hyperlipidemia This is a chronic problem. The current episode started more than 1 year ago. The problem is controlled. Exacerbating diseases include hypothyroidism and obesity. She has no history of diabetes. Pertinent negatives include no chest pain, myalgias or shortness of breath. Current antihyperlipidemic treatment includes statins. The current treatment provides moderate improvement of lipids. Compliance problems include adherence to diet and adherence to exercise.  Risk factors for coronary artery disease include dyslipidemia, family history, hypertension, obesity and post-menopausal.  Hypertension This is a chronic problem. The current episode started more than 1 year ago. The problem is controlled. Pertinent negatives include no chest pain, headaches, palpitations or shortness of breath. Risk factors for coronary artery disease include dyslipidemia, obesity and post-menopausal state. Past treatments include diuretics, ACE inhibitors and calcium channel blockers. The current treatment provides moderate improvement. Compliance problems include diet and exercise.  Hypertensive end-organ damage includes a thyroid problem.  Thyroid Problem Patient reports no cold intolerance, constipation, diarrhea, heat intolerance, palpitations or visual change. Her past medical history is significant for hyperlipidemia. There is no history of diabetes.  Seizures  Pertinent negatives include no headaches, no chest pain and no diarrhea.  atrial fib Coumadin level to be checked today CVA hx 2 years ago- no residual effects Metabolic syndrome Patient does not check blood sugars at home. tries to watch carbs in diet- no exercise Stress incontinence Has slight leak most of the time- wears pads- does not bother her that much.    Review of Systems   Respiratory: Negative for shortness of breath.   Cardiovascular: Negative for chest pain and palpitations.  Gastrointestinal: Negative for diarrhea and constipation.  Endocrine: Negative for cold intolerance and heat intolerance.  Musculoskeletal: Negative for myalgias.  Neurological: Positive for seizures. Negative for headaches.  All other systems reviewed and are negative.      Objective:   Physical Exam  Constitutional: She is oriented to person, place, and time. She appears well-developed and well-nourished.  HENT:  Nose: Nose normal.  Mouth/Throat: Oropharynx is clear and moist.  Eyes: EOM are normal.  Neck: Trachea normal, normal range of motion and full passive range of motion without pain. Neck supple. No JVD present. Carotid bruit is not present. No thyromegaly present.  Cardiovascular: Normal rate, regular rhythm, normal heart sounds and intact distal pulses.  Exam reveals no gallop and no friction rub.   No murmur heard. Pulmonary/Chest: Effort normal and breath sounds normal.  Abdominal: Soft. Bowel sounds are normal. She exhibits no distension and no mass. There is no tenderness.  Musculoskeletal: Normal range of motion.  Lymphadenopathy:    She has no cervical adenopathy.  Neurological: She is alert and oriented to person, place, and time. She has normal reflexes.  Skin: Skin is warm and dry.  Psychiatric: She has a normal mood and affect. Her behavior is normal. Judgment and thought content normal.     BP 130/88 mmHg  Pulse 62  Temp(Src) 96.9 F (36.1 C) (Oral)  Ht _0  (1.6 m)  Wt 214 lb (97.07 kg)  BMI 37.92 kg/m2      Assessment & Plan:   1. HTN (hypertension), malignant Do not add salt o diet - furosemide (LASIX) 20 MG tablet; TAKE 1 TABLET EVERY DAY AS NEEDED  Dispense: 30 tablet; Refill: 1 - CMP14+EGFR  2. Atrial  fibrillation, chronic ([place]) Continue warfrarin- see INR documentation - POCT INR  3. Hypothyroidism (acquired)  4. 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 Seizure disorder, generalized convulsive, intractable ([place])  6. Hyperlipidemia Low fat diet - Lipid panel  7. Obesity (BMI 30-39.9) Discussed diet and exercise for person with BMI >25 Will recheck weight in 3-6 months  8. BMI 36.0-36.9,adult  9. Metabolic syndrome Watch carbs in diet  10. Need for hepatitis C screening test - Hepatitis C antibody    Labs pending Health maintenance reviewed Diet and exercise encouraged Continue all meds Follow up  In 3 month   Emanuel, FNP

## 2015-07-31 LAB — CMP14+EGFR
ALT: 18 IU/L (ref 0–32)
AST: 22 IU/L (ref 0–40)
Albumin/Globulin Ratio: 1.8 (ref 1.1–2.5)
Albumin: 3.7 g/dL (ref 3.5–4.7)
Alkaline Phosphatase: 58 IU/L (ref 39–117)
BUN/Creatinine Ratio: 16 (ref 11–26)
BUN: 11 mg/dL (ref 8–27)
Bilirubin Total: 0.6 mg/dL (ref 0.0–1.2)
CALCIUM: 8.7 mg/dL (ref 8.7–10.3)
CO2: 22 mmol/L (ref 18–29)
CREATININE: 0.69 mg/dL (ref 0.57–1.00)
Chloride: 103 mmol/L (ref 97–106)
GFR, EST AFRICAN AMERICAN: 95 mL/min/{1.73_m2} (ref >59)
GFR, EST NON AFRICAN AMERICAN: 82 mL/min/{1.73_m2} (ref >59)
GLOBULIN, TOTAL: 2.1 g/dL (ref 1.5–4.5)
Glucose: 92 mg/dL (ref 65–99)
Potassium: 4 mmol/L (ref 3.5–5.2)
SODIUM: 141 mmol/L (ref 136–144)
TOTAL PROTEIN: 5.8 g/dL — AB (ref 6.0–8.5)

## 2015-07-31 LAB — LIPID PANEL
CHOL/HDL RATIO: 3.6 ratio (ref 0.0–4.4)
Cholesterol, Total: 183 mg/dL (ref 100–199)
HDL: 51 mg/dL (ref >39)
LDL Calculated: 101 mg/dL — ABNORMAL HIGH (ref 0–99)
TRIGLYCERIDES: 156 mg/dL — AB (ref 0–149)
VLDL Cholesterol Cal: 31 mg/dL (ref 5–40)

## 2015-07-31 LAB — HEPATITIS C ANTIBODY

## 2015-08-31 ENCOUNTER — Ambulatory Visit (INDEPENDENT_AMBULATORY_CARE_PROVIDER_SITE_OTHER): Payer: Medicare Other | Admitting: Pharmacist

## 2015-08-31 DIAGNOSIS — I482 Chronic atrial fibrillation, unspecified: Secondary | ICD-10-CM

## 2015-08-31 DIAGNOSIS — E039 Hypothyroidism, unspecified: Secondary | ICD-10-CM | POA: Diagnosis not present

## 2015-08-31 LAB — POCT INR: INR: 2.4

## 2015-08-31 MED ORDER — LEVOTHYROXINE SODIUM 88 MCG PO TABS: 88.0000 ug | ORAL_TABLET | Freq: Every day | ORAL | Status: DC

## 2015-08-31 NOTE — Patient Instructions (Signed)
Anticoagulation Dose Instructions as of 08/31/2015      Melissa Knight Tue Wed Thu Fri Sat   New Dose 3.75 mg 3.75 mg 3.75 mg 3.75 mg 3.75 mg 3.75 mg 3.75 mg    Description        Continue current warfarin dose of 7.5mg  - take 1/2 tablet daily     INR was 2.4 today

## 2015-10-12 ENCOUNTER — Ambulatory Visit (INDEPENDENT_AMBULATORY_CARE_PROVIDER_SITE_OTHER): Payer: Medicare Other | Admitting: Pharmacist

## 2015-10-12 ENCOUNTER — Ambulatory Visit: Payer: Self-pay | Admitting: Pharmacist

## 2015-10-12 ENCOUNTER — Encounter: Payer: Self-pay | Admitting: Pharmacist

## 2015-10-12 VITALS — BP 112/72 | HR 64 | Ht 63.0 in | Wt 211.0 lb

## 2015-10-12 DIAGNOSIS — I482 Chronic atrial fibrillation, unspecified: Secondary | ICD-10-CM

## 2015-10-12 DIAGNOSIS — Z0000 Encounter for general adult medical examination without abnormal findings: Secondary | ICD-10-CM | POA: Diagnosis not present

## 2015-10-12 LAB — POCT INR: INR: 2

## 2015-10-12 NOTE — Patient Instructions (Addendum)
Anticoagulation Dose Instructions as of 10/12/2015      Melissa Knight Tue Wed Thu Fri Sat   New Dose 3.75 mg 3.75 mg 3.75 mg 3.75 mg 3.75 mg 3.75 mg 3.75 mg    Description        Continue current warfarin dose of 7.45m - take 1/2 tablet daily     INR was 2.0 today.   Melissa Knight, Thank you for taking time to come for your Medicare Wellness Visit. I appreciate your ongoing commitment to your health goals. Please review the following plan we discussed and let me know if I can assist you in the future.   These are the goals we discussed: Try Chair exercises form handout   Increase non-starchy vegetables - carrots, green bean, squash, zucchini, tomatoes, onions, peppers, spinach and other green leafy vegetables, cabbage, lettuce, cucumbers, asparagus, okra (not fried), eggplant limit sugar and processed foods (cakes, cookies, ice cream, crackers and chips) Increase fresh fruit but limit serving sizes 1/2 cup or about the size of tennis or baseball limit red meat to no more than 1-2 times per week (serving size about the size of your palm) Choose whole grains / lean proteins - whole wheat bread, quinoa, whole grain rice (1/2 cup), fish, chicken, [place]   This is a list of the screening recommended for you and due dates:  Health Maintenance  Topic Date Due  . Mammogram  10/18/2015*  . Tetanus Vaccine  10/18/2015*  . Flu Shot  04/12/2016  . DEXA scan (bone density measurement)  Completed  . Shingles Vaccine  Completed  . Pneumonia vaccines  Completed  *Topic was postponed. The date shown is not the original due date.    Health Maintenance, Female Adopting a healthy lifestyle and getting preventive care can go a long way to promote health and wellness. Talk with your health care provider about what schedule of regular examinations is right for you. This is a good chance for you to check in with your provider about disease prevention and staying healthy. In between checkups, there are plenty  of things you can do on your own. Experts have done a lot of research about which lifestyle changes and preventive measures are most likely to keep you healthy. Ask your health care provider for more information. WEIGHT AND DIET  Eat a healthy diet  Be sure to include plenty of vegetables, fruits, low-fat dairy products, and lean protein.  Do not eat a lot of foods high in solid fats, added sugars, or salt.  Get regular exercise. This is one of the most important things you can do for your health.  Most adults should exercise for at least 150 minutes each week. The exercise should increase your heart rate and make you sweat (moderate-intensity exercise).  Most adults should also do strengthening exercises at least twice a week. This is in addition to the moderate-intensity exercise.  Maintain a healthy weight  Body mass index (BMI) is a measurement that can be used to identify possible weight problems. It estimates body fat based on height and weight. Your health care provider can help determine your BMI and help you achieve or maintain a healthy weight.  For females 22years of age and older:   A BMI below 18.5 is considered underweight.  A BMI of 18.5 to 24.9 is normal.  A BMI of 25 to 29.9 is considered overweight.  A BMI of 30 and above is considered obese.  Watch levels of cholesterol  and blood lipids  You should start having your blood tested for lipids and cholesterol at 80 years of age, then have this test every 5 years.  You may need to have your cholesterol levels checked more often if:  Your lipid or cholesterol levels are high.  You are older than 80 years of age.  You are at high risk for heart disease.  CANCER SCREENING   Lung Cancer  Lung cancer screening is recommended for adults 27-55 years old who are at high risk for lung cancer because of a history of smoking.  A yearly low-dose CT scan of the lungs is recommended for people who:  Currently  smoke.  Have quit within the past 15 years.  Have at least a 30-pack-year history of smoking. A pack year is smoking an average of one pack of cigarettes a day for 1 year.  Yearly screening should continue until it has been 15 years since you quit.  Yearly screening should stop if you develop a health problem that would prevent you from having lung cancer treatment.  Breast Cancer  Practice breast self-awareness. This means understanding how your breasts normally appear and feel.  It also means doing regular breast self-exams. Let your health care provider know about any changes, no matter how small.  If you are in your 20s or 30s, you should have a clinical breast exam (CBE) by a health care provider every 1-3 years as part of a regular health exam.  If you are 74 or older, have a CBE every year. Also consider having a breast X-ray (mammogram) every year.  If you have a family history of breast cancer, talk to your health care provider about genetic screening.  If you are at high risk for breast cancer, talk to your health care provider about having an MRI and a mammogram every year.  Breast cancer gene (BRCA) assessment is recommended for women who have family members with BRCA-related cancers. BRCA-related cancers include:  Breast.  Ovarian.  Tubal.  Peritoneal cancers.  Results of the assessment will determine the need for genetic counseling and BRCA1 and BRCA2 testing. Cervical Cancer Your health care provider may recommend that you be screened regularly for cancer of the pelvic organs (ovaries, uterus, and vagina). This screening involves a pelvic examination, including checking for microscopic changes to the surface of your cervix (Pap test). You may be encouraged to have this screening done every 3 years, beginning at age 76.  For women ages 43-65, health care providers may recommend pelvic exams and Pap testing every 3 years, or they may recommend the Pap and pelvic  exam, combined with testing for human papilloma virus (HPV), every 5 years. Some types of HPV increase your risk of cervical cancer. Testing for HPV may also be done on women of any age with unclear Pap test results.  Other health care providers may not recommend any screening for nonpregnant women who are considered low risk for pelvic cancer and who do not have symptoms. Ask your health care provider if a screening pelvic exam is right for you.  If you have had past treatment for cervical cancer or a condition that could lead to cancer, you need Pap tests and screening for cancer for at least 20 years after your treatment. If Pap tests have been discontinued, your risk factors (such as having a new sexual partner) need to be reassessed to determine if screening should resume. Some women have medical problems that increase the chance of  getting cervical cancer. In these cases, your health care provider may recommend more frequent screening and Pap tests. Colorectal Cancer  This type of cancer can be detected and often prevented.  Routine colorectal cancer screening usually begins at 80 years of age and continues through 80 years of age.  Your health care provider may recommend screening at an earlier age if you have risk factors for colon cancer.  Your health care provider may also recommend using home test kits to check for hidden blood in the stool.  A small camera at the end of a tube can be used to examine your colon directly (sigmoidoscopy or colonoscopy). This is done to check for the earliest forms of colorectal cancer.  Routine screening usually begins at age 35.  Direct examination of the colon should be repeated every 5-10 years through 80 years of age. However, you may need to be screened more often if early forms of precancerous polyps or small growths are found. Skin Cancer  Check your skin from head to toe regularly.  Tell your health care provider about any new moles or  changes in moles, especially if there is a change in a mole's shape or color.  Also tell your health care provider if you have a mole that is larger than the size of a pencil eraser.  Always use sunscreen. Apply sunscreen liberally and repeatedly throughout the day.  Protect yourself by wearing long sleeves, pants, a wide-brimmed hat, and sunglasses whenever you are outside. HEART DISEASE, DIABETES, AND HIGH BLOOD PRESSURE   High blood pressure causes heart disease and increases the risk of stroke. High blood pressure is more likely to develop in:  People who have blood pressure in the high end of the normal range (130-139/85-89 mm Hg).  People who are overweight or obese.  People who are African American.  If you are 43-54 years of age, have your blood pressure checked every 3-5 years. If you are 58 years of age or older, have your blood pressure checked every year. You should have your blood pressure measured twice--once when you are at a hospital or clinic, and once when you are not at a hospital or clinic. Record the average of the two measurements. To check your blood pressure when you are not at a hospital or clinic, you can use:  An automated blood pressure machine at a pharmacy.  A home blood pressure monitor.  If you are between 55 years and 38 years old, ask your health care provider if you should take aspirin to prevent strokes.  Have regular diabetes screenings. This involves taking a blood sample to check your fasting blood sugar level.  If you are at a normal weight and have a low risk for diabetes, have this test once every three years after 80 years of age.  If you are overweight and have a high risk for diabetes, consider being tested at a younger age or more often. PREVENTING INFECTION  Hepatitis B  If you have a higher risk for hepatitis B, you should be screened for this virus. You are considered at high risk for hepatitis B if:  You were born in a country where  hepatitis B is common. Ask your health care provider which countries are considered high risk.  Your parents were born in a high-risk country, and you have not been immunized against hepatitis B (hepatitis B vaccine).  You have HIV or AIDS.  You use needles to inject street drugs.  You live  with someone who has hepatitis B.  You have had sex with someone who has hepatitis B.  You get hemodialysis treatment.  You take certain medicines for conditions, including cancer, organ transplantation, and autoimmune conditions. Hepatitis C  Blood testing is recommended for:  Everyone born from [birth year] through 1963.  Anyone with known risk factors for hepatitis C. Sexually transmitted infections (STIs)  You should be screened for sexually transmitted infections (STIs) including gonorrhea and chlamydia if:  You are sexually active and are younger than 80 years of age.  You are older than 80 years of age and your health care provider tells you that you are at risk for this type of infection.  Your sexual activity has changed since you were last screened and you are at an increased risk for chlamydia or gonorrhea. Ask your health care provider if you are at risk.  If you do not have HIV, but are at risk, it may be recommended that you take a prescription medicine daily to prevent HIV infection. This is called pre-exposure prophylaxis (PrEP). You are considered at risk if:  You are sexually active and do not regularly use condoms or know the HIV status of your partner(s).  You take drugs by injection.  You are sexually active with a partner who has HIV. Talk with your health care provider about whether you are at high risk of being infected with HIV. If you choose to begin PrEP, you should first be tested for HIV. You should then be tested every 3 months for as long as you are taking PrEP.  PREGNANCY   If you are premenopausal and you may become pregnant, ask your health care provider about  preconception counseling.  If you may become pregnant, take 400 to 800 micrograms (mcg) of folic acid every day.  If you want to prevent pregnancy, talk to your health care provider about birth control (contraception). OSTEOPOROSIS AND MENOPAUSE   Osteoporosis is a disease in which the bones lose minerals and strength with aging. This can result in serious bone fractures. Your risk for osteoporosis can be identified using a bone density scan.  If you are 22 years of age or older, or if you are at risk for osteoporosis and fractures, ask your health care provider if you should be screened.  Ask your health care provider whether you should take a calcium or vitamin D supplement to lower your risk for osteoporosis.  Menopause may have certain physical symptoms and risks.  Hormone replacement therapy may reduce some of these symptoms and risks. Talk to your health care provider about whether hormone replacement therapy is right for you.  HOME CARE INSTRUCTIONS   Schedule regular health, dental, and eye exams.  Stay current with your immunizations.   Do not use any tobacco products including cigarettes, chewing tobacco, or electronic cigarettes.  If you are pregnant, do not drink alcohol.  If you are breastfeeding, limit how much and how often you drink alcohol.  Limit alcohol intake to no more than 1 drink per day for nonpregnant women. One drink equals 12 ounces of beer, 5 ounces of wine, or 1 ounces of hard liquor.  Do not use street drugs.  Do not share needles.  Ask your health care provider for help if you need support or information about quitting drugs.  Tell your health care provider if you often feel depressed.  Tell your health care provider if you have ever been abused or do not feel safe at home.  This information is not intended to replace advice given to you by your health care provider. Make sure you discuss any questions you have with your health care  provider.   Document Released: 03/14/2011 Document Revised: 09/19/2014 Document Reviewed: 07/31/2013 Elsevier Interactive Patient Education [employer].

## 2015-10-12 NOTE — Progress Notes (Signed)
Patient ID: [name], female   DOB: 12-May-1933, 82 y.o.   MRN: [id number]   Subjective:   Noralee Calafiore is a 82 y.o. WHITE female who presents for a subsequent Medicare Annual Wellness Visit. Mrs. Wymer lives with her husband in [place], [place].  She is retired.  Today patient is a little flustered because she got the time wrong for her appointment.  Review of Systems  Review of Systems  Constitutional: Negative.   HENT: Negative.   Eyes: Negative.   Respiratory: Negative.   Cardiovascular: Negative.   Gastrointestinal: Negative.   Genitourinary: Positive for frequency.  Musculoskeletal: Positive for joint pain.  Skin: Negative.   Neurological: Negative.   Endo/Heme/Allergies: Negative.   Psychiatric/Behavioral: Negative.      Current Medications (verified) Outpatient Encounter Prescriptions as of 10/12/2015  Medication Sig  . amLODipine (NORVASC) 2.5 MG tablet Take 1 tablet (2.5 mg total) by mouth daily.  Marland Kitchen atorvastatin (LIPITOR) 10 MG tablet TAKE 1 TABLET (10 MG TOTAL) BY MOUTH DAILY AT 6 PM.  . betamethasone dipropionate (DIPROLENE) 0.05 % cream Apply to affected areas as needed  . furosemide (LASIX) 20 MG tablet TAKE 1 TABLET EVERY DAY AS NEEDED  . levETIRAcetam (KEPPRA) 500 MG tablet TAKE 1 TABLET (500 MG TOTAL) BY MOUTH 2 (TWO) TIMES DAILY.  Marland Kitchen levothyroxine (SYNTHROID, LEVOTHROID) 88 MCG tablet Take 1 tablet (88 mcg total) by mouth daily.  Marland Kitchen lisinopril (PRINIVIL,ZESTRIL) 20 MG tablet TAKE 1 TABLET (20 MG TOTAL) BY MOUTH 2 (TWO) TIMES DAILY.  . metoprolol (LOPRESSOR) 50 MG tablet TAKE 2 TABLETS IN AM AND 1 AT BEDTIME  . warfarin (COUMADIN) 7.5 MG tablet Take 1 tablet (7.5 mg total) by mouth one time only at 6 PM.   No facility-administered encounter medications on file as of 10/12/2015.    Allergies (verified) Sulfa antibiotics   History: Past Medical History  Diagnosis Date  . Hypertension   . Hypothyroidism   . Atrial fibrillation ([place]) 06/2012    Echocardiogram  06/30/12: EF 60-65%, mild MR, PASP 33.  Marland Kitchen Acute ischemic stroke ([place]) 06/2012    acute right parietal ischemic stroke - likely cardioembolic in setting of AFib => coumadin started and followed by PCP  . Seizure ([place]) 06/2012    in the setting of acute ischemic stroke  . Lichen sclerosus et atrophicus of the vulva   . Hyperlipidemia    Past Surgical History  Procedure Laterality Date  . Bladder surgery      bladder prolapse  . Tubal ligation     Family History  Problem Relation Age of Onset  . Atrial fibrillation Mother     pacemaker  . Hypertension Mother   . Heart disease Sister     MI in 39s  . Stroke Sister   . Heart disease Father   . Heart disease Sister   . Heart disease Sister   . Hypertension Sister   . Anxiety disorder Sister   . Heart disease Brother   . Cancer Brother     pancreatic   Social History   Occupational History  . Not on file.   Social History Main Topics  . Smoking status: Former Smoker -- 1.00 packs/day    Types: Cigarettes    Quit date: 09/30/1978  . Smokeless tobacco: Never Used  . Alcohol Use: 0.6 oz/week    1 Glasses of wine per week  . Drug Use: No  . Sexual Activity: No    Do you feel safe at home?  Yes  Dietary issues and exercise activities: Current Exercise Habits:: Exercise is limited by, Limited by:: orthopedic condition(s)  Current Dietary habits:  Patient tried to limit fried foods.  She does admit to eating her favorite foods hot dogs and bologna sandwiches more than she know she should.  Objective:    Today's Vitals   10/12/15 1420  BP: 112/72  Pulse: 64  Height: 5\' 3"  (1.6 m)  Weight: 211 lb (95.709 kg)  PainSc: 2   PainLoc: Knee   Body mass index is 37.39 kg/(m^2).   INR was 2.0 today  Activities of Daily Living In your present state of health, do you have any difficulty performing the following activities: 10/12/2015  Hearing? N  Vision? Y  Difficulty concentrating or making decisions? N  Walking or  climbing stairs? Y  Dressing or bathing? N  Doing errands, shopping? N  Preparing Food and eating ? N  Using the Toilet? N  In the past six months, have you accidently leaked urine? Y  Do you have problems with loss of bowel control? N  Managing your Medications? N  Managing your Finances? N  Housekeeping or managing your Housekeeping? N    Are there smokers in your home (other than you)? No   Cardiac Risk Factors include: advanced age (>104men, >61 women);dyslipidemia;hypertension;obesity (BMI >30kg/m2);sedentary lifestyle  Depression Screen PHQ 2/9 Scores 10/12/2015 04/17/2015 01/08/2015 10/10/2014  PHQ - 2 Score 0 0 0 0    Fall Risk Fall Risk  10/12/2015 04/17/2015 01/08/2015 10/10/2014 09/10/2014  Falls in the past year? No No No No No    Cognitive Function: MMSE - Mini Mental State Exam 10/12/2015  Orientation to time 5  Orientation to Place 5  Registration 3  Attention/ Calculation 5  Recall 3  Language- name 2 objects 2  Language- repeat 1  Language- follow 3 step command 3  Language- read & follow direction 1  Write a sentence 1  Copy design 1  Total score 30    Immunizations and Health Maintenance Immunization History  Administered Date(s) Administered  . Influenza Split 07/03/2012  . Influenza,inj,Quad PF,36+ Mos 09/19/2013, 07/10/2014, 06/25/2015  . Pneumococcal Conjugate-13 07/10/2014  . Pneumococcal Polysaccharide-23 12/28/2010  . Zoster 01/08/2015   There are no preventive care reminders to display for this patient.  Patient Care Team: Chevis Pretty, FNP as PCP - General (Nurse Practitioner) Sherren Mocha, MD as Consulting Physician (Cardiology) Liliane Shi, PA-C as Physician Assistant (Physician Assistant)  Indicate any recent Medical Services you may have received from other than [facility] providers in the past year (date may be approximate).    Assessment:    Annual Wellness Visit  Therapeutic anticoagulation   Screening Tests Health  Maintenance  Topic Date Due  . MAMMOGRAM  10/18/2015 (Originally 12/03/2014)  . TETANUS/TDAP  10/18/2015 (Originally 10/16/1951)  . INFLUENZA VACCINE  04/12/2016  . DEXA SCAN  01/01/2017  . ZOSTAVAX  Completed  . PNA vac Low Risk Adult  Completed        Plan:   During the course of the visit Jonika was educated and counseled about the following appropriate screening and preventive services:   Vaccines to include Pneumoccal, Influenza, Hepatitis B, Td, Zostavax,  Colorectal cancer screening - patient is due Tdap and Zostavax.  Discussed pros and cons and checked price - patient declined  Cardiovascular disease screening - last EKG was 06/2015  Hyperlipidemia- LDL and Tg were slightly elevated when last checked - patient is taking atorvastatin and tolerating well.  Discussed  dietary changes to help lower LDL and Tg  BP - at goal today  Diabetes screening - last A1c was 5.5% and last FBG was 92  Bone Denisty / Osteoporosis Screening - last DEXA was normal - due to recheck 2018 or 2019  Mammogram - patient declined  Glaucoma screening /  Eye Exam - reminded to get eye exam she is due recheck  Nutrition counseling - discussed limiting high fat and caloric foods to keep weight and lipids in down.  Advanced Directives - Information given  Physical Activity - patient given handout and discussed Chair exercises  Patient Instructions (the written plan) were given to the patient.   Cherre Robins, [facility]   10/12/2015

## 2015-10-18 ENCOUNTER — Other Ambulatory Visit: Payer: Self-pay | Admitting: Nurse Practitioner

## 2015-10-20 ENCOUNTER — Other Ambulatory Visit: Payer: Self-pay | Admitting: Nurse Practitioner

## 2015-11-06 ENCOUNTER — Ambulatory Visit (INDEPENDENT_AMBULATORY_CARE_PROVIDER_SITE_OTHER): Payer: Medicare Other | Admitting: Nurse Practitioner

## 2015-11-06 ENCOUNTER — Encounter: Payer: Self-pay | Admitting: Nurse Practitioner

## 2015-11-06 VITALS — BP 139/88 | HR 66 | Temp 98.1°F | Ht 63.0 in | Wt 210.8 lb

## 2015-11-06 DIAGNOSIS — E8881 Metabolic syndrome: Secondary | ICD-10-CM

## 2015-11-06 DIAGNOSIS — G40319 Generalized idiopathic epilepsy and epileptic syndromes, intractable, without status epilepticus: Secondary | ICD-10-CM | POA: Diagnosis not present

## 2015-11-06 DIAGNOSIS — E785 Hyperlipidemia, unspecified: Secondary | ICD-10-CM | POA: Diagnosis not present

## 2015-11-06 DIAGNOSIS — I482 Chronic atrial fibrillation, unspecified: Secondary | ICD-10-CM

## 2015-11-06 DIAGNOSIS — Z6836 Body mass index (BMI) 36.0-36.9, adult: Secondary | ICD-10-CM

## 2015-11-06 DIAGNOSIS — N393 Stress incontinence (female) (male): Secondary | ICD-10-CM | POA: Diagnosis not present

## 2015-11-06 DIAGNOSIS — I638 Other cerebral infarction: Secondary | ICD-10-CM

## 2015-11-06 DIAGNOSIS — I10 Essential (primary) hypertension: Secondary | ICD-10-CM

## 2015-11-06 DIAGNOSIS — E039 Hypothyroidism, unspecified: Secondary | ICD-10-CM | POA: Diagnosis not present

## 2015-11-06 DIAGNOSIS — I6389 Other cerebral infarction: Secondary | ICD-10-CM

## 2015-11-06 MED ORDER — LEVOTHYROXINE SODIUM 88 MCG PO TABS: 88.0000 ug | ORAL_TABLET | Freq: Every day | ORAL | Status: DC

## 2015-11-06 MED ORDER — LISINOPRIL 20 MG PO TABS: ORAL_TABLET | ORAL | Status: DC

## 2015-11-06 MED ORDER — ATORVASTATIN CALCIUM 10 MG PO TABS: ORAL_TABLET | ORAL | Status: DC

## 2015-11-06 MED ORDER — LEVETIRACETAM 500 MG PO TABS: ORAL_TABLET | ORAL | Status: DC

## 2015-11-06 MED ORDER — METOPROLOL TARTRATE 50 MG PO TABS: ORAL_TABLET | ORAL | Status: DC

## 2015-11-06 MED ORDER — AMLODIPINE BESYLATE 2.5 MG PO TABS: 2.5000 mg | ORAL_TABLET | Freq: Every day | ORAL | Status: DC

## 2015-11-06 MED ORDER — FUROSEMIDE 20 MG PO TABS: ORAL_TABLET | ORAL | Status: DC

## 2015-11-06 NOTE — Patient Instructions (Signed)
Fall Prevention in the Home  Falls can cause injuries and can affect people from all age groups. There are many simple things that you can do to make your home safe and to help prevent falls. WHAT CAN I DO ON THE OUTSIDE OF MY HOME?  Regularly repair the edges of walkways and driveways and fix any cracks.  Remove high doorway thresholds.  Trim any shrubbery on the main path into your home.  Use bright outdoor lighting.  Clear walkways of debris and clutter, including tools and rocks.  Regularly check that handrails are securely fastened and in good repair. Both sides of any steps should have handrails.  Install guardrails along the edges of any raised decks or porches.  Have leaves, snow, and ice cleared regularly.  Use sand or salt on walkways during winter months.  In the garage, clean up any spills right away, including grease or oil spills. WHAT CAN I DO IN THE BATHROOM?  Use night lights.  Install grab bars by the toilet and in the tub and shower. Do not use towel bars as grab bars.  Use non-skid mats or decals on the floor of the tub or shower.  If you need to sit down while you are in the shower, use a plastic, non-slip stool..  Keep the floor dry. Immediately clean up any water that spills on the floor.  Remove soap buildup in the tub or shower on a regular basis.  Attach bath mats securely with double-sided non-slip rug tape.  Remove throw rugs and other tripping hazards from the floor. WHAT CAN I DO IN THE BEDROOM?  Use night lights.  Make sure that a bedside light is easy to reach.  Do not use oversized bedding that drapes onto the floor.  Have a firm chair that has side arms to use for getting dressed.  Remove throw rugs and other tripping hazards from the floor. WHAT CAN I DO IN THE KITCHEN?   Clean up any spills right away.  Avoid walking on wet floors.  Place frequently used items in easy-to-reach places.  If you need to reach for something  above you, use a sturdy step stool that has a grab bar.  Keep electrical cables out of the way.  Do not use floor polish or wax that makes floors slippery. If you have to use wax, make sure that it is non-skid floor wax.  Remove throw rugs and other tripping hazards from the floor. WHAT CAN I DO IN THE STAIRWAYS?  Do not leave any items on the stairs.  Make sure that there are handrails on both sides of the stairs. Fix handrails that are broken or loose. Make sure that handrails are as long as the stairways.  Check any carpeting to make sure that it is firmly attached to the stairs. Fix any carpet that is loose or worn.  Avoid having throw rugs at the top or bottom of stairways, or secure the rugs with carpet tape to prevent them from moving.  Make sure that you have a light switch at the top of the stairs and the bottom of the stairs. If you do not have them, have them installed. WHAT ARE SOME OTHER FALL PREVENTION TIPS?  Wear closed-toe shoes that fit well and support your feet. Wear shoes that have rubber soles or low heels.  When you use a stepladder, make sure that it is completely opened and that the sides are firmly locked. Have someone hold the ladder while you   are using it. Do not climb a closed stepladder.  Add color or contrast paint or tape to grab bars and handrails in your home. Place contrasting color strips on the first and last steps.  Use mobility aids as needed, such as canes, walkers, scooters, and crutches.  Turn on lights if it is dark. Replace any light bulbs that burn out.  Set up furniture so that there are clear paths. Keep the furniture in the same spot.  Fix any uneven floor surfaces.  Choose a carpet design that does not hide the edge of steps of a stairway.  Be aware of any and all pets.  Review your medicines with your healthcare provider. Some medicines can cause dizziness or changes in blood pressure, which increase your risk of falling. Talk  with your health care provider about other ways that you can decrease your risk of falls. This may include working with a physical therapist or trainer to improve your strength, balance, and endurance.   This information is not intended to replace advice given to you by your health care provider. Make sure you discuss any questions you have with your health care provider.   Document Released: 08/19/2002 Document Revised: 01/13/2015 Document Reviewed: 10/03/2014 Elsevier Interactive Patient Education 2016 Elsevier Inc.  

## 2015-11-06 NOTE — Progress Notes (Signed)
Subjective:    Patient ID: Melissa Knight, female    DOB: Jan 27, 1933, 82 y.o.   MRN: [id number]  Patient here today for follow up of chronic medical problems.  Outpatient Encounter Prescriptions as of 11/06/2015  Medication Sig  . amLODipine (NORVASC) 2.5 MG tablet Take 1 tablet (2.5 mg total) by mouth daily.  Marland Kitchen atorvastatin (LIPITOR) 10 MG tablet TAKE 1 TABLET (10 MG TOTAL) BY MOUTH DAILY AT 6 PM.  . betamethasone dipropionate (DIPROLENE) 0.05 % cream Apply to affected areas as needed  . furosemide (LASIX) 20 MG tablet TAKE 1 TABLET EVERY DAY AS NEEDED  . levETIRAcetam (KEPPRA) 500 MG tablet TAKE 1 TABLET (500 MG TOTAL) BY MOUTH 2 (TWO) TIMES DAILY.  Marland Kitchen levothyroxine (SYNTHROID, LEVOTHROID) 88 MCG tablet Take 1 tablet (88 mcg total) by mouth daily.  Marland Kitchen lisinopril (PRINIVIL,ZESTRIL) 20 MG tablet TAKE 1 TABLET (20 MG TOTAL) BY MOUTH 2 (TWO) TIMES DAILY.  . metoprolol (LOPRESSOR) 50 MG tablet TAKE 2 TABLETS IN AM AND 1 AT BEDTIME  . warfarin (COUMADIN) 7.5 MG tablet Take 1 tablet (7.5 mg total) by mouth one time only at 6 PM.   No facility-administered encounter medications on file as of 11/06/2015.      Hypertension This is a chronic problem. The current episode started more than 1 year ago. The problem is controlled. Pertinent negatives include no chest pain, headaches, palpitations or shortness of breath. Risk factors for coronary artery disease include dyslipidemia, obesity and post-menopausal state. Past treatments include diuretics, ACE inhibitors and calcium channel blockers. The current treatment provides moderate improvement. Compliance problems include diet and exercise.  Hypertensive end-organ damage includes a thyroid problem.  Hyperlipidemia This is a chronic problem. The current episode started more than 1 year ago. The problem is controlled. Exacerbating diseases include hypothyroidism and obesity. She has no history of diabetes. Pertinent negatives include no chest pain, myalgias or  shortness of breath. Current antihyperlipidemic treatment includes statins. The current treatment provides moderate improvement of lipids. Compliance problems include adherence to diet and adherence to exercise.  Risk factors for coronary artery disease include dyslipidemia, family history, hypertension, obesity and post-menopausal.  Thyroid Problem Patient reports no cold intolerance, constipation, diarrhea, heat intolerance, palpitations or visual change. Her past medical history is significant for hyperlipidemia. There is no history of diabetes.  Seizures  Pertinent negatives include no headaches, no chest pain and no diarrhea.  atrial fib Coumadin level to be checked today CVA hx 2 years ago- no residual effects Metabolic syndrome Patient does not check blood sugars at home. tries to watch carbs in diet- no exercise Stress incontinence Has slight leak most of the time- wears pads- does not bother her that much.    Review of Systems  Respiratory: Negative for shortness of breath.   Cardiovascular: Negative for chest pain and palpitations.  Gastrointestinal: Negative for diarrhea and constipation.  Endocrine: Negative for cold intolerance and heat intolerance.  Musculoskeletal: Negative for myalgias.  Neurological: Positive for seizures. Negative for headaches.  All other systems reviewed and are negative.      Objective:   Physical Exam  Constitutional: She is oriented to person, place, and time. She appears well-developed and well-nourished.  HENT:  Nose: Nose normal.  Mouth/Throat: Oropharynx is clear and moist.  Eyes: EOM are normal.  Neck: Trachea normal, normal range of motion and full passive range of motion without pain. Neck supple. No JVD present. Carotid bruit is not present. No thyromegaly present.  Cardiovascular: Normal rate, regular rhythm,  normal heart sounds and intact distal pulses.  Exam reveals no gallop and no friction rub.   No murmur  heard. Pulmonary/Chest: Effort normal and breath sounds normal.  Abdominal: Soft. Bowel sounds are normal. She exhibits no distension and no mass. There is no tenderness.  Musculoskeletal: Normal range of motion.  Lymphadenopathy:    She has no cervical adenopathy.  Neurological: She is alert and oriented to person, place, and time. She has normal reflexes.  Skin: Skin is warm and dry.  Psychiatric: She has a normal mood and affect. Her behavior is normal. Judgment and thought content normal.     BP 139/88 mmHg  Pulse 66  Temp(Src) 98.1 F (36.7 C) (Oral)  Ht 5' 3"  (1.6 m)  Wt 210 lb 12.8 oz (95.618 kg)  BMI 37.35 kg/m2      Assessment & Plan:   1. HTN (hypertension), malignant Do not add salt to diet - amLODipine (NORVASC) 2.5 MG tablet; Take 1 tablet (2.5 mg total) by mouth daily.  Dispense: 90 tablet; Refill: 1 - lisinopril (PRINIVIL,ZESTRIL) 20 MG tablet; TAKE 1 TABLET (20 MG TOTAL) BY MOUTH 2 (TWO) TIMES DAILY.  Dispense: 180 tablet; Refill: 1 - furosemide (LASIX) 20 MG tablet; TAKE 1 TABLET EVERY DAY AS NEEDED  Dispense: 30 tablet; Refill: 1 - CMP14+EGFR  2. Atrial fibrillation, chronic (HCC) Keep follow up with cardiology - metoprolol (LOPRESSOR) 50 MG tablet; TAKE 2 TABLETS IN AM AND 1 AT BEDTIME  Dispense: 270 tablet; Refill: 1  3. Hypothyroidism (acquired) - levothyroxine (SYNTHROID, LEVOTHROID) 88 MCG tablet; Take 1 tablet (88 mcg total) by mouth daily.  Dispense: 90 tablet; Refill: 0  4. 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 Hyperlipidemia Low fat diet - atorvastatin (LIPITOR) 10 MG tablet; TAKE 1 TABLET (10 MG TOTAL) BY MOUTH DAILY AT 6 PM.  Dispense: 90 tablet; Refill: 1 - Lipid panel  6. Metabolic syndrome Low carb diet  7. Stress incontinence  8. BMI 36.0-36.9,adult Discussed diet and exercise for person with BMI >25 Will recheck weight in 3-6 months   9. Seizure disorder, generalized convulsive, intractable (HCC) - levETIRAcetam  (KEPPRA) 500 MG tablet; TAKE 1 TABLET (500 MG TOTAL) BY MOUTH 2 (TWO) TIMES DAILY.  Dispense: 60 tablet; Refill: 5    Labs pending Health maintenance reviewed Diet and exercise encouraged Continue all meds Follow up  In 3 months    [place], FNP

## 2015-11-07 LAB — LIPID PANEL
CHOL/HDL RATIO: 3.6 ratio (ref 0.0–4.4)
CHOLESTEROL TOTAL: 196 mg/dL (ref 100–199)
HDL: 55 mg/dL (ref >39)
LDL Calculated: 115 mg/dL — ABNORMAL HIGH (ref 0–99)
TRIGLYCERIDES: 130 mg/dL (ref 0–149)
VLDL Cholesterol Cal: 26 mg/dL (ref 5–40)

## 2015-11-07 LAB — CMP14+EGFR
A/G RATIO: 1.7 (ref 1.1–2.5)
ALK PHOS: 59 IU/L (ref 39–117)
ALT: 22 IU/L (ref 0–32)
AST: 27 IU/L (ref 0–40)
Albumin: 4 g/dL (ref 3.5–4.7)
BUN/Creatinine Ratio: 15 (ref 11–26)
BUN: 12 mg/dL (ref 8–27)
Bilirubin Total: 0.6 mg/dL (ref 0.0–1.2)
CALCIUM: 9.2 mg/dL (ref 8.7–10.3)
CO2: 24 mmol/L (ref 18–29)
CREATININE: 0.82 mg/dL (ref 0.57–1.00)
Chloride: 102 mmol/L (ref 96–106)
GFR calc Af Amer: 78 mL/min/{1.73_m2} (ref >59)
GFR, EST NON AFRICAN AMERICAN: 67 mL/min/{1.73_m2} (ref >59)
Globulin, Total: 2.4 g/dL (ref 1.5–4.5)
Glucose: 91 mg/dL (ref 65–99)
POTASSIUM: 4.6 mmol/L (ref 3.5–5.2)
SODIUM: 141 mmol/L (ref 134–144)
Total Protein: 6.4 g/dL (ref 6.0–8.5)

## 2015-11-11 ENCOUNTER — Encounter: Payer: Self-pay | Admitting: *Deleted

## 2015-12-04 ENCOUNTER — Other Ambulatory Visit: Payer: Self-pay | Admitting: Nurse Practitioner

## 2015-12-10 ENCOUNTER — Ambulatory Visit (INDEPENDENT_AMBULATORY_CARE_PROVIDER_SITE_OTHER): Payer: Medicare Other | Admitting: Pharmacist

## 2015-12-10 DIAGNOSIS — I482 Chronic atrial fibrillation, unspecified: Secondary | ICD-10-CM

## 2015-12-10 DIAGNOSIS — Z7901 Long term (current) use of anticoagulants: Secondary | ICD-10-CM | POA: Diagnosis not present

## 2015-12-10 LAB — COAGUCHEK XS/INR WAIVED
INR: 2.4 — AB (ref 0.9–1.1)
PROTHROMBIN TIME: 28.5 s

## 2015-12-10 NOTE — Patient Instructions (Signed)
Anticoagulation Dose Instructions as of 12/10/2015      Dorene Grebe Tue Wed Thu Fri Sat   New Dose 3.75 mg 3.75 mg 3.75 mg 3.75 mg 3.75 mg 3.75 mg 3.75 mg    Description        Continue current warfarin dose of 7.5mg  - take 1/2 tablet daily     INR was 2.4 today

## 2016-01-20 ENCOUNTER — Ambulatory Visit (INDEPENDENT_AMBULATORY_CARE_PROVIDER_SITE_OTHER): Payer: Medicare Other | Admitting: Pharmacist

## 2016-01-20 DIAGNOSIS — Z7901 Long term (current) use of anticoagulants: Secondary | ICD-10-CM

## 2016-01-20 DIAGNOSIS — I482 Chronic atrial fibrillation, unspecified: Secondary | ICD-10-CM

## 2016-01-20 LAB — COAGUCHEK XS/INR WAIVED
INR: 2.7 — AB (ref 0.9–1.1)
PROTHROMBIN TIME: 32.9 s

## 2016-01-20 NOTE — Patient Instructions (Signed)
Anticoagulation Dose Instructions as of 01/20/2016      Melissa Knight Tue Wed Thu Fri Sat   New Dose 3.75 mg 3.75 mg 3.75 mg 3.75 mg 3.75 mg 3.75 mg 3.75 mg    Description        Continue current warfarin dose of 7.5mg  - take 1/2 tablet daily     INR was 2.7 today

## 2016-01-21 ENCOUNTER — Encounter: Payer: Self-pay | Admitting: Pharmacist

## 2016-01-21 DIAGNOSIS — H52223 Regular astigmatism, bilateral: Secondary | ICD-10-CM | POA: Diagnosis not present

## 2016-01-21 DIAGNOSIS — H5203 Hypermetropia, bilateral: Secondary | ICD-10-CM | POA: Diagnosis not present

## 2016-01-21 DIAGNOSIS — H25813 Combined forms of age-related cataract, bilateral: Secondary | ICD-10-CM | POA: Diagnosis not present

## 2016-01-21 DIAGNOSIS — H524 Presbyopia: Secondary | ICD-10-CM | POA: Diagnosis not present

## 2016-02-12 ENCOUNTER — Encounter: Payer: Self-pay | Admitting: *Deleted

## 2016-02-24 ENCOUNTER — Ambulatory Visit (INDEPENDENT_AMBULATORY_CARE_PROVIDER_SITE_OTHER): Payer: Medicare Other | Admitting: Pharmacist

## 2016-02-24 DIAGNOSIS — I482 Chronic atrial fibrillation, unspecified: Secondary | ICD-10-CM

## 2016-02-24 DIAGNOSIS — Z7901 Long term (current) use of anticoagulants: Secondary | ICD-10-CM

## 2016-02-24 LAB — PROTIME-INR: INR: 2.9 — AB (ref <=1.1)

## 2016-02-24 NOTE — Patient Instructions (Signed)
Anticoagulation Dose Instructions as of 02/24/2016      Sun Mon Tue Wed Thu Fri Sat   New Dose 3.75 mg 3.75 mg 3.75 mg 3.75 mg 3.75 mg 3.75 mg 3.75 mg    Description        No warfarin today - Wednesday, June 14th.  Then continue current warfarin dose of 7.5mg  - take 1/2 tablet daily     INR was 2.9 today

## 2016-02-25 LAB — COAGUCHEK XS/INR WAIVED
INR: 2.9 — ABNORMAL HIGH (ref 0.9–1.1)
PROTHROMBIN TIME: 34.3 s

## 2016-02-29 ENCOUNTER — Other Ambulatory Visit: Payer: Self-pay | Admitting: Nurse Practitioner

## 2016-03-25 ENCOUNTER — Other Ambulatory Visit: Payer: Self-pay | Admitting: *Deleted

## 2016-03-29 ENCOUNTER — Ambulatory Visit: Payer: Self-pay | Admitting: Nurse Practitioner

## 2016-04-04 ENCOUNTER — Other Ambulatory Visit: Payer: Self-pay

## 2016-04-04 DIAGNOSIS — G40319 Generalized idiopathic epilepsy and epileptic syndromes, intractable, without status epilepticus: Secondary | ICD-10-CM

## 2016-04-04 MED ORDER — LEVETIRACETAM 500 MG PO TABS: ORAL_TABLET | ORAL | 0 refills | Status: DC

## 2016-04-04 NOTE — Telephone Encounter (Signed)
NTBS.

## 2016-04-05 ENCOUNTER — Encounter: Payer: Self-pay | Admitting: Nurse Practitioner

## 2016-04-05 ENCOUNTER — Ambulatory Visit (INDEPENDENT_AMBULATORY_CARE_PROVIDER_SITE_OTHER): Payer: Medicare Other | Admitting: Nurse Practitioner

## 2016-04-05 VITALS — BP 144/88 | HR 71 | Temp 97.0°F | Ht 63.0 in | Wt 212.0 lb

## 2016-04-05 DIAGNOSIS — Z7901 Long term (current) use of anticoagulants: Secondary | ICD-10-CM

## 2016-04-05 DIAGNOSIS — Z6836 Body mass index (BMI) 36.0-36.9, adult: Secondary | ICD-10-CM | POA: Diagnosis not present

## 2016-04-05 DIAGNOSIS — Z8673 Personal history of transient ischemic attack (TIA), and cerebral infarction without residual deficits: Secondary | ICD-10-CM

## 2016-04-05 DIAGNOSIS — G40319 Generalized idiopathic epilepsy and epileptic syndromes, intractable, without status epilepticus: Secondary | ICD-10-CM

## 2016-04-05 DIAGNOSIS — E785 Hyperlipidemia, unspecified: Secondary | ICD-10-CM

## 2016-04-05 DIAGNOSIS — I482 Chronic atrial fibrillation, unspecified: Secondary | ICD-10-CM

## 2016-04-05 DIAGNOSIS — E8881 Metabolic syndrome: Secondary | ICD-10-CM

## 2016-04-05 DIAGNOSIS — E039 Hypothyroidism, unspecified: Secondary | ICD-10-CM

## 2016-04-05 DIAGNOSIS — I6389 Other cerebral infarction: Secondary | ICD-10-CM

## 2016-04-05 DIAGNOSIS — I10 Essential (primary) hypertension: Secondary | ICD-10-CM | POA: Diagnosis not present

## 2016-04-05 DIAGNOSIS — N393 Stress incontinence (female) (male): Secondary | ICD-10-CM | POA: Diagnosis not present

## 2016-04-05 LAB — COAGUCHEK XS/INR WAIVED
INR: 1.8 — AB (ref 0.9–1.1)
PROTHROMBIN TIME: 21.5 s

## 2016-04-05 MED ORDER — ATORVASTATIN CALCIUM 10 MG PO TABS: ORAL_TABLET | ORAL | 1 refills | Status: DC

## 2016-04-05 MED ORDER — WARFARIN SODIUM 7.5 MG PO TABS: 7.5000 mg | ORAL_TABLET | Freq: Once | ORAL | 3 refills | Status: DC

## 2016-04-05 MED ORDER — AMLODIPINE BESYLATE 2.5 MG PO TABS: 2.5000 mg | ORAL_TABLET | Freq: Every day | ORAL | 1 refills | Status: DC

## 2016-04-05 MED ORDER — FUROSEMIDE 20 MG PO TABS: ORAL_TABLET | ORAL | 1 refills | Status: DC

## 2016-04-05 MED ORDER — LEVETIRACETAM 500 MG PO TABS: ORAL_TABLET | ORAL | 0 refills | Status: DC

## 2016-04-05 MED ORDER — LISINOPRIL 20 MG PO TABS: ORAL_TABLET | ORAL | 1 refills | Status: DC

## 2016-04-05 MED ORDER — METOPROLOL TARTRATE 50 MG PO TABS: ORAL_TABLET | ORAL | 1 refills | Status: DC

## 2016-04-05 NOTE — Progress Notes (Signed)
Subjective:    Patient ID: Melissa Knight, female    DOB: 1932/09/30, 82 y.o.   MRN: [id number]  Patient here today for follow up of chronic medical problems.  Outpatient Encounter Prescriptions as of 04/05/2016  Medication Sig  . amLODipine (NORVASC) 2.5 MG tablet Take 1 tablet (2.5 mg total) by mouth daily.  Marland Kitchen atorvastatin (LIPITOR) 10 MG tablet TAKE 1 TABLET (10 MG TOTAL) BY MOUTH DAILY AT 6 PM.  . betamethasone dipropionate (DIPROLENE) 0.05 % cream Apply to affected areas as needed  . furosemide (LASIX) 20 MG tablet TAKE 1 TABLET EVERY DAY AS NEEDED  . levETIRAcetam (KEPPRA) 500 MG tablet TAKE 1 TABLET (500 MG TOTAL) BY MOUTH 2 (TWO) TIMES DAILY.  Marland Kitchen levothyroxine (SYNTHROID, LEVOTHROID) 88 MCG tablet Take 1 tablet (88 mcg total) by mouth daily.  Marland Kitchen lisinopril (PRINIVIL,ZESTRIL) 20 MG tablet TAKE 1 TABLET (20 MG TOTAL) BY MOUTH 2 (TWO) TIMES DAILY.  . metoprolol (LOPRESSOR) 50 MG tablet TAKE 2 TABLETS IN AM AND 1 AT BEDTIME  . warfarin (COUMADIN) 7.5 MG tablet Take 1 tablet (7.5 mg total) by mouth one time only at 6 PM.    Hypertension  This is a chronic problem. The current episode started more than 1 year ago. The problem is controlled. Pertinent negatives include no palpitations or shortness of breath. Risk factors for coronary artery disease include dyslipidemia, obesity and post-menopausal state. Past treatments include diuretics, ACE inhibitors and calcium channel blockers. The current treatment provides moderate improvement. Compliance problems include diet and exercise.  Hypertensive end-organ damage includes a thyroid problem.  Hyperlipidemia  This is a chronic problem. The current episode started more than 1 year ago. The problem is controlled. Exacerbating diseases include hypothyroidism and obesity. She has no history of diabetes. Pertinent negatives include no myalgias or shortness of breath. Current antihyperlipidemic treatment includes statins. The current treatment provides  moderate improvement of lipids. Compliance problems include adherence to diet and adherence to exercise.  Risk factors for coronary artery disease include dyslipidemia, family history, hypertension, obesity and post-menopausal.  Thyroid Problem  Patient reports no cold intolerance, constipation, heat intolerance, palpitations or visual change. Her past medical history is significant for hyperlipidemia. There is no history of diabetes.  atrial fib Coumadin level to be checked today CVA hx 2 years ago- no residual effects Metabolic syndrome Patient does not check blood sugars at home. tries to watch carbs in diet- no exercise Stress incontinence Has slight leak most of the time- wears pads- does not bother her that much. Seizures Only had one seizure 3 years ago when she had her stroke- she is still talking keppra. No longer sees neurologist.  Review of Systems  Respiratory: Negative for shortness of breath.   Cardiovascular: Negative for palpitations.  Gastrointestinal: Negative for constipation.  Endocrine: Negative for cold intolerance and heat intolerance.  Musculoskeletal: Negative for myalgias.  All other systems reviewed and are negative.      Objective:   Physical Exam  Constitutional: She is oriented to person, place, and time. She appears well-developed and well-nourished.  HENT:  Nose: Nose normal.  Mouth/Throat: Oropharynx is clear and moist.  Eyes: EOM are normal.  Neck: Trachea normal, normal range of motion and full passive range of motion without pain. Neck supple. No JVD present. Carotid bruit is not present. No thyromegaly present.  Cardiovascular: Normal rate, regular rhythm, normal heart sounds and intact distal pulses.  Exam reveals no gallop and no friction rub.   No murmur heard. Pulmonary/Chest: Effort  normal and breath sounds normal.  Abdominal: Soft. Bowel sounds are normal. She exhibits no distension and no mass. There is no tenderness.  Musculoskeletal:  Normal range of motion.  Lymphadenopathy:    She has no cervical adenopathy.  Neurological: She is alert and oriented to person, place, and time. She has normal reflexes.  Skin: Skin is warm and dry.  Psychiatric: She has a normal mood and affect. Her behavior is normal. Judgment and thought content normal.   BP (!) 144/88 (BP Location: Right Leg, Cuff Size: Large)   Pulse 71   Temp 97 F (36.1 C) (Oral)   Ht 5\' 3"  (1.6 m)   Wt 212 lb (96.2 kg)   BMI 37.55 kg/m   INR 1.8- see anticoag documentation     Assessment & Plan:  1. Chronic anticoagulation Results discussed with patient - CoaguChek XS/INR Waived  2. Atrial fibrillation, chronic (HCC) - CoaguChek XS/INR Waived - metoprolol (LOPRESSOR) 50 MG tablet; TAKE 2 TABLETS IN AM AND 1 AT BEDTIME  Dispense: 270 tablet; Refill: 1  3. HTN (hypertension), malignant Do not add salt to diet - lisinopril (PRINIVIL,ZESTRIL) 20 MG tablet; TAKE 1 TABLET (20 MG TOTAL) BY MOUTH 2 (TWO) TIMES DAILY.  Dispense: 180 tablet; Refill: 1 - furosemide (LASIX) 20 MG tablet; TAKE 1 TABLET EVERY DAY AS NEEDED  Dispense: 30 tablet; Refill: 1 - amLODipine (NORVASC) 2.5 MG tablet; Take 1 tablet (2.5 mg total) by mouth daily.  Dispense: 90 tablet; Refill: 1  4. Hypothyroidism (acquired)  5. 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 Seizure disorder, generalized convulsive, intractable ([place]) discussed seeing neurologist to get off keppra- patient will let me know what she wants to do aout that - levETIRAcetam (KEPPRA) 500 MG tablet; TAKE 1 TABLET (500 MG TOTAL) BY MOUTH 2 (TWO) TIMES DAILY.  Dispense: 180 tablet; Refill: 0  7. Hyperlipidemia Low fat diet - atorvastatin (LIPITOR) 10 MG tablet; TAKE 1 TABLET (10 MG TOTAL) BY MOUTH DAILY AT 6 PM.  Dispense: 90 tablet; Refill: 1  8. Metabolic syndrome  9. Stress incontinence  10. BMI 36.0-36.9,adult Discussed diet and exercise for person with BMI >25 Will recheck  weight in 3-6 months  11. Chronic atrial fibrillation (HCC) - warfarin (COUMADIN) 7.5 MG tablet; Take 1 tablet (7.5 mg total) by mouth one time only at 6 PM.  Dispense: 90 tablet; Refill: 3    Labs pending Health maintenance reviewed Diet and exercise encouraged Continue all meds Follow up  In 3 months   [place], FNP

## 2016-04-05 NOTE — Addendum Note (Signed)
Addended by: Chevis Pretty on: 04/05/2016 03:03 PM   Modules accepted: Orders

## 2016-04-05 NOTE — Patient Instructions (Signed)
Anticoagulation Dose Instructions as of 04/05/2016      Melissa Knight Tue Wed Thu Fri Sat   New Dose 3.75 mg 3.75 mg 3.75 mg 3.75 mg 3.75 mg 3.75 mg 3.75 mg    Description   Take a whole tablet today then back to 1/2 tablet daily    Follow up in 1 month

## 2016-04-06 LAB — CMP14+EGFR
A/G RATIO: 2 (ref 1.2–2.2)
ALBUMIN: 3.9 g/dL (ref 3.5–4.7)
ALK PHOS: 55 IU/L (ref 39–117)
ALT: 16 IU/L (ref 0–32)
AST: 20 IU/L (ref 0–40)
BILIRUBIN TOTAL: 0.6 mg/dL (ref 0.0–1.2)
BUN / CREAT RATIO: 20 (ref 12–28)
BUN: 12 mg/dL (ref 8–27)
CO2: 22 mmol/L (ref 18–29)
CREATININE: 0.61 mg/dL (ref 0.57–1.00)
Calcium: 8.8 mg/dL (ref 8.7–10.3)
Chloride: 101 mmol/L (ref 96–106)
GFR calc Af Amer: 98 mL/min/{1.73_m2} (ref >59)
GFR calc non Af Amer: 85 mL/min/{1.73_m2} (ref >59)
GLOBULIN, TOTAL: 2 g/dL (ref 1.5–4.5)
Glucose: 83 mg/dL (ref 65–99)
POTASSIUM: 4.1 mmol/L (ref 3.5–5.2)
SODIUM: 140 mmol/L (ref 134–144)
Total Protein: 5.9 g/dL — ABNORMAL LOW (ref 6.0–8.5)

## 2016-04-06 LAB — LIPID PANEL
CHOL/HDL RATIO: 3.4 ratio (ref 0.0–4.4)
CHOLESTEROL TOTAL: 178 mg/dL (ref 100–199)
HDL: 53 mg/dL (ref >39)
LDL CALC: 100 mg/dL — AB (ref 0–99)
TRIGLYCERIDES: 127 mg/dL (ref 0–149)
VLDL Cholesterol Cal: 25 mg/dL (ref 5–40)

## 2016-05-17 ENCOUNTER — Ambulatory Visit (INDEPENDENT_AMBULATORY_CARE_PROVIDER_SITE_OTHER): Payer: Medicare Other | Admitting: Pharmacist

## 2016-05-17 DIAGNOSIS — Z7901 Long term (current) use of anticoagulants: Secondary | ICD-10-CM

## 2016-05-17 DIAGNOSIS — I482 Chronic atrial fibrillation, unspecified: Secondary | ICD-10-CM

## 2016-05-17 LAB — COAGUCHEK XS/INR WAIVED
INR: 2.5 — AB (ref 0.9–1.1)
Prothrombin Time: 29.9 s

## 2016-05-27 ENCOUNTER — Other Ambulatory Visit: Payer: Self-pay | Admitting: Nurse Practitioner

## 2016-06-12 ENCOUNTER — Other Ambulatory Visit: Payer: Self-pay | Admitting: Nurse Practitioner

## 2016-06-12 DIAGNOSIS — I10 Essential (primary) hypertension: Secondary | ICD-10-CM

## 2016-07-07 ENCOUNTER — Encounter: Payer: Self-pay | Admitting: Nurse Practitioner

## 2016-07-07 ENCOUNTER — Ambulatory Visit (INDEPENDENT_AMBULATORY_CARE_PROVIDER_SITE_OTHER): Payer: Medicare Other | Admitting: Nurse Practitioner

## 2016-07-07 VITALS — BP 124/88 | HR 64 | Temp 96.7°F | Ht 63.0 in | Wt 210.0 lb

## 2016-07-07 DIAGNOSIS — I482 Chronic atrial fibrillation, unspecified: Secondary | ICD-10-CM

## 2016-07-07 DIAGNOSIS — E039 Hypothyroidism, unspecified: Secondary | ICD-10-CM | POA: Diagnosis not present

## 2016-07-07 DIAGNOSIS — I6389 Other cerebral infarction: Secondary | ICD-10-CM

## 2016-07-07 DIAGNOSIS — E8881 Metabolic syndrome: Secondary | ICD-10-CM | POA: Diagnosis not present

## 2016-07-07 DIAGNOSIS — N393 Stress incontinence (female) (male): Secondary | ICD-10-CM

## 2016-07-07 DIAGNOSIS — G40319 Generalized idiopathic epilepsy and epileptic syndromes, intractable, without status epilepticus: Secondary | ICD-10-CM

## 2016-07-07 DIAGNOSIS — Z7901 Long term (current) use of anticoagulants: Secondary | ICD-10-CM

## 2016-07-07 DIAGNOSIS — Z23 Encounter for immunization: Secondary | ICD-10-CM

## 2016-07-07 DIAGNOSIS — E785 Hyperlipidemia, unspecified: Secondary | ICD-10-CM

## 2016-07-07 DIAGNOSIS — I10 Essential (primary) hypertension: Secondary | ICD-10-CM

## 2016-07-07 LAB — COAGUCHEK XS/INR WAIVED
INR: 1.8 — AB (ref 0.9–1.1)
PROTHROMBIN TIME: 22.2 s

## 2016-07-07 MED ORDER — LEVOTHYROXINE SODIUM 88 MCG PO TABS: 88.0000 ug | ORAL_TABLET | Freq: Every day | ORAL | 0 refills | Status: DC

## 2016-07-07 MED ORDER — WARFARIN SODIUM 7.5 MG PO TABS: 7.5000 mg | ORAL_TABLET | Freq: Once | ORAL | 3 refills | Status: DC

## 2016-07-07 MED ORDER — AMLODIPINE BESYLATE 2.5 MG PO TABS: 2.5000 mg | ORAL_TABLET | Freq: Every day | ORAL | 1 refills | Status: DC

## 2016-07-07 MED ORDER — METOPROLOL TARTRATE 50 MG PO TABS: ORAL_TABLET | ORAL | 1 refills | Status: DC

## 2016-07-07 MED ORDER — LEVETIRACETAM 500 MG PO TABS: ORAL_TABLET | ORAL | 0 refills | Status: DC

## 2016-07-07 MED ORDER — LISINOPRIL 20 MG PO TABS: ORAL_TABLET | ORAL | 1 refills | Status: DC

## 2016-07-07 MED ORDER — ATORVASTATIN CALCIUM 10 MG PO TABS: ORAL_TABLET | ORAL | 1 refills | Status: DC

## 2016-07-07 NOTE — Addendum Note (Signed)
Addended by: Rolena Infante on: 07/07/2016 02:53 PM   Modules accepted: Orders

## 2016-07-07 NOTE — Patient Instructions (Signed)
DASH Eating Plan  DASH stands for "Dietary Approaches to Stop Hypertension." The DASH eating plan is a healthy eating plan that has been shown to reduce high blood pressure (hypertension). Additional health benefits may include reducing the risk of type 2 diabetes mellitus, heart disease, and stroke. The DASH eating plan may also help with weight loss.  WHAT DO I NEED TO KNOW ABOUT THE DASH EATING PLAN?  For the DASH eating plan, you will follow these general guidelines:  · Choose foods with a percent daily value for sodium of less than 5% (as listed on the food label).  · Use salt-free seasonings or herbs instead of table salt or sea salt.  · Check with your health care provider or pharmacist before using salt substitutes.  · Eat lower-sodium products, often labeled as "lower sodium" or "no salt added."  · Eat fresh foods.  · Eat more vegetables, fruits, and low-fat dairy products.  · Choose whole grains. Look for the word "whole" as the first word in the ingredient list.  · Choose fish and skinless chicken or turkey more often than red meat. Limit fish, poultry, and meat to 6 oz (170 g) each day.  · Limit sweets, desserts, sugars, and sugary drinks.  · Choose heart-healthy fats.  · Limit cheese to 1 oz (28 g) per day.  · Eat more home-cooked food and less restaurant, buffet, and fast food.  · Limit fried foods.  · Cook foods using methods other than frying.  · Limit canned vegetables. If you do use them, rinse them well to decrease the sodium.  · When eating at a restaurant, ask that your food be prepared with less salt, or no salt if possible.  WHAT FOODS CAN I EAT?  Seek help from a dietitian for individual calorie needs.  Grains  Whole grain or whole wheat bread. Brown rice. Whole grain or whole wheat pasta. Quinoa, bulgur, and whole grain cereals. Low-sodium cereals. Corn or whole wheat flour tortillas. Whole grain cornbread. Whole grain crackers. Low-sodium crackers.  Vegetables  Fresh or frozen vegetables  (raw, steamed, roasted, or grilled). Low-sodium or reduced-sodium tomato and vegetable juices. Low-sodium or reduced-sodium tomato sauce and paste. Low-sodium or reduced-sodium canned vegetables.   Fruits  All fresh, canned (in natural juice), or frozen fruits.  Meat and Other Protein Products  Ground beef (85% or leaner), grass-fed beef, or beef trimmed of fat. Skinless chicken or turkey. Ground chicken or turkey. Pork trimmed of fat. All fish and seafood. Eggs. Dried beans, peas, or lentils. Unsalted nuts and seeds. Unsalted canned beans.  Dairy  Low-fat dairy products, such as skim or 1% milk, 2% or reduced-fat cheeses, low-fat ricotta or cottage cheese, or plain low-fat yogurt. Low-sodium or reduced-sodium cheeses.  Fats and Oils  Tub margarines without trans fats. Light or reduced-fat mayonnaise and salad dressings (reduced sodium). Avocado. Safflower, olive, or canola oils. Natural peanut or almond butter.  Other  Unsalted popcorn and pretzels.  The items listed above may not be a complete list of recommended foods or beverages. Contact your dietitian for more options.  WHAT FOODS ARE NOT RECOMMENDED?  Grains  White bread. White pasta. White rice. Refined cornbread. Bagels and croissants. Crackers that contain trans fat.  Vegetables  Creamed or fried vegetables. Vegetables in a cheese sauce. Regular canned vegetables. Regular canned tomato sauce and paste. Regular tomato and vegetable juices.  Fruits  Dried fruits. Canned fruit in light or heavy syrup. Fruit juice.  Meat and Other Protein   Products  Fatty cuts of meat. Ribs, chicken wings, bacon, sausage, bologna, salami, chitterlings, fatback, hot dogs, bratwurst, and packaged luncheon meats. Salted nuts and seeds. Canned beans with salt.  Dairy  Whole or 2% milk, cream, half-and-half, and cream cheese. Whole-fat or sweetened yogurt. Full-fat cheeses or blue cheese. Nondairy creamers and whipped toppings. Processed cheese, cheese spreads, or cheese  curds.  Condiments  Onion and garlic salt, seasoned salt, table salt, and sea salt. Canned and packaged gravies. Worcestershire sauce. Tartar sauce. Barbecue sauce. Teriyaki sauce. Soy sauce, including reduced sodium. Steak sauce. Fish sauce. Oyster sauce. Cocktail sauce. Horseradish. Ketchup and mustard. Meat flavorings and tenderizers. Bouillon cubes. Hot sauce. Tabasco sauce. Marinades. Taco seasonings. Relishes.  Fats and Oils  Butter, stick margarine, lard, shortening, ghee, and bacon fat. Coconut, palm kernel, or palm oils. Regular salad dressings.  Other  Pickles and olives. Salted popcorn and pretzels.  The items listed above may not be a complete list of foods and beverages to avoid. Contact your dietitian for more information.  WHERE CAN I FIND MORE INFORMATION?  National [employer], [employer], [employer] Institute: www.nhlbi.nih.gov/health/health-topics/topics/dash/     This information is not intended to replace advice given to you by your health care provider. Make sure you discuss any questions you have with your health care provider.     Document Released: 08/18/2011 Document Revised: 09/19/2014 Document Reviewed: 07/03/2013  Elsevier Interactive Patient Education ©2016 [employer]

## 2016-07-07 NOTE — Progress Notes (Signed)
Subjective:    Patient ID: Melissa Knight, female    DOB: Apr 24, 1933, 82 y.o.   MRN: [id number]  Patient here today for follow up of chronic medical problems. No changes since last visit. No concerns today.  Hypertension  This is a chronic problem. The current episode started more than 1 year ago. The problem is controlled. Pertinent negatives include no palpitations or shortness of breath. Risk factors for coronary artery disease include dyslipidemia, obesity and post-menopausal state. Past treatments include diuretics, ACE inhibitors and calcium channel blockers. The current treatment provides moderate improvement. Compliance problems include diet and exercise.  Hypertensive end-organ damage includes a thyroid problem.  Hyperlipidemia  This is a chronic problem. The current episode started more than 1 year ago. The problem is controlled. Exacerbating diseases include hypothyroidism and obesity. She has no history of diabetes. Pertinent negatives include no myalgias or shortness of breath. Current antihyperlipidemic treatment includes statins. The current treatment provides moderate improvement of lipids. Compliance problems include adherence to diet and adherence to exercise.  Risk factors for coronary artery disease include dyslipidemia, family history, hypertension, obesity and post-menopausal.  Thyroid Problem  Presents for follow-up visit. Symptoms include constipation. Patient reports no cold intolerance, heat intolerance, palpitations or visual change. Her past medical history is significant for hyperlipidemia. There is no history of diabetes.  Atrial fibrillation PT/INR checked today. CVA hx 4 years ago- no residual effects Metabolic syndrome Patient does not check blood sugars at home. Tries to watch carbs in diet- no exercise Stress incontinence Has slight leak most of the time- wears pads- does not bother her that much. Seizures Only had one seizure 4 years ago when she had her stroke-  she is still talking keppra. No longer sees neurologist.  Outpatient Encounter Prescriptions as of 07/07/2016  Medication Sig  . amLODipine (NORVASC) 2.5 MG tablet Take 1 tablet (2.5 mg total) by mouth daily.  Marland Kitchen atorvastatin (LIPITOR) 10 MG tablet TAKE 1 TABLET (10 MG TOTAL) BY MOUTH DAILY AT 6 PM.  . betamethasone dipropionate (DIPROLENE) 0.05 % cream Apply to affected areas as needed  . furosemide (LASIX) 20 MG tablet TAKE 1 TABLET EVERY DAY AS NEEDED  . levETIRAcetam (KEPPRA) 500 MG tablet TAKE 1 TABLET (500 MG TOTAL) BY MOUTH 2 (TWO) TIMES DAILY.  Marland Kitchen levothyroxine (SYNTHROID, LEVOTHROID) 88 MCG tablet Take 1 tablet (88 mcg total) by mouth daily before breakfast.  . lisinopril (PRINIVIL,ZESTRIL) 20 MG tablet TAKE 1 TABLET (20 MG TOTAL) BY MOUTH 2 (TWO) TIMES DAILY.  . metoprolol (LOPRESSOR) 50 MG tablet TAKE 2 TABLETS IN AM AND 1 AT BEDTIME  . warfarin (COUMADIN) 7.5 MG tablet Take 1 tablet (7.5 mg total) by mouth one time only at 6 PM.  . [DISCONTINUED] amLODipine (NORVASC) 2.5 MG tablet Take 1 tablet (2.5 mg total) by mouth daily.  . [DISCONTINUED] atorvastatin (LIPITOR) 10 MG tablet TAKE 1 TABLET (10 MG TOTAL) BY MOUTH DAILY AT 6 PM.  . [DISCONTINUED] levETIRAcetam (KEPPRA) 500 MG tablet TAKE 1 TABLET (500 MG TOTAL) BY MOUTH 2 (TWO) TIMES DAILY.  . [DISCONTINUED] levothyroxine (SYNTHROID, LEVOTHROID) 88 MCG tablet TAKE 1 TABLET (88 MCG TOTAL) BY MOUTH DAILY.  . [DISCONTINUED] lisinopril (PRINIVIL,ZESTRIL) 20 MG tablet TAKE 1 TABLET (20 MG TOTAL) BY MOUTH 2 (TWO) TIMES DAILY.  . [DISCONTINUED] metoprolol (LOPRESSOR) 50 MG tablet TAKE 2 TABLETS IN AM AND 1 AT BEDTIME  . [DISCONTINUED] warfarin (COUMADIN) 7.5 MG tablet Take 1 tablet (7.5 mg total) by mouth one time only at 6 PM.  . [  DISCONTINUED] levothyroxine (SYNTHROID, LEVOTHROID) 88 MCG tablet Take 1 tablet (88 mcg total) by mouth daily.   No facility-administered encounter medications on file as of 07/07/2016.      Review of Systems   Constitutional: Negative.   HENT: Negative.   Eyes: Negative.   Respiratory: Negative.  Negative for shortness of breath.   Cardiovascular: Negative.  Negative for palpitations.  Gastrointestinal: Positive for constipation.  Endocrine: Negative.  Negative for cold intolerance and heat intolerance.  Genitourinary:       Gets up 2-3 x/night to urinate. Wears pad for leaking.  Musculoskeletal: Negative for myalgias.  Skin: Negative.   Neurological: Negative.   Psychiatric/Behavioral: Negative.   All other systems reviewed and are negative.      Objective:   Physical Exam  Constitutional: She is oriented to person, place, and time. She appears well-developed and well-nourished.  HENT:  Head: Normocephalic.  Right Ear: External ear normal.  Left Ear: External ear normal.  Nose: Nose normal.  Mouth/Throat: Oropharynx is clear and moist.  Eyes: Conjunctivae and EOM are normal. Pupils are equal, round, and reactive to light.  Neck: Trachea normal, normal range of motion and full passive range of motion without pain. Neck supple. No JVD present. Carotid bruit is not present. No thyromegaly present.  Cardiovascular: Normal rate and intact distal pulses.  Exam reveals no gallop and no friction rub.   No murmur heard. Pulmonary/Chest: Effort normal. She has wheezes (in right lung fields).  Abdominal: Soft. Bowel sounds are normal. She exhibits no distension and no mass. There is no tenderness.  Musculoskeletal: Normal range of motion.  Lymphadenopathy:    She has no cervical adenopathy.  Neurological: She is alert and oriented to person, place, and time. She has normal reflexes.  Skin: Skin is warm and dry.  Psychiatric: She has a normal mood and affect. Her behavior is normal. Judgment and thought content normal.   BP 124/88   Pulse 64   Temp (!) 96.7 F (35.9 C) (Oral)   Ht 5\' 3"  (1.6 m)   Wt 210 lb (95.3 kg)   BMI 37.20 kg/m   INR 1.8 down from 2.5 one mo ago - see INR  flowsheet    Assessment & Plan:  1. Atrial fibrillation, chronic (HCC) - CoaguChek XS/INR Waived - metoprolol (LOPRESSOR) 50 MG tablet; TAKE 2 TABLETS IN AM AND 1 AT BEDTIME  Dispense: 270 tablet; Refill: 1  2. Chronic anticoagulation Coumadin adjusted. Re-check INR in 2 weeks. - CoaguChek XS/INR Waived  3. Encounter for immunization - Flu Vaccine QUAD 36+ mos IM  4. HTN (hypertension), malignant Do not add salt to diet. Education on [employer] provided. - amLODipine (NORVASC) 2.5 MG tablet; Take 1 tablet (2.5 mg total) by mouth daily.  Dispense: 90 tablet; Refill: 1 - lisinopril (PRINIVIL,ZESTRIL) 20 MG tablet; TAKE 1 TABLET (20 MG TOTAL) BY MOUTH 2 (TWO) TIMES DAILY.  Dispense: 180 tablet; Refill: 1  5. Hypothyroidism (acquired) - levothyroxine (SYNTHROID, LEVOTHROID) 88 MCG tablet; Take 1 tablet (88 mcg total) by mouth daily before breakfast.  Dispense: 90 tablet; Refill: 0  6. Seizure disorder, generalized convulsive, intractable ([place]) Discussed weaning off Keppra. - levETIRAcetam (KEPPRA) 500 MG tablet; TAKE 1 TABLET (500 MG TOTAL) BY MOUTH 2 (TWO) TIMES DAILY.  Dispense: 180 tablet; Refill: 0  7. Hyperlipidemia, unspecified hyperlipidemia type Avoid fatty foods. - atorvastatin (LIPITOR) 10 MG tablet; TAKE 1 TABLET (10 MG TOTAL) BY MOUTH DAILY AT 6 PM.  Dispense: 90 tablet; Refill:  1  8. Stress incontinence  9. 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  10. Metabolic syndrome  11. Chronic atrial fibrillation (HCC) - warfarin (COUMADIN) 7.5 MG tablet; Take 1 tablet (7.5 mg total) by mouth one time only at 6 PM.  Dispense: 90 tablet; Refill: 3    Labs pending Health maintenance reviewed Diet and exercise encouraged Continue all meds Follow up in 3 months   Hendricks Limes, BSN-RN/FNP student  [place], [place]

## 2016-07-08 LAB — LIPID PANEL
CHOL/HDL RATIO: 3.6 ratio (ref 0.0–4.4)
Cholesterol, Total: 192 mg/dL (ref 100–199)
HDL: 54 mg/dL (ref >39)
LDL CALC: 104 mg/dL — AB (ref 0–99)
Triglycerides: 172 mg/dL — ABNORMAL HIGH (ref 0–149)
VLDL Cholesterol Cal: 34 mg/dL (ref 5–40)

## 2016-07-08 LAB — CMP14+EGFR
A/G RATIO: 1.7 (ref 1.2–2.2)
ALBUMIN: 4 g/dL (ref 3.5–4.7)
ALT: 20 IU/L (ref 0–32)
AST: 26 IU/L (ref 0–40)
Alkaline Phosphatase: 59 IU/L (ref 39–117)
BUN/Creatinine Ratio: 17 (ref 12–28)
BUN: 12 mg/dL (ref 8–27)
Bilirubin Total: 0.6 mg/dL (ref 0.0–1.2)
CALCIUM: 9.1 mg/dL (ref 8.7–10.3)
CO2: 24 mmol/L (ref 18–29)
CREATININE: 0.69 mg/dL (ref 0.57–1.00)
Chloride: 102 mmol/L (ref 96–106)
GFR, EST AFRICAN AMERICAN: 94 mL/min/{1.73_m2} (ref >59)
GFR, EST NON AFRICAN AMERICAN: 82 mL/min/{1.73_m2} (ref >59)
GLOBULIN, TOTAL: 2.4 g/dL (ref 1.5–4.5)
Glucose: 90 mg/dL (ref 65–99)
POTASSIUM: 4.1 mmol/L (ref 3.5–5.2)
SODIUM: 143 mmol/L (ref 134–144)
Total Protein: 6.4 g/dL (ref 6.0–8.5)

## 2016-07-20 ENCOUNTER — Ambulatory Visit (INDEPENDENT_AMBULATORY_CARE_PROVIDER_SITE_OTHER): Payer: Medicare Other | Admitting: Pharmacist

## 2016-07-20 DIAGNOSIS — I482 Chronic atrial fibrillation, unspecified: Secondary | ICD-10-CM

## 2016-07-20 DIAGNOSIS — Z7901 Long term (current) use of anticoagulants: Secondary | ICD-10-CM

## 2016-07-20 LAB — COAGUCHEK XS/INR WAIVED
INR: 2.1 — ABNORMAL HIGH (ref 0.9–1.1)
PROTHROMBIN TIME: 25.2 s

## 2016-08-17 ENCOUNTER — Ambulatory Visit (INDEPENDENT_AMBULATORY_CARE_PROVIDER_SITE_OTHER): Payer: Medicare Other | Admitting: Pharmacist

## 2016-08-17 DIAGNOSIS — I482 Chronic atrial fibrillation, unspecified: Secondary | ICD-10-CM

## 2016-08-17 DIAGNOSIS — Z7901 Long term (current) use of anticoagulants: Secondary | ICD-10-CM | POA: Diagnosis not present

## 2016-08-17 LAB — COAGUCHEK XS/INR WAIVED
INR: 1.9 — ABNORMAL HIGH (ref 0.9–1.1)
PROTHROMBIN TIME: 22.5 s

## 2016-08-17 NOTE — Patient Instructions (Signed)
Anticoagulation Dose Instructions as of 08/17/2016      Sun Mon Tue Wed Thu Fri Sat   New Dose 3.75 mg 3.75 mg 3.75 mg 3.75 mg 3.75 mg 3.75 mg 3.75 mg    Description   Take 1 tablet of warfarin 7.5mg  just today - Wednesday, December 6th.  Then continue current warfarin 7.5mg  dose of 1/2 tablet once a day.    INR was 1.9 today (goal is 2.0 to 3.0)

## 2016-08-26 ENCOUNTER — Other Ambulatory Visit: Payer: Self-pay | Admitting: Nurse Practitioner

## 2016-08-26 NOTE — Telephone Encounter (Signed)
Last TSH 04/2015

## 2016-09-21 ENCOUNTER — Ambulatory Visit (INDEPENDENT_AMBULATORY_CARE_PROVIDER_SITE_OTHER): Payer: Medicare Other | Admitting: Pharmacist

## 2016-09-21 DIAGNOSIS — I482 Chronic atrial fibrillation, unspecified: Secondary | ICD-10-CM

## 2016-09-21 DIAGNOSIS — Z7901 Long term (current) use of anticoagulants: Secondary | ICD-10-CM

## 2016-09-21 LAB — COAGUCHEK XS/INR WAIVED
INR: 2.2 — ABNORMAL HIGH (ref 0.9–1.1)
Prothrombin Time: 26.3 s

## 2016-10-05 ENCOUNTER — Encounter: Payer: Self-pay | Admitting: Physician Assistant

## 2016-10-05 ENCOUNTER — Ambulatory Visit (INDEPENDENT_AMBULATORY_CARE_PROVIDER_SITE_OTHER): Payer: Medicare Other | Admitting: Physician Assistant

## 2016-10-05 VITALS — BP 120/70 | HR 66 | Ht 63.0 in | Wt 199.4 lb

## 2016-10-05 DIAGNOSIS — I4821 Permanent atrial fibrillation: Secondary | ICD-10-CM

## 2016-10-05 DIAGNOSIS — E669 Obesity, unspecified: Secondary | ICD-10-CM | POA: Insufficient documentation

## 2016-10-05 DIAGNOSIS — I482 Chronic atrial fibrillation: Secondary | ICD-10-CM | POA: Diagnosis not present

## 2016-10-05 DIAGNOSIS — I10 Essential (primary) hypertension: Secondary | ICD-10-CM | POA: Diagnosis not present

## 2016-10-05 NOTE — Patient Instructions (Addendum)
Medication Instructions:  No changes.   Labwork: None   Testing/Procedures: None   Follow-Up: Your physician wants you to follow-up in: Dr. Sherren Mocha 1 year  You will receive a reminder letter in the mail two months in advance. If you don't receive a letter, please call our office to schedule the follow-up appointment.  Any Other Special Instructions Will Be Listed Below (If Applicable).  If you need a refill on your cardiac medications before your next appointment, please call your pharmacy.

## 2016-10-05 NOTE — Progress Notes (Signed)
Cardiology Office Note:    Date:  10/05/2016   ID:  Melissa Knight, DOB 08/31/1933, MRN [id number]  PCP:  Chevis Pretty, FNP  Cardiologist:  Dr. Sherren Mocha   Electrophysiologist:  n/a  Referring MD: Chevis Pretty, *   Chief Complaint  Patient presents with  . Follow-up    AFib    History of Present Illness:    Melissa Knight is a 83 y.o. female with a hx of permanent AFib, HTN and hypothyroidism. She was admitted to the hospital 06/2012 with a right brain stroke in the setting of atrial fibrillation which was new. It was felt that her stroke was likely cardioembolic. She was anticoagulated with Xarelto but transitioned to warfarin due to cost. Of note, carotid Dopplers were negative for ICA stenosis. Echocardiogram 06/30/12: EF 60-65%, mild MR, PASP 33.  Last seen 10/16 by me.  She returns for Cardiology follow up.  She is doing well.  The patient denies chest pain, shortness of breath, syncope, orthopnea, PND or significant pedal edema.   Prior CV studies that were reviewed today include:    Echo 10/13 EF 60-65%, mild MR, PASP 33 mmHg  Past Medical History:  Diagnosis Date  . Acute ischemic stroke ([place]) 06/2012   acute right parietal ischemic stroke - likely cardioembolic in setting of AFib => coumadin started and followed by PCP  . Atrial fibrillation ([place]) 06/2012   Echocardiogram 06/30/12: EF 60-65%, mild MR, PASP 33.  Marland Kitchen Hyperlipidemia   . Hypertension   . Hypothyroidism   . Lichen sclerosus et atrophicus of the vulva   . Seizure ([place]) 06/2012   in the setting of acute ischemic stroke    Current Medications: Current Meds  Medication Sig  . amLODipine (NORVASC) 2.5 MG tablet Take 1 tablet (2.5 mg total) by mouth daily.  Marland Kitchen atorvastatin (LIPITOR) 10 MG tablet TAKE 1 TABLET (10 MG TOTAL) BY MOUTH DAILY AT 6 PM.  . betamethasone dipropionate (DIPROLENE) 0.05 % cream APPLY TO AFFECTED AREAS AS NEEDED  . furosemide (LASIX) 20 MG tablet TAKE 1 TABLET EVERY  DAY AS NEEDED  . levETIRAcetam (KEPPRA) 500 MG tablet TAKE 1 TABLET (500 MG TOTAL) BY MOUTH 2 (TWO) TIMES DAILY.  Marland Kitchen levothyroxine (SYNTHROID, LEVOTHROID) 88 MCG tablet TAKE 1 TABLET (88 MCG TOTAL) BY MOUTH DAILY.  Marland Kitchen lisinopril (PRINIVIL,ZESTRIL) 20 MG tablet TAKE 1 TABLET (20 MG TOTAL) BY MOUTH 2 (TWO) TIMES DAILY.  . metoprolol (LOPRESSOR) 50 MG tablet TAKE 2 TABLETS IN AM AND 1 AT BEDTIME  . warfarin (COUMADIN) 7.5 MG tablet Take 1 tablet (7.5 mg total) by mouth one time only at 6 PM.     Allergies:   Sulfa antibiotics   Social History   Social History  . Marital status: Married    Spouse name: N/A  . Number of children: N/A  . Years of education: N/A   Social History Main Topics  . Smoking status: Former Smoker    Packs/day: 1.00    Types: Cigarettes    Quit date: 09/30/1978  . Smokeless tobacco: Never Used  . Alcohol use 0.6 oz/week    1 Glasses of wine per week  . Drug use: No  . Sexual activity: No   Other Topics Concern  . None   Social History Narrative  . None     ROS:   Please see the history of present illness.    Review of Systems  Gastrointestinal: Negative for hematochezia and melena.  Genitourinary: Negative for hematuria.  All other systems reviewed and are negative.   EKGs/Labs/Other Test Reviewed:    EKG:  EKG is  ordered today.  The ekg ordered today demonstrates AFib, HR 66, no change from prior tracing  Recent Labs: 07/07/2016: ALT 20; BUN 12; Creatinine, Ser 0.69; Potassium 4.1; Sodium 143   Recent Lipid Panel    Component Value Date/Time   CHOL 192 07/07/2016 1455   CHOL 181 01/30/2013 1117   TRIG 172 (H) 07/07/2016 1455   TRIG 142 01/08/2015 1124   TRIG 133 01/30/2013 1117   HDL 54 07/07/2016 1455   HDL 64 01/08/2015 1124   HDL 53 01/30/2013 1117   CHOLHDL 3.6 07/07/2016 1455   CHOLHDL 4.4 07/01/2012 0532   VLDL 35 07/01/2012 0532   LDLCALC 104 (H) 07/07/2016 1455   LDLCALC 92 06/05/2014 1104   LDLCALC 101 (H) 01/30/2013 1117       Physical Exam:    VS:  BP 120/70   Pulse 66   Ht 5\' 3"  (1.6 m)   Wt 199 lb 6.4 oz (90.4 kg)   BMI 35.32 kg/m     Wt Readings from Last 3 Encounters:  10/05/16 199 lb 6.4 oz (90.4 kg)  07/07/16 210 lb (95.3 kg)  04/05/16 212 lb (96.2 kg)     Physical Exam  Constitutional: She is oriented to person, place, and time. She appears well-developed and well-nourished. No distress.  HENT:  Head: Normocephalic and atraumatic.  Eyes: No scleral icterus.  Neck: Normal range of motion. No JVD present.  Cardiovascular: Normal rate, S1 normal, S2 normal and normal heart sounds.  An irregularly irregular rhythm present.  No murmur heard. Pulmonary/Chest: Breath sounds normal. She has no wheezes. She has no rhonchi. She has no rales.  Abdominal: Soft. There is no tenderness.  Musculoskeletal: She exhibits no edema.  Neurological: She is alert and oriented to person, place, and time.  Skin: Skin is warm and dry.  Psychiatric: She has a normal mood and affect.    ASSESSMENT:    1. Permanent atrial fibrillation ([place])   2. Essential hypertension    PLAN:    In order of problems listed above:  1. Permanent AF - Coumadin managed with PCP.  Her HR is controlled.  She is asymptomatic.  Continue strategy of rate control and anticoagulation.   2. HTN - BP controlled.  We discussed the importance of weight loss.   Medication Adjustments/Labs and Tests Ordered: Current medicines are reviewed at length with the patient today.  Concerns regarding medicines are outlined above.  Medication changes, Labs and Tests ordered today are outlined in the Patient Instructions noted below. Patient Instructions  Medication Instructions:  No changes.   Labwork: None   Testing/Procedures: None   Follow-Up: Your physician wants you to follow-up in: Dr. Sherren Mocha 1 year  You will receive a reminder letter in the mail two months in advance. If you don't receive a letter, please call our office  to schedule the follow-up appointment.  Any Other Special Instructions Will Be Listed Below (If Applicable).  If you need a refill on your cardiac medications before your next appointment, please call your pharmacy.   Signed, Richardson Dopp, PA-C  10/05/2016 10:37 AM    [facility] [place], [place], [place]  [address] Phone: [phone]; Fax: [phone]

## 2016-10-12 ENCOUNTER — Ambulatory Visit (INDEPENDENT_AMBULATORY_CARE_PROVIDER_SITE_OTHER): Payer: Medicare Other | Admitting: Nurse Practitioner

## 2016-10-12 ENCOUNTER — Encounter: Payer: Self-pay | Admitting: Nurse Practitioner

## 2016-10-12 VITALS — BP 128/86 | HR 58 | Temp 96.7°F | Ht 63.0 in | Wt 211.0 lb

## 2016-10-12 DIAGNOSIS — I638 Other cerebral infarction: Secondary | ICD-10-CM | POA: Diagnosis not present

## 2016-10-12 DIAGNOSIS — E039 Hypothyroidism, unspecified: Secondary | ICD-10-CM

## 2016-10-12 DIAGNOSIS — I4821 Permanent atrial fibrillation: Secondary | ICD-10-CM

## 2016-10-12 DIAGNOSIS — Z7901 Long term (current) use of anticoagulants: Secondary | ICD-10-CM | POA: Diagnosis not present

## 2016-10-12 DIAGNOSIS — E8881 Metabolic syndrome: Secondary | ICD-10-CM | POA: Diagnosis not present

## 2016-10-12 DIAGNOSIS — I482 Chronic atrial fibrillation, unspecified: Secondary | ICD-10-CM

## 2016-10-12 DIAGNOSIS — E785 Hyperlipidemia, unspecified: Secondary | ICD-10-CM

## 2016-10-12 DIAGNOSIS — Z6836 Body mass index (BMI) 36.0-36.9, adult: Secondary | ICD-10-CM

## 2016-10-12 DIAGNOSIS — Z6835 Body mass index (BMI) 35.0-35.9, adult: Secondary | ICD-10-CM | POA: Diagnosis not present

## 2016-10-12 DIAGNOSIS — I6389 Other cerebral infarction: Secondary | ICD-10-CM

## 2016-10-12 DIAGNOSIS — E6609 Other obesity due to excess calories: Secondary | ICD-10-CM

## 2016-10-12 DIAGNOSIS — N393 Stress incontinence (female) (male): Secondary | ICD-10-CM

## 2016-10-12 DIAGNOSIS — I10 Essential (primary) hypertension: Secondary | ICD-10-CM | POA: Diagnosis not present

## 2016-10-12 LAB — COAGUCHEK XS/INR WAIVED
INR: 2 — ABNORMAL HIGH (ref 0.9–1.1)
PROTHROMBIN TIME: 24.5 s

## 2016-10-12 NOTE — Progress Notes (Signed)
Subjective:    Patient ID: Melissa Knight, female    DOB: [birth year], 83 y.o.   MRN: [id number]  Patient here today for follow up of chronic medical problems. No changes since last visit. No concerns today.  Hypertension  This is a chronic problem. The current episode started more than 1 year ago. The problem is controlled. Pertinent negatives include no palpitations or shortness of breath. Risk factors for coronary artery disease include dyslipidemia, obesity and post-menopausal state. Past treatments include diuretics, ACE inhibitors and calcium channel blockers. The current treatment provides moderate improvement. Compliance problems include diet and exercise.  Identifiable causes of hypertension include a thyroid problem.  Hyperlipidemia  This is a chronic problem. The current episode started more than 1 year ago. The problem is controlled. Exacerbating diseases include hypothyroidism and obesity. She has no history of diabetes. Pertinent negatives include no myalgias or shortness of breath. Current antihyperlipidemic treatment includes statins. The current treatment provides moderate improvement of lipids. Compliance problems include adherence to diet and adherence to exercise.  Risk factors for coronary artery disease include dyslipidemia, family history, hypertension, obesity and post-menopausal.  Thyroid Problem  Presents for follow-up visit. Symptoms include constipation. Patient reports no cold intolerance, heat intolerance, palpitations or visual change. Her past medical history is significant for hyperlipidemia. There is no history of diabetes.  Atrial fibrillation PT/INR checked today. CVA hx 4 years ago- no residual effects Metabolic syndrome Patient does not check blood sugars at home. Tries to watch carbs in diet- no exercise Stress incontinence Has slight leak most of the time- wears pads- does not bother her that much. Seizures Only had one seizure 4 years ago when she had her  stroke- she has been weaning herself off of her Keppra- wants to continue to do that.  Outpatient Encounter Prescriptions as of 10/12/2016  Medication Sig  . amLODipine (NORVASC) 2.5 MG tablet Take 1 tablet (2.5 mg total) by mouth daily.  Marland Kitchen atorvastatin (LIPITOR) 10 MG tablet TAKE 1 TABLET (10 MG TOTAL) BY MOUTH DAILY AT 6 PM.  . betamethasone dipropionate (DIPROLENE) 0.05 % cream APPLY TO AFFECTED AREAS AS NEEDED  . furosemide (LASIX) 20 MG tablet TAKE 1 TABLET EVERY DAY AS NEEDED  . levETIRAcetam (KEPPRA) 500 MG tablet TAKE 1 TABLET (500 MG TOTAL) BY MOUTH 2 (TWO) TIMES DAILY.  Marland Kitchen levothyroxine (SYNTHROID, LEVOTHROID) 88 MCG tablet TAKE 1 TABLET (88 MCG TOTAL) BY MOUTH DAILY.  Marland Kitchen lisinopril (PRINIVIL,ZESTRIL) 20 MG tablet TAKE 1 TABLET (20 MG TOTAL) BY MOUTH 2 (TWO) TIMES DAILY.  . metoprolol (LOPRESSOR) 50 MG tablet TAKE 2 TABLETS IN AM AND 1 AT BEDTIME  . warfarin (COUMADIN) 7.5 MG tablet Take 1 tablet (7.5 mg total) by mouth one time only at 6 PM.   No facility-administered encounter medications on file as of 10/12/2016.      Review of Systems  Constitutional: Negative.   HENT: Negative.   Eyes: Negative.   Respiratory: Negative.  Negative for shortness of breath.   Cardiovascular: Negative.  Negative for palpitations.  Gastrointestinal: Positive for constipation.  Endocrine: Negative.  Negative for cold intolerance and heat intolerance.  Genitourinary:       Gets up 2-3 x/night to urinate. Wears pad for leaking.  Musculoskeletal: Negative for myalgias.  Skin: Negative.   Neurological: Negative.   Psychiatric/Behavioral: Negative.   All other systems reviewed and are negative.      Objective:   Physical Exam  Constitutional: She is oriented to person, place, and time. She appears  well-developed and well-nourished.  HENT:  Head: Normocephalic.  Right Ear: External ear normal.  Left Ear: External ear normal.  Nose: Nose normal.  Mouth/Throat: Oropharynx is clear and moist.   Eyes: Conjunctivae and EOM are normal. Pupils are equal, round, and reactive to light.  Neck: Trachea normal, normal range of motion and full passive range of motion without pain. Neck supple. No JVD present. Carotid bruit is not present. No thyromegaly present.  Cardiovascular: Normal rate and intact distal pulses.  Exam reveals no gallop and no friction rub.   No murmur heard. Pulmonary/Chest: Effort normal. She has wheezes (in right lung fields).  Abdominal: Soft. Bowel sounds are normal. She exhibits no distension and no mass. There is no tenderness.  Musculoskeletal: Normal range of motion.  Lymphadenopathy:    She has no cervical adenopathy.  Neurological: She is alert and oriented to person, place, and time. She has normal reflexes.  Skin: Skin is warm and dry.  Psychiatric: She has a normal mood and affect. Her behavior is normal. Judgment and thought content normal.   BP 128/86   Pulse (!) 58   Temp (!) 96.7 F (35.9 C) (Oral)   Ht 5\' 3"  (1.6 m)   Wt 211 lb (95.7 kg)   BMI 37.38 kg/m   INR 1.8 down from 2.5 one mo ago - see INR flowsheet    Assessment & Plan:  1. Atrial fibrillation, chronic (HCC) - CoaguChek XS/INR Waived - metoprolol (LOPRESSOR) 50 MG tablet; TAKE 2 TABLETS IN AM AND 1 AT BEDTIME  Dispense: 270 tablet; Refill: 1  2. Chronic anticoagulation Coumadin adjusted. Re-check INR in 2 weeks. - CoaguChek XS/INR Waived  3. Encounter for immunization - Flu Vaccine QUAD 36+ mos IM  4. HTN (hypertension), malignant Do not add salt to diet. Education on [employer] provided. - amLODipine (NORVASC) 2.5 MG tablet; Take 1 tablet (2.5 mg total) by mouth daily.  Dispense: 90 tablet; Refill: 1 - lisinopril (PRINIVIL,ZESTRIL) 20 MG tablet; TAKE 1 TABLET (20 MG TOTAL) BY MOUTH 2 (TWO) TIMES DAILY.  Dispense: 180 tablet; Refill: 1  5. Hypothyroidism (acquired) - levothyroxine (SYNTHROID, LEVOTHROID) 88 MCG tablet; Take 1 tablet (88 mcg total) by mouth daily before  breakfast.  Dispense: 90 tablet; Refill: 0  6. Seizure disorder, generalized convulsive, intractable ([place]) Continue  weaning off Keppra. - levETIRAcetam (KEPPRA) 500 MG tablet; TAKE 1 TABLET (500 MG TOTAL) BY MOUTH 2 (TWO) TIMES DAILY.  Dispense: 180 tablet; Refill: 0  7. Hyperlipidemia, unspecified hyperlipidemia type Avoid fatty foods. - atorvastatin (LIPITOR) 10 MG tablet; TAKE 1 TABLET (10 MG TOTAL) BY MOUTH DAILY AT 6 PM.  Dispense: 90 tablet; Refill: 1  8. Stress incontinence  9. 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  10. Metabolic syndrome  11. Chronic atrial fibrillation (HCC) - warfarin (COUMADIN) 7.5 MG tablet; Take 1 tablet (7.5 mg total) by mouth one time only at 6 PM.  Dispense: 90 tablet; Refill: 3    Labs pending Health maintenance reviewed Diet and exercise encouraged Continue all meds Follow up in 3 months   Hendricks Limes, BSN-RN/FNP student  [place], [place]

## 2016-10-12 NOTE — Patient Instructions (Signed)
Fall Prevention in the Home Introduction Falls can cause injuries. They can happen to people of all ages. There are many things you can do to make your home safe and to help prevent falls. What can I do on the outside of my home?  Regularly fix the edges of walkways and driveways and fix any cracks.  Remove anything that might make you trip as you walk through a door, such as a raised step or threshold.  Trim any bushes or trees on the path to your home.  Use bright outdoor lighting.  Clear any walking paths of anything that might make someone trip, such as rocks or tools.  Regularly check to see if handrails are loose or broken. Make sure that both sides of any steps have handrails.  Any raised decks and porches should have guardrails on the edges.  Have any leaves, snow, or ice cleared regularly.  Use sand or salt on walking paths during winter.  Clean up any spills in your garage right away. This includes oil or grease spills. What can I do in the bathroom?  Use night lights.  Install grab bars by the toilet and in the tub and shower. Do not use towel bars as grab bars.  Use non-skid mats or decals in the tub or shower.  If you need to sit down in the shower, use a plastic, non-slip stool.  Keep the floor dry. Clean up any water that spills on the floor as soon as it happens.  Remove soap buildup in the tub or shower regularly.  Attach bath mats securely with double-sided non-slip rug tape.  Do not have throw rugs and other things on the floor that can make you trip. What can I do in the bedroom?  Use night lights.  Make sure that you have a light by your bed that is easy to reach.  Do not use any sheets or blankets that are too big for your bed. They should not hang down onto the floor.  Have a firm chair that has side arms. You can use this for support while you get dressed.  Do not have throw rugs and other things on the floor that can make you trip. What can  I do in the kitchen?  Clean up any spills right away.  Avoid walking on wet floors.  Keep items that you use a lot in easy-to-reach places.  If you need to reach something above you, use a strong step stool that has a grab bar.  Keep electrical cords out of the way.  Do not use floor polish or wax that makes floors slippery. If you must use wax, use non-skid floor wax.  Do not have throw rugs and other things on the floor that can make you trip. What can I do with my stairs?  Do not leave any items on the stairs.  Make sure that there are handrails on both sides of the stairs and use them. Fix handrails that are broken or loose. Make sure that handrails are as long as the stairways.  Check any carpeting to make sure that it is firmly attached to the stairs. Fix any carpet that is loose or worn.  Avoid having throw rugs at the top or bottom of the stairs. If you do have throw rugs, attach them to the floor with carpet tape.  Make sure that you have a light switch at the top of the stairs and the bottom of the stairs. If you   do not have them, ask someone to add them for you. What else can I do to help prevent falls?  Wear shoes that:  Do not have high heels.  Have rubber bottoms.  Are comfortable and fit you well.  Are closed at the toe. Do not wear sandals.  If you use a stepladder:  Make sure that it is fully opened. Do not climb a closed stepladder.  Make sure that both sides of the stepladder are locked into place.  Ask someone to hold it for you, if possible.  Clearly mark and make sure that you can see:  Any grab bars or handrails.  First and last steps.  Where the edge of each step is.  Use tools that help you move around (mobility aids) if they are needed. These include:  Canes.  Walkers.  Scooters.  Crutches.  Turn on the lights when you go into a dark area. Replace any light bulbs as soon as they burn out.  Set up your furniture so you have a  clear path. Avoid moving your furniture around.  If any of your floors are uneven, fix them.  If there are any pets around you, be aware of where they are.  Review your medicines with your doctor. Some medicines can make you feel dizzy. This can increase your chance of falling. Ask your doctor what other things that you can do to help prevent falls. This information is not intended to replace advice given to you by your health care provider. Make sure you discuss any questions you have with your health care provider. Document Released: 06/25/2009 Document Revised: 02/04/2016 Document Reviewed: 10/03/2014  2017 [employer]  

## 2016-10-13 LAB — LIPID PANEL
Chol/HDL Ratio: 3.5 ratio units (ref 0.0–4.4)
Cholesterol, Total: 180 mg/dL (ref 100–199)
HDL: 51 mg/dL (ref >39)
LDL Calculated: 95 mg/dL (ref 0–99)
Triglycerides: 168 mg/dL — ABNORMAL HIGH (ref 0–149)
VLDL Cholesterol Cal: 34 mg/dL (ref 5–40)

## 2016-10-13 LAB — THYROID PANEL WITH TSH
FREE THYROXINE INDEX: 2.9 (ref 1.2–4.9)
T3 UPTAKE RATIO: 28 % (ref 24–39)
T4, Total: 10.4 ug/dL (ref 4.5–12.0)
TSH: 2.05 u[IU]/mL (ref 0.450–4.500)

## 2016-10-13 LAB — CMP14+EGFR
A/G RATIO: 1.8 (ref 1.2–2.2)
ALBUMIN: 3.9 g/dL (ref 3.5–4.7)
ALK PHOS: 56 IU/L (ref 39–117)
ALT: 18 IU/L (ref 0–32)
AST: 23 IU/L (ref 0–40)
BILIRUBIN TOTAL: 0.6 mg/dL (ref 0.0–1.2)
BUN / CREAT RATIO: 17 (ref 12–28)
BUN: 11 mg/dL (ref 8–27)
CHLORIDE: 103 mmol/L (ref 96–106)
CO2: 22 mmol/L (ref 18–29)
Calcium: 9.1 mg/dL (ref 8.7–10.3)
Creatinine, Ser: 0.64 mg/dL (ref 0.57–1.00)
GFR calc non Af Amer: 84 mL/min/{1.73_m2} (ref >59)
GFR, EST AFRICAN AMERICAN: 97 mL/min/{1.73_m2} (ref >59)
Globulin, Total: 2.2 g/dL (ref 1.5–4.5)
Glucose: 98 mg/dL (ref 65–99)
POTASSIUM: 4.4 mmol/L (ref 3.5–5.2)
Sodium: 142 mmol/L (ref 134–144)
TOTAL PROTEIN: 6.1 g/dL (ref 6.0–8.5)

## 2016-11-05 ENCOUNTER — Other Ambulatory Visit: Payer: Self-pay | Admitting: Nurse Practitioner

## 2016-11-05 DIAGNOSIS — G40319 Generalized idiopathic epilepsy and epileptic syndromes, intractable, without status epilepticus: Secondary | ICD-10-CM

## 2016-11-08 NOTE — Telephone Encounter (Signed)
Patient aware and verbalizes understanding. 

## 2016-11-08 NOTE — Telephone Encounter (Signed)
Last refill without being seen 

## 2016-11-10 ENCOUNTER — Encounter: Payer: Self-pay | Admitting: Pharmacist

## 2016-11-10 ENCOUNTER — Ambulatory Visit (INDEPENDENT_AMBULATORY_CARE_PROVIDER_SITE_OTHER): Payer: Medicare Other | Admitting: Pharmacist

## 2016-11-10 VITALS — BP 122/84 | HR 66 | Ht 63.0 in | Wt 209.5 lb

## 2016-11-10 DIAGNOSIS — I482 Chronic atrial fibrillation, unspecified: Secondary | ICD-10-CM

## 2016-11-10 DIAGNOSIS — Z0000 Encounter for general adult medical examination without abnormal findings: Secondary | ICD-10-CM | POA: Diagnosis not present

## 2016-11-10 DIAGNOSIS — Z7901 Long term (current) use of anticoagulants: Secondary | ICD-10-CM

## 2016-11-10 LAB — COAGUCHEK XS/INR WAIVED
INR: 2.4 — AB (ref 0.9–1.1)
PROTHROMBIN TIME: 29 s

## 2016-11-10 NOTE — Patient Instructions (Addendum)
  Ms. Lasswell , Thank you for taking time to come for your Medicare Wellness Visit. I appreciate your ongoing commitment to your health goals. Please review the following plan we discussed and let me know if I can assist you in the future.   These are the goals we discussed: Start exercise - chair exercises or walk - goal is to work up to 150  Minutes per week   Increase non-starchy vegetables - carrots, green bean, squash, zucchini, tomatoes, onions, peppers, spinach and other green leafy vegetables, cabbage, lettuce, cucumbers, asparagus, okra (not fried), eggplant Limit sugar and processed foods (cakes, cookies, ice cream, crackers and chips) Increase fresh fruit but limit serving sizes 1/2 cup or about the size of tennis or baseball Limit red meat to no more than 1-2 times per week (serving size about the size of your palm) Choose whole grains / lean proteins - whole wheat bread, quinoa, whole grain rice (1/2 cup), fish, chicken, [place] Avoid sugar and calorie containing beverages - soda, sweet tea and juice.  Choose water or unsweetened tea instead.    This is a list of the screening recommended for you and due dates:  Health Maintenance  Topic Date Due      . Tetanus Vaccine  07/07/2017*  . DEXA scan (bone density measurement)  01/02/2019  . Flu Shot  Completed  . Pneumonia vaccines  Completed  *Topic was postponed. The date shown is not the original due date.

## 2016-11-11 ENCOUNTER — Encounter: Payer: Self-pay | Admitting: Pharmacist

## 2016-11-11 NOTE — Progress Notes (Signed)
Patient ID: Melissa Knight, female   DOB: [birth year], 83 y.o.   MRN: [id number]     Subjective:   Melissa Knight is a 83 y.o. female who presents for a subsequent Medicare Annual Wellness Visit and to have INR rechecked.    Melissa Knight has atrial fibrillation and is taking warfarin 7.5mg  tablets - 1/2 tablet daily She denies and unusual bleeding or bruising.  No s/s of thromboembolism.  No misses doses and not changes in diet or medication over the last 4 weeks.  Melissa Knight mentions that she wears incontinence pads daily and gets up frequently at night to urinate.  She has tried Myrbetriq in the past without much improvement.  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 Marital history:  Married Has 3 children  Alcohol/Tobacco/Substances: no    Current Medications (verified) Outpatient Encounter Prescriptions as of 11/10/2016  Medication Sig  . amLODipine (NORVASC) 2.5 MG tablet Take 1 tablet (2.5 mg total) by mouth daily.  Marland Kitchen atorvastatin (LIPITOR) 10 MG tablet TAKE 1 TABLET (10 MG TOTAL) BY MOUTH DAILY AT 6 PM.  . furosemide (LASIX) 20 MG tablet TAKE 1 TABLET EVERY DAY AS NEEDED  . levETIRAcetam (KEPPRA) 500 MG tablet TAKE 1 TABLET (500 MG TOTAL) BY MOUTH 2 (TWO) TIMES DAILY. (Patient taking differently: take 1 tablet daily)  . levothyroxine (SYNTHROID, LEVOTHROID) 88 MCG tablet TAKE 1 TABLET (88 MCG TOTAL) BY MOUTH DAILY.  Marland Kitchen lisinopril (PRINIVIL,ZESTRIL) 20 MG tablet TAKE 1 TABLET (20 MG TOTAL) BY MOUTH 2 (TWO) TIMES DAILY.  . metoprolol (LOPRESSOR) 50 MG tablet TAKE 2 TABLETS IN AM AND 1 AT BEDTIME  . warfarin (COUMADIN) 7.5 MG tablet Take 1 tablet (7.5 mg total) by mouth one  time only at 6 PM. (Patient taking differently: Take 3.75 mg by mouth one time only at 6 PM. )  . betamethasone dipropionate (DIPROLENE) 0.05 % cream APPLY TO AFFECTED AREAS AS NEEDED (Patient not taking: Reported on 11/10/2016)   No facility-administered encounter medications on file as of 11/10/2016.     Allergies (verified) Sulfa antibiotics   History: Past Medical History:  Diagnosis Date  . Acute ischemic stroke ([place]) 06/2012   acute right parietal ischemic stroke - likely cardioembolic in setting of AFib => coumadin started and followed by PCP  . Atrial fibrillation ([place]) 06/2012   Echocardiogram 06/30/12: EF 60-65%, mild MR, PASP 33.  Marland Kitchen Hyperlipidemia   . Hypertension   . Hypothyroidism   . Lichen sclerosus et atrophicus of the vulva   . Seizure ([place]) 06/2012   in the setting of acute ischemic stroke   Past Surgical History:  Procedure Laterality Date  . BLADDER SURGERY     bladder prolapse  . TUBAL LIGATION     Family History  Problem Relation Age of Onset  . Atrial fibrillation Mother     pacemaker  . Hypertension Mother   . Heart disease Sister     MI in 29s  . Stroke Sister   . Heart disease Father   . Heart disease Sister   . Heart disease Sister   . Hypertension Sister   . Anxiety disorder Sister   . Heart disease Brother   . Cancer Brother  pancreatic   Social History   Occupational History  . Not on file.   Social History Main Topics  . Smoking status: Former Smoker    Packs/day: 1.00    Types: Cigarettes    Quit date: 09/30/1978  . Smokeless tobacco: Never Used  . Alcohol use No  . Drug use: No  . Sexual activity: No    Do you feel safe at home?  Yes Are there smokers in your home (other than you)? No  Dietary issues and exercise activities: Current Exercise Habits: The patient does not participate in regular exercise at present, Exercise limited by: orthopedic condition(s)  Current Dietary habits:  No soda or sweet tea.   Melissa Knight  does states that she is a "sugar addict" and eats doughnuts and ice cream frequently.  Usually 3 meals per day and 1-2 snacks daily.     Objective:    Today's Vitals   11/10/16 1046  BP: 122/84  Pulse: 66  Weight: 209 lb 8 oz (95 kg)  Height: 5\' 3"  (1.6 m)  PainSc: 2   PainLoc: Hip   Body mass index is 37.11 kg/m.  INR was 2.4 today Activities of Daily Living In your present state of health, do you have any difficulty performing the following activities: 11/10/2016  Hearing? N  Vision? N  Difficulty concentrating or making decisions? N  Walking or climbing stairs? Y  Dressing or bathing? N  Doing errands, shopping? N  Preparing Food and eating ? N  Using the Toilet? N  In the past six months, have you accidently leaked urine? Y  Do you have problems with loss of bowel control? N  Managing your Medications? N  Managing your Finances? N  Housekeeping or managing your Housekeeping? N  Some recent data might be hidden     Cardiac Risk Factors include: advanced age (>33men, >26 women);dyslipidemia;obesity (BMI >30kg/m2);sedentary lifestyle  Depression Screen PHQ 2/9 Scores 11/10/2016 10/12/2016 07/07/2016 04/05/2016  PHQ - 2 Score 0 0 0 0     Fall Risk Fall Risk  11/10/2016 10/12/2016 07/07/2016 04/05/2016 11/06/2015  Falls in the past year? No No No No No    Cognitive Function: MMSE - Mini Mental State Exam 11/10/2016 10/12/2015  Orientation to time 5 5  Orientation to Place 5 5  Registration 3 3  Attention/ Calculation 5 5  Recall 3 3  Language- name 2 objects 2 2  Language- repeat 1 1  Language- follow 3 step command 3 3  Language- read & follow direction 1 1  Write a sentence 1 1  Copy design 1 1  Total score 30 30    Immunizations and Health Maintenance Immunization History  Administered Date(s) Administered  . Influenza Split 07/03/2012  . Influenza,inj,Quad PF,36+ Mos 09/19/2013, 07/10/2014, 06/25/2015, 07/07/2016  . Pneumococcal Conjugate-13 07/10/2014  .  Pneumococcal Polysaccharide-23 12/28/2010  . Zoster 01/08/2015   Health Maintenance Due  Topic Date Due  . MAMMOGRAM  12/03/2014    Patient Care Team: Chevis Pretty, FNP as PCP - General (Nurse Practitioner) Sherren Mocha, MD as Consulting Physician (Cardiology) Liliane Shi, PA-C as Physician Assistant (Physician Assistant) Truc Manus Gunning, OD as Consulting Physician (Optometry)  Indicate any recent Medical Services you may have received from other than [facility] providers in the past year (date may be approximate).    Assessment:    Annual Wellness Visit  Therapeutic Anticoagulation Urinary incontinence   Screening Tests Health Maintenance  Topic Date Due  . MAMMOGRAM  12/03/2014  .  TETANUS/TDAP  07/07/2017 (Originally 10/16/1951)  . DEXA SCAN  01/02/2019  . INFLUENZA VACCINE  Completed  . PNA vac Low Risk Adult  Completed        Plan:   During the course of the visit Tanay was educated and counseled about the following appropriate screening and preventive services:   Vaccines to include Pneumoccal, Influenza, Td, Zostavax - patient is due to have Tdap but she wanted to postpone.  She was given information about Tdap / Boostrix and will ask again at [facility] appt next month  Colorectal cancer screening - patient declined colonoscopy at home and reminded to [place].  She declined colonoscopy and is past age of recommended continued colonscopies  Cardiovascular disease screening - EKG is UTD  Lipids - LDL is at goal but Tg elevated - discussed diet/ Patient to continue atorvastatin  BP - at goal   Diabetes screening - UTD and WNL  Bone Denisty / Osteoporosis Screening - Last DEXA was 2015 - has had 2 normal DEXA  Mammogram - patient declined future mammograms - reminded to continue self breast exams  Glaucoma screening /  Eye Exam - UTD  Nutrition counseling - Discussed limiting sugar, processed foods and calories.  Discussed decreasing weight - goal is to  decrease by 10% over the next 6 months.  Advanced Directives - information discussed and provided  Physical Activity - recommended physical therapy consult but patient declined.  She is encouraged to increase physcial with chair exercises and walking - work up to 150 minutes per week as able.  Continue current warfarin dose - recheck INR in 4-6 weeks  Patient Instructions (the written plan) were given to the patient.   Cherre Robins, PharmD   11/11/2016

## 2016-11-24 ENCOUNTER — Other Ambulatory Visit: Payer: Self-pay | Admitting: Nurse Practitioner

## 2016-12-21 ENCOUNTER — Encounter: Payer: Self-pay | Admitting: Pharmacist

## 2016-12-27 ENCOUNTER — Emergency Department (HOSPITAL_COMMUNITY)
Admission: EM | Admit: 2016-12-27 | Discharge: 2016-12-27 | Disposition: A | Discharge status: Home / Self Care | Payer: Medicare Other | Attending: Emergency Medicine | Admitting: Emergency Medicine

## 2016-12-27 ENCOUNTER — Encounter (HOSPITAL_COMMUNITY): Payer: Self-pay

## 2016-12-27 ENCOUNTER — Emergency Department (HOSPITAL_COMMUNITY): Payer: Medicare Other

## 2016-12-27 DIAGNOSIS — Z79899 Other long term (current) drug therapy: Secondary | ICD-10-CM | POA: Insufficient documentation

## 2016-12-27 DIAGNOSIS — Z8673 Personal history of transient ischemic attack (TIA), and cerebral infarction without residual deficits: Secondary | ICD-10-CM | POA: Insufficient documentation

## 2016-12-27 DIAGNOSIS — S8992XA Unspecified injury of left lower leg, initial encounter: Secondary | ICD-10-CM | POA: Diagnosis present

## 2016-12-27 DIAGNOSIS — Z87891 Personal history of nicotine dependence: Secondary | ICD-10-CM | POA: Insufficient documentation

## 2016-12-27 DIAGNOSIS — Y999 Unspecified external cause status: Secondary | ICD-10-CM | POA: Insufficient documentation

## 2016-12-27 DIAGNOSIS — Y9389 Activity, other specified: Secondary | ICD-10-CM | POA: Diagnosis not present

## 2016-12-27 DIAGNOSIS — S8012XA Contusion of left lower leg, initial encounter: Secondary | ICD-10-CM | POA: Insufficient documentation

## 2016-12-27 DIAGNOSIS — I10 Essential (primary) hypertension: Secondary | ICD-10-CM | POA: Diagnosis not present

## 2016-12-27 DIAGNOSIS — E039 Hypothyroidism, unspecified: Secondary | ICD-10-CM | POA: Insufficient documentation

## 2016-12-27 DIAGNOSIS — Z7901 Long term (current) use of anticoagulants: Secondary | ICD-10-CM | POA: Diagnosis not present

## 2016-12-27 DIAGNOSIS — S99921A Unspecified injury of right foot, initial encounter: Secondary | ICD-10-CM | POA: Diagnosis not present

## 2016-12-27 DIAGNOSIS — L97901 Non-pressure chronic ulcer of unspecified part of unspecified lower leg limited to breakdown of skin: Secondary | ICD-10-CM | POA: Diagnosis not present

## 2016-12-27 DIAGNOSIS — S8011XA Contusion of right lower leg, initial encounter: Secondary | ICD-10-CM | POA: Diagnosis not present

## 2016-12-27 DIAGNOSIS — M25571 Pain in right ankle and joints of right foot: Secondary | ICD-10-CM | POA: Diagnosis not present

## 2016-12-27 DIAGNOSIS — L98499 Non-pressure chronic ulcer of skin of other sites with unspecified severity: Secondary | ICD-10-CM | POA: Diagnosis not present

## 2016-12-27 DIAGNOSIS — M7989 Other specified soft tissue disorders: Secondary | ICD-10-CM | POA: Diagnosis not present

## 2016-12-27 DIAGNOSIS — L98491 Non-pressure chronic ulcer of skin of other sites limited to breakdown of skin: Secondary | ICD-10-CM

## 2016-12-27 DIAGNOSIS — W19XXXA Unspecified fall, initial encounter: Secondary | ICD-10-CM

## 2016-12-27 DIAGNOSIS — M79671 Pain in right foot: Secondary | ICD-10-CM | POA: Diagnosis not present

## 2016-12-27 DIAGNOSIS — W109XXA Fall (on) (from) unspecified stairs and steps, initial encounter: Secondary | ICD-10-CM | POA: Diagnosis not present

## 2016-12-27 DIAGNOSIS — M79605 Pain in left leg: Secondary | ICD-10-CM | POA: Diagnosis not present

## 2016-12-27 DIAGNOSIS — Y92009 Unspecified place in unspecified non-institutional (private) residence as the place of occurrence of the external cause: Secondary | ICD-10-CM | POA: Diagnosis not present

## 2016-12-27 DIAGNOSIS — S99911A Unspecified injury of right ankle, initial encounter: Secondary | ICD-10-CM | POA: Diagnosis not present

## 2016-12-27 LAB — COMPREHENSIVE METABOLIC PANEL
ALBUMIN: 3.6 g/dL (ref 3.5–5.0)
ALT: 20 U/L (ref 14–54)
AST: 41 U/L (ref 15–41)
Alkaline Phosphatase: 56 U/L (ref 38–126)
Anion gap: 12 (ref 5–15)
BILIRUBIN TOTAL: 1.8 mg/dL — AB (ref 0.3–1.2)
BUN: 14 mg/dL (ref 6–20)
CALCIUM: 9 mg/dL (ref 8.9–10.3)
CO2: 24 mmol/L (ref 22–32)
Chloride: 103 mmol/L (ref 101–111)
Creatinine, Ser: 0.74 mg/dL (ref 0.44–1.00)
GFR calc non Af Amer: >60 mL/min (ref >60)
GLUCOSE: 127 mg/dL — AB (ref 65–99)
POTASSIUM: 4.4 mmol/L (ref 3.5–5.1)
SODIUM: 139 mmol/L (ref 135–145)
Total Protein: 6.2 g/dL — ABNORMAL LOW (ref 6.5–8.1)

## 2016-12-27 LAB — CBC WITH DIFFERENTIAL/PLATELET
BASOS PCT: 0 %
Basophils Absolute: 0 10*3/uL (ref 0.0–0.1)
EOS ABS: 0.1 10*3/uL (ref 0.0–0.7)
EOS PCT: 1 %
HCT: 40 % (ref 36.0–46.0)
Hemoglobin: 13.3 g/dL (ref 12.0–15.0)
Lymphocytes Relative: 15 %
Lymphs Abs: 1.9 10*3/uL (ref 0.7–4.0)
MCH: 30.4 pg (ref 26.0–34.0)
MCHC: 33.3 g/dL (ref 30.0–36.0)
MCV: 91.3 fL (ref 78.0–100.0)
MONO ABS: 1.3 10*3/uL — AB (ref 0.1–1.0)
MONOS PCT: 11 %
Neutro Abs: 8.9 10*3/uL — ABNORMAL HIGH (ref 1.7–7.7)
Neutrophils Relative %: 73 %
Platelets: 206 10*3/uL (ref 150–400)
RBC: 4.38 MIL/uL (ref 3.87–5.11)
RDW: 13.2 % (ref 11.5–15.5)
WBC: 12.3 10*3/uL — ABNORMAL HIGH (ref 4.0–10.5)

## 2016-12-27 LAB — I-STAT CG4 LACTIC ACID, ED: Lactic Acid, Venous: 1.84 mmol/L (ref 0.5–1.9)

## 2016-12-27 IMAGING — DX DG TIBIA/FIBULA 2V*L*
2 series · 2 of 2 positions shown · non-contrast
Comparison: None.

CLINICAL DATA: Left lower extremity bruising and infection. Fall
last week down 5 steps.

EXAM:
LEFT TIBIA AND FIBULA - 2 VIEW

[tibia ap]
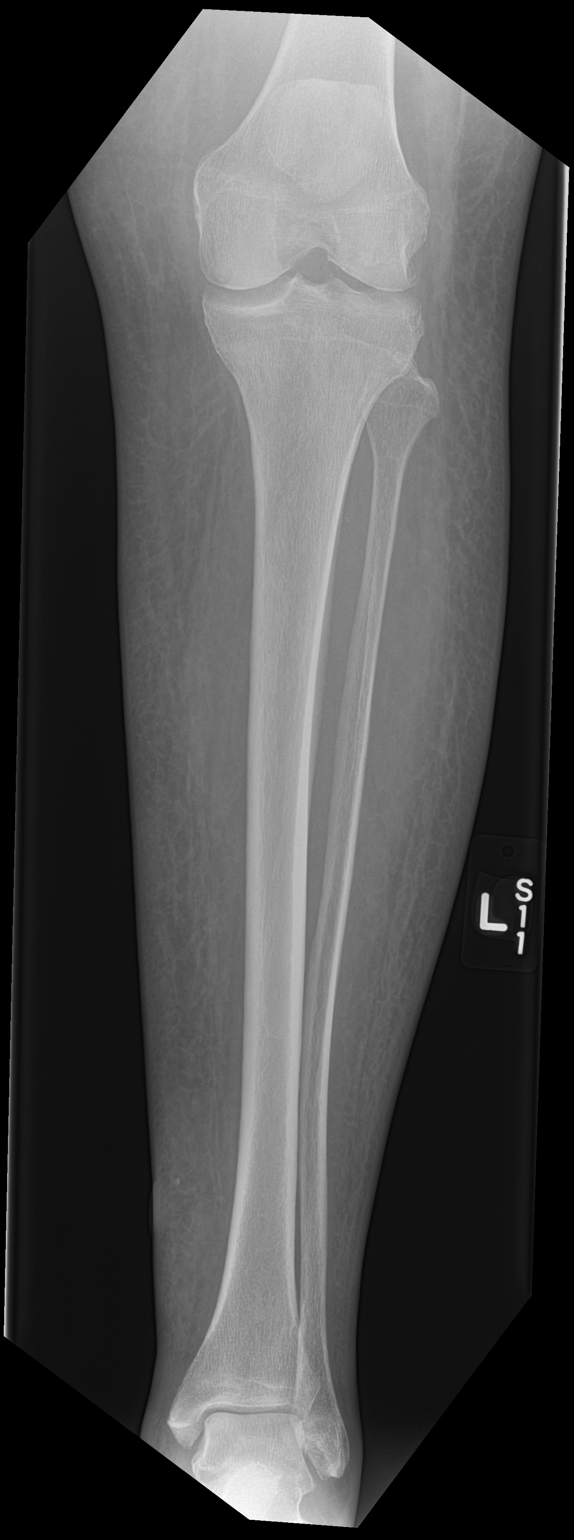

[tibia lat]
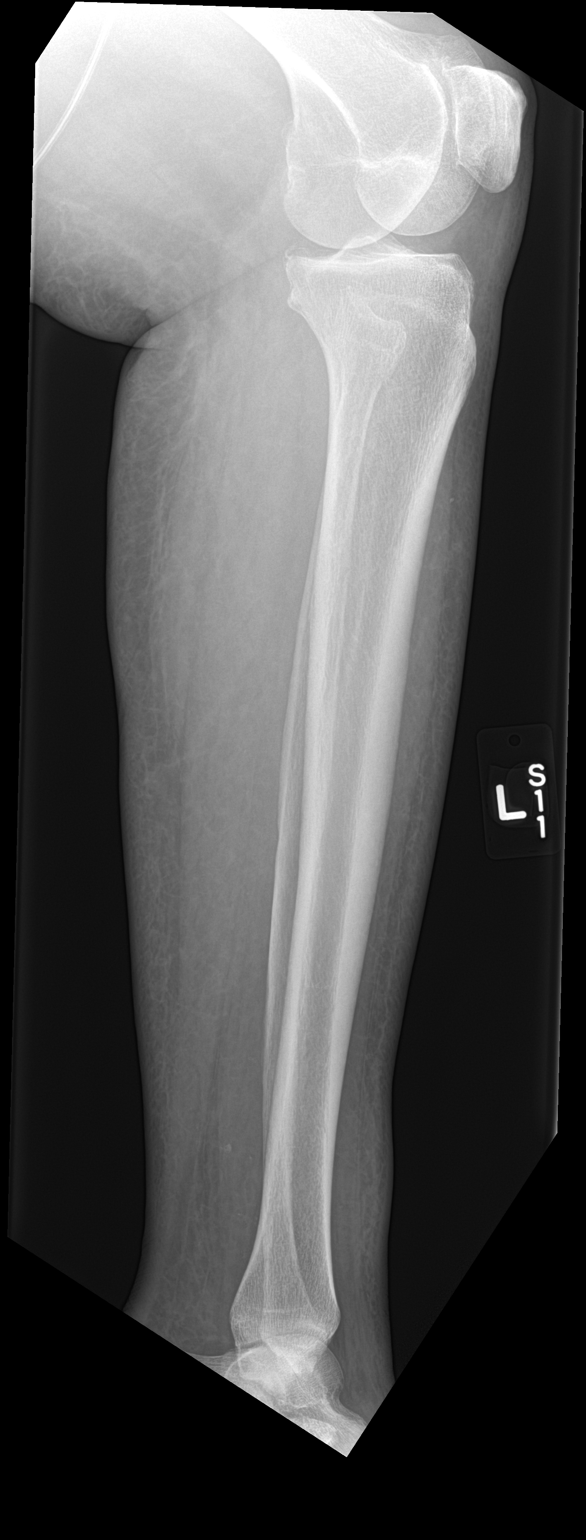

[2 of 2 positions shown; findings below may reference images not displayed]

FINDINGS: Mild degenerative changes over the knee. No evidence of acute
fracture or dislocation. Mild subcutaneous edema.
IMPRESSION: No acute fracture.

## 2016-12-27 MED ORDER — HYDROCODONE-ACETAMINOPHEN 5-325 MG PO TABS: 1.0000 | ORAL_TABLET | ORAL | 0 refills | Status: DC | PRN

## 2016-12-27 MED ORDER — CEFTRIAXONE SODIUM 1 G IJ SOLR
1.0000 g | Freq: Once | INTRAMUSCULAR | Status: AC
  Administered 2016-12-27: 1 g via INTRAMUSCULAR
  Filled 2016-12-27: qty 10

## 2016-12-27 MED ORDER — STERILE WATER FOR INJECTION IJ SOLN
INTRAMUSCULAR | Status: AC
  Administered 2016-12-27: 2.1 mL
  Filled 2016-12-27: qty 10

## 2016-12-27 MED ORDER — CEPHALEXIN 500 MG PO CAPS: 500.0000 mg | ORAL_CAPSULE | Freq: Three times a day (TID) | ORAL | 0 refills | Status: AC

## 2016-12-27 NOTE — ED Provider Notes (Signed)
[facility] DEPT Provider Note   CSN: [id number] Arrival date & time: 12/27/16  1424     History   Chief Complaint Chief Complaint  Patient presents with  . Leg Injury    HPI Melissa Knight is a 83 y.o. female.  The history is provided by the patient.  Leg Pain   This is a new problem. The current episode started more than 1 week ago. The problem occurs constantly. The problem has been gradually improving. The pain is present in the left lower leg. The quality of the pain is described as aching. The pain is at a severity of 5/10. The pain is moderate. Associated symptoms include stiffness. Pertinent negatives include no numbness, full range of motion, no tingling and no itching. The symptoms are aggravated by standing and contact. She has tried OTC pain medications for the symptoms. The treatment provided mild relief. There has been a history of trauma (Pt feel on back porch slipping on ice on 4/8 without LOC or striking head).    Past Medical History:  Diagnosis Date  . Acute ischemic stroke ([place]) 06/2012   acute right parietal ischemic stroke - likely cardioembolic in setting of AFib => coumadin started and followed by PCP  . Atrial fibrillation ([place]) 06/2012   Echocardiogram 06/30/12: EF 60-65%, mild MR, PASP 33.  Marland Kitchen Hyperlipidemia   . Hypertension   . Hypothyroidism   . Lichen sclerosus et atrophicus of the vulva   . Seizure ([place]) 06/2012   in the setting of acute ischemic stroke    Patient Active Problem List   Diagnosis Date Noted  . Obesity 10/05/2016  . BMI 36.0-36.9,adult 04/17/2015  . Lichen sclerosus of female genitalia 10/10/2014  . Stress incontinence 10/10/2014  . Hypothyroidism (acquired) 06/05/2014  . Metabolic syndrome 37/90/2409  . Hyperlipidemia 02/05/2013  . Permanent atrial fibrillation ([place]) 07/02/2012  . Cerebral infarction ([place]) 06/30/2012  . Seizure disorder, generalized convulsive, intractable ([place]) 06/30/2012  . Essential hypertension  06/30/2012    Past Surgical History:  Procedure Laterality Date  . BLADDER SURGERY     bladder prolapse  . TUBAL LIGATION      OB History    No data available       Home Medications    Prior to Admission medications   Medication Sig Start Date End Date Taking? Authorizing Provider  amLODipine (NORVASC) 2.5 MG tablet Take 1 tablet (2.5 mg total) by mouth daily. 07/07/16   Mary-Margaret Hassell Done, FNP  atorvastatin (LIPITOR) 10 MG tablet TAKE 1 TABLET (10 MG TOTAL) BY MOUTH DAILY AT 6 PM. 07/07/16   Mary-Margaret Hassell Done, FNP  betamethasone dipropionate (DIPROLENE) 0.05 % cream APPLY TO AFFECTED AREAS AS NEEDED Patient not taking: Reported on 11/10/2016 08/26/16   Mary-Margaret Hassell Done, FNP  cephALEXin (KEFLEX) 500 MG capsule Take 1 capsule (500 mg total) by mouth 3 (three) times daily. 12/27/16 01/06/17  Esaw Grandchild, MD  furosemide (LASIX) 20 MG tablet TAKE 1 TABLET EVERY DAY AS NEEDED 06/13/16   Mary-Margaret Hassell Done, FNP  HYDROcodone-acetaminophen (NORCO/VICODIN) 5-325 MG tablet Take 1 tablet by mouth every 4 (four) hours as needed. 12/27/16   Esaw Grandchild, MD  levETIRAcetam (KEPPRA) 500 MG tablet TAKE 1 TABLET (500 MG TOTAL) BY MOUTH 2 (TWO) TIMES DAILY. Patient taking differently: take 1 tablet daily 11/08/16   Mary-Margaret Hassell Done, FNP  levothyroxine (SYNTHROID, LEVOTHROID) 88 MCG tablet TAKE 1 TABLET (88 MCG TOTAL) BY MOUTH DAILY. 11/24/16   Mary-Margaret Hassell Done, FNP  lisinopril (PRINIVIL,ZESTRIL) 20 MG tablet TAKE 1 TABLET (  20 MG TOTAL) BY MOUTH 2 (TWO) TIMES DAILY. 07/07/16   Mary-Margaret Hassell Done, FNP  metoprolol (LOPRESSOR) 50 MG tablet TAKE 2 TABLETS IN AM AND 1 AT BEDTIME 07/07/16   Mary-Margaret Hassell Done, FNP  warfarin (COUMADIN) 7.5 MG tablet Take 1 tablet (7.5 mg total) by mouth one time only at 6 PM. Patient taking differently: Take 3.75 mg by mouth one time only at 6 PM.  07/07/16   Mary-Margaret Hassell Done, FNP    Family History Family History  Problem Relation Age of Onset  .  Atrial fibrillation Mother     pacemaker  . Hypertension Mother   . Heart disease Sister     MI in 44s  . Stroke Sister   . Heart disease Father   . Heart disease Sister   . Heart disease Sister   . Hypertension Sister   . Anxiety disorder Sister   . Heart disease Brother   . Cancer Brother     pancreatic    Social History Social History  Substance Use Topics  . Smoking status: Former Smoker    Packs/day: 1.00    Types: Cigarettes    Quit date: 09/30/1978  . Smokeless tobacco: Never Used  . Alcohol use No     Allergies   Sulfa antibiotics   Review of Systems Review of Systems  Constitutional: Negative for chills and fever.  HENT: Negative for ear pain and sore throat.   Eyes: Negative for pain and visual disturbance.  Respiratory: Negative for cough and shortness of breath.   Cardiovascular: Negative for chest pain and palpitations.  Gastrointestinal: Negative for abdominal pain and vomiting.  Genitourinary: Negative for dysuria and hematuria.  Musculoskeletal: Positive for gait problem, myalgias and stiffness. Negative for arthralgias and back pain.  Skin: Positive for color change and wound. Negative for itching and rash.  Neurological: Negative for tingling, seizures, syncope and numbness.  All other systems reviewed and are negative.    Physical Exam Updated Vital Signs BP 135/77   Pulse 74   Temp 98.1 F (36.7 C) (Oral)   Resp 15   SpO2 99%   Physical Exam  Constitutional: She appears well-developed and well-nourished. No distress.  HENT:  Head: Normocephalic and atraumatic.  Eyes: Conjunctivae and EOM are normal.  Neck: Neck supple.  Cardiovascular: Normal rate and regular rhythm.   No murmur heard. Pulmonary/Chest: Effort normal and breath sounds normal. No respiratory distress.  Abdominal: Soft. There is no tenderness.  Musculoskeletal:       Left lower leg: She exhibits tenderness, swelling and edema.       Legs: Neurological: She is  alert. No cranial nerve deficit or sensory deficit. GCS eye subscore is 4. GCS verbal subscore is 5. GCS motor subscore is 6.  Skin: Skin is warm and dry. Bruising noted.  Psychiatric: She has a normal mood and affect.  Nursing note and vitals reviewed.        ED Treatments / Results  Labs (all labs ordered are listed, but only abnormal results are displayed) Labs Reviewed  CBC WITH DIFFERENTIAL/PLATELET - Abnormal; Notable for the following:       Result Value   WBC 12.3 (*)    Neutro Abs 8.9 (*)    Monocytes Absolute 1.3 (*)    All other components within normal limits  COMPREHENSIVE METABOLIC PANEL - Abnormal; Notable for the following:    Glucose, Bld 127 (*)    Total Protein 6.2 (*)    Total Bilirubin 1.8 (*)  All other components within normal limits  I-STAT CG4 LACTIC ACID, ED    EKG  EKG Interpretation None       Radiology Dg Tibia/fibula Left  Result Date: 12/27/2016 CLINICAL DATA:  Left lower extremity bruising and infection. Fall last week down 5 steps. EXAM: LEFT TIBIA AND FIBULA - 2 VIEW COMPARISON:  None. FINDINGS: Mild degenerative changes over the knee. No evidence of acute fracture or dislocation. Mild subcutaneous edema. IMPRESSION: No acute fracture. Electronically Signed   By: Marin Olp M.D.   On: 12/27/2016 15:24   Dg Ankle Complete Right  Result Date: 12/27/2016 CLINICAL DATA:  Right foot and ankle pain since a slip and fall on black ice over 1 week ago. Initial encounter. EXAM: RIGHT ANKLE - COMPLETE 3+ VIEW COMPARISON:  None. FINDINGS: Soft tissue swelling is present about the lateral malleolus. No fracture or dislocation is identified. There is no tibiotalar joint effusion. Midfoot degenerative change is noted. There is pes planus deformity with collapse of the midfoot. IMPRESSION: Lateral soft tissue swelling without underlying acute bony or joint abnormality. Pes planus and midfoot osteoarthritis. Electronically Signed   By: Inge Rise  M.D.   On: 12/27/2016 16:14   Dg Foot Complete Right  Result Date: 12/27/2016 CLINICAL DATA:  Right foot and ankle pain since a slip and fall on black ice over 1 week ago. Initial encounter. EXAM: RIGHT FOOT COMPLETE - 3+ VIEW COMPARISON:  None. FINDINGS: No acute bony or joint abnormality is identified. There is moderate first MTP osteoarthritis. The patient is status post resection arthroplasty of the heads of the proximal phalanges of the third, fourth and fifth toes. Midfoot osteoarthritis is seen and appears worst at the first tarsometatarsal joint. Inferior subluxation of the navicular at the talonavicular joint and pes planus deformity are noted. IMPRESSION: No acute abnormality. Postoperative changes described above. Midfoot osteoarthritis and pes planus. Electronically Signed   By: Inge Rise M.D.   On: 12/27/2016 16:21    Procedures Procedures (including critical care time)  Medications Ordered in ED Medications  cefTRIAXone (ROCEPHIN) injection 1 g (1 g Intramuscular Given 12/27/16 1615)  sterile water (preservative free) injection (2.1 mLs  Given 12/27/16 1616)     Initial Impression / Assessment and Plan / ED Course  I have reviewed the triage vital signs and the nursing notes.  Pertinent labs & imaging results that were available during my care of the patient were reviewed by me and considered in my medical decision making (see chart for details).     83 year old female presents in the setting of fall and leg pain. Patient fell on 4/8 with mechanical possibly on ice. Since that time she's had difficulty ambulating worsening swelling and pain to her left lower extremity. Additional mild pain and swelling to right ankle.  On arrival patient had opened draining wound as noted above. No purulence, warmth, fevers noted. X-rays of bilateral lower extremities without fracture or malalignment. Patient is on Coumadin for atrial fibrillation and I believe she has ulcerating  contusion secondary to this fall. Patient was given ceftriaxone emergency Department and will be sent home on Keflex for possible infection to site. Do not believe patient heart inpatient hospitalization the patient was giving follow-up with wound clinic as well as home health nursing, physical therapist and social work. Husband please patient is safe for home care and strict return precautions were given. Patient was stable at time of discharge and patient and family in agreement with plan.  Final Clinical  Impressions(s) / ED Diagnoses   Final diagnoses:  Contusion of left lower extremity, initial encounter  Fall, initial encounter  Skin ulcer, limited to breakdown of skin (HCC)  Contusion of right lower extremity, initial encounter    New Prescriptions New Prescriptions   CEPHALEXIN (KEFLEX) 500 MG CAPSULE    Take 1 capsule (500 mg total) by mouth 3 (three) times daily.   HYDROCODONE-ACETAMINOPHEN (NORCO/VICODIN) 5-325 MG TABLET    Take 1 tablet by mouth every 4 (four) hours as needed.     Esaw Grandchild, MD 12/27/16 [place], MD 12/27/16 [phone]

## 2016-12-27 NOTE — ED Triage Notes (Signed)
PER [facility]: pt from home with c/o left lower extremity bruising and infection. LLE appears bruised, visible deformity and swollen with infection. She states she fell last week down about 5 steps and thought the discoloration was just bruises so she ignored it. + pedal pulse. BP- 155/78, HR-80, 98% RA.

## 2016-12-27 NOTE — Care Management Note (Signed)
Case Management Note  Patient Details  Name: Melissa Knight MRN: [id number] Date of Birth: 28-Aug-1933  Subjective/Objective:         Patient presented to [facility] ED  from home with c/o left lower extremity bruising and infection. EDP evaluated  patient and recommended [place] services.          Action/Plan: CM met with patient to discuss recommendations for [facility] services, patient and spouse agreeable with care transition plan. Offered choice [facility] selected orders obtained and referral faxed in to [facility] [phone]. Patient verbalized understanding and teach back.  Updated Comptroller.  Patient made aware that [facility] nurse will contact her within 24-48 hours after discharge, if she does not receive call she is to contact this CM contact info provided.    Expected Discharge Date: 12/27/2016                 Expected Discharge Plan:  [place]  In-House Referral:  Clinical Social Work  Discharge planning Services  CM Consult  Post Acute Care Choice:  Home Health Choice offered to:  Patient, Spouse  DME Arranged:    DME Agency:     HH Arranged:  RN, PT, Social Work [employer] Agency:  [place]  Status of Service:  Completed, signed off  If discussed at [employer], dates discussed:    Additional CommentsLaurena Slimmer, RN 12/27/2016, 6:37 PM

## 2016-12-27 NOTE — ED Notes (Addendum)
Purple Bruising and swelling also noted to right LE, ankle and foot

## 2016-12-27 NOTE — ED Notes (Signed)
Pt taken back to xray.

## 2016-12-27 NOTE — ED Notes (Signed)
Pt returned from xray

## 2016-12-30 DIAGNOSIS — Z7901 Long term (current) use of anticoagulants: Secondary | ICD-10-CM | POA: Diagnosis not present

## 2016-12-30 DIAGNOSIS — I482 Chronic atrial fibrillation: Secondary | ICD-10-CM | POA: Diagnosis not present

## 2016-12-30 DIAGNOSIS — Z8673 Personal history of transient ischemic attack (TIA), and cerebral infarction without residual deficits: Secondary | ICD-10-CM | POA: Diagnosis not present

## 2016-12-30 DIAGNOSIS — S8011XD Contusion of right lower leg, subsequent encounter: Secondary | ICD-10-CM | POA: Diagnosis not present

## 2016-12-30 DIAGNOSIS — S8012XD Contusion of left lower leg, subsequent encounter: Secondary | ICD-10-CM | POA: Diagnosis not present

## 2016-12-30 DIAGNOSIS — I10 Essential (primary) hypertension: Secondary | ICD-10-CM | POA: Diagnosis not present

## 2016-12-30 DIAGNOSIS — Z9181 History of falling: Secondary | ICD-10-CM | POA: Diagnosis not present

## 2016-12-30 DIAGNOSIS — Z87891 Personal history of nicotine dependence: Secondary | ICD-10-CM | POA: Diagnosis not present

## 2016-12-30 DIAGNOSIS — G40909 Epilepsy, unspecified, not intractable, without status epilepticus: Secondary | ICD-10-CM | POA: Diagnosis not present

## 2017-01-02 DIAGNOSIS — Z9181 History of falling: Secondary | ICD-10-CM | POA: Diagnosis not present

## 2017-01-02 DIAGNOSIS — G40909 Epilepsy, unspecified, not intractable, without status epilepticus: Secondary | ICD-10-CM | POA: Diagnosis not present

## 2017-01-02 DIAGNOSIS — I10 Essential (primary) hypertension: Secondary | ICD-10-CM | POA: Diagnosis not present

## 2017-01-02 DIAGNOSIS — S8012XD Contusion of left lower leg, subsequent encounter: Secondary | ICD-10-CM | POA: Diagnosis not present

## 2017-01-02 DIAGNOSIS — S8011XD Contusion of right lower leg, subsequent encounter: Secondary | ICD-10-CM | POA: Diagnosis not present

## 2017-01-02 DIAGNOSIS — I482 Chronic atrial fibrillation: Secondary | ICD-10-CM | POA: Diagnosis not present

## 2017-01-03 ENCOUNTER — Telehealth: Payer: Self-pay | Admitting: *Deleted

## 2017-01-03 DIAGNOSIS — S8012XD Contusion of left lower leg, subsequent encounter: Secondary | ICD-10-CM | POA: Diagnosis not present

## 2017-01-03 DIAGNOSIS — Z9181 History of falling: Secondary | ICD-10-CM | POA: Diagnosis not present

## 2017-01-03 DIAGNOSIS — S8011XD Contusion of right lower leg, subsequent encounter: Secondary | ICD-10-CM | POA: Diagnosis not present

## 2017-01-03 DIAGNOSIS — I482 Chronic atrial fibrillation: Secondary | ICD-10-CM | POA: Diagnosis not present

## 2017-01-03 DIAGNOSIS — I10 Essential (primary) hypertension: Secondary | ICD-10-CM | POA: Diagnosis not present

## 2017-01-03 DIAGNOSIS — G40909 Epilepsy, unspecified, not intractable, without status epilepticus: Secondary | ICD-10-CM | POA: Diagnosis not present

## 2017-01-03 NOTE — Telephone Encounter (Signed)
Patient was supposed to come in today for a protime. She is unable due to her leg per Otila Kluver. Otila Kluver called and left of voicemail this am, that she could draw today. I didn't come in till 10 am and she had already left patients home. She is going back on Thursday and needs and order.

## 2017-01-04 DIAGNOSIS — I10 Essential (primary) hypertension: Secondary | ICD-10-CM | POA: Diagnosis not present

## 2017-01-04 DIAGNOSIS — I482 Chronic atrial fibrillation: Secondary | ICD-10-CM | POA: Diagnosis not present

## 2017-01-04 DIAGNOSIS — S8012XD Contusion of left lower leg, subsequent encounter: Secondary | ICD-10-CM | POA: Diagnosis not present

## 2017-01-04 DIAGNOSIS — G40909 Epilepsy, unspecified, not intractable, without status epilepticus: Secondary | ICD-10-CM | POA: Diagnosis not present

## 2017-01-04 DIAGNOSIS — Z9181 History of falling: Secondary | ICD-10-CM | POA: Diagnosis not present

## 2017-01-04 DIAGNOSIS — S8011XD Contusion of right lower leg, subsequent encounter: Secondary | ICD-10-CM | POA: Diagnosis not present

## 2017-01-04 NOTE — Telephone Encounter (Signed)
Called Tina with [facility] - INR ordered for tomorrow 01/03/2017

## 2017-01-05 ENCOUNTER — Ambulatory Visit (INDEPENDENT_AMBULATORY_CARE_PROVIDER_SITE_OTHER): Payer: Self-pay | Admitting: Pharmacist

## 2017-01-05 ENCOUNTER — Telehealth: Payer: Self-pay

## 2017-01-05 DIAGNOSIS — Z9181 History of falling: Secondary | ICD-10-CM | POA: Diagnosis not present

## 2017-01-05 DIAGNOSIS — I10 Essential (primary) hypertension: Secondary | ICD-10-CM | POA: Diagnosis not present

## 2017-01-05 DIAGNOSIS — S8012XD Contusion of left lower leg, subsequent encounter: Secondary | ICD-10-CM | POA: Diagnosis not present

## 2017-01-05 DIAGNOSIS — S8011XD Contusion of right lower leg, subsequent encounter: Secondary | ICD-10-CM | POA: Diagnosis not present

## 2017-01-05 DIAGNOSIS — G40909 Epilepsy, unspecified, not intractable, without status epilepticus: Secondary | ICD-10-CM | POA: Diagnosis not present

## 2017-01-05 DIAGNOSIS — I482 Chronic atrial fibrillation: Secondary | ICD-10-CM | POA: Diagnosis not present

## 2017-01-05 LAB — POCT INR: INR: 2.5

## 2017-01-05 NOTE — Telephone Encounter (Signed)
INR 2.5  INR checked by [facility].  Continue current warfarin dose of 7.5mg  tablets - take 1/2 tablet or 3.75mg  daily Recheck INR in 1 week.  Patient and Melissa Knight with [facility] notified.

## 2017-01-09 ENCOUNTER — Ambulatory Visit (INDEPENDENT_AMBULATORY_CARE_PROVIDER_SITE_OTHER): Payer: Self-pay | Admitting: Pharmacist

## 2017-01-09 ENCOUNTER — Telehealth: Payer: Self-pay | Admitting: *Deleted

## 2017-01-09 ENCOUNTER — Encounter (HOSPITAL_BASED_OUTPATIENT_CLINIC_OR_DEPARTMENT_OTHER): Payer: Medicare Other | Attending: Internal Medicine

## 2017-01-09 DIAGNOSIS — I482 Chronic atrial fibrillation: Secondary | ICD-10-CM | POA: Diagnosis not present

## 2017-01-09 DIAGNOSIS — S8012XD Contusion of left lower leg, subsequent encounter: Secondary | ICD-10-CM | POA: Diagnosis not present

## 2017-01-09 DIAGNOSIS — G40909 Epilepsy, unspecified, not intractable, without status epilepticus: Secondary | ICD-10-CM | POA: Diagnosis not present

## 2017-01-09 DIAGNOSIS — Z9181 History of falling: Secondary | ICD-10-CM | POA: Diagnosis not present

## 2017-01-09 DIAGNOSIS — I10 Essential (primary) hypertension: Secondary | ICD-10-CM | POA: Diagnosis not present

## 2017-01-09 DIAGNOSIS — S8011XD Contusion of right lower leg, subsequent encounter: Secondary | ICD-10-CM | POA: Diagnosis not present

## 2017-01-09 LAB — POCT INR: INR: 2.5

## 2017-01-09 NOTE — Telephone Encounter (Signed)
INR 2.5

## 2017-01-10 DIAGNOSIS — Z9181 History of falling: Secondary | ICD-10-CM | POA: Diagnosis not present

## 2017-01-10 DIAGNOSIS — G40909 Epilepsy, unspecified, not intractable, without status epilepticus: Secondary | ICD-10-CM | POA: Diagnosis not present

## 2017-01-10 DIAGNOSIS — S8011XD Contusion of right lower leg, subsequent encounter: Secondary | ICD-10-CM | POA: Diagnosis not present

## 2017-01-10 DIAGNOSIS — I482 Chronic atrial fibrillation: Secondary | ICD-10-CM | POA: Diagnosis not present

## 2017-01-10 DIAGNOSIS — S8012XD Contusion of left lower leg, subsequent encounter: Secondary | ICD-10-CM | POA: Diagnosis not present

## 2017-01-10 DIAGNOSIS — I10 Essential (primary) hypertension: Secondary | ICD-10-CM | POA: Diagnosis not present

## 2017-01-10 NOTE — Telephone Encounter (Signed)
Patient's INR was WNL.  No changed in warfarin dose.  Recheck INR in 1 month

## 2017-01-12 ENCOUNTER — Other Ambulatory Visit: Payer: Self-pay | Admitting: Nurse Practitioner

## 2017-01-12 DIAGNOSIS — G40909 Epilepsy, unspecified, not intractable, without status epilepticus: Secondary | ICD-10-CM | POA: Diagnosis not present

## 2017-01-12 DIAGNOSIS — S8011XD Contusion of right lower leg, subsequent encounter: Secondary | ICD-10-CM | POA: Diagnosis not present

## 2017-01-12 DIAGNOSIS — E785 Hyperlipidemia, unspecified: Secondary | ICD-10-CM

## 2017-01-12 DIAGNOSIS — I10 Essential (primary) hypertension: Secondary | ICD-10-CM | POA: Diagnosis not present

## 2017-01-12 DIAGNOSIS — S8012XD Contusion of left lower leg, subsequent encounter: Secondary | ICD-10-CM | POA: Diagnosis not present

## 2017-01-12 DIAGNOSIS — I482 Chronic atrial fibrillation: Secondary | ICD-10-CM | POA: Diagnosis not present

## 2017-01-12 DIAGNOSIS — Z9181 History of falling: Secondary | ICD-10-CM | POA: Diagnosis not present

## 2017-01-15 ENCOUNTER — Other Ambulatory Visit: Payer: Self-pay | Admitting: Nurse Practitioner

## 2017-01-15 DIAGNOSIS — I482 Chronic atrial fibrillation, unspecified: Secondary | ICD-10-CM

## 2017-01-16 DIAGNOSIS — I482 Chronic atrial fibrillation: Secondary | ICD-10-CM | POA: Diagnosis not present

## 2017-01-16 DIAGNOSIS — I10 Essential (primary) hypertension: Secondary | ICD-10-CM | POA: Diagnosis not present

## 2017-01-16 DIAGNOSIS — G40909 Epilepsy, unspecified, not intractable, without status epilepticus: Secondary | ICD-10-CM | POA: Diagnosis not present

## 2017-01-16 DIAGNOSIS — Z9181 History of falling: Secondary | ICD-10-CM | POA: Diagnosis not present

## 2017-01-16 DIAGNOSIS — S8012XD Contusion of left lower leg, subsequent encounter: Secondary | ICD-10-CM | POA: Diagnosis not present

## 2017-01-16 DIAGNOSIS — S8011XD Contusion of right lower leg, subsequent encounter: Secondary | ICD-10-CM | POA: Diagnosis not present

## 2017-01-17 ENCOUNTER — Ambulatory Visit (INDEPENDENT_AMBULATORY_CARE_PROVIDER_SITE_OTHER): Payer: Medicare Other | Admitting: Nurse Practitioner

## 2017-01-17 ENCOUNTER — Encounter: Payer: Self-pay | Admitting: Nurse Practitioner

## 2017-01-17 VITALS — BP 142/80 | HR 80 | Temp 97.0°F | Ht 63.0 in | Wt 210.0 lb

## 2017-01-17 DIAGNOSIS — Z6836 Body mass index (BMI) 36.0-36.9, adult: Secondary | ICD-10-CM

## 2017-01-17 DIAGNOSIS — I10 Essential (primary) hypertension: Secondary | ICD-10-CM | POA: Diagnosis not present

## 2017-01-17 DIAGNOSIS — E8881 Metabolic syndrome: Secondary | ICD-10-CM | POA: Diagnosis not present

## 2017-01-17 DIAGNOSIS — I4821 Permanent atrial fibrillation: Secondary | ICD-10-CM

## 2017-01-17 DIAGNOSIS — L03116 Cellulitis of left lower limb: Secondary | ICD-10-CM

## 2017-01-17 DIAGNOSIS — E039 Hypothyroidism, unspecified: Secondary | ICD-10-CM | POA: Diagnosis not present

## 2017-01-17 DIAGNOSIS — I482 Chronic atrial fibrillation: Secondary | ICD-10-CM | POA: Diagnosis not present

## 2017-01-17 DIAGNOSIS — G40319 Generalized idiopathic epilepsy and epileptic syndromes, intractable, without status epilepticus: Secondary | ICD-10-CM

## 2017-01-17 DIAGNOSIS — E785 Hyperlipidemia, unspecified: Secondary | ICD-10-CM | POA: Diagnosis not present

## 2017-01-17 DIAGNOSIS — N393 Stress incontinence (female) (male): Secondary | ICD-10-CM | POA: Diagnosis not present

## 2017-01-17 DIAGNOSIS — I6930 Unspecified sequelae of cerebral infarction: Secondary | ICD-10-CM

## 2017-01-17 MED ORDER — LEVOTHYROXINE SODIUM 88 MCG PO TABS: ORAL_TABLET | ORAL | 2 refills | Status: DC

## 2017-01-17 MED ORDER — ATORVASTATIN CALCIUM 10 MG PO TABS: ORAL_TABLET | ORAL | 1 refills | Status: DC

## 2017-01-17 MED ORDER — LEVETIRACETAM 500 MG PO TABS: ORAL_TABLET | ORAL | 1 refills | Status: DC

## 2017-01-17 MED ORDER — CEPHALEXIN 500 MG PO CAPS: 500.0000 mg | ORAL_CAPSULE | Freq: Three times a day (TID) | ORAL | 0 refills | Status: DC

## 2017-01-17 MED ORDER — WARFARIN SODIUM 7.5 MG PO TABS: 7.5000 mg | ORAL_TABLET | Freq: Once | ORAL | 3 refills | Status: DC

## 2017-01-17 MED ORDER — METOPROLOL TARTRATE 50 MG PO TABS: ORAL_TABLET | ORAL | 1 refills | Status: DC

## 2017-01-17 MED ORDER — FUROSEMIDE 20 MG PO TABS: ORAL_TABLET | ORAL | 1 refills | Status: DC

## 2017-01-17 MED ORDER — LISINOPRIL 20 MG PO TABS: ORAL_TABLET | ORAL | 1 refills | Status: DC

## 2017-01-17 MED ORDER — AMLODIPINE BESYLATE 2.5 MG PO TABS: 2.5000 mg | ORAL_TABLET | Freq: Every day | ORAL | 1 refills | Status: DC

## 2017-01-17 NOTE — Patient Instructions (Signed)
Cellulitis, Adult Cellulitis is a skin infection. The infected area is usually red and sore. This condition occurs most often in the arms and lower legs. It is very important to get treated for this condition. Follow these instructions at home:  Take over-the-counter and prescription medicines only as told by your doctor.  If you were prescribed an antibiotic medicine, take it as told by your doctor. Do not stop taking the antibiotic even if you start to feel better.  Drink enough fluid to keep your pee (urine) clear or pale yellow.  Do not touch or rub the infected area.  Raise (elevate) the infected area above the level of your heart while you are sitting or lying down.  Place warm or cold wet cloths (warm or cold compresses) on the infected area. Do this as told by your doctor.  Keep all follow-up visits as told by your doctor. This is important. These visits let your doctor make sure your infection is not getting worse. Contact a doctor if:  You have a fever.  Your symptoms do not get better after 1-2 days of treatment.  Your bone or joint under the infected area starts to hurt after the skin has healed.  Your infection comes back. This can happen in the same area or another area.  You have a swollen bump in the infected area.  You have new symptoms.  You feel ill and also have muscle aches and pains. Get help right away if:  Your symptoms get worse.  You feel very sleepy.  You throw up (vomit) or have watery poop (diarrhea) for a long time.  There are red streaks coming from the infected area.  Your red area gets larger.  Your red area turns darker. This information is not intended to replace advice given to you by your health care provider. Make sure you discuss any questions you have with your health care provider. Document Released: 02/15/2008 Document Revised: 02/04/2016 Document Reviewed: 07/08/2015 Elsevier Interactive Patient Education  2017 Elsevier  Inc.  

## 2017-01-17 NOTE — Progress Notes (Signed)
Subjective:    Patient ID: Melissa Knight, female    DOB: 09-04-1933, 83 y.o.   MRN: [id number]  HPI  Melissa Knight is here today for follow up of chronic medical problem.  Outpatient Encounter Prescriptions as of 01/17/2017  Medication Sig  . amLODipine (NORVASC) 2.5 MG tablet Take 1 tablet (2.5 mg total) by mouth daily.  Marland Kitchen atorvastatin (LIPITOR) 10 MG tablet TAKE 1 TABLET (10 MG TOTAL) BY MOUTH DAILY AT 6 PM.  . betamethasone dipropionate (DIPROLENE) 0.05 % cream APPLY TO AFFECTED AREAS AS NEEDED (Patient not taking: Reported on 11/10/2016)  . furosemide (LASIX) 20 MG tablet TAKE 1 TABLET EVERY DAY AS NEEDED  . HYDROcodone-acetaminophen (NORCO/VICODIN) 5-325 MG tablet Take 1 tablet by mouth every 4 (four) hours as needed.  . levETIRAcetam (KEPPRA) 500 MG tablet TAKE 1 TABLET (500 MG TOTAL) BY MOUTH 2 (TWO) TIMES DAILY. (Patient taking differently: take 1 tablet daily)  . levothyroxine (SYNTHROID, LEVOTHROID) 88 MCG tablet TAKE 1 TABLET (88 MCG TOTAL) BY MOUTH DAILY.  Marland Kitchen lisinopril (PRINIVIL,ZESTRIL) 20 MG tablet TAKE 1 TABLET (20 MG TOTAL) BY MOUTH 2 (TWO) TIMES DAILY.  . metoprolol (LOPRESSOR) 50 MG tablet TAKE 2 TABLETS IN MORNING AND 1 AT BEDTIME  . warfarin (COUMADIN) 7.5 MG tablet Take 1 tablet (7.5 mg total) by mouth one time only at 6 PM. (Patient taking differently: Take 3.75 mg by mouth one time only at 6 PM. )   No facility-administered encounter medications on file as of 01/17/2017.     1. Essential hypertension  No c/o chest pain,SOB or HA- does not check blood pressures at home  2. Permanent atrial fibrillation (HCC)  Is on coumadin- no bleeding problems  3. Hypothyroidism (acquired)   no problems  4. Late effect of cerebrovascular accident  Patient says that she has no residual effects for CVA- she thinks she is back to 100%  5. Seizure disorder, generalized convulsive, intractable ([place])  Last seizure has been several years ago  6. BMI 36.0-36.9,adult  No recent weight gin or  weight loss  7. Hyperlipidemia, unspecified hyperlipidemia type  Does not watch diet  8. Metabolic syndrome  Does not check blood sugars at home  10. Stress incontinence  Wear depends most of the time in public    New complaints: Patient fell a month ago and has a huge hematoma on her left lower shin- she has nurses coming in to take care of wound and she says it is looking better     Review of Systems  Constitutional: Negative for diaphoresis.  Eyes: Negative for pain.  Respiratory: Negative for shortness of breath.   Cardiovascular: Negative for chest pain, palpitations and leg swelling.  Gastrointestinal: Negative for abdominal pain.  Endocrine: Negative for polydipsia.  Skin: Negative for rash.  Neurological: Negative for dizziness, weakness and headaches.  Hematological: Does not bruise/bleed easily.       Objective:   Physical Exam  Constitutional: She is oriented to person, place, and time. She appears well-developed and well-nourished.  HENT:  Nose: Nose normal.  Mouth/Throat: Oropharynx is clear and moist.  Eyes: EOM are normal.  Neck: Trachea normal, normal range of motion and full passive range of motion without pain. Neck supple. No JVD present. Carotid bruit is not present. No thyromegaly present.  Cardiovascular: Normal rate, regular rhythm, normal heart sounds and intact distal pulses.  Exam reveals no gallop and no friction rub.   No murmur heard. Pulmonary/Chest: Effort normal and breath sounds normal.  Abdominal: Soft. Bowel sounds are normal. She exhibits no distension and no mass. There is no tenderness.  Musculoskeletal: Normal range of motion.  Lymphadenopathy:    She has no cervical adenopathy.  Neurological: She is alert and oriented to person, place, and time. She has normal reflexes.  Skin: Skin is warm and dry.  Large hematoma with distal erythema and hot  To touch left shin  Psychiatric: She has a normal mood and affect. Her behavior is  normal. Judgment and thought content normal.   BP (!) 142/80   Pulse 80   Temp 97 F (36.1 C) (Oral)   Ht _0  (1.6 m)   Wt 210 lb (95.3 kg)   BMI 37.20 kg/m        Assessment & Plan:  1. Essential hypertension Low sodium diet - CMP14+EGFR - furosemide (LASIX) 20 MG tablet; TAKE 1 TABLET EVERY DAY AS NEEDED  Dispense: 30 tablet; Refill: 1 - amLODipine (NORVASC) 2.5 MG tablet; Take 1 tablet (2.5 mg total) by mouth daily.  Dispense: 90 tablet; Refill: 1 - lisinopril (PRINIVIL,ZESTRIL) 20 MG tablet; TAKE 1 TABLET (20 MG TOTAL) BY MOUTH 2 (TWO) TIMES DAILY.  Dispense: 180 tablet; Refill: 1  2. Permanent atrial fibrillation (HCC) Keep follow up with cardioloy - metoprolol (LOPRESSOR) 50 MG tablet; TAKE 2 TABLETS IN MORNING AND 1 AT BEDTIME  Dispense: 270 tablet; Refill: 1 - warfarin (COUMADIN) 7.5 MG tablet; Take 1 tablet (7.5 mg total) by mouth one time only at 6 PM.  Dispense: 90 tablet; Refill: 3  3. Hypothyroidism (acquired) - Thyroid Panel With TSH  4. Late effect of cerebrovascular accident  5. Seizure disorder, generalized convulsive, intractable ([place]) Keep follow up with neurology - levETIRAcetam (KEPPRA) 500 MG tablet; TAKE 1 TABLET (500 MG TOTAL) BY MOUTH 2 (TWO) TIMES DAILY.  Dispense: 180 tablet; Refill: 1  6. BMI 36.0-36.9,adult Discussed diet and exercise for person with BMI >25 Will recheck weight in 3-6 months  7. Hyperlipidemia, unspecified hyperlipidemia type Low fat diet - atorvastatin (LIPITOR) 10 MG tablet; TAKE 1 TABLET (10 MG TOTAL) BY MOUTH DAILY AT 6 PM.  Dispense: 90 tablet; Refill: 1 - Lipid panel  8. Metabolic syndrome Watch carbs in diet  9. Stress incontinence   10. Cellulitis of left lower extremity Ice BID elevate when sitting - cephALEXin (KEFLEX) 500 MG capsule; Take 1 capsule (500 mg total) by mouth 3 (three) times daily.  Dispense: 30 capsule; Refill: 0    Labs pending Health maintenance reviewed Diet and exercise  encouraged Continue all meds Follow up  In 3 months [place], FNP

## 2017-01-18 DIAGNOSIS — S8012XD Contusion of left lower leg, subsequent encounter: Secondary | ICD-10-CM | POA: Diagnosis not present

## 2017-01-18 DIAGNOSIS — I10 Essential (primary) hypertension: Secondary | ICD-10-CM | POA: Diagnosis not present

## 2017-01-18 DIAGNOSIS — Z9181 History of falling: Secondary | ICD-10-CM | POA: Diagnosis not present

## 2017-01-18 DIAGNOSIS — I482 Chronic atrial fibrillation: Secondary | ICD-10-CM | POA: Diagnosis not present

## 2017-01-18 DIAGNOSIS — G40909 Epilepsy, unspecified, not intractable, without status epilepticus: Secondary | ICD-10-CM | POA: Diagnosis not present

## 2017-01-18 DIAGNOSIS — S8011XD Contusion of right lower leg, subsequent encounter: Secondary | ICD-10-CM | POA: Diagnosis not present

## 2017-01-18 LAB — CMP14+EGFR
ALT: 17 IU/L (ref 0–32)
AST: 26 IU/L (ref 0–40)
Albumin/Globulin Ratio: 1.7 (ref 1.2–2.2)
Albumin: 3.8 g/dL (ref 3.5–4.7)
Alkaline Phosphatase: 64 IU/L (ref 39–117)
BUN/Creatinine Ratio: 17 (ref 12–28)
BUN: 12 mg/dL (ref 8–27)
Bilirubin Total: 0.7 mg/dL (ref 0.0–1.2)
CALCIUM: 9.1 mg/dL (ref 8.7–10.3)
CO2: 24 mmol/L (ref 18–29)
CREATININE: 0.71 mg/dL (ref 0.57–1.00)
Chloride: 105 mmol/L (ref 96–106)
GFR calc Af Amer: 92 mL/min/{1.73_m2} (ref >59)
GFR, EST NON AFRICAN AMERICAN: 80 mL/min/{1.73_m2} (ref >59)
Globulin, Total: 2.3 g/dL (ref 1.5–4.5)
Glucose: 92 mg/dL (ref 65–99)
Potassium: 4.4 mmol/L (ref 3.5–5.2)
SODIUM: 140 mmol/L (ref 134–144)
Total Protein: 6.1 g/dL (ref 6.0–8.5)

## 2017-01-18 LAB — THYROID PANEL WITH TSH
FREE THYROXINE INDEX: 3.5 (ref 1.2–4.9)
T3 UPTAKE RATIO: 30 % (ref 24–39)
T4 TOTAL: 11.7 ug/dL (ref 4.5–12.0)
TSH: 1.88 u[IU]/mL (ref 0.450–4.500)

## 2017-01-18 LAB — LIPID PANEL
CHOL/HDL RATIO: 3.4 ratio (ref 0.0–4.4)
Cholesterol, Total: 169 mg/dL (ref 100–199)
HDL: 50 mg/dL (ref >39)
LDL CALC: 94 mg/dL (ref 0–99)
TRIGLYCERIDES: 126 mg/dL (ref 0–149)
VLDL Cholesterol Cal: 25 mg/dL (ref 5–40)

## 2017-01-24 DIAGNOSIS — I482 Chronic atrial fibrillation: Secondary | ICD-10-CM | POA: Diagnosis not present

## 2017-01-24 DIAGNOSIS — S8012XD Contusion of left lower leg, subsequent encounter: Secondary | ICD-10-CM | POA: Diagnosis not present

## 2017-01-24 DIAGNOSIS — G40909 Epilepsy, unspecified, not intractable, without status epilepticus: Secondary | ICD-10-CM | POA: Diagnosis not present

## 2017-01-24 DIAGNOSIS — S8011XD Contusion of right lower leg, subsequent encounter: Secondary | ICD-10-CM | POA: Diagnosis not present

## 2017-01-24 DIAGNOSIS — I10 Essential (primary) hypertension: Secondary | ICD-10-CM | POA: Diagnosis not present

## 2017-01-24 DIAGNOSIS — Z9181 History of falling: Secondary | ICD-10-CM | POA: Diagnosis not present

## 2017-01-25 DIAGNOSIS — I10 Essential (primary) hypertension: Secondary | ICD-10-CM | POA: Diagnosis not present

## 2017-01-25 DIAGNOSIS — Z9181 History of falling: Secondary | ICD-10-CM | POA: Diagnosis not present

## 2017-01-25 DIAGNOSIS — G40909 Epilepsy, unspecified, not intractable, without status epilepticus: Secondary | ICD-10-CM | POA: Diagnosis not present

## 2017-01-25 DIAGNOSIS — I482 Chronic atrial fibrillation: Secondary | ICD-10-CM | POA: Diagnosis not present

## 2017-01-25 DIAGNOSIS — S8011XD Contusion of right lower leg, subsequent encounter: Secondary | ICD-10-CM | POA: Diagnosis not present

## 2017-01-25 DIAGNOSIS — S8012XD Contusion of left lower leg, subsequent encounter: Secondary | ICD-10-CM | POA: Diagnosis not present

## 2017-01-26 DIAGNOSIS — I482 Chronic atrial fibrillation: Secondary | ICD-10-CM | POA: Diagnosis not present

## 2017-01-26 DIAGNOSIS — Z9181 History of falling: Secondary | ICD-10-CM | POA: Diagnosis not present

## 2017-01-26 DIAGNOSIS — G40909 Epilepsy, unspecified, not intractable, without status epilepticus: Secondary | ICD-10-CM | POA: Diagnosis not present

## 2017-01-26 DIAGNOSIS — S8012XD Contusion of left lower leg, subsequent encounter: Secondary | ICD-10-CM | POA: Diagnosis not present

## 2017-01-26 DIAGNOSIS — I10 Essential (primary) hypertension: Secondary | ICD-10-CM | POA: Diagnosis not present

## 2017-01-26 DIAGNOSIS — S8011XD Contusion of right lower leg, subsequent encounter: Secondary | ICD-10-CM | POA: Diagnosis not present

## 2017-02-08 DIAGNOSIS — I482 Chronic atrial fibrillation: Secondary | ICD-10-CM | POA: Diagnosis not present

## 2017-02-08 DIAGNOSIS — S8012XD Contusion of left lower leg, subsequent encounter: Secondary | ICD-10-CM | POA: Diagnosis not present

## 2017-02-08 DIAGNOSIS — Z9181 History of falling: Secondary | ICD-10-CM | POA: Diagnosis not present

## 2017-02-08 DIAGNOSIS — S8011XD Contusion of right lower leg, subsequent encounter: Secondary | ICD-10-CM | POA: Diagnosis not present

## 2017-02-08 DIAGNOSIS — G40909 Epilepsy, unspecified, not intractable, without status epilepticus: Secondary | ICD-10-CM | POA: Diagnosis not present

## 2017-02-08 DIAGNOSIS — I10 Essential (primary) hypertension: Secondary | ICD-10-CM | POA: Diagnosis not present

## 2017-02-10 ENCOUNTER — Ambulatory Visit: Payer: Medicare Other

## 2017-02-14 ENCOUNTER — Ambulatory Visit (INDEPENDENT_AMBULATORY_CARE_PROVIDER_SITE_OTHER): Payer: Medicare Other | Admitting: Pharmacist

## 2017-02-14 ENCOUNTER — Other Ambulatory Visit: Payer: Self-pay

## 2017-02-14 DIAGNOSIS — Z7901 Long term (current) use of anticoagulants: Secondary | ICD-10-CM | POA: Diagnosis not present

## 2017-02-14 DIAGNOSIS — I10 Essential (primary) hypertension: Secondary | ICD-10-CM

## 2017-02-14 DIAGNOSIS — I482 Chronic atrial fibrillation, unspecified: Secondary | ICD-10-CM

## 2017-02-14 DIAGNOSIS — I6930 Unspecified sequelae of cerebral infarction: Secondary | ICD-10-CM

## 2017-02-14 LAB — COAGUCHEK XS/INR WAIVED
INR: 2 — AB (ref 0.9–1.1)
PROTHROMBIN TIME: 24.4 s

## 2017-02-14 MED ORDER — FUROSEMIDE 20 MG PO TABS: ORAL_TABLET | ORAL | 1 refills | Status: DC

## 2017-03-20 ENCOUNTER — Ambulatory Visit (INDEPENDENT_AMBULATORY_CARE_PROVIDER_SITE_OTHER): Payer: Medicare Other | Admitting: Pharmacist

## 2017-03-20 DIAGNOSIS — Z7901 Long term (current) use of anticoagulants: Secondary | ICD-10-CM

## 2017-03-20 DIAGNOSIS — I482 Chronic atrial fibrillation, unspecified: Secondary | ICD-10-CM

## 2017-03-20 DIAGNOSIS — I6930 Unspecified sequelae of cerebral infarction: Secondary | ICD-10-CM

## 2017-03-20 LAB — COAGUCHEK XS/INR WAIVED
INR: 2.2 — ABNORMAL HIGH (ref 0.9–1.1)
PROTHROMBIN TIME: 26.4 s

## 2017-04-11 ENCOUNTER — Ambulatory Visit (INDEPENDENT_AMBULATORY_CARE_PROVIDER_SITE_OTHER): Payer: Medicare Other | Admitting: Pediatrics

## 2017-04-11 DIAGNOSIS — S8011XD Contusion of right lower leg, subsequent encounter: Secondary | ICD-10-CM

## 2017-04-11 DIAGNOSIS — I10 Essential (primary) hypertension: Secondary | ICD-10-CM | POA: Diagnosis not present

## 2017-04-11 DIAGNOSIS — S8012XD Contusion of left lower leg, subsequent encounter: Secondary | ICD-10-CM

## 2017-04-11 DIAGNOSIS — I482 Chronic atrial fibrillation: Secondary | ICD-10-CM

## 2017-04-25 ENCOUNTER — Encounter: Payer: Self-pay | Admitting: Nurse Practitioner

## 2017-04-25 ENCOUNTER — Ambulatory Visit (INDEPENDENT_AMBULATORY_CARE_PROVIDER_SITE_OTHER): Payer: Medicare Other | Admitting: Nurse Practitioner

## 2017-04-25 VITALS — BP 162/88 | HR 68 | Temp 96.8°F | Ht 63.0 in | Wt 210.0 lb

## 2017-04-25 DIAGNOSIS — E785 Hyperlipidemia, unspecified: Secondary | ICD-10-CM

## 2017-04-25 DIAGNOSIS — I4821 Permanent atrial fibrillation: Secondary | ICD-10-CM

## 2017-04-25 DIAGNOSIS — Z7901 Long term (current) use of anticoagulants: Secondary | ICD-10-CM

## 2017-04-25 DIAGNOSIS — I482 Chronic atrial fibrillation, unspecified: Secondary | ICD-10-CM

## 2017-04-25 DIAGNOSIS — Z6836 Body mass index (BMI) 36.0-36.9, adult: Secondary | ICD-10-CM | POA: Diagnosis not present

## 2017-04-25 DIAGNOSIS — G40319 Generalized idiopathic epilepsy and epileptic syndromes, intractable, without status epilepticus: Secondary | ICD-10-CM | POA: Diagnosis not present

## 2017-04-25 DIAGNOSIS — N393 Stress incontinence (female) (male): Secondary | ICD-10-CM

## 2017-04-25 DIAGNOSIS — E039 Hypothyroidism, unspecified: Secondary | ICD-10-CM

## 2017-04-25 DIAGNOSIS — I6930 Unspecified sequelae of cerebral infarction: Secondary | ICD-10-CM

## 2017-04-25 DIAGNOSIS — E8881 Metabolic syndrome: Secondary | ICD-10-CM | POA: Diagnosis not present

## 2017-04-25 DIAGNOSIS — I10 Essential (primary) hypertension: Secondary | ICD-10-CM | POA: Diagnosis not present

## 2017-04-25 DIAGNOSIS — E6609 Other obesity due to excess calories: Secondary | ICD-10-CM

## 2017-04-25 DIAGNOSIS — Z6835 Body mass index (BMI) 35.0-35.9, adult: Secondary | ICD-10-CM

## 2017-04-25 LAB — COAGUCHEK XS/INR WAIVED
INR: 2.8 — ABNORMAL HIGH (ref 0.9–1.1)
PROTHROMBIN TIME: 33.4 s

## 2017-04-25 MED ORDER — LISINOPRIL 20 MG PO TABS: ORAL_TABLET | ORAL | 1 refills | Status: DC

## 2017-04-25 MED ORDER — LEVETIRACETAM 500 MG PO TABS: 500.0000 mg | ORAL_TABLET | Freq: Every day | ORAL | 1 refills | Status: DC

## 2017-04-25 MED ORDER — LEVOTHYROXINE SODIUM 88 MCG PO TABS: ORAL_TABLET | ORAL | 2 refills | Status: DC

## 2017-04-25 MED ORDER — ATORVASTATIN CALCIUM 10 MG PO TABS: ORAL_TABLET | ORAL | 1 refills | Status: DC

## 2017-04-25 MED ORDER — METOPROLOL TARTRATE 50 MG PO TABS: ORAL_TABLET | ORAL | 1 refills | Status: DC

## 2017-04-25 MED ORDER — FUROSEMIDE 20 MG PO TABS: ORAL_TABLET | ORAL | 1 refills | Status: DC

## 2017-04-25 MED ORDER — WARFARIN SODIUM 7.5 MG PO TABS: 7.5000 mg | ORAL_TABLET | Freq: Once | ORAL | 3 refills | Status: DC

## 2017-04-25 MED ORDER — AMLODIPINE BESYLATE 2.5 MG PO TABS: 2.5000 mg | ORAL_TABLET | Freq: Every day | ORAL | 1 refills | Status: DC

## 2017-04-25 NOTE — Progress Notes (Signed)
Subjective:    Patient ID: Melissa Knight, female    DOB: Oct 10, 1932, 83 y.o.   MRN: [id number]  HPI  Melissa Knight is here today for follow up of chronic medical problem.  Outpatient Encounter Prescriptions as of 04/25/2017  Medication Sig  . amLODipine (NORVASC) 2.5 MG tablet Take 1 tablet (2.5 mg total) by mouth daily.  Marland Kitchen atorvastatin (LIPITOR) 10 MG tablet TAKE 1 TABLET (10 MG TOTAL) BY MOUTH DAILY AT 6 PM.  . betamethasone dipropionate (DIPROLENE) 0.05 % cream APPLY TO AFFECTED AREAS AS NEEDED  . furosemide (LASIX) 20 MG tablet TAKE 1 TABLET EVERY DAY AS NEEDED  . levETIRAcetam (KEPPRA) 500 MG tablet Take 500 mg by mouth daily.  Marland Kitchen levothyroxine (SYNTHROID, LEVOTHROID) 88 MCG tablet TAKE 1 TABLET (88 MCG TOTAL) BY MOUTH DAILY.  Marland Kitchen lisinopril (PRINIVIL,ZESTRIL) 20 MG tablet TAKE 1 TABLET (20 MG TOTAL) BY MOUTH 2 (TWO) TIMES DAILY.  . metoprolol (LOPRESSOR) 50 MG tablet TAKE 2 TABLETS IN MORNING AND 1 AT BEDTIME  . warfarin (COUMADIN) 7.5 MG tablet Take 1 tablet (7.5 mg total) by mouth one time only at 6 PM.    1. Permanent atrial fibrillation (HCC)  denies any palpitations. Sees cardiology yearly  2. Atrial fibrillation, chronic ([place])  See above  3. Chronic anticoagulation  On coumadin- no bleeding  4. Essential hypertension  No c/o chest pain , SOB and headache. Does not check blood pressure at home  5. Hypothyroidism (acquired)  No problems that she is aware of  6. Seizure disorder, generalized convulsive, intractable ([place])  Has not had seizure in over 5 years- is still on keppra.  7. Stress incontinence  Has troubling holding urine- has occasional accidents  8. Class 2 obesity due to excess calories with body mass index (BMI) of 35.0 to 35.9 in adult, unspecified whether serious comorbidity present no recent weight changes  9. Metabolic syndrome  Watches carbs in diet- does not check blood sugars  10. Late effect of cerebrovascular accident  No permanent effects from stroke    74. Hyperlipidemia, unspecified hyperlipidemia type  Tries to avoid fried foods  12. BMI 36.0-36.9,adult  No recent weight changes    New complaints: None today  Social history: Husband still living     Review of Systems  Constitutional: Negative for activity change and appetite change.  HENT: Negative.   Eyes: Negative for pain.  Respiratory: Negative for shortness of breath.   Cardiovascular: Negative for chest pain, palpitations and leg swelling.  Gastrointestinal: Negative for abdominal pain.  Endocrine: Negative for polydipsia.  Genitourinary: Negative.   Skin: Negative for rash.  Neurological: Negative for dizziness, weakness and headaches.  Hematological: Does not bruise/bleed easily.  Psychiatric/Behavioral: Negative.   All other systems reviewed and are negative.      Objective:   Physical Exam  Constitutional: She is oriented to person, place, and time. She appears well-developed and well-nourished.  HENT:  Nose: Nose normal.  Mouth/Throat: Oropharynx is clear and moist.  Eyes: EOM are normal.  Neck: Trachea normal, normal range of motion and full passive range of motion without pain. Neck supple. No JVD present. Carotid bruit is not present. No thyromegaly present.  Cardiovascular: Normal rate, regular rhythm, normal heart sounds and intact distal pulses.  Exam reveals no gallop and no friction rub.   No murmur heard. Pulmonary/Chest: Effort normal and breath sounds normal.  Abdominal: Soft. Bowel sounds are normal. She exhibits no distension and no mass. There is no tenderness.  Musculoskeletal:  Normal range of motion.  Lymphadenopathy:    She has no cervical adenopathy.  Neurological: She is alert and oriented to person, place, and time. She has normal reflexes.  Skin: Skin is warm and dry.  Psychiatric: She has a normal mood and affect. Her behavior is normal. Judgment and thought content normal.   BP (!) 162/88   Pulse 68   Temp (!) 96.8 F (36  C) (Oral)   Ht 5' 3"  (1.6 m)   Wt 210 lb (95.3 kg)   BMI 37.20 kg/m         Assessment & Plan:  1. Atrial fibrillation, chronic (HCC) Avoid caffeine - CoaguChek XS/INR Waived  2. Chronic anticoagulation Recheck in 4 weeks - CoaguChek XS/INR Waived  3. Permanent atrial fibrillation (HCC) - warfarin (COUMADIN) 7.5 MG tablet; Take 1 tablet (7.5 mg total) by mouth one time only at 6 PM.  Dispense: 90 tablet; Refill: 3 - metoprolol tartrate (LOPRESSOR) 50 MG tablet; TAKE 2 TABLETS IN MORNING AND 1 AT BEDTIME  Dispense: 270 tablet; Refill: 1  4. Essential hypertension Low sodium diet - amLODipine (NORVASC) 2.5 MG tablet; Take 1 tablet (2.5 mg total) by mouth daily.  Dispense: 90 tablet; Refill: 1 - lisinopril (PRINIVIL,ZESTRIL) 20 MG tablet; TAKE 1 TABLET (20 MG TOTAL) BY MOUTH 2 (TWO) TIMES DAILY.  Dispense: 180 tablet; Refill: 1 - furosemide (LASIX) 20 MG tablet; TAKE 1 TABLET EVERY DAY AS NEEDED  Dispense: 90 tablet; Refill: 1 - CMP14+EGFR  5. Hypothyroidism (acquired) - levothyroxine (SYNTHROID, LEVOTHROID) 88 MCG tablet; TAKE 1 TABLET (88 MCG TOTAL) BY MOUTH DAILY.  Dispense: 90 tablet; Refill: 2  6. Seizure disorder, generalized convulsive, intractable (HCC) - levETIRAcetam (KEPPRA) 500 MG tablet; Take 1 tablet (500 mg total) by mouth daily.  Dispense: 90 tablet; Refill: 1  7. Stress incontinence Wear depends  8. Class 2 obesity due to excess calories with body mass index (BMI) of 35.0 to 35.9 in adult, unspecified whether serious comorbidity present Discussed diet and exercise for person with BMI >25 Will recheck weight in 3-6 months  9. Metabolic syndrome Watch carbs in diet  10. Late effect of cerebrovascular accident  51. Hyperlipidemia, unspecified hyperlipidemia type llow fat diet - atorvastatin (LIPITOR) 10 MG tablet; TAKE 1 TABLET (10 MG TOTAL) BY MOUTH DAILY AT 6 PM.  Dispense: 90 tablet; Refill: 1 - Lipid panel  12. BMI 36.0-36.9,adult Discussed diet  and exercise for person with BMI >25 Will recheck weight in 3-6 months     Labs pending Health maintenance reviewed Diet and exercise encouraged Continue all meds Follow up  In 3 months   [place], FNP

## 2017-04-25 NOTE — Patient Instructions (Signed)

## 2017-04-26 LAB — CMP14+EGFR
ALK PHOS: 55 IU/L (ref 39–117)
ALT: 15 IU/L (ref 0–32)
AST: 22 IU/L (ref 0–40)
Albumin/Globulin Ratio: 1.7 (ref 1.2–2.2)
Albumin: 3.8 g/dL (ref 3.5–4.7)
BILIRUBIN TOTAL: 0.5 mg/dL (ref 0.0–1.2)
BUN/Creatinine Ratio: 13 (ref 12–28)
BUN: 11 mg/dL (ref 8–27)
CHLORIDE: 105 mmol/L (ref 96–106)
CO2: 24 mmol/L (ref 20–29)
CREATININE: 0.83 mg/dL (ref 0.57–1.00)
Calcium: 8.9 mg/dL (ref 8.7–10.3)
GFR calc Af Amer: 76 mL/min/{1.73_m2} (ref >59)
GFR calc non Af Amer: 66 mL/min/{1.73_m2} (ref >59)
GLOBULIN, TOTAL: 2.3 g/dL (ref 1.5–4.5)
GLUCOSE: 91 mg/dL (ref 65–99)
Potassium: 4.3 mmol/L (ref 3.5–5.2)
SODIUM: 141 mmol/L (ref 134–144)
Total Protein: 6.1 g/dL (ref 6.0–8.5)

## 2017-04-26 LAB — LIPID PANEL
CHOLESTEROL TOTAL: 167 mg/dL (ref 100–199)
Chol/HDL Ratio: 3.6 ratio (ref 0.0–4.4)
HDL: 47 mg/dL (ref >39)
LDL CALC: 93 mg/dL (ref 0–99)
TRIGLYCERIDES: 136 mg/dL (ref 0–149)
VLDL Cholesterol Cal: 27 mg/dL (ref 5–40)

## 2017-05-24 ENCOUNTER — Ambulatory Visit (INDEPENDENT_AMBULATORY_CARE_PROVIDER_SITE_OTHER): Payer: Medicare Other | Admitting: Nurse Practitioner

## 2017-05-24 ENCOUNTER — Encounter: Payer: Self-pay | Admitting: Nurse Practitioner

## 2017-05-24 VITALS — BP 149/82 | HR 65 | Temp 96.9°F | Ht 63.0 in | Wt 211.0 lb

## 2017-05-24 DIAGNOSIS — I482 Chronic atrial fibrillation, unspecified: Secondary | ICD-10-CM

## 2017-05-24 DIAGNOSIS — I6930 Unspecified sequelae of cerebral infarction: Secondary | ICD-10-CM

## 2017-05-24 LAB — COAGUCHEK XS/INR WAIVED
INR: 3 — ABNORMAL HIGH (ref 0.9–1.1)
Prothrombin Time: 35.5 s

## 2017-05-24 NOTE — Progress Notes (Signed)
Subjective:     Indication: atrial fibrillation Bleeding signs/symptoms: None Thromboembolic signs/symptoms: None  Missed Coumadin doses: None Medication changes: no Dietary changes: no Bacterial/viral infection: no Other concerns: no  The following portions of the patient's history were reviewed and updated as appropriate: allergies, current medications, past family history, past medical history, past social history, past surgical history and problem list.  Review of Systems Pertinent items noted in HPI and remainder of comprehensive ROS otherwise negative.   Objective:    INR Today: 3.0 Current dose: 3.75mg  daily    Assessment:    Therapeutic INR for goal of 2.5-3.5   Plan:    1. New dose: no change   2. Next INR: 1 month    Mary-Margaret Hassell Done, FNP

## 2017-05-24 NOTE — Patient Instructions (Signed)
Anticoagulation Warfarin Dose Instructions as of 05/24/2017      Melissa Knight Tue Wed Thu Fri Sat   New Dose 3.75 mg 3.75 mg 3.75 mg 3.75 mg 3.75 mg 3.75 mg 3.75 mg    Description   Continue current warfarin 7.5mg  dose of 1/2 tablet once a day.  INR was 3.0 today (goal 2.0 to 3.0)    Recheck in 4 weeks

## 2017-07-07 ENCOUNTER — Ambulatory Visit (INDEPENDENT_AMBULATORY_CARE_PROVIDER_SITE_OTHER): Payer: Medicare Other | Admitting: Nurse Practitioner

## 2017-07-07 ENCOUNTER — Encounter: Payer: Self-pay | Admitting: Nurse Practitioner

## 2017-07-07 VITALS — BP 156/83 | HR 70 | Temp 97.1°F | Ht 63.0 in | Wt 212.0 lb

## 2017-07-07 DIAGNOSIS — I6389 Other cerebral infarction: Secondary | ICD-10-CM

## 2017-07-07 DIAGNOSIS — I482 Chronic atrial fibrillation, unspecified: Secondary | ICD-10-CM

## 2017-07-07 DIAGNOSIS — G40319 Generalized idiopathic epilepsy and epileptic syndromes, intractable, without status epilepticus: Secondary | ICD-10-CM | POA: Diagnosis not present

## 2017-07-07 LAB — COAGUCHEK XS/INR WAIVED
INR: 2.4 — AB (ref 0.9–1.1)
Prothrombin Time: 28.6 s

## 2017-07-07 MED ORDER — LEVETIRACETAM 500 MG PO TABS: 500.0000 mg | ORAL_TABLET | Freq: Every day | ORAL | 1 refills | Status: DC

## 2017-07-07 NOTE — Addendum Note (Signed)
Addended by: Rolena Infante on: 07/07/2017 05:04 PM   Modules accepted: Orders

## 2017-07-07 NOTE — Progress Notes (Signed)
Subjective:   Patient in today for INR recheck   Indication: atrial fibrillation Bleeding signs/symptoms: None Thromboembolic signs/symptoms: None  Missed Coumadin doses: None Medication changes: no Dietary changes: no Bacterial/viral infection: no Other concerns: no  The following portions of the patient's history were reviewed and updated as appropriate: allergies, current medications, past family history, past medical history, past social history, past surgical history and problem list.  Review of Systems Pertinent items noted in HPI and remainder of comprehensive ROS otherwise negative.   Objective:    INR Today: 2.4 Current dose: coumadin 7.5mg   1/2 tablet daily    Assessment:    Therapeutic INR for goal of 2-3   Plan:    1. New dose: no change   2. Next INR: 1 month    Mary-Margaret Hassell Done, FNP

## 2017-07-07 NOTE — Patient Instructions (Signed)
Anticoagulation Warfarin Dose Instructions as of 07/07/2017      Dorene Grebe Tue Wed Thu Fri Sat   New Dose 3.75 mg 3.75 mg 3.75 mg 3.75 mg 3.75 mg 3.75 mg 3.75 mg    Description   Continue current warfarin 7.5mg  dose of 1/2 tablet once a day.  INR was 2.42.4 today (goal 2.0 to 3.0)    Recheck in 1 month

## 2017-08-08 ENCOUNTER — Encounter: Payer: Self-pay | Admitting: Nurse Practitioner

## 2017-08-08 ENCOUNTER — Ambulatory Visit (INDEPENDENT_AMBULATORY_CARE_PROVIDER_SITE_OTHER): Payer: Medicare Other | Admitting: Nurse Practitioner

## 2017-08-08 VITALS — BP 148/87 | HR 49 | Temp 96.8°F | Ht 63.0 in | Wt 209.0 lb

## 2017-08-08 DIAGNOSIS — E8881 Metabolic syndrome: Secondary | ICD-10-CM

## 2017-08-08 DIAGNOSIS — Z6836 Body mass index (BMI) 36.0-36.9, adult: Secondary | ICD-10-CM | POA: Diagnosis not present

## 2017-08-08 DIAGNOSIS — Z23 Encounter for immunization: Secondary | ICD-10-CM

## 2017-08-08 DIAGNOSIS — E039 Hypothyroidism, unspecified: Secondary | ICD-10-CM | POA: Diagnosis not present

## 2017-08-08 DIAGNOSIS — I4821 Permanent atrial fibrillation: Secondary | ICD-10-CM

## 2017-08-08 DIAGNOSIS — I6930 Unspecified sequelae of cerebral infarction: Secondary | ICD-10-CM

## 2017-08-08 DIAGNOSIS — E785 Hyperlipidemia, unspecified: Secondary | ICD-10-CM | POA: Diagnosis not present

## 2017-08-08 DIAGNOSIS — N393 Stress incontinence (female) (male): Secondary | ICD-10-CM | POA: Diagnosis not present

## 2017-08-08 DIAGNOSIS — I482 Chronic atrial fibrillation: Secondary | ICD-10-CM

## 2017-08-08 DIAGNOSIS — I10 Essential (primary) hypertension: Secondary | ICD-10-CM

## 2017-08-08 DIAGNOSIS — E6609 Other obesity due to excess calories: Secondary | ICD-10-CM

## 2017-08-08 DIAGNOSIS — G40319 Generalized idiopathic epilepsy and epileptic syndromes, intractable, without status epilepticus: Secondary | ICD-10-CM | POA: Diagnosis not present

## 2017-08-08 DIAGNOSIS — Z6835 Body mass index (BMI) 35.0-35.9, adult: Secondary | ICD-10-CM | POA: Diagnosis not present

## 2017-08-08 LAB — COAGUCHEK XS/INR WAIVED
INR: 2.3 — AB (ref 0.9–1.1)
PROTHROMBIN TIME: 27.3 s

## 2017-08-08 MED ORDER — METOPROLOL TARTRATE 50 MG PO TABS: ORAL_TABLET | ORAL | 1 refills | Status: DC

## 2017-08-08 MED ORDER — WARFARIN SODIUM 7.5 MG PO TABS: 7.5000 mg | ORAL_TABLET | Freq: Once | ORAL | 3 refills | Status: DC

## 2017-08-08 MED ORDER — LEVETIRACETAM 500 MG PO TABS: 500.0000 mg | ORAL_TABLET | Freq: Every day | ORAL | 1 refills | Status: DC

## 2017-08-08 MED ORDER — LEVOTHYROXINE SODIUM 88 MCG PO TABS: ORAL_TABLET | ORAL | 2 refills | Status: DC

## 2017-08-08 MED ORDER — LISINOPRIL 20 MG PO TABS: ORAL_TABLET | ORAL | 1 refills | Status: DC

## 2017-08-08 MED ORDER — FUROSEMIDE 20 MG PO TABS: ORAL_TABLET | ORAL | 1 refills | Status: DC

## 2017-08-08 MED ORDER — ATORVASTATIN CALCIUM 10 MG PO TABS: ORAL_TABLET | ORAL | 1 refills | Status: DC

## 2017-08-08 MED ORDER — AMLODIPINE BESYLATE 2.5 MG PO TABS: 2.5000 mg | ORAL_TABLET | Freq: Every day | ORAL | 1 refills | Status: DC

## 2017-08-08 NOTE — Progress Notes (Signed)
Subjective:    Patient ID: Melissa Knight, female    DOB: 03/16/1933, 83 y.o.   MRN: [id number]  HPI   Melissa Knight is here today for follow up of chronic medical problem.  Outpatient Encounter Medications as of 08/08/2017  Medication Sig  . amLODipine (NORVASC) 2.5 MG tablet Take 1 tablet (2.5 mg total) by mouth daily.  Marland Kitchen atorvastatin (LIPITOR) 10 MG tablet TAKE 1 TABLET (10 MG TOTAL) BY MOUTH DAILY AT 6 PM.  . betamethasone dipropionate (DIPROLENE) 0.05 % cream APPLY TO AFFECTED AREAS AS NEEDED  . furosemide (LASIX) 20 MG tablet TAKE 1 TABLET EVERY DAY AS NEEDED  . levETIRAcetam (KEPPRA) 500 MG tablet Take 1 tablet (500 mg total) by mouth daily.  Marland Kitchen levothyroxine (SYNTHROID, LEVOTHROID) 88 MCG tablet TAKE 1 TABLET (88 MCG TOTAL) BY MOUTH DAILY.  Marland Kitchen lisinopril (PRINIVIL,ZESTRIL) 20 MG tablet TAKE 1 TABLET (20 MG TOTAL) BY MOUTH 2 (TWO) TIMES DAILY.  . metoprolol tartrate (LOPRESSOR) 50 MG tablet TAKE 2 TABLETS IN MORNING AND 1 AT BEDTIME  . warfarin (COUMADIN) 7.5 MG tablet Take 1 tablet (7.5 mg total) by mouth one time only at 6 PM.   No facility-administered encounter medications on file as of 08/08/2017.     1. Essential hypertension  No /o chest pain, sob or headache. Does not check blood pressure at home. BP Readings from Last 3 Encounters:  08/08/17 (!) 148/87  07/07/17 (!) 156/83  05/24/17 (!) 149/82     2. Hyperlipidemia, unspecified hyperlipidemia type  Does not watch diet  3. Permanent atrial fibrillation (HCC)  Occasional palpitations but not often  4. Encounter for immunization  Does not need any today  5. Hypothyroidism (acquired)  No problems that aware  6. Seizure disorder, generalized convulsive, intractable ([place])  No seizure in last 5 years  7. Metabolic syndrome  Does not check blood sugars at home  8. Stress incontinence  Occurs daily  9. BMI 36.0-36.9,adult  No recent weight chnages  10. Class 2 obesity due to excess calories with body mass index (BMI)  of 35.0 to 35.9 in adult, unspecified whether serious comorbidity present  Again no weight changes    Indication: atrial fibrillation Bleeding signs/symptoms: None Thromboembolic signs/symptoms: None  Missed Coumadin doses: None Medication changes: no Dietary changes: no Bacterial/viral infection: no Other concerns: no     New complaints: None today  Social history: Lives with husband and both are fairly healthy and get around well.   Review of Systems  Constitutional: Negative for activity change and appetite change.  HENT: Negative.   Eyes: Negative for pain.  Respiratory: Negative for shortness of breath.   Cardiovascular: Negative for chest pain, palpitations and leg swelling.  Gastrointestinal: Negative for abdominal pain.  Endocrine: Negative for polydipsia.  Genitourinary: Negative.   Skin: Negative for rash.  Neurological: Negative for dizziness, weakness and headaches.  Hematological: Does not bruise/bleed easily.  Psychiatric/Behavioral: Negative.   All other systems reviewed and are negative.      Objective:   Physical Exam  Constitutional: She is oriented to person, place, and time. She appears well-developed and well-nourished.  HENT:  Nose: Nose normal.  Mouth/Throat: Oropharynx is clear and moist.  Eyes: EOM are normal.  Neck: Trachea normal, normal range of motion and full passive range of motion without pain. Neck supple. No JVD present. Carotid bruit is not present. No thyromegaly present.  Cardiovascular: Normal rate, regular rhythm, normal heart sounds and intact distal pulses. Exam reveals no gallop and  no friction rub.  No murmur heard. Pulmonary/Chest: Effort normal and breath sounds normal.  Abdominal: Soft. Bowel sounds are normal. She exhibits no distension and no mass. There is no tenderness.  Musculoskeletal: Normal range of motion.  Lymphadenopathy:    She has no cervical adenopathy.  Neurological: She is alert and oriented to  person, place, and time. She has normal reflexes.  Skin: Skin is warm and dry.  Psychiatric: She has a normal mood and affect. Her behavior is normal. Judgment and thought content normal.   BP (!) 148/87   Pulse (!) 49   Temp (!) 96.8 F (36 C) (Oral)   Ht _0  (1.6 m)   Wt 209 lb (94.8 kg)   BMI 37.02 kg/m   INR 2.3 today which is therapeutic ( 2.0-3.0 )     Assessment & Plan:  1. Essential hypertension Low sodium diet - CMP14+EGFR - amLODipine (NORVASC) 2.5 MG tablet; Take 1 tablet (2.5 mg total) by mouth daily.  Dispense: 90 tablet; Refill: 1 - lisinopril (PRINIVIL,ZESTRIL) 20 MG tablet; TAKE 1 TABLET (20 MG TOTAL) BY MOUTH 2 (TWO) TIMES DAILY.  Dispense: 180 tablet; Refill: 1 - furosemide (LASIX) 20 MG tablet; TAKE 1 TABLET EVERY DAY AS NEEDED  Dispense: 90 tablet; Refill: 1  2. Hyperlipidemia, unspecified hyperlipidemia type Low fat diet - Lipid panel - atorvastatin (LIPITOR) 10 MG tablet; TAKE 1 TABLET (10 MG TOTAL) BY MOUTH DAILY AT 6 PM.  Dispense: 90 tablet; Refill: 1  3. Permanent atrial fibrillation (HCC) - CoaguChek XS/INR Waived - metoprolol tartrate (LOPRESSOR) 50 MG tablet; TAKE 2 TABLETS IN MORNING AND 1 AT BEDTIME  Dispense: 270 tablet; Refill: 1 - warfarin (COUMADIN) 7.5 MG tablet; Take 1 tablet (7.5 mg total) by mouth one time only at 6 PM.  Dispense: 90 tablet; Refill: 3  4. Encounter for immunization Does not need- already had - Flu vaccine HIGH DOSE PF  5. Hypothyroidism (acquired) - levothyroxine (SYNTHROID, LEVOTHROID) 88 MCG tablet; TAKE 1 TABLET (88 MCG TOTAL) BY MOUTH DAILY.  Dispense: 90 tablet; Refill: 2 - Thyroid Panel With TSH  6. Seizure disorder, generalized convulsive, intractable (HCC) - levETIRAcetam (KEPPRA) 500 MG tablet; Take 1 tablet (500 mg total) by mouth daily.  Dispense: 90 tablet; Refill: 1  7. Metabolic syndrome Continue to watch carbs in diet  8. Stress incontinence Wear depends  9. BMI 36.0-36.9,adult Discussed diet  and exercise for person with BMI >25 Will recheck weight in 3-6 months  10. Class 2 obesity due to excess calories with body mass index (BMI) of 35.0 to 35.9 in adult, unspecified whether serious comorbidity present     Labs pending Health maintenance reviewed Diet and exercise encouraged Continue all meds Follow up  In 3 month   [place], FNP

## 2017-08-09 LAB — LIPID PANEL
Chol/HDL Ratio: 3.5 ratio (ref 0.0–4.4)
Cholesterol, Total: 182 mg/dL (ref 100–199)
HDL: 52 mg/dL (ref >39)
LDL Calculated: 101 mg/dL — ABNORMAL HIGH (ref 0–99)
Triglycerides: 146 mg/dL (ref 0–149)
VLDL CHOLESTEROL CAL: 29 mg/dL (ref 5–40)

## 2017-08-09 LAB — THYROID PANEL WITH TSH
Free Thyroxine Index: 3 (ref 1.2–4.9)
T3 Uptake Ratio: 28 % (ref 24–39)
T4 TOTAL: 10.7 ug/dL (ref 4.5–12.0)
TSH: 2.64 u[IU]/mL (ref 0.450–4.500)

## 2017-08-09 LAB — CMP14+EGFR
A/G RATIO: 1.6 (ref 1.2–2.2)
ALT: 16 IU/L (ref 0–32)
AST: 28 IU/L (ref 0–40)
Albumin: 3.9 g/dL (ref 3.5–4.7)
Alkaline Phosphatase: 61 IU/L (ref 39–117)
BUN / CREAT RATIO: 15 (ref 12–28)
BUN: 12 mg/dL (ref 8–27)
Bilirubin Total: 0.6 mg/dL (ref 0.0–1.2)
CALCIUM: 8.8 mg/dL (ref 8.7–10.3)
CHLORIDE: 106 mmol/L (ref 96–106)
CO2: 24 mmol/L (ref 20–29)
Creatinine, Ser: 0.81 mg/dL (ref 0.57–1.00)
GFR calc Af Amer: 78 mL/min/{1.73_m2} (ref >59)
GFR calc non Af Amer: 68 mL/min/{1.73_m2} (ref >59)
GLOBULIN, TOTAL: 2.4 g/dL (ref 1.5–4.5)
Glucose: 93 mg/dL (ref 65–99)
POTASSIUM: 4 mmol/L (ref 3.5–5.2)
Sodium: 144 mmol/L (ref 134–144)
Total Protein: 6.3 g/dL (ref 6.0–8.5)

## 2017-09-25 ENCOUNTER — Ambulatory Visit: Payer: Medicare Other | Admitting: Nurse Practitioner

## 2017-09-26 ENCOUNTER — Ambulatory Visit (INDEPENDENT_AMBULATORY_CARE_PROVIDER_SITE_OTHER): Payer: Medicare Other | Admitting: Nurse Practitioner

## 2017-09-26 ENCOUNTER — Encounter: Payer: Self-pay | Admitting: Nurse Practitioner

## 2017-09-26 VITALS — BP 154/90 | HR 66 | Temp 97.2°F | Ht 63.0 in | Wt 212.0 lb

## 2017-09-26 DIAGNOSIS — I482 Chronic atrial fibrillation: Secondary | ICD-10-CM

## 2017-09-26 DIAGNOSIS — I4821 Permanent atrial fibrillation: Secondary | ICD-10-CM

## 2017-09-26 LAB — COAGUCHEK XS/INR WAIVED
INR: 2.4 — ABNORMAL HIGH (ref 0.9–1.1)
Prothrombin Time: 28.2 s

## 2017-09-26 NOTE — Progress Notes (Signed)
Subjective:   Patient here today for INR   Indication: atrial fibrillation Bleeding signs/symptoms: None Thromboembolic signs/symptoms: None  Missed Coumadin doses: None Medication changes: no Dietary changes: no Bacterial/viral infection: no Other concerns: no  The following portions of the patient's history were reviewed and updated as appropriate: allergies, current medications, past family history, past medical history, past social history, past surgical history and problem list.  Review of Systems Pertinent items noted in HPI and remainder of comprehensive ROS otherwise negative.   Objective:    INR Today: 2.4 Current dose: coumadin 7.5mg  1/2 daily    Assessment:    Therapeutic INR for goal of 2-3   Plan:    1. New dose: no change   2. Next INR: 1 month    Mary-Margaret Hassell Done, FNP

## 2017-11-08 ENCOUNTER — Ambulatory Visit (INDEPENDENT_AMBULATORY_CARE_PROVIDER_SITE_OTHER): Payer: Medicare Other | Admitting: Nurse Practitioner

## 2017-11-08 ENCOUNTER — Encounter: Payer: Self-pay | Admitting: Nurse Practitioner

## 2017-11-08 VITALS — BP 136/78 | HR 60 | Temp 96.8°F | Ht 63.0 in | Wt 209.0 lb

## 2017-11-08 DIAGNOSIS — E039 Hypothyroidism, unspecified: Secondary | ICD-10-CM | POA: Diagnosis not present

## 2017-11-08 DIAGNOSIS — E6609 Other obesity due to excess calories: Secondary | ICD-10-CM

## 2017-11-08 DIAGNOSIS — N904 Leukoplakia of vulva: Secondary | ICD-10-CM

## 2017-11-08 DIAGNOSIS — I6930 Unspecified sequelae of cerebral infarction: Secondary | ICD-10-CM

## 2017-11-08 DIAGNOSIS — I482 Chronic atrial fibrillation: Secondary | ICD-10-CM

## 2017-11-08 DIAGNOSIS — Z6835 Body mass index (BMI) 35.0-35.9, adult: Secondary | ICD-10-CM

## 2017-11-08 DIAGNOSIS — E8881 Metabolic syndrome: Secondary | ICD-10-CM

## 2017-11-08 DIAGNOSIS — N393 Stress incontinence (female) (male): Secondary | ICD-10-CM

## 2017-11-08 DIAGNOSIS — Z6836 Body mass index (BMI) 36.0-36.9, adult: Secondary | ICD-10-CM

## 2017-11-08 DIAGNOSIS — I10 Essential (primary) hypertension: Secondary | ICD-10-CM | POA: Diagnosis not present

## 2017-11-08 DIAGNOSIS — E785 Hyperlipidemia, unspecified: Secondary | ICD-10-CM

## 2017-11-08 DIAGNOSIS — I4821 Permanent atrial fibrillation: Secondary | ICD-10-CM

## 2017-11-08 DIAGNOSIS — G40319 Generalized idiopathic epilepsy and epileptic syndromes, intractable, without status epilepticus: Secondary | ICD-10-CM

## 2017-11-08 LAB — COAGUCHEK XS/INR WAIVED
INR: 2.1 — AB (ref 0.9–1.1)
PROTHROMBIN TIME: 24.7 s

## 2017-11-08 MED ORDER — AMLODIPINE BESYLATE 2.5 MG PO TABS: 2.5000 mg | ORAL_TABLET | Freq: Every day | ORAL | 1 refills | Status: DC

## 2017-11-08 MED ORDER — ATORVASTATIN CALCIUM 10 MG PO TABS: ORAL_TABLET | ORAL | 1 refills | Status: DC

## 2017-11-08 MED ORDER — WARFARIN SODIUM 7.5 MG PO TABS: 7.5000 mg | ORAL_TABLET | Freq: Once | ORAL | 3 refills | Status: DC

## 2017-11-08 MED ORDER — BETAMETHASONE DIPROPIONATE 0.05 % EX CREA: TOPICAL_CREAM | CUTANEOUS | 2 refills | Status: DC

## 2017-11-08 MED ORDER — METOPROLOL TARTRATE 50 MG PO TABS
ORAL_TABLET | ORAL | 1 refills | Status: DC
Start: 2017-11-08 — End: 2018-02-27

## 2017-11-08 MED ORDER — LISINOPRIL 20 MG PO TABS: ORAL_TABLET | ORAL | 1 refills | Status: DC

## 2017-11-08 MED ORDER — FUROSEMIDE 20 MG PO TABS
ORAL_TABLET | ORAL | 1 refills | Status: DC
Start: 2017-11-08 — End: 2018-02-27

## 2017-11-08 MED ORDER — LEVETIRACETAM 500 MG PO TABS: 500.0000 mg | ORAL_TABLET | Freq: Every day | ORAL | 1 refills | Status: DC

## 2017-11-08 MED ORDER — LEVOTHYROXINE SODIUM 88 MCG PO TABS: ORAL_TABLET | ORAL | 2 refills | Status: DC

## 2017-11-08 NOTE — Progress Notes (Signed)
Subjective:    Patient ID: Melissa Knight, female    DOB: 03-06-1933, 84 y.o.   MRN: [id number]  HPI  Melissa Knight is here today for follow up of chronic medical problem.  Outpatient Encounter Medications as of 11/08/2017  Medication Sig  . amLODipine (NORVASC) 2.5 MG tablet Take 1 tablet (2.5 mg total) by mouth daily.  Marland Kitchen atorvastatin (LIPITOR) 10 MG tablet TAKE 1 TABLET (10 MG TOTAL) BY MOUTH DAILY AT 6 PM.  . betamethasone dipropionate (DIPROLENE) 0.05 % cream APPLY TO AFFECTED AREAS AS NEEDED  . furosemide (LASIX) 20 MG tablet TAKE 1 TABLET EVERY DAY AS NEEDED  . levETIRAcetam (KEPPRA) 500 MG tablet Take 1 tablet (500 mg total) by mouth daily.  Marland Kitchen levothyroxine (SYNTHROID, LEVOTHROID) 88 MCG tablet TAKE 1 TABLET (88 MCG TOTAL) BY MOUTH DAILY.  Marland Kitchen lisinopril (PRINIVIL,ZESTRIL) 20 MG tablet TAKE 1 TABLET (20 MG TOTAL) BY MOUTH 2 (TWO) TIMES DAILY.  . metoprolol tartrate (LOPRESSOR) 50 MG tablet TAKE 2 TABLETS IN MORNING AND 1 AT BEDTIME  . warfarin (COUMADIN) 7.5 MG tablet Take 1 tablet (7.5 mg total) by mouth one time only at 6 PM.     1. Essential hypertension  No c/o chest pain, sob or headache. Does not check blood pressure at home BP Readings from Last 3 Encounters:  09/26/17 (!) 154/90  08/08/17 (!) 148/87  07/07/17 (!) 156/83     2. Permanent atrial fibrillation (HCC)  denies any palpitations  Indication: atrial fibrillation Bleeding signs/symptoms: None Thromboembolic signs/symptoms: None  Missed Coumadin doses: None Medication changes: no Dietary changes: no Bacterial/viral infection: no Other concerns: no     3. Hypothyroidism (acquired)  No problems that she is aware of.  4. Seizure disorder, generalized convulsive, intractable ([place])  Has not had a seizure in many years, but is afraid to stop the keppra. Has no tseen neurology in years  5. BMI 36.0-36.9,adult  No recent weight chnages  6. Hyperlipidemia, unspecified hyperlipidemia type  Does not really watch  diet and does no exercise  7. Late effect of cerebrovascular accident  Had a stroke in 2013 but has no residual effects that she is aware of. denies any weakness  8. Metabolic syndrome  Does not check blood sugars at home  9. Class 2 obesity due to excess calories with body mass index (BMI) of 35.0 to 35.9 in adult,  unspecified whether serious comorbidity present  No recent weight changes  10. Stress incontinence  Still has occasional incontinence when she laughs or coughs  11.    Lichen sclerosus of vulva           Patient says itches al the tome- betamethasone cream helps and she uses in 2-3 x a week New complaints: None today  Social history: Husband still living- they both get around well    Review of Systems  Constitutional: Negative for activity change and appetite change.  HENT: Negative.   Eyes: Negative for pain.  Respiratory: Negative for shortness of breath.   Cardiovascular: Negative for chest pain, palpitations and leg swelling.  Gastrointestinal: Negative for abdominal pain.  Endocrine: Negative for polydipsia.  Genitourinary: Negative.   Skin: Negative for rash.  Neurological: Negative for dizziness, weakness and headaches.  Hematological: Does not bruise/bleed easily.  Psychiatric/Behavioral: Negative.   All other systems reviewed and are negative.      Objective:   Physical Exam  Constitutional: She is oriented to person, place, and time. She appears well-developed and well-nourished.  HENT:  Nose: Nose normal.  Mouth/Throat: Oropharynx is clear and moist.  Eyes: EOM are normal.  Neck: Trachea normal, normal range of motion and full passive range of motion without pain. Neck supple. No JVD present. Carotid bruit is not present. No thyromegaly present.  Cardiovascular: Normal rate, regular rhythm, normal heart sounds and intact distal pulses. Exam reveals no gallop and no friction rub.  No murmur heard. Pulmonary/Chest: Effort normal and breath sounds  normal.  Abdominal: Soft. Bowel sounds are normal. She exhibits no distension and no mass. There is no tenderness.  Musculoskeletal: Normal range of motion. She exhibits edema (1+ edema bil lower ext.).  Lymphadenopathy:    She has no cervical adenopathy.  Neurological: She is alert and oriented to person, place, and time. She has normal reflexes.  Skin: Skin is warm and dry.  Psychiatric: She has a normal mood and affect. Her behavior is normal. Judgment and thought content normal.    BP 136/78   Pulse 60   Temp (!) 96.8 F (36 C) (Oral)   Ht 5' 3"  (1.6 m)   Wt 209 lb (94.8 kg)   BMI 37.02 kg/m   inr 2.1 today     Assessment & Plan:  1. Essential hypertension Low sodium diet - CMP14+EGFR - amLODipine (NORVASC) 2.5 MG tablet; Take 1 tablet (2.5 mg total) by mouth daily.  Dispense: 90 tablet; Refill: 1 - lisinopril (PRINIVIL,ZESTRIL) 20 MG tablet; TAKE 1 TABLET (20 MG TOTAL) BY MOUTH 2 (TWO) TIMES DAILY.  Dispense: 180 tablet; Refill: 1 - furosemide (LASIX) 20 MG tablet; TAKE 1 TABLET EVERY DAY AS NEEDED  Dispense: 90 tablet; Refill: 1  2. Permanent atrial fibrillation (HCC) Avoid caffeine Check INR In 1 month - CoaguChek XS/INR Waived - warfarin (COUMADIN) 7.5 MG tablet; Take 1 tablet (7.5 mg total) by mouth one time only at 6 PM.  Dispense: 90 tablet; Refill: 3 - metoprolol tartrate (LOPRESSOR) 50 MG tablet; TAKE 2 TABLETS IN MORNING AND 1 AT BEDTIME  Dispense: 270 tablet; Refill: 1  3. Hypothyroidism (acquired) - levothyroxine (SYNTHROID, LEVOTHROID) 88 MCG tablet; TAKE 1 TABLET (88 MCG TOTAL) BY MOUTH DAILY.  Dispense: 90 tablet; Refill: 2 - Thyroid Panel With TSH  4. Seizure disorder, generalized convulsive, intractable (HCC) - levETIRAcetam (KEPPRA) 500 MG tablet; Take 1 tablet (500 mg total) by mouth daily.  Dispense: 90 tablet; Refill: 1  5. BMI 36.0-36.9,adult Discussed diet and exercise for person with BMI >25 Will recheck weight in 3-6 months  6.  Hyperlipidemia, unspecified hyperlipidemia type Low fat diet - Lipid panel - atorvastatin (LIPITOR) 10 MG tablet; TAKE 1 TABLET (10 MG TOTAL) BY MOUTH DAILY AT 6 PM.  Dispense: 90 tablet; Refill: 1  7. Late effect of cerebrovascular accident  8. Metabolic syndrome Watch carbs in diet  9. Class 2 obesity due to excess calories with body mass index (BMI) of 35.0 to 35.9 in adult, unspecified whether serious comorbidity present  10. Stress incontinence Wear pads   11. Lichen sclerosus of female genitalia Avoid scratching No hot showers or baths - betamethasone dipropionate (DIPROLENE) 0.05 % cream; APPLY TO AFFECTED AREAS AS NEEDED  Dispense: 45 g; Refill: 2    Labs pending Health maintenance reviewed Diet and exercise encouraged Continue all meds Follow up  In 32month   [place] FNP

## 2017-11-08 NOTE — Patient Instructions (Signed)

## 2017-11-09 LAB — CMP14+EGFR
A/G RATIO: 1.6 (ref 1.2–2.2)
ALT: 17 IU/L (ref 0–32)
AST: 23 IU/L (ref 0–40)
Albumin: 3.8 g/dL (ref 3.5–4.7)
Alkaline Phosphatase: 57 IU/L (ref 39–117)
BUN/Creatinine Ratio: 20 (ref 12–28)
BUN: 14 mg/dL (ref 8–27)
Bilirubin Total: 0.6 mg/dL (ref 0.0–1.2)
CO2: 21 mmol/L (ref 20–29)
CREATININE: 0.71 mg/dL (ref 0.57–1.00)
Calcium: 8.8 mg/dL (ref 8.7–10.3)
Chloride: 106 mmol/L (ref 96–106)
GFR, EST AFRICAN AMERICAN: 91 mL/min/{1.73_m2} (ref >59)
GFR, EST NON AFRICAN AMERICAN: 79 mL/min/{1.73_m2} (ref >59)
Globulin, Total: 2.4 g/dL (ref 1.5–4.5)
Glucose: 91 mg/dL (ref 65–99)
Potassium: 4.3 mmol/L (ref 3.5–5.2)
Sodium: 142 mmol/L (ref 134–144)
TOTAL PROTEIN: 6.2 g/dL (ref 6.0–8.5)

## 2017-11-09 LAB — LIPID PANEL
CHOL/HDL RATIO: 3.4 ratio (ref 0.0–4.4)
Cholesterol, Total: 180 mg/dL (ref 100–199)
HDL: 53 mg/dL (ref >39)
LDL CALC: 99 mg/dL (ref 0–99)
TRIGLYCERIDES: 139 mg/dL (ref 0–149)
VLDL Cholesterol Cal: 28 mg/dL (ref 5–40)

## 2017-11-10 LAB — THYROID PANEL WITH TSH
FREE THYROXINE INDEX: 2.5 (ref 1.2–4.9)
T3 UPTAKE RATIO: 27 % (ref 24–39)
T4, Total: 9.3 ug/dL (ref 4.5–12.0)
TSH: 3.52 u[IU]/mL (ref 0.450–4.500)

## 2017-11-10 LAB — SPECIMEN STATUS REPORT

## 2017-11-14 ENCOUNTER — Encounter: Payer: Self-pay | Admitting: *Deleted

## 2017-11-14 ENCOUNTER — Ambulatory Visit (INDEPENDENT_AMBULATORY_CARE_PROVIDER_SITE_OTHER): Payer: Medicare Other | Admitting: *Deleted

## 2017-11-14 VITALS — BP 133/83 | HR 68 | Ht 62.0 in | Wt 210.0 lb

## 2017-11-14 DIAGNOSIS — Z0000 Encounter for general adult medical examination without abnormal findings: Secondary | ICD-10-CM

## 2017-11-14 DIAGNOSIS — Z780 Asymptomatic menopausal state: Secondary | ICD-10-CM

## 2017-11-14 NOTE — Progress Notes (Addendum)
Subjective:   Melissa Knight is a 84 y.o. female who presents for an Initial Medicare Annual Wellness Visit. Melissa Knight is happily married for the past 19 years. She has 3 adult sons. She enjoys crossword puzzles.   Review of Systems    Health is about the same as last year.   Cardiac Risk Factors include: advanced age (>66men, >59 women);dyslipidemia;sedentary lifestyle;obesity (BMI >30kg/m2)  Other systems negative    Objective:    Today's Vitals   11/14/17 1122  BP: 133/83  Pulse: 68  Weight: 210 lb (95.3 kg)  Height: 5\' 2"  (1.575 m)   Body mass index is 38.41 kg/m.  Advanced Directives 11/10/2016 10/12/2015 10/10/2014 09/10/2014 07/01/2012  Does Patient Have a Medical Advance Directive? No No No No Patient would like information  Would patient like information on creating a medical advance directive? Yes (MAU/Ambulatory/Procedural Areas - Information given) Yes - Educational materials given No - patient declined information Yes - Educational materials given Referral made to social work  Pre-existing out of facility DNR order (yellow form or pink MOST form) - - - - No    Current Medications (verified) Outpatient Encounter Medications as of 11/14/2017  Medication Sig  . amLODipine (NORVASC) 2.5 MG tablet Take 1 tablet (2.5 mg total) by mouth daily.  Marland Kitchen atorvastatin (LIPITOR) 10 MG tablet TAKE 1 TABLET (10 MG TOTAL) BY MOUTH DAILY AT 6 PM.  . betamethasone dipropionate (DIPROLENE) 0.05 % cream APPLY TO AFFECTED AREAS AS NEEDED  . furosemide (LASIX) 20 MG tablet TAKE 1 TABLET EVERY DAY AS NEEDED  . levETIRAcetam (KEPPRA) 500 MG tablet Take 1 tablet (500 mg total) by mouth daily.  Marland Kitchen levothyroxine (SYNTHROID, LEVOTHROID) 88 MCG tablet TAKE 1 TABLET (88 MCG TOTAL) BY MOUTH DAILY.  Marland Kitchen lisinopril (PRINIVIL,ZESTRIL) 20 MG tablet TAKE 1 TABLET (20 MG TOTAL) BY MOUTH 2 (TWO) TIMES DAILY.  . metoprolol tartrate (LOPRESSOR) 50 MG tablet TAKE 2 TABLETS IN MORNING AND 1 AT BEDTIME  . warfarin  (COUMADIN) 7.5 MG tablet Take 1 tablet (7.5 mg total) by mouth one time only at 6 PM.   No facility-administered encounter medications on file as of 11/14/2017.     Allergies (verified) Sulfa antibiotics   History: Past Medical History:  Diagnosis Date  . Acute ischemic stroke ([place]) 06/2012   acute right parietal ischemic stroke - likely cardioembolic in setting of AFib => coumadin started and followed by PCP  . Atrial fibrillation ([place]) 06/2012   Echocardiogram 06/30/12: EF 60-65%, mild MR, PASP 33.  Marland Kitchen Hyperlipidemia   . Hypertension   . Hypothyroidism   . Lichen sclerosus et atrophicus of the vulva   . Seizure ([place]) 06/2012   in the setting of acute ischemic stroke   Past Surgical History:  Procedure Laterality Date  . BLADDER SURGERY     bladder prolapse  . TUBAL LIGATION     Family History  Problem Relation Age of Onset  . Atrial fibrillation Mother        pacemaker  . Hypertension Mother   . Heart disease Sister        MI in 22s  . Stroke Sister   . Heart disease Father   . Heart disease Sister   . Heart disease Sister   . Hypertension Sister   . Anxiety disorder Sister   . Heart disease Brother   . Cancer Brother        pancreatic  . Heart disease Son   . Stroke Son 44  Social History   Socioeconomic History  . Marital status: Married    Spouse name: Not on file  . Number of children: 3  . Years of education: 57  . Highest education level: Some college, no degree  Social Needs  . Financial resource strain: Not hard at all  . Food insecurity - worry: Never true  . Food insecurity - inability: Never true  . Transportation needs - medical: No  . Transportation needs - non-medical: No  Occupational History  . Occupation: retired    Comment: Therapist, art  Tobacco Use  . Smoking status: Former Smoker    Packs/day: 1.00    Types: Cigarettes    Last attempt to quit: 09/30/1978    Years since quitting: 39.1  . Smokeless tobacco: Never Used    Substance and Sexual Activity  . Alcohol use: No  . Drug use: No  . Sexual activity: No  Other Topics Concern  . Not on file  Social History Narrative  . Not on file    Clinical Intake:  Pre-visit preparation completed: No  Pain : No/denies pain    Nutritional Status: BMI 25 -29 Overweight Diabetes: No  How often do you need to have someone help you when you read instructions, pamphlets, or other written materials from your doctor or pharmacy?: 1 - Never What is the last grade level you completed in school?: some college  Interpreter Needed?: No  Information entered by :: Chong Sicilian, RN   Activities of Daily Living In your present state of health, do you have any difficulty performing the following activities: 11/14/2017  Hearing? N  Vision? N  Comment Last exam was last with at [facility]. She plans on switching to another provider  Difficulty concentrating or making decisions? N  Walking or climbing stairs? N  Dressing or bathing? N  Doing errands, shopping? Y  Comment husband provides transportation. She doesn't drive.  Preparing Food and eating ? N  Using the Toilet? Y  Comment urge incontinence  In the past six months, have you accidently leaked urine? Y  Comment has some urge incontinence  Do you have problems with loss of bowel control? N  Managing your Medications? N  Comment has a routine  Managing your Finances? N  Housekeeping or managing your Housekeeping? N  Some recent data might be hidden     Immunizations and Health Maintenance Immunization History  Administered Date(s) Administered  . Influenza Split 07/03/2012  . Influenza, High Dose Seasonal PF 08/08/2017  . Influenza,inj,Quad PF,6+ Mos 09/19/2013, 07/10/2014, 06/25/2015, 07/07/2016  . Pneumococcal Conjugate-13 07/10/2014  . Pneumococcal Polysaccharide-23 12/28/2010  . Zoster 01/08/2015   There are no preventive care reminders to display for this patient.  Patient Care Team: Chevis Pretty, [place] as PCP - General (Nurse Practitioner) Sherren Mocha, MD as Consulting Physician (Cardiology) Sharmon Revere as Physician Assistant (Physician Assistant) Melina Schools, OD as Consulting Physician (Optometry)  No hospitalizations, ER visits, or surgeries this past year.       Assessment:   This is a routine wellness examination for [place].  Hearing/Vision screen No deficits noted during visit.   Dietary issues and exercise activities discussed: Current Exercise Habits: The patient does not participate in regular exercise at present, Exercise limited by: None identified  Goals    . Exercise 150 min/wk Moderate Activity      Depression Screen PHQ 2/9 Scores 11/08/2017 09/26/2017 08/08/2017 07/07/2017 05/24/2017 04/25/2017 01/17/2017  PHQ - 2 Score 0  0 0 0 0 0 0    Fall Risk Fall Risk  11/08/2017 09/26/2017 08/08/2017 07/07/2017 05/24/2017  Falls in the past year? Yes No No No No  Number falls in past yr: 1 - - - -  Injury with Fall? No - - - -    Is the patient's home free of loose throw rugs in walkways, pet beds, electrical cords, etc?   yes      Grab bars in the bathroom? no      Handrails on the stairs?   yes      Adequate lighting?   yes    Cognitive Function: MMSE - Mini Mental State Exam 11/14/2017 11/10/2016 10/12/2015  Orientation to time 5 5 5   Orientation to Place 5 5 5   Registration 3 3 3   Attention/ Calculation 5 5 5   Recall 3 3 3   Language- name 2 objects 2 2 2   Language- repeat 1 1 1   Language- follow 3 step command 3 3 3   Language- read & follow direction 1 1 1   Write a sentence 1 1 1   Copy design 1 1 1   Total score 30 30 30     normal exam    Screening Tests Health Maintenance  Topic Date Due  . MAMMOGRAM  04/25/2018 (Originally 12/03/2014)  . TETANUS/TDAP  11/08/2018 (Originally 10/16/1951)  . DEXA SCAN  01/02/2019  . INFLUENZA VACCINE  Completed  . PNA vac Low Risk Adult  Completed       Plan:  Keep f/u with PCP Dexa  scan at your next visit. Appt scheduled.  Increase activity level. Aim for 150 minutes a week. Move carefully to avoid falls  I have personally reviewed and noted the following in the patient's chart:   . Medical and social history . Use of alcohol, tobacco or illicit drugs  . Current medications and supplements . Functional ability and status . Nutritional status . Physical activity . Advanced directives . List of other physicians . Hospitalizations, surgeries, and ER visits in previous 12 months . Vitals . Screenings to include cognitive, depression, and falls . Referrals and appointments  In addition, I have reviewed and discussed with patient certain preventive protocols, quality metrics, and best practice recommendations. A written personalized care plan for preventive services as well as general preventive health recommendations were provided to patient.     Chong Sicilian, RN   11/14/2017   I have reviewed and agree with the above AWV documentation.   Mary-Margaret Hassell Done, FNP

## 2017-11-14 NOTE — Patient Instructions (Signed)
  Melissa Knight,  Thank you for taking time to come for your Medicare Wellness Visit. I appreciate your ongoing commitment to your health goals. Please review the following plan we discussed and let me know if I can assist you in the future.    These are the goals we discussed: Goals    . Exercise 150 min/wk Moderate Activity       This is a list of the screening recommended for you and due dates:  Health Maintenance  Topic Date Due  . Mammogram  04/25/2018*  . Tetanus Vaccine  11/08/2018*  . DEXA scan (bone density measurement)  01/02/2019  . Flu Shot  Completed  . Pneumonia vaccines  Completed  *Topic was postponed. The date shown is not the original due date.   Check on price of Tdap (tetanus shot) at your next visit

## 2017-12-25 ENCOUNTER — Encounter: Payer: Self-pay | Admitting: Nurse Practitioner

## 2017-12-25 ENCOUNTER — Ambulatory Visit (INDEPENDENT_AMBULATORY_CARE_PROVIDER_SITE_OTHER): Payer: Medicare Other | Admitting: Nurse Practitioner

## 2017-12-25 VITALS — BP 157/96 | HR 53 | Temp 97.0°F | Ht 62.0 in | Wt 207.0 lb

## 2017-12-25 DIAGNOSIS — I482 Chronic atrial fibrillation: Secondary | ICD-10-CM | POA: Diagnosis not present

## 2017-12-25 DIAGNOSIS — I4821 Permanent atrial fibrillation: Secondary | ICD-10-CM

## 2017-12-25 LAB — COAGUCHEK XS/INR WAIVED
INR: 2.3 — AB (ref 0.9–1.1)
Prothrombin Time: 27.7 s

## 2017-12-25 NOTE — Progress Notes (Signed)
Subjective:   Patient in today for INR only   Indication: atrial fibrillation Bleeding signs/symptoms: None Thromboembolic signs/symptoms: None  Missed Coumadin doses: None Medication changes: no Dietary changes: no Bacterial/viral infection: no Other concerns: no  The following portions of the patient's history were reviewed and updated as appropriate: allergies, current medications, past family history, past medical history, past social history, past surgical history and problem list.  Review of Systems Pertinent items noted in HPI and remainder of comprehensive ROS otherwise negative.   Objective:    INR Today: 2.3 Current dose: coumadin 3.75mg  daily    Assessment:    Therapeutic INR for goal of 2-3   Plan:    1. New dose: no change   2. Next INR: 1 month    Mary-Margaret Hassell Done, FNP

## 2018-02-27 ENCOUNTER — Encounter: Payer: Self-pay | Admitting: Nurse Practitioner

## 2018-02-27 ENCOUNTER — Ambulatory Visit (INDEPENDENT_AMBULATORY_CARE_PROVIDER_SITE_OTHER): Payer: Medicare Other | Admitting: Nurse Practitioner

## 2018-02-27 ENCOUNTER — Ambulatory Visit (INDEPENDENT_AMBULATORY_CARE_PROVIDER_SITE_OTHER): Payer: Medicare Other

## 2018-02-27 VITALS — BP 142/88 | HR 69 | Temp 96.9°F | Ht 62.0 in | Wt 206.0 lb

## 2018-02-27 DIAGNOSIS — Z6835 Body mass index (BMI) 35.0-35.9, adult: Secondary | ICD-10-CM

## 2018-02-27 DIAGNOSIS — Z6836 Body mass index (BMI) 36.0-36.9, adult: Secondary | ICD-10-CM | POA: Diagnosis not present

## 2018-02-27 DIAGNOSIS — E8881 Metabolic syndrome: Secondary | ICD-10-CM | POA: Diagnosis not present

## 2018-02-27 DIAGNOSIS — Z13820 Encounter for screening for osteoporosis: Secondary | ICD-10-CM | POA: Diagnosis not present

## 2018-02-27 DIAGNOSIS — I10 Essential (primary) hypertension: Secondary | ICD-10-CM | POA: Diagnosis not present

## 2018-02-27 DIAGNOSIS — E785 Hyperlipidemia, unspecified: Secondary | ICD-10-CM | POA: Diagnosis not present

## 2018-02-27 DIAGNOSIS — N393 Stress incontinence (female) (male): Secondary | ICD-10-CM

## 2018-02-27 DIAGNOSIS — I4821 Permanent atrial fibrillation: Secondary | ICD-10-CM

## 2018-02-27 DIAGNOSIS — I482 Chronic atrial fibrillation: Secondary | ICD-10-CM

## 2018-02-27 DIAGNOSIS — G40319 Generalized idiopathic epilepsy and epileptic syndromes, intractable, without status epilepticus: Secondary | ICD-10-CM

## 2018-02-27 DIAGNOSIS — E6609 Other obesity due to excess calories: Secondary | ICD-10-CM

## 2018-02-27 DIAGNOSIS — E039 Hypothyroidism, unspecified: Secondary | ICD-10-CM | POA: Diagnosis not present

## 2018-02-27 DIAGNOSIS — Z780 Asymptomatic menopausal state: Secondary | ICD-10-CM | POA: Diagnosis not present

## 2018-02-27 LAB — CMP14+EGFR
A/G RATIO: 1.8 (ref 1.2–2.2)
ALBUMIN: 4 g/dL (ref 3.5–4.7)
ALK PHOS: 56 IU/L (ref 39–117)
ALT: 15 IU/L (ref 0–32)
AST: 24 IU/L (ref 0–40)
BILIRUBIN TOTAL: 0.7 mg/dL (ref 0.0–1.2)
BUN / CREAT RATIO: 14 (ref 12–28)
BUN: 12 mg/dL (ref 8–27)
CHLORIDE: 104 mmol/L (ref 96–106)
CO2: 24 mmol/L (ref 20–29)
Calcium: 9 mg/dL (ref 8.7–10.3)
Creatinine, Ser: 0.86 mg/dL (ref 0.57–1.00)
GFR calc non Af Amer: 63 mL/min/{1.73_m2} (ref >59)
GFR, EST AFRICAN AMERICAN: 72 mL/min/{1.73_m2} (ref >59)
GLUCOSE: 86 mg/dL (ref 65–99)
Globulin, Total: 2.2 g/dL (ref 1.5–4.5)
POTASSIUM: 4.1 mmol/L (ref 3.5–5.2)
Sodium: 142 mmol/L (ref 134–144)
Total Protein: 6.2 g/dL (ref 6.0–8.5)

## 2018-02-27 LAB — COAGUCHEK XS/INR WAIVED
INR: 2.4 — ABNORMAL HIGH (ref 0.9–1.1)
PROTHROMBIN TIME: 28.3 s

## 2018-02-27 LAB — LIPID PANEL
CHOLESTEROL TOTAL: 189 mg/dL (ref 100–199)
Chol/HDL Ratio: 3.8 ratio (ref 0.0–4.4)
HDL: 50 mg/dL (ref >39)
LDL Calculated: 103 mg/dL — ABNORMAL HIGH (ref 0–99)
Triglycerides: 182 mg/dL — ABNORMAL HIGH (ref 0–149)
VLDL CHOLESTEROL CAL: 36 mg/dL (ref 5–40)

## 2018-02-27 MED ORDER — ATORVASTATIN CALCIUM 10 MG PO TABS: ORAL_TABLET | ORAL | 1 refills | Status: DC

## 2018-02-27 MED ORDER — FUROSEMIDE 20 MG PO TABS: ORAL_TABLET | ORAL | 1 refills | Status: DC

## 2018-02-27 MED ORDER — LISINOPRIL 20 MG PO TABS: ORAL_TABLET | ORAL | 1 refills | Status: DC

## 2018-02-27 MED ORDER — METOPROLOL TARTRATE 50 MG PO TABS: ORAL_TABLET | ORAL | 1 refills | Status: DC

## 2018-02-27 MED ORDER — LEVETIRACETAM 500 MG PO TABS: 500.0000 mg | ORAL_TABLET | Freq: Every day | ORAL | 1 refills | Status: DC

## 2018-02-27 MED ORDER — AMLODIPINE BESYLATE 2.5 MG PO TABS: 2.5000 mg | ORAL_TABLET | Freq: Every day | ORAL | 1 refills | Status: DC

## 2018-02-27 MED ORDER — LEVOTHYROXINE SODIUM 88 MCG PO TABS: ORAL_TABLET | ORAL | 2 refills | Status: DC

## 2018-02-27 NOTE — Progress Notes (Signed)
Subjective:    Patient ID: Melissa Knight, female    DOB: 07-18-1933, 84 y.o.   MRN: [id number]   Chief Complaint: Chief complaint of chronic issues  HPI:  1. Permanent atrial fibrillation ([place])  She is still on coumadin. Denies any palpitations. See anticoag documantation.  2. Essential hypertension   no c/o chest pain, sob or headaches. Does not check blood pressure at home. Blood presure is always high when she comes in office BP Readings from Last 3 Encounters:  12/25/17 (!) 157/96  11/14/17 133/83  11/08/17 136/78     3. Hyperlipidemia, unspecified hyperlipidemia type  Does not watch diet and does very little exercise  4. Hypothyroidism (acquired)  No problems that she is aware of  5. Stress incontinence  Has to wear depends daily because she does have frequent accidents. Sh edoes not feel that it is bad enough to go on meds for.  6. Class 2 obesity due to excess calories with body mass index (BMI) of 35.0 to 35.9 in adult, unspecified whether serious comorbidity present   7. Metabolic syndrome  blood sugar have been elevated but HGBA1c .has been on normal limits. She does not check blood sugar sat home  8. BMI 36.0-36.9,adult  No recent weight changes    Outpatient Encounter Medications as of 02/27/2018  Medication Sig  . amLODipine (NORVASC) 2.5 MG tablet Take 1 tablet (2.5 mg total) by mouth daily.  Marland Kitchen atorvastatin (LIPITOR) 10 MG tablet TAKE 1 TABLET (10 MG TOTAL) BY MOUTH DAILY AT 6 PM.  . betamethasone dipropionate (DIPROLENE) 0.05 % cream APPLY TO AFFECTED AREAS AS NEEDED  . furosemide (LASIX) 20 MG tablet TAKE 1 TABLET EVERY DAY AS NEEDED  . levETIRAcetam (KEPPRA) 500 MG tablet Take 1 tablet (500 mg total) by mouth daily.  Marland Kitchen levothyroxine (SYNTHROID, LEVOTHROID) 88 MCG tablet TAKE 1 TABLET (88 MCG TOTAL) BY MOUTH DAILY.  Marland Kitchen lisinopril (PRINIVIL,ZESTRIL) 20 MG tablet TAKE 1 TABLET (20 MG TOTAL) BY MOUTH 2 (TWO) TIMES DAILY.  . metoprolol tartrate (LOPRESSOR) 50 MG  tablet TAKE 2 TABLETS IN MORNING AND 1 AT BEDTIME  . warfarin (COUMADIN) 7.5 MG tablet Take 1 tablet (7.5 mg total) by mouth one time only at 6 PM.       New complaints: None today  Social history: Lives with husband who is in pretty good help   Review of Systems  Constitutional: Negative for activity change and appetite change.  HENT: Negative.   Eyes: Negative for pain.  Respiratory: Negative for shortness of breath.   Cardiovascular: Negative for chest pain, palpitations and leg swelling.  Gastrointestinal: Negative for abdominal pain.  Endocrine: Negative for polydipsia.  Genitourinary: Negative.   Skin: Negative for rash.  Neurological: Negative for dizziness, weakness and headaches.  Hematological: Does not bruise/bleed easily.  Psychiatric/Behavioral: Negative.   All other systems reviewed and are negative.      Objective:   Physical Exam  Constitutional: She is oriented to person, place, and time.  HENT:  Head: Normocephalic.  Nose: Nose normal.  Mouth/Throat: Oropharynx is clear and moist.  Eyes: Pupils are equal, round, and reactive to light. EOM are normal.  Neck: Normal range of motion. Neck supple. No JVD present. Carotid bruit is not present.  Cardiovascular: Normal rate, normal heart sounds and intact distal pulses.  No murmur heard. Pulmonary/Chest: Effort normal and breath sounds normal. No respiratory distress. She has no wheezes. She has no rales. She exhibits no tenderness.  Abdominal: Soft. Normal appearance, normal  aorta and bowel sounds are normal. She exhibits no distension, no abdominal bruit, no pulsatile midline mass and no mass. There is no splenomegaly or hepatomegaly. There is no tenderness.  Musculoskeletal: Normal range of motion. She exhibits no edema.  Lymphadenopathy:    She has no cervical adenopathy.  Neurological: She is alert and oriented to person, place, and time. She has normal reflexes.  Skin: Skin is warm and dry.    Psychiatric: She has a normal mood and affect. Her behavior is normal. Judgment and thought content normal.   BP (!) 142/88 (BP Location: Left Arm, Cuff Size: Normal)   Pulse 69   Temp (!) 96.9 F (36.1 C) (Oral)   Ht 5' 2"  (1.575 m)   Wt 206 lb (93.4 kg)   BMI 37.68 kg/m   EKG- atrial fib with slow ventricular Ezzard Flax, FNP      Assessment & Plan:  Melissa Knight comes in today with chief complaint of Medical Management of Chronic Issues   Diagnosis and orders addressed:  1. Permanent atrial fibrillation (HCC) Avoid caffeine - CoaguChek XS/INR Waived - metoprolol tartrate (LOPRESSOR) 50 MG tablet; TAKE 2 TABLETS IN MORNING AND 1 AT BEDTIME  Dispense: 270 tablet; Refill: 1  2. Essential hypertension Low sodium diet - CMP14+EGFR - amLODipine (NORVASC) 2.5 MG tablet; Take 1 tablet (2.5 mg total) by mouth daily.  Dispense: 90 tablet; Refill: 1 - lisinopril (PRINIVIL,ZESTRIL) 20 MG tablet; TAKE 1 TABLET (20 MG TOTAL) BY MOUTH 2 (TWO) TIMES DAILY.  Dispense: 180 tablet; Refill: 1 - furosemide (LASIX) 20 MG tablet; TAKE 1 TABLET EVERY DAY AS NEEDED  Dispense: 90 tablet; Refill: 1  3. Hyperlipidemia, unspecified hyperlipidemia type Low fat diet - Lipid panel - atorvastatin (LIPITOR) 10 MG tablet; TAKE 1 TABLET (10 MG TOTAL) BY MOUTH DAILY AT 6 PM.  Dispense: 90 tablet; Refill: 1  4. Hypothyroidism (acquired) - levothyroxine (SYNTHROID, LEVOTHROID) 88 MCG tablet; TAKE 1 TABLET (88 MCG TOTAL) BY MOUTH DAILY.  Dispense: 90 tablet; Refill: 2 - Thyroid Panel With TSH  5. Stress incontinence  6. Class 2 obesity due to excess calories with body mass index (BMI) of 35.0 to 35.9 in adult, unspecified whether serious comorbidity present  7. Metabolic syndrome Watch carbs in diet  8. BMI 36.0-36.9,adult Discussed diet and exercise for person with BMI >25 Will recheck weight in 3-6 months  9. Screening for osteoporosis Weight bearing exercises  10. Seizure  disorder, generalized convulsive, intractable (HCC) - levETIRAcetam (KEPPRA) 500 MG tablet; Take 1 tablet (500 mg total) by mouth daily.  Dispense: 90 tablet; Refill: 1   Labs pending Health Maintenance reviewed Diet and exercise encouraged  Follow up plan: 3 months   Mary-Margaret Hassell Done, FNP

## 2018-02-27 NOTE — Patient Instructions (Signed)
Bone Health Bones protect organs, store calcium, and anchor muscles. Good health habits, such as eating nutritious foods and exercising regularly, are important for maintaining healthy bones. They can also help to prevent a condition that causes bones to lose density and become weak and brittle (osteoporosis). Why is bone mass important? Bone mass refers to the amount of bone tissue that you have. The higher your bone mass, the stronger your bones. An important step toward having healthy bones throughout life is to have strong and dense bones during childhood. A young adult who has a high bone mass is more likely to have a high bone mass later in life. Bone mass at its greatest it is called peak bone mass. A large decline in bone mass occurs in older adults. In women, it occurs about the time of menopause. During this time, it is important to practice good health habits, because if more bone is lost than what is replaced, the bones will become less healthy and more likely to break (fracture). If you find that you have a low bone mass, you may be able to prevent osteoporosis or further bone loss by changing your diet and lifestyle. How can I find out if my bone mass is low? Bone mass can be measured with an X-ray test that is called a bone mineral density (BMD) test. This test is recommended for all women who are age 65 or older. It may also be recommended for men who are age 70 or older, or for people who are more likely to develop osteoporosis due to:  Having bones that break easily.  Having a long-term disease that weakens bones, such as kidney disease or rheumatoid arthritis.  Having menopause earlier than normal.  Taking medicine that weakens bones, such as steroids, thyroid hormones, or hormone treatment for breast cancer or prostate cancer.  Smoking.  Drinking three or more alcoholic drinks each day.  What are the nutritional recommendations for healthy bones? To have healthy bones, you  need to get enough of the right minerals and vitamins. Most nutrition experts recommend getting these nutrients from the foods that you eat. Nutritional recommendations vary from person to person. Ask your health care provider what is healthy for you. Here are some general guidelines. Calcium Recommendations Calcium is the most important (essential) mineral for bone health. Most people can get enough calcium from their diet, but supplements may be recommended for people who are at risk for osteoporosis. Good sources of calcium include:  Dairy products, such as low-fat or nonfat milk, cheese, and yogurt.  Dark green leafy vegetables, such as bok choy and broccoli.  Calcium-fortified foods, such as orange juice, cereal, bread, soy beverages, and tofu products.  Nuts, such as almonds.  Follow these recommended amounts for daily calcium intake:  Children, age 1?3: 700 mg.  Children, age 4?8: 1,000 mg.  Children, age 9?13: 1,300 mg.  Teens, age 14?18: 1,300 mg.  Adults, age 19?50: 1,000 mg.  Adults, age 51?70: ? Men: 1,000 mg. ? Women: 1,200 mg.  Adults, age 71 or older: 1,200 mg.  Pregnant and breastfeeding females: ? Teens: 1,300 mg. ? Adults: 1,000 mg.  Vitamin D Recommendations Vitamin D is the most essential vitamin for bone health. It helps the body to absorb calcium. Sunlight stimulates the skin to make vitamin D, so be sure to get enough sunlight. If you live in a cold climate or you do not get outside often, your health care provider may recommend that you take vitamin   D supplements. Good sources of vitamin D in your diet include:  Egg yolks.  Saltwater fish.  Milk and cereal fortified with vitamin D.  Follow these recommended amounts for daily vitamin D intake:  Children and teens, age 1?18: 600 international units.  Adults, age 50 or younger: 400-800 international units.  Adults, age 51 or older: 800-1,000 international units.  Other Nutrients Other nutrients  for bone health include:  Phosphorus. This mineral is found in meat, poultry, dairy foods, nuts, and legumes. The recommended daily intake for adult men and adult women is 700 mg.  Magnesium. This mineral is found in seeds, nuts, dark green vegetables, and legumes. The recommended daily intake for adult men is 400?420 mg. For adult women, it is 310?320 mg.  Vitamin K. This vitamin is found in green leafy vegetables. The recommended daily intake is 120 mg for adult men and 90 mg for adult women.  What type of physical activity is best for building and maintaining healthy bones? Weight-bearing and strength-building activities are important for building and maintaining peak bone mass. Weight-bearing activities cause muscles and bones to work against gravity. Strength-building activities increases muscle strength that supports bones. Weight-bearing and muscle-building activities include:  Walking and hiking.  Jogging and running.  Dancing.  Gym exercises.  Lifting weights.  Tennis and racquetball.  Climbing stairs.  Aerobics.  Adults should get at least 30 minutes of moderate physical activity on most days. Children should get at least 60 minutes of moderate physical activity on most days. Ask your health care provide what type of exercise is best for you. Where can I find more information? For more information, check out the following websites:  National Osteoporosis Foundation: http://nof.org/learn/basics  National Institutes of Health: http://www.niams.nih.gov/Health_Info/Bone/Bone_Health/bone_health_for_life.asp  This information is not intended to replace advice given to you by your health care provider. Make sure you discuss any questions you have with your health care provider. Document Released: 11/19/2003 Document Revised: 03/18/2016 Document Reviewed: 09/03/2014 Elsevier Interactive Patient Education  2018 Elsevier Inc.  

## 2018-02-28 DIAGNOSIS — Z13820 Encounter for screening for osteoporosis: Secondary | ICD-10-CM | POA: Diagnosis not present

## 2018-02-28 LAB — THYROID PANEL WITH TSH
Free Thyroxine Index: 3 (ref 1.2–4.9)
T3 Uptake Ratio: 28 % (ref 24–39)
T4, Total: 10.8 ug/dL (ref 4.5–12.0)
TSH: 2.07 u[IU]/mL (ref 0.450–4.500)

## 2018-02-28 LAB — SPECIMEN STATUS REPORT

## 2018-04-10 ENCOUNTER — Ambulatory Visit (INDEPENDENT_AMBULATORY_CARE_PROVIDER_SITE_OTHER): Payer: Medicare Other | Admitting: Nurse Practitioner

## 2018-04-10 ENCOUNTER — Encounter: Payer: Self-pay | Admitting: Nurse Practitioner

## 2018-04-10 VITALS — BP 142/80 | HR 55 | Temp 97.5°F | Ht 62.0 in | Wt 207.5 lb

## 2018-04-10 DIAGNOSIS — I482 Chronic atrial fibrillation: Secondary | ICD-10-CM | POA: Diagnosis not present

## 2018-04-10 DIAGNOSIS — I4821 Permanent atrial fibrillation: Secondary | ICD-10-CM

## 2018-04-10 LAB — COAGUCHEK XS/INR WAIVED
INR: 3.5 — ABNORMAL HIGH (ref 0.9–1.1)
PROTHROMBIN TIME: 42 s

## 2018-04-10 NOTE — Progress Notes (Signed)
Subjective:   Chief Complaint: INR recheck   Indication: atrial fibrillation Bleeding signs/symptoms: None Thromboembolic signs/symptoms: None  Missed Coumadin doses: None Medication changes: no Dietary changes: no Bacterial/viral infection: no Other concerns: no  The following portions of the patient's history were reviewed and updated as appropriate: allergies, current medications, past family history, past medical history, past social history, past surgical history and problem list.  Review of Systems Pertinent items noted in HPI and remainder of comprehensive ROS otherwise negative.   Objective:    INR Today: 3.5 Current dose: coumadin 3.75mg  daily    Assessment:    Supratherapeutic INR for goal of 2-3   Plan:    1. New dose: coumadin hold today and  Wednesday then back on nrmal dose of 3.75mg  daily   2. Next INR: 2 weeks    Mary-Margaret Hassell Done, FNP

## 2018-04-25 ENCOUNTER — Ambulatory Visit (INDEPENDENT_AMBULATORY_CARE_PROVIDER_SITE_OTHER): Payer: Medicare Other | Admitting: Nurse Practitioner

## 2018-04-25 ENCOUNTER — Encounter: Payer: Self-pay | Admitting: Nurse Practitioner

## 2018-04-25 VITALS — BP 143/83 | HR 68 | Temp 97.0°F | Ht 62.0 in | Wt 208.0 lb

## 2018-04-25 DIAGNOSIS — I4821 Permanent atrial fibrillation: Secondary | ICD-10-CM

## 2018-04-25 DIAGNOSIS — I482 Chronic atrial fibrillation: Secondary | ICD-10-CM | POA: Diagnosis not present

## 2018-04-25 LAB — COAGUCHEK XS/INR WAIVED
INR: 2.3 — AB (ref 0.9–1.1)
Prothrombin Time: 27.5 s

## 2018-04-25 NOTE — Progress Notes (Signed)
Subjective:   CHIEF COMPLAINT: INR recheck   Indication: atrial fibrillation Bleeding signs/symptoms: None Thromboembolic signs/symptoms: None  Missed Coumadin doses: None Medication changes: no Dietary changes: no Bacterial/viral infection: no Other concerns: no  The following portions of the patient's history were reviewed and updated as appropriate: allergies, current medications, past family history, past medical history, past social history, past surgical history and problem list.  Review of Systems Pertinent items noted in HPI and remainder of comprehensive ROS otherwise negative.   Objective:    INR Today: 2.3 Current dose: coumadin 3.75mg  daily    Assessment:    Therapeutic INR for goal of 2-3   Plan:    1. New dose: no change   2. Next INR: 1 month    Mary-Margaret Hassell Done, FNP

## 2018-05-29 ENCOUNTER — Ambulatory Visit (INDEPENDENT_AMBULATORY_CARE_PROVIDER_SITE_OTHER): Payer: Medicare Other | Admitting: Nurse Practitioner

## 2018-05-29 ENCOUNTER — Encounter: Payer: Self-pay | Admitting: Nurse Practitioner

## 2018-05-29 VITALS — BP 139/85 | HR 55 | Temp 97.4°F | Ht 62.0 in | Wt 207.0 lb

## 2018-05-29 DIAGNOSIS — E039 Hypothyroidism, unspecified: Secondary | ICD-10-CM | POA: Diagnosis not present

## 2018-05-29 DIAGNOSIS — Z6836 Body mass index (BMI) 36.0-36.9, adult: Secondary | ICD-10-CM

## 2018-05-29 DIAGNOSIS — E785 Hyperlipidemia, unspecified: Secondary | ICD-10-CM

## 2018-05-29 DIAGNOSIS — I482 Chronic atrial fibrillation: Secondary | ICD-10-CM

## 2018-05-29 DIAGNOSIS — I10 Essential (primary) hypertension: Secondary | ICD-10-CM | POA: Diagnosis not present

## 2018-05-29 DIAGNOSIS — I4821 Permanent atrial fibrillation: Secondary | ICD-10-CM

## 2018-05-29 DIAGNOSIS — G40319 Generalized idiopathic epilepsy and epileptic syndromes, intractable, without status epilepticus: Secondary | ICD-10-CM | POA: Diagnosis not present

## 2018-05-29 LAB — COAGUCHEK XS/INR WAIVED
INR: 2.2 — AB (ref 0.9–1.1)
PROTHROMBIN TIME: 26.9 s

## 2018-05-29 NOTE — Progress Notes (Signed)
Subjective:    Patient ID: Melissa Knight, female    DOB: Apr 09, 1933, 84 y.o.   MRN: [id number]   Chief Complaint: Medical Management of Chronic Issues   HPI:  1. Permanent atrial fibrillation (HCC)  -some palpitations -has not missed dose of Coumadin -does not eat green leafy veggies   Indication: CVA Bleeding signs/symptoms: None Thromboembolic signs/symptoms: None  Missed Coumadin doses: None Medication changes: no Dietary changes: no Bacterial/viral infection: no Other concerns: no      2. Essential hypertension  -does not check BP at home -does not take lasix until she feels like she is swelling -takes lasix maybe once a month   3. Hyperlipidemia, unspecified hyperlipidemia type  -takes Lipitor -not watching diet   4. BMI 36.0-36.9,adult  -does not exercise but does like to walk   5. Seizure disorder, generalized convulsive, intractable ([place])  -has not had a seizure in 6 years, still takes Keppra but does not see a neurologist  6. Hypothyroidism (acquired)  -synthroid - not having any problems that she is aware    Outpatient Encounter Medications as of 05/29/2018  Medication Sig  . amLODipine (NORVASC) 2.5 MG tablet Take 1 tablet (2.5 mg total) by mouth daily.  Marland Kitchen atorvastatin (LIPITOR) 10 MG tablet TAKE 1 TABLET (10 MG TOTAL) BY MOUTH DAILY AT 6 PM.  . betamethasone dipropionate (DIPROLENE) 0.05 % cream APPLY TO AFFECTED AREAS AS NEEDED  . furosemide (LASIX) 20 MG tablet TAKE 1 TABLET EVERY DAY AS NEEDED  . levETIRAcetam (KEPPRA) 500 MG tablet Take 1 tablet (500 mg total) by mouth daily.  Marland Kitchen levothyroxine (SYNTHROID, LEVOTHROID) 88 MCG tablet TAKE 1 TABLET (88 MCG TOTAL) BY MOUTH DAILY.  Marland Kitchen lisinopril (PRINIVIL,ZESTRIL) 20 MG tablet TAKE 1 TABLET (20 MG TOTAL) BY MOUTH 2 (TWO) TIMES DAILY.  . metoprolol tartrate (LOPRESSOR) 50 MG tablet TAKE 2 TABLETS IN MORNING AND 1 AT BEDTIME  . warfarin (COUMADIN) 7.5 MG tablet Take 1 tablet (7.5 mg total) by mouth one  time only at 6 PM.   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  HENT: Negative for ear discharge, ear pain, postnasal drip and rhinorrhea.   Eyes: Negative for pain, discharge and itching.  Respiratory: Positive for cough (first thing in the AM). Negative for chest tightness, shortness of breath and wheezing.   Cardiovascular: Positive for leg swelling. Negative for chest pain and palpitations.  Gastrointestinal: Positive for constipation (has a BM daily). Negative for abdominal pain, diarrhea, nausea and vomiting.  Endocrine: Negative for cold intolerance and heat intolerance.  Genitourinary: Positive for urgency (urge incontinence). Negative for dysuria and enuresis.  Musculoskeletal: Negative for back pain, joint swelling and neck pain.  Skin: Negative for color change and rash.  Allergic/Immunologic: Negative for environmental allergies and food allergies.  Neurological: Negative for dizziness, speech difficulty, numbness and headaches.  Hematological: Does not bruise/bleed easily.  Psychiatric/Behavioral: Negative for self-injury and sleep disturbance.       Objective:   Physical Exam  Constitutional: She is oriented to person, place, and time. She appears well-developed and well-nourished. No distress.  HENT:  Head: Normocephalic.  Nose: Nose normal.  Mouth/Throat: Oropharynx is clear and moist.  Eyes: Pupils are equal, round, and reactive to light. EOM are normal.  Neck: Normal range of motion. Neck supple. No JVD  present. Carotid bruit is not present.  Cardiovascular: Normal rate, regular rhythm, normal heart sounds and intact distal pulses.  Pulmonary/Chest: Effort normal and breath sounds normal. No respiratory distress. She has no wheezes. She has no rales.  She exhibits no tenderness.  Abdominal: Soft. Normal appearance, normal aorta and bowel sounds are normal. She exhibits no distension, no abdominal bruit, no pulsatile midline mass and no mass. There is no splenomegaly or hepatomegaly. There is no tenderness.  Musculoskeletal: Normal range of motion. She exhibits edema (2+ left lower ext  +1 on ritgh lower ext).  Lymphadenopathy:    She has no cervical adenopathy.  Neurological: She is alert and oriented to person, place, and time. She has normal reflexes.  Skin: Skin is warm and dry.  Dark discoloration of bil lower ext.  Psychiatric: She has a normal mood and affect. Her behavior is normal. Judgment and thought content normal.  Nursing note and vitals reviewed.  BP 139/85   Pulse (!) 55   Temp (!) 97.4 F (36.3 C) (Oral)   Ht 5' 2"  (1.575 m)   Wt 207 lb (93.9 kg)   BMI 37.86 kg/m   INR 2.2 today    Assessment & Plan:  Melissa Knight comes in today with chief complaint of Medical Management of Chronic Issues   Diagnosis and orders addressed:  1. Permanent atrial fibrillation (HCC) -continue Coumadin & metoprolol - CoaguChek XS/INR Waived -encouraged follow up with Cardiology  2. Essential hypertension -Continue lisinopril, amlodipine & lasix - CMP14+EGFR -check BP at home and log results  3. Hyperlipidemia, unspecified hyperlipidemia type -continue Lipitor - Lipid panel -low fat diet  4. BMI 36.0-36.9,adult -dietary and exercise changes  5. Seizure disorder, generalized convulsive, intractable ([place]) -continue Keppra -encouraged follow up with Neuro  6. Hypothyroidism (acquired) -thyroid panel -continue levothyroxine  * recheck INR in 1 onth- continue coumadin 3.53m daily  Labs pending Health Maintenance reviewed Diet and exercise encouraged  Follow up plan: 3 months   Melissa MHassell Done FNP

## 2018-05-29 NOTE — Patient Instructions (Signed)

## 2018-05-30 LAB — CMP14+EGFR
ALK PHOS: 50 IU/L (ref 39–117)
ALT: 15 IU/L (ref 0–32)
AST: 26 IU/L (ref 0–40)
Albumin/Globulin Ratio: 2 (ref 1.2–2.2)
Albumin: 4 g/dL (ref 3.5–4.7)
BILIRUBIN TOTAL: 0.8 mg/dL (ref 0.0–1.2)
BUN/Creatinine Ratio: 17 (ref 12–28)
BUN: 14 mg/dL (ref 8–27)
CHLORIDE: 104 mmol/L (ref 96–106)
CO2: 22 mmol/L (ref 20–29)
Calcium: 9 mg/dL (ref 8.7–10.3)
Creatinine, Ser: 0.84 mg/dL (ref 0.57–1.00)
GFR calc Af Amer: 74 mL/min/{1.73_m2} (ref >59)
GFR calc non Af Amer: 64 mL/min/{1.73_m2} (ref >59)
GLUCOSE: 88 mg/dL (ref 65–99)
Globulin, Total: 2 g/dL (ref 1.5–4.5)
Potassium: 4.1 mmol/L (ref 3.5–5.2)
Sodium: 144 mmol/L (ref 134–144)
Total Protein: 6 g/dL (ref 6.0–8.5)

## 2018-05-30 LAB — LIPID PANEL
CHOL/HDL RATIO: 3.7 ratio (ref 0.0–4.4)
CHOLESTEROL TOTAL: 184 mg/dL (ref 100–199)
HDL: 50 mg/dL (ref >39)
LDL Calculated: 102 mg/dL — ABNORMAL HIGH (ref 0–99)
Triglycerides: 161 mg/dL — ABNORMAL HIGH (ref 0–149)
VLDL CHOLESTEROL CAL: 32 mg/dL (ref 5–40)

## 2018-05-31 LAB — SPECIMEN STATUS REPORT

## 2018-05-31 LAB — THYROID PANEL WITH TSH
Free Thyroxine Index: 2.9 (ref 1.2–4.9)
T3 UPTAKE RATIO: 27 % (ref 24–39)
T4 TOTAL: 10.6 ug/dL (ref 4.5–12.0)
TSH: 2.54 u[IU]/mL (ref 0.450–4.500)

## 2018-07-03 ENCOUNTER — Ambulatory Visit (INDEPENDENT_AMBULATORY_CARE_PROVIDER_SITE_OTHER): Payer: Medicare Other | Admitting: Nurse Practitioner

## 2018-07-03 ENCOUNTER — Encounter: Payer: Self-pay | Admitting: Nurse Practitioner

## 2018-07-03 VITALS — BP 143/88 | HR 56 | Temp 97.0°F | Ht 62.0 in | Wt 207.0 lb

## 2018-07-03 DIAGNOSIS — Z23 Encounter for immunization: Secondary | ICD-10-CM | POA: Diagnosis not present

## 2018-07-03 DIAGNOSIS — I4821 Permanent atrial fibrillation: Secondary | ICD-10-CM | POA: Diagnosis not present

## 2018-07-03 LAB — COAGUCHEK XS/INR WAIVED
INR: 2.1 — ABNORMAL HIGH (ref 0.9–1.1)
PROTHROMBIN TIME: 24.9 s

## 2018-07-03 NOTE — Progress Notes (Signed)
Subjective:   Chief Complant: INR recheck   Indication: atrial fibrillation Bleeding signs/symptoms: None Thromboembolic signs/symptoms: None  Missed Coumadin doses: None Medication changes: no Dietary changes: no Bacterial/viral infection: no Other concerns: no  The following portions of the patient's history were reviewed and updated as appropriate: allergies, current medications, past family history, past medical history, past social history, past surgical history and problem list.  Review of Systems Pertinent items noted in HPI and remainder of comprehensive ROS otherwise negative.   Objective:    INR Today: 2.1 Current dose: coumadin 3.75mg  daily    Assessment:    Therapeutic INR for goal of 2-3   Plan:    1. New dose: no change   2. Next INR: 1 month    Mary-Margaret Hassell Done, FNP

## 2018-09-24 ENCOUNTER — Ambulatory Visit (INDEPENDENT_AMBULATORY_CARE_PROVIDER_SITE_OTHER): Payer: Medicare Other | Admitting: Nurse Practitioner

## 2018-09-24 ENCOUNTER — Encounter: Payer: Self-pay | Admitting: Nurse Practitioner

## 2018-09-24 VITALS — BP 125/75 | HR 54 | Temp 97.0°F | Ht 62.0 in | Wt 208.0 lb

## 2018-09-24 DIAGNOSIS — I4821 Permanent atrial fibrillation: Secondary | ICD-10-CM

## 2018-09-24 LAB — COAGUCHEK XS/INR WAIVED
INR: 2.9 — ABNORMAL HIGH (ref 0.9–1.1)
Prothrombin Time: 34.4 s

## 2018-09-24 NOTE — Progress Notes (Signed)
Subjective:   Chief Complaint: INR recheck   Indication: atrial fibrillation Bleeding signs/symptoms: None Thromboembolic signs/symptoms: None  Missed Coumadin doses: None Medication changes: no Dietary changes: no Bacterial/viral infection: no Other concerns: no  The following portions of the patient's history were reviewed and updated as appropriate: allergies, current medications, past family history, past medical history, past social history, past surgical history and problem list.  Review of Systems Pertinent items noted in HPI and remainder of comprehensive ROS otherwise negative.   Objective:    INR Today: 2.9 Current dose: coumadin 3.75mg  daily    Assessment:    Therapeutic INR for goal of 2-3   Plan:    1. New dose: no change   2. Next INR: 1 month    Mary-Margaret Hassell Done, FNP

## 2018-10-25 ENCOUNTER — Encounter: Payer: Self-pay | Admitting: Nurse Practitioner

## 2018-10-25 ENCOUNTER — Ambulatory Visit (INDEPENDENT_AMBULATORY_CARE_PROVIDER_SITE_OTHER): Payer: Medicare Other | Admitting: Nurse Practitioner

## 2018-10-25 VITALS — BP 144/86 | HR 48 | Temp 96.9°F | Ht 62.0 in | Wt 208.0 lb

## 2018-10-25 DIAGNOSIS — L989 Disorder of the skin and subcutaneous tissue, unspecified: Secondary | ICD-10-CM

## 2018-10-25 DIAGNOSIS — I4821 Permanent atrial fibrillation: Secondary | ICD-10-CM | POA: Diagnosis not present

## 2018-10-25 LAB — COAGUCHEK XS/INR WAIVED
INR: 1.9 — ABNORMAL HIGH (ref 0.9–1.1)
Prothrombin Time: 23.2 s

## 2018-10-25 NOTE — Progress Notes (Signed)
Subjective:   chief complaint: INR recheck   Indication: atrial fibrillation Bleeding signs/symptoms: None Thromboembolic signs/symptoms: None  Missed Coumadin doses: None Medication changes: no Dietary changes: no Bacterial/viral infection: no Other concerns: no  The following portions of the patient's history were reviewed and updated as appropriate: allergies, current medications, past family history, past medical history, past social history, past surgical history and problem list.  Review of Systems Pertinent items noted in HPI and remainder of comprehensive ROS otherwise negative.   Objective:    INR Today: 1.9 Current dose: coumadin 3.75mg  1 po daily    Assessment:    Subtherapeutic INR for goal of 2-3   Plan:    1. New dose: no change   2. Next INR: 1 month   Watch greens in diet  Mary-Margaret Hassell Done, FNP

## 2018-11-13 ENCOUNTER — Other Ambulatory Visit: Payer: Self-pay | Admitting: Nurse Practitioner

## 2018-11-13 DIAGNOSIS — E785 Hyperlipidemia, unspecified: Secondary | ICD-10-CM

## 2018-11-19 ENCOUNTER — Ambulatory Visit: Payer: Medicare Other | Admitting: *Deleted

## 2018-11-19 ENCOUNTER — Encounter: Payer: Self-pay | Admitting: *Deleted

## 2018-11-19 VITALS — BP 151/79 | HR 62 | Ht 62.0 in | Wt 209.0 lb

## 2018-11-19 DIAGNOSIS — Z0000 Encounter for general adult medical examination without abnormal findings: Secondary | ICD-10-CM | POA: Diagnosis not present

## 2018-11-19 DIAGNOSIS — Z23 Encounter for immunization: Secondary | ICD-10-CM

## 2018-11-19 NOTE — Progress Notes (Addendum)
Subjective:   Melissa Knight is a 85 y.o. female who presents for a subsequent Medicare Annual Wellness Visit.  Melissa Knight is accompanied today by her husband Melissa Knight.  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  She enjoys doing crossword puzzles, going out to eat at restaurants, and spending time with her family.  She is active in her church.  She lives at home with her husband of 18 years.  Melissa Knight has 3 sons, 9 grandchildren, and 6 great grandchildren.     Patient Care Team: Chevis Pretty, FNP as PCP - General (Nurse Practitioner) Sherren Mocha, MD as Consulting Physician (Cardiology) Sharmon Revere as Physician Assistant (Physician Assistant) Melina Schools, OD as Consulting Physician (Optometry)  Hospitalizations, surgeries, and ER visits in previous 12 months No hospitalizations, ER visits, or surgeries this past year.   Review of Systems    Patient reports that her overall health is unchanged compared to last year.  Cardiac Risk Factors include: advanced age (>61men, >51 women);dyslipidemia;hypertension;obesity (BMI >30kg/m2);sedentary lifestyle  All other systems negative       Current Medications (verified) Outpatient Encounter Medications as of 11/19/2018  Medication Sig  . amLODipine (NORVASC) 2.5 MG tablet Take 1 tablet (2.5 mg total) by mouth daily.  Marland Kitchen atorvastatin (LIPITOR) 10 MG tablet TAKE 1 TABLET (10 MG TOTAL) BY MOUTH DAILY AT 6 PM.  . betamethasone dipropionate (DIPROLENE) 0.05 % cream APPLY TO AFFECTED AREAS AS NEEDED  . calcium-vitamin D (OSCAL WITH D) 500-200 MG-UNIT tablet Take 1 tablet by mouth.  . furosemide (LASIX) 20 MG tablet TAKE 1 TABLET EVERY DAY AS NEEDED  . levETIRAcetam (KEPPRA) 500 MG tablet Take 1 tablet (500 mg total) by mouth daily.  Marland Kitchen levothyroxine (SYNTHROID, LEVOTHROID) 88 MCG tablet TAKE 1 TABLET (88 MCG TOTAL) BY MOUTH DAILY.  Marland Kitchen lisinopril (PRINIVIL,ZESTRIL) 20 MG tablet TAKE 1 TABLET (20 MG TOTAL) BY  MOUTH 2 (TWO) TIMES DAILY.  . metoprolol tartrate (LOPRESSOR) 50 MG tablet TAKE 2 TABLETS IN MORNING AND 1 AT BEDTIME  . warfarin (COUMADIN) 7.5 MG tablet Take 1 tablet (7.5 mg total) by mouth one time only at 6 PM.   No facility-administered encounter medications on file as of 11/19/2018.     Allergies (verified) Sulfa antibiotics   History: Past Medical History:  Diagnosis Date  . Acute ischemic stroke ([place]) 06/2012   acute right parietal ischemic stroke - likely cardioembolic in setting of AFib => coumadin started and followed by PCP  . Atrial fibrillation ([place]) 06/2012   Echocardiogram 06/30/12: EF 60-65%, mild MR, PASP 33.  Marland Kitchen Hyperlipidemia   . Hypertension   . Hypothyroidism   . Lichen sclerosus et atrophicus of the vulva   . Seizure ([place]) 06/2012   in the setting of acute ischemic stroke   Past Surgical History:  Procedure Laterality Date  . BLADDER SURGERY     bladder prolapse  . TUBAL LIGATION     Family History  Problem Relation Age of Onset  . Atrial fibrillation Mother        pacemaker  . Hypertension Mother   . Heart disease Sister        MI in 10s  . Stroke Sister   . Heart disease Father   . Heart disease Sister   . Heart disease Sister   . Hypertension Sister   . Anxiety disorder Sister   . Heart disease Brother   . Cancer Brother        pancreatic  .  Heart disease Son   . Stroke Son 31   Social History   Socioeconomic History  . Marital status: Married    Spouse name: Not on file  . Number of children: 3  . Years of education: 34  . Highest education level: Some college, no degree  Occupational History  . Occupation: retired    Comment: Therapist, art  Social Needs  . Financial resource strain: Not hard at all  . Food insecurity:    Worry: Never true    Inability: Never true  . Transportation needs:    Medical: No    Non-medical: No  Tobacco Use  . Smoking status: Former Smoker    Packs/day: 1.00    Types: Cigarettes    Last  attempt to quit: 09/30/1978    Years since quitting: 40.1  . Smokeless tobacco: Never Used  Substance and Sexual Activity  . Alcohol use: No  . Drug use: No  . Sexual activity: Not on file  Lifestyle  . Physical activity:    Days per week: 0 days    Minutes per session: 0 min  . Stress: Only a little  Relationships  . Social connections:    Talks on phone: More than three times a week    Gets together: More than three times a week    Attends religious service: More than 4 times per year    Active member of club or organization: No    Attends meetings of clubs or organizations: Never    Relationship status: Married  Other Topics Concern  . Not on file  Social History Narrative  . Not on file     Clinical Intake:     Pain Score: 0-No pain                  Activities of Daily Living In your present state of health, do you have any difficulty performing the following activities: 11/19/2018  Hearing? N  Vision? N  Difficulty concentrating or making decisions? N  Walking or climbing stairs? Y  Comment due to knee pain  Dressing or bathing? N  Doing errands, shopping? N  Preparing Food and eating ? N  Using the Toilet? N  In the past six months, have you accidently leaked urine? Y  Comment with coughing or sneezing  Do you have problems with loss of bowel control? N  Managing your Medications? N  Managing your Finances? N  Housekeeping or managing your Housekeeping? N  Some recent data might be hidden     Exercise Current Exercise Habits: The patient does not participate in regular exercise at present, Exercise limited by: orthopedic condition(s)  Diet Consumes 2 meals a day and 1 snacks a day.  The patient feels that she mostly follow a Regular diet.  Diet History  Melissa Knight states she usually eats 2 meals per day and one snack.  Recommended diet of mostly non-starch vegetables, fruits, whole grains and lean proteins.  She states she has access to  all the food she needs.    Depression Screen PHQ 2/9 Scores 11/19/2018 10/25/2018 09/24/2018 07/03/2018 04/25/2018 02/27/2018 12/25/2017  PHQ - 2 Score 0 0 0 0 0 0 0     Fall Risk Fall Risk  11/19/2018 10/25/2018 09/24/2018 07/03/2018 04/25/2018  Falls in the past year? 0 0 0 No No  Number falls in past yr: - - - - -  Injury with Fall? - - - - -     Objective:  Today's Vitals   11/19/18 1603  BP: (!) 151/79  Pulse: 62  Weight: 209 lb (94.8 kg)  Height: 5\' 2"  (1.575 m)  PainSc: 0-No pain   Body mass index is 38.23 kg/m.  Advanced Directives 11/19/2018 11/10/2016 10/12/2015 10/10/2014 09/10/2014 07/01/2012  Does Patient Have a Medical Advance Directive? No No No No No Patient would like information  Would patient like information on creating a medical advance directive? Yes (MAU/Ambulatory/Procedural Areas - Information given) Yes (MAU/Ambulatory/Procedural Areas - Information given) Yes - Educational materials given No - patient declined information Yes - Educational materials given Referral made to social work  Pre-existing out of facility DNR order (yellow form or pink MOST form) - - - - - No    Hearing/Vision  No hearing or vision deficits noted during visit. Patient wears glasses, states she is seen yearly at [facility].   Cognitive Function: MMSE - Mini Mental State Exam 11/19/2018 11/19/2018 11/14/2017 11/10/2016 10/12/2015  Orientation to time 5 5 5 5 5   Orientation to Place 4 - 5 5 5   Registration 3 - 3 3 3   Attention/ Calculation 5 - 5 5 5   Recall 3 - 3 3 3   Language- name 2 objects 2 - 2 2 2   Language- repeat 1 - 1 1 1   Language- follow 3 step command 3 - 3 3 3   Language- read & follow direction 1 - 1 1 1   Write a sentence 1 - 1 1 1   Copy design 1 - 1 1 1   Total score 29 - 30 30 30            Immunizations and Health Maintenance Immunization History  Administered Date(s) Administered  . Influenza Split 07/03/2012  . Influenza, High Dose Seasonal PF 08/08/2017,  07/03/2018  . Influenza,inj,Quad PF,6+ Mos 09/19/2013, 07/10/2014, 06/25/2015, 07/07/2016  . Pneumococcal Conjugate-13 07/10/2014  . Pneumococcal Polysaccharide-23 12/28/2010  . Zoster 01/08/2015  . Zoster Recombinat (Shingrix) 11/19/2018   Health Maintenance Due  Topic Date Due  . Samul Dada  10/16/1951   Health Maintenance  Topic Date Due  . Samul Dada  10/16/1951  . DEXA SCAN  03/01/2023  . INFLUENZA VACCINE  Completed  . PNA vac Low Risk Adult  Completed  . MAMMOGRAM  Discontinued   Tdap declined today      Assessment:   This is a routine wellness examination for Melissa Knight.    Plan:    Goals         . Exercise 3x per week (30 min per time)     Walking is a great option.         Health Maintenance & Additional Screening Recommendations  Advanced directives: has NO advanced directive  - add't info requested. Referral to SW: no  Lung: Low Dose CT Chest recommended if Age 81-80 years, 30 pack-year currently smoking OR have quit w/in 15years. Patient does not qualify. Hepatitis C Screening recommended: not applicable   Today's Orders Orders Placed This Encounter  Procedures  . Varicella-zoster vaccine IM (Shingrix)    Keep f/u with Chevis Pretty, FNP and any other specialty appointments you may have Continue current medications Move carefully to avoid falls. Use assistive devices like a cane or walker if needed. Aim for at least 150 minutes of moderate activity a week. This can be done with chair exercises if necessary. Read or work on puzzles daily Stay connected with friends and family  I have personally reviewed and noted the following in the patient's chart:   .  Medical and social history . Use of alcohol, tobacco or illicit drugs  . Current medications and supplements . Functional ability and status . Nutritional status . Physical activity . Advanced directives . List of other physicians . Hospitalizations, surgeries, and ER visits in  previous 12 months . Vitals . Screenings to include cognitive, depression, and falls . Referrals and appointments  In addition, I have reviewed and discussed with patient certain preventive protocols, quality metrics, and best practice recommendations. A written personalized care plan for preventive services as well as general preventive health recommendations were provided to patient.     Nolberto Hanlon, RN  11/19/2018  I have reviewed and agree with the above AWV documentation.   Mary-Margaret Hassell Done, FNP

## 2018-11-19 NOTE — Patient Instructions (Signed)
Please work on your goal of exercising 3 times per week for 30 minutes each session.  Walking is a great option.   Please review the information given on Advance Directives.  If you complete the paperwork, please bring a copy to our office to be filed in your medical record.   Continue to move carefully to avoid falls.  Please follow up with Chevis Pretty, FNP as scheduled.   Thank you for coming in for your Annual Wellness Visit today!   Preventive Care 83 Years and Older, Female Preventive care refers to lifestyle choices and visits with your health care provider that can promote health and wellness. What does preventive care include?  A yearly physical exam. This is also called an annual well check.  Dental exams once or twice a year.  Routine eye exams. Ask your health care provider how often you should have your eyes checked.  Personal lifestyle choices, including: ? Daily care of your teeth and gums. ? Regular physical activity. ? Eating a healthy diet. ? Avoiding tobacco and drug use. ? Limiting alcohol use. ? Practicing safe sex. ? Taking low-dose aspirin every day. ? Taking vitamin and mineral supplements as recommended by your health care provider. What happens during an annual well check? The services and screenings done by your health care provider during your annual well check will depend on your age, overall health, lifestyle risk factors, and family history of disease. Counseling Your health care provider may ask you questions about your:  Alcohol use.  Tobacco use.  Drug use.  Emotional well-being.  Home and relationship well-being.  Sexual activity.  Eating habits.  History of falls.  Memory and ability to understand (cognition).  Work and work Statistician.  Reproductive health.  Screening You may have the following tests or measurements:  Height, weight, and BMI.  Blood pressure.  Lipid and cholesterol levels. These may be  checked every 5 years, or more frequently if you are over 50 years old.  Skin check.  Lung cancer screening. You may have this screening every year starting at age 80 if you have a 30-pack-year history of smoking and currently smoke or have quit within the past 15 years.  Colorectal cancer screening. All adults should have this screening starting at age 46 and continuing until age 51. You will have tests every 1-10 years, depending on your results and the type of screening test. People at increased risk should start screening at an earlier age. Screening tests may include: ? Guaiac-based fecal occult blood testing. ? Fecal immunochemical test (FIT). ? Stool DNA test. ? Virtual colonoscopy. ? Sigmoidoscopy. During this test, a flexible tube with a tiny camera (sigmoidoscope) is used to examine your rectum and lower colon. The sigmoidoscope is inserted through your anus into your rectum and lower colon. ? Colonoscopy. During this test, a long, thin, flexible tube with a tiny camera (colonoscope) is used to examine your entire colon and rectum.  Hepatitis C blood test.  Hepatitis B blood test.  Sexually transmitted disease (STD) testing.  Diabetes screening. This is done by checking your blood sugar (glucose) after you have not eaten for a while (fasting). You may have this done every 1-3 years.  Bone density scan. This is done to screen for osteoporosis. You may have this done starting at age 73.  Mammogram. This may be done every 1-2 years. Talk to your health care provider about how often you should have regular mammograms. Talk with your health care  provider about your test results, treatment options, and if necessary, the need for more tests. Vaccines Your health care provider may recommend certain vaccines, such as:  Influenza vaccine. This is recommended every year.  Tetanus, diphtheria, and acellular pertussis (Tdap, Td) vaccine. You may need a Td booster every 10  years.  Varicella vaccine. You may need this if you have not been vaccinated.  Zoster vaccine. You may need this after age 23.  Measles, mumps, and rubella (MMR) vaccine. You may need at least one dose of MMR if you were born in 1957 or later. You may also need a second dose.  Pneumococcal 13-valent conjugate (PCV13) vaccine. One dose is recommended after age 69.  Pneumococcal polysaccharide (PPSV23) vaccine. One dose is recommended after age 60.  Meningococcal vaccine. You may need this if you have certain conditions.  Hepatitis A vaccine. You may need this if you have certain conditions or if you travel or work in places where you may be exposed to hepatitis A.  Hepatitis B vaccine. You may need this if you have certain conditions or if you travel or work in places where you may be exposed to hepatitis B.  Haemophilus influenzae type b (Hib) vaccine. You may need this if you have certain conditions. Talk to your health care provider about which screenings and vaccines you need and how often you need them. This information is not intended to replace advice given to you by your health care provider. Make sure you discuss any questions you have with your health care provider. Document Released: 09/25/2015 Document Revised: 10/19/2017 Document Reviewed: 06/30/2015 Elsevier Interactive Patient Education  2019 [employer].

## 2018-11-22 ENCOUNTER — Other Ambulatory Visit: Payer: Self-pay

## 2018-11-22 ENCOUNTER — Encounter: Payer: Self-pay | Admitting: Nurse Practitioner

## 2018-11-22 ENCOUNTER — Ambulatory Visit (INDEPENDENT_AMBULATORY_CARE_PROVIDER_SITE_OTHER): Payer: Medicare Other | Admitting: Nurse Practitioner

## 2018-11-22 VITALS — BP 137/84 | HR 57 | Temp 97.1°F | Ht 62.0 in | Wt 211.0 lb

## 2018-11-22 DIAGNOSIS — I4821 Permanent atrial fibrillation: Secondary | ICD-10-CM | POA: Diagnosis not present

## 2018-11-22 LAB — COAGUCHEK XS/INR WAIVED
INR: 2.3 — ABNORMAL HIGH (ref 0.9–1.1)
Prothrombin Time: 28 s

## 2018-11-22 NOTE — Progress Notes (Signed)
Subjective:   Chief Complaint:  INR recheck   Indication: atrial fibrillation Bleeding signs/symptoms: None Thromboembolic signs/symptoms: None  Missed Coumadin doses: None Medication changes: no Dietary changes: no Bacterial/viral infection: no Other concerns: no  The following portions of the patient's history were reviewed and updated as appropriate: allergies, current medications, past family history, past medical history, past social history, past surgical history and problem list.  Review of Systems Pertinent items noted in HPI and remainder of comprehensive ROS otherwise negative.   Objective:    INR Today: 2.3 Current dose: coumadin 3.75mg  daily    Assessment:    Therapeutic INR for goal of 2-3   Plan:    1. New dose: no change   2. Next INR: 1 month    Mary-Margaret Hassell Done, FNP

## 2018-12-27 ENCOUNTER — Other Ambulatory Visit: Payer: Self-pay

## 2018-12-27 ENCOUNTER — Ambulatory Visit (INDEPENDENT_AMBULATORY_CARE_PROVIDER_SITE_OTHER): Payer: Medicare Other | Admitting: Nurse Practitioner

## 2018-12-27 ENCOUNTER — Encounter: Payer: Self-pay | Admitting: Nurse Practitioner

## 2018-12-27 VITALS — BP 157/89 | HR 53 | Temp 98.4°F | Ht 62.0 in | Wt 208.0 lb

## 2018-12-27 DIAGNOSIS — I4821 Permanent atrial fibrillation: Secondary | ICD-10-CM

## 2018-12-27 LAB — COAGUCHEK XS/INR WAIVED
INR: 3.4 — ABNORMAL HIGH (ref 0.9–1.1)
Prothrombin Time: 40.7 s

## 2018-12-27 NOTE — Progress Notes (Signed)
Subjective:   Chief complaint- INR recheck   Indication: atrial fibrillation Bleeding signs/symptoms: None Thromboembolic signs/symptoms: None  Missed Coumadin doses: None Medication changes: no Dietary changes: yes has not been eating any greens Bacterial/viral infection: no Other concerns: no  The following portions of the patient's history were reviewed and updated as appropriate: allergies, current medications, past family history, past medical history, past social history, past surgical history and problem list.  Review of Systems Pertinent items noted in HPI and remainder of comprehensive ROS otherwise negative.   Objective:    INR Today: 3.4 Current dose: coumadin 3.75mg  daily    Assessment:    Supratherapeutic INR for goal of 2-3   Plan:    1. New dose: hold todays dose then back to 3.75mg  daily   2. Next INR: 1 month    Mary-Margaret Hassell Done, FNP

## 2019-01-28 ENCOUNTER — Other Ambulatory Visit: Payer: Self-pay | Admitting: Nurse Practitioner

## 2019-01-28 DIAGNOSIS — I4821 Permanent atrial fibrillation: Secondary | ICD-10-CM

## 2019-01-29 ENCOUNTER — Other Ambulatory Visit: Payer: Self-pay

## 2019-01-30 ENCOUNTER — Encounter: Payer: Self-pay | Admitting: Nurse Practitioner

## 2019-01-30 ENCOUNTER — Other Ambulatory Visit: Payer: Self-pay

## 2019-01-30 ENCOUNTER — Ambulatory Visit (INDEPENDENT_AMBULATORY_CARE_PROVIDER_SITE_OTHER): Payer: Medicare Other | Admitting: Nurse Practitioner

## 2019-01-30 VITALS — BP 143/87 | HR 56 | Temp 97.2°F | Ht 62.0 in | Wt 206.0 lb

## 2019-01-30 DIAGNOSIS — I6930 Unspecified sequelae of cerebral infarction: Secondary | ICD-10-CM | POA: Diagnosis not present

## 2019-01-30 DIAGNOSIS — N904 Leukoplakia of vulva: Secondary | ICD-10-CM | POA: Diagnosis not present

## 2019-01-30 DIAGNOSIS — E039 Hypothyroidism, unspecified: Secondary | ICD-10-CM

## 2019-01-30 DIAGNOSIS — E6609 Other obesity due to excess calories: Secondary | ICD-10-CM

## 2019-01-30 DIAGNOSIS — Z6835 Body mass index (BMI) 35.0-35.9, adult: Secondary | ICD-10-CM | POA: Diagnosis not present

## 2019-01-30 DIAGNOSIS — E8881 Metabolic syndrome: Secondary | ICD-10-CM | POA: Diagnosis not present

## 2019-01-30 DIAGNOSIS — I4821 Permanent atrial fibrillation: Secondary | ICD-10-CM | POA: Diagnosis not present

## 2019-01-30 DIAGNOSIS — E785 Hyperlipidemia, unspecified: Secondary | ICD-10-CM

## 2019-01-30 DIAGNOSIS — I10 Essential (primary) hypertension: Secondary | ICD-10-CM | POA: Diagnosis not present

## 2019-01-30 DIAGNOSIS — G40319 Generalized idiopathic epilepsy and epileptic syndromes, intractable, without status epilepticus: Secondary | ICD-10-CM | POA: Diagnosis not present

## 2019-01-30 DIAGNOSIS — N393 Stress incontinence (female) (male): Secondary | ICD-10-CM | POA: Diagnosis not present

## 2019-01-30 LAB — COAGUCHEK XS/INR WAIVED
INR: 3 — ABNORMAL HIGH (ref 0.9–1.1)
Prothrombin Time: 35.6 s

## 2019-01-30 MED ORDER — METOPROLOL TARTRATE 50 MG PO TABS: ORAL_TABLET | ORAL | 1 refills | Status: DC

## 2019-01-30 MED ORDER — AMLODIPINE BESYLATE 2.5 MG PO TABS: 2.5000 mg | ORAL_TABLET | Freq: Every day | ORAL | 1 refills | Status: DC

## 2019-01-30 MED ORDER — LEVETIRACETAM 500 MG PO TABS: 500.0000 mg | ORAL_TABLET | Freq: Every day | ORAL | 1 refills | Status: DC

## 2019-01-30 MED ORDER — LISINOPRIL 20 MG PO TABS: ORAL_TABLET | ORAL | 1 refills | Status: DC

## 2019-01-30 MED ORDER — LEVOTHYROXINE SODIUM 88 MCG PO TABS: ORAL_TABLET | ORAL | 2 refills | Status: DC

## 2019-01-30 MED ORDER — FUROSEMIDE 20 MG PO TABS: ORAL_TABLET | ORAL | 1 refills | Status: DC

## 2019-01-30 MED ORDER — WARFARIN SODIUM 7.5 MG PO TABS: 7.5000 mg | ORAL_TABLET | Freq: Once | ORAL | 0 refills | Status: DC

## 2019-01-30 MED ORDER — ATORVASTATIN CALCIUM 10 MG PO TABS: 10.0000 mg | ORAL_TABLET | Freq: Every day | ORAL | Status: DC

## 2019-01-30 NOTE — Patient Instructions (Signed)
Diabetes Mellitus and Foot Care  Foot care is an important part of your health, especially when you have diabetes. Diabetes may cause you to have problems because of poor blood flow (circulation) to your feet and legs, which can cause your skin to:   Become thinner and drier.   Break more easily.   Heal more slowly.   Peel and crack.  You may also have nerve damage (neuropathy) in your legs and feet, causing decreased feeling in them. This means that you may not notice minor injuries to your feet that could lead to more serious problems. Noticing and addressing any potential problems early is the best way to prevent future foot problems.  How to care for your feet  Foot hygiene   Wash your feet daily with warm water and mild soap. Do not use hot water. Then, pat your feet and the areas between your toes until they are completely dry. Do not soak your feet as this can dry your skin.   Trim your toenails straight across. Do not dig under them or around the cuticle. File the edges of your nails with an emery board or nail file.   Apply a moisturizing lotion or petroleum jelly to the skin on your feet and to dry, brittle toenails. Use lotion that does not contain alcohol and is unscented. Do not apply lotion between your toes.  Shoes and socks   Wear clean socks or stockings every day. Make sure they are not too tight. Do not wear knee-high stockings since they may decrease blood flow to your legs.   Wear shoes that fit properly and have enough cushioning. Always look in your shoes before you put them on to be sure there are no objects inside.   To break in new shoes, wear them for just a few hours a day. This prevents injuries on your feet.  Wounds, scrapes, corns, and calluses   Check your feet daily for blisters, cuts, bruises, sores, and redness. If you cannot see the bottom of your feet, use a mirror or ask someone for help.   Do not cut corns or calluses or try to remove them with medicine.   If you  find a minor scrape, cut, or break in the skin on your feet, keep it and the skin around it clean and dry. You may clean these areas with mild soap and water. Do not clean the area with peroxide, alcohol, or iodine.   If you have a wound, scrape, corn, or callus on your foot, look at it several times a day to make sure it is healing and not infected. Check for:  ? Redness, swelling, or pain.  ? Fluid or blood.  ? Warmth.  ? Pus or a bad smell.  General instructions   Do not cross your legs. This may decrease blood flow to your feet.   Do not use heating pads or hot water bottles on your feet. They may burn your skin. If you have lost feeling in your feet or legs, you may not know this is happening until it is too late.   Protect your feet from hot and cold by wearing shoes, such as at the beach or on hot pavement.   Schedule a complete foot exam at least once a year (annually) or more often if you have foot problems. If you have foot problems, report any cuts, sores, or bruises to your health care provider immediately.  Contact a health care provider if:     You have a medical condition that increases your risk of infection and you have any cuts, sores, or bruises on your feet.   You have an injury that is not healing.   You have redness on your legs or feet.   You feel burning or tingling in your legs or feet.   You have pain or cramps in your legs and feet.   Your legs or feet are numb.   Your feet always feel cold.   You have pain around a toenail.  Get help right away if:   You have a wound, scrape, corn, or callus on your foot and:  ? You have pain, swelling, or redness that gets worse.  ? You have fluid or blood coming from the wound, scrape, corn, or callus.  ? Your wound, scrape, corn, or callus feels warm to the touch.  ? You have pus or a bad smell coming from the wound, scrape, corn, or callus.  ? You have a fever.  ? You have a red line going up your leg.  Summary   Check your feet every day  for cuts, sores, red spots, swelling, and blisters.   Moisturize feet and legs daily.   Wear shoes that fit properly and have enough cushioning.   If you have foot problems, report any cuts, sores, or bruises to your health care provider immediately.   Schedule a complete foot exam at least once a year (annually) or more often if you have foot problems.  This information is not intended to replace advice given to you by your health care provider. Make sure you discuss any questions you have with your health care provider.  Document Released: 08/26/2000 Document Revised: 10/11/2017 Document Reviewed: 09/30/2016  Elsevier Interactive Patient Education  2019 [employer]

## 2019-01-30 NOTE — Progress Notes (Signed)
Subjective:    Patient ID: Melissa Knight, female    DOB: Apr 28, 1933, 85 y.o.   MRN: [id number]   Chief Complaint: Medical Management of Chronic Issues    HPI:  1. Essential hypertension No c/o chest pain, sob or headaches. Does not check blood pressure at home very often. BP Readings from Last 3 Encounters:  01/30/19 (!) 143/87  12/27/18 (!) 157/89  11/22/18 137/84     2. Hyperlipidemia, unspecified hyperlipidemia type Not watch ing diet and no dedicated exercise  3. Late effect of cerebrovascular accident No lingering effects that she can tell  4. Permanent atrial fibrillation (HCC) Has good rate control. No palpitations. Is on coumadin and will check INR today.   Indication: atrial fibrillation Bleeding signs/symptoms: None Thromboembolic signs/symptoms: None  Missed Coumadin doses: None Medication changes: no Dietary changes: no Bacterial/viral infection: no Other concerns: no    5. Hypothyroidism (acquired) No problems that she is aware of.  6. Lichen sclerosus of female genitalia Has not been bothering her lately. Diprolene cream helps.  7. Metabolic syndrome Does not check blood sugars at home. And does not watch diet.  8. Stress incontinence No problems  9. Class 2 obesity due to excess calories with body mass index (BMI) of 35.0 to 35.9 in adult, unspecified whether serious comorbidity present No recent weight changes    Outpatient Encounter Medications as of 01/30/2019  Medication Sig  . amLODipine (NORVASC) 2.5 MG tablet Take 1 tablet (2.5 mg total) by mouth daily.  Marland Kitchen atorvastatin (LIPITOR) 10 MG tablet TAKE 1 TABLET (10 MG TOTAL) BY MOUTH DAILY AT 6 PM.  . betamethasone dipropionate (DIPROLENE) 0.05 % cream APPLY TO AFFECTED AREAS AS NEEDED  . calcium-vitamin D (OSCAL WITH D) 500-200 MG-UNIT tablet Take 1 tablet by mouth.  . furosemide (LASIX) 20 MG tablet TAKE 1 TABLET EVERY DAY AS NEEDED  . levETIRAcetam (KEPPRA) 500 MG tablet Take 1 tablet  (500 mg total) by mouth daily.  Marland Kitchen levothyroxine (SYNTHROID, LEVOTHROID) 88 MCG tablet TAKE 1 TABLET (88 MCG TOTAL) BY MOUTH DAILY.  Marland Kitchen lisinopril (PRINIVIL,ZESTRIL) 20 MG tablet TAKE 1 TABLET (20 MG TOTAL) BY MOUTH 2 (TWO) TIMES DAILY.  . metoprolol tartrate (LOPRESSOR) 50 MG tablet TAKE 2 TABLETS IN MORNING AND 1 AT BEDTIME  . warfarin (COUMADIN) 7.5 MG tablet TAKE 1 TABLET (7.5 MG TOTAL) BY MOUTH ONE TIME ONLY AT 6 PM.     New complaints: None  today  Social history: Lives with her husband - she is doing well right now.    Review of Systems  Constitutional: Negative for activity change and appetite change.  HENT: Negative.   Eyes: Negative for pain.  Respiratory: Negative for shortness of breath.   Cardiovascular: Negative for chest pain, palpitations and leg swelling.  Gastrointestinal: Negative for abdominal pain.  Endocrine: Negative for polydipsia.  Genitourinary: Negative.   Skin: Negative for rash.  Neurological: Negative for dizziness, weakness and headaches.  Hematological: Does not bruise/bleed easily.  Psychiatric/Behavioral: Negative.   All other systems reviewed and are negative.      Objective:   Physical Exam Vitals signs and nursing note reviewed.  Constitutional:      General: She is not in acute distress.    Appearance: Normal appearance. She is well-developed.  HENT:     Head: Normocephalic.     Nose: Nose normal.  Eyes:     Pupils: Pupils are equal, round, and reactive to light.  Neck:     Musculoskeletal: Normal range  of motion and neck supple.     Vascular: No carotid bruit or JVD.  Cardiovascular:     Rate and Rhythm: Normal rate and regular rhythm.     Heart sounds: Normal heart sounds.  Pulmonary:     Effort: Pulmonary effort is normal. No respiratory distress.     Breath sounds: Normal breath sounds. No wheezing or rales.  Chest:     Chest wall: No tenderness.  Abdominal:     General: Bowel sounds are normal. There is no distension or  abdominal bruit.     Palpations: Abdomen is soft. There is no hepatomegaly, splenomegaly, mass or pulsatile mass.     Tenderness: There is no abdominal tenderness.  Musculoskeletal: Normal range of motion.  Lymphadenopathy:     Cervical: No cervical adenopathy.  Skin:    General: Skin is warm and dry.  Neurological:     Mental Status: She is alert and oriented to person, place, and time.     Deep Tendon Reflexes: Reflexes are normal and symmetric.  Psychiatric:        Behavior: Behavior normal.        Thought Content: Thought content normal.        Judgment: Judgment normal.    BP (!) 143/87   Pulse (!) 56   Temp (!) 97.2 F (36.2 C) (Oral)   Ht 5' 2"  (1.575 m)   Wt 206 lb (93.4 kg)   BMI 37.68 kg/m      Assessment & Plan:  Melissa Knight comes in today with chief complaint of Medical Management of Chronic Issues   Diagnosis and orders addressed:  1. Essential hypertension Low sodium diet - CMP14+EGFR - amLODipine (NORVASC) 2.5 MG tablet; Take 1 tablet (2.5 mg total) by mouth daily.  Dispense: 90 tablet; Refill: 1 - lisinopril (ZESTRIL) 20 MG tablet; TAKE 1 TABLET (20 MG TOTAL) BY MOUTH 2 (TWO) TIMES DAILY.  Dispense: 180 tablet; Refill: 1 - furosemide (LASIX) 20 MG tablet; TAKE 1 TABLET EVERY DAY AS NEEDED  Dispense: 90 tablet; Refill: 1  2. Hyperlipidemia, unspecified hyperlipidemia type Low fat diet - Lipid panel - atorvastatin (LIPITOR) 10 MG tablet; Take 1 tablet (10 mg total) by mouth daily at 6 PM.  Dispense: 90 tablet; Refill: 01  3. Late effect of cerebrovascular accident Fall precautions  4. Permanent atrial fibrillation (HCC) Continue current dose of coumadin- recheck INR in 4 weeks - CoaguChek XS/INR Waived - warfarin (COUMADIN) 7.5 MG tablet; Take 1 tablet (7.5 mg total) by mouth one time only at 6 PM.  Dispense: 90 tablet; Refill: 0 - metoprolol tartrate (LOPRESSOR) 50 MG tablet; TAKE 2 TABLETS IN MORNING AND 1 AT BEDTIME  Dispense: 270 tablet; Refill:  1  5. Hypothyroidism (acquired) - levothyroxine (SYNTHROID) 88 MCG tablet; TAKE 1 TABLET (88 MCG TOTAL) BY MOUTH DAILY.  Dispense: 90 tablet; Refill: 2  6. Lichen sclerosus of female genitalia Continue diprolene  7. Metabolic syndrome Watch carbs in diet  8. Stress incontinence  9. Class 2 obesity due to excess calories with body mass index (BMI) of 35.0 to 35.9 in adult, unspecified whether serious comorbidity present Discussed diet and exercise for person with BMI >25 Will recheck weight in 3-6 months  10. Permanent atrial fibrillation - CoaguChek XS/INR Waived - warfarin (COUMADIN) 7.5 MG tablet; Take 1 tablet (7.5 mg total) by mouth one time only at 6 PM.  Dispense: 90 tablet; Refill: 0 - metoprolol tartrate (LOPRESSOR) 50 MG tablet; TAKE 2 TABLETS IN MORNING  AND 1 AT BEDTIME  Dispense: 270 tablet; Refill: 1  11. Seizure disorder, generalized convulsive, intractable (HCC) - levETIRAcetam (KEPPRA) 500 MG tablet; Take 1 tablet (500 mg total) by mouth daily.  Dispense: 90 tablet; Refill: 1   Labs pending Health Maintenance reviewed Diet and exercise encouraged  Follow up plan: 1 months INR 3 months follow up   [place], FNP

## 2019-01-31 LAB — CMP14+EGFR
ALT: 17 IU/L (ref 0–32)
AST: 23 IU/L (ref 0–40)
Albumin/Globulin Ratio: 1.7 (ref 1.2–2.2)
Albumin: 4 g/dL (ref 3.6–4.6)
Alkaline Phosphatase: 48 IU/L (ref 39–117)
BUN/Creatinine Ratio: 16 (ref 12–28)
BUN: 14 mg/dL (ref 8–27)
Bilirubin Total: 0.6 mg/dL (ref 0.0–1.2)
CO2: 21 mmol/L (ref 20–29)
Calcium: 9.1 mg/dL (ref 8.7–10.3)
Chloride: 105 mmol/L (ref 96–106)
Creatinine, Ser: 0.89 mg/dL (ref 0.57–1.00)
GFR calc Af Amer: 69 mL/min/{1.73_m2} (ref >59)
GFR calc non Af Amer: 60 mL/min/{1.73_m2} (ref >59)
Globulin, Total: 2.3 g/dL (ref 1.5–4.5)
Glucose: 96 mg/dL (ref 65–99)
Potassium: 4.4 mmol/L (ref 3.5–5.2)
Sodium: 139 mmol/L (ref 134–144)
Total Protein: 6.3 g/dL (ref 6.0–8.5)

## 2019-01-31 LAB — LIPID PANEL
Chol/HDL Ratio: 3.7 ratio (ref 0.0–4.4)
Cholesterol, Total: 187 mg/dL (ref 100–199)
HDL: 51 mg/dL (ref >39)
LDL Calculated: 110 mg/dL — ABNORMAL HIGH (ref 0–99)
Triglycerides: 131 mg/dL (ref 0–149)
VLDL Cholesterol Cal: 26 mg/dL (ref 5–40)

## 2019-02-21 DIAGNOSIS — C44321 Squamous cell carcinoma of skin of nose: Secondary | ICD-10-CM | POA: Diagnosis not present

## 2019-02-21 DIAGNOSIS — D485 Neoplasm of uncertain behavior of skin: Secondary | ICD-10-CM | POA: Diagnosis not present

## 2019-03-07 ENCOUNTER — Other Ambulatory Visit: Payer: Self-pay

## 2019-03-08 ENCOUNTER — Ambulatory Visit (INDEPENDENT_AMBULATORY_CARE_PROVIDER_SITE_OTHER): Payer: Medicare Other | Admitting: Nurse Practitioner

## 2019-03-08 ENCOUNTER — Encounter: Payer: Self-pay | Admitting: Nurse Practitioner

## 2019-03-08 ENCOUNTER — Other Ambulatory Visit: Payer: Self-pay

## 2019-03-08 VITALS — BP 175/92 | HR 63 | Temp 97.2°F | Ht 62.0 in | Wt 206.0 lb

## 2019-03-08 DIAGNOSIS — I4821 Permanent atrial fibrillation: Secondary | ICD-10-CM | POA: Diagnosis not present

## 2019-03-08 LAB — COAGUCHEK XS/INR WAIVED
INR: 2.7 — ABNORMAL HIGH (ref 0.9–1.1)
Prothrombin Time: 32.9 s

## 2019-03-08 NOTE — Progress Notes (Signed)
Subjective:   Chief Complaint- INR recheck  Indication: atrial fibrillation Bleeding signs/symptoms: None Thromboembolic signs/symptoms: None  Missed Coumadin doses: None Medication changes: no Dietary changes: no Bacterial/viral infection: no Other concerns: no  The following portions of the patient's history were reviewed and updated as appropriate: allergies, current medications, past family history, past medical history, past social history, past surgical history and problem list.  Review of Systems Pertinent items noted in HPI and remainder of comprehensive ROS otherwise negative.   Objective:    INR Today: 2.7 Current dose: coumadin 3.75mg  daily    Assessment:    Therapeutic INR for goal of 2-3   Plan:    1. New dose: no change   2. Next INR: 1 month    Mary-Margaret Hassell Done, FNP

## 2019-04-02 ENCOUNTER — Other Ambulatory Visit: Payer: Self-pay | Admitting: Nurse Practitioner

## 2019-04-02 DIAGNOSIS — N904 Leukoplakia of vulva: Secondary | ICD-10-CM

## 2019-04-08 ENCOUNTER — Ambulatory Visit: Payer: Self-pay | Admitting: Nurse Practitioner

## 2019-04-09 DIAGNOSIS — C44321 Squamous cell carcinoma of skin of nose: Secondary | ICD-10-CM | POA: Diagnosis not present

## 2019-04-24 ENCOUNTER — Telehealth: Payer: Self-pay | Admitting: Nurse Practitioner

## 2019-04-24 NOTE — Telephone Encounter (Signed)
Please advise on protime appointment.

## 2019-04-24 NOTE — Telephone Encounter (Signed)
She had a skin cancer removed from her nose. She will have protime done at next office visit. Pt advised she is cancer free and doing well

## 2019-04-24 NOTE — Telephone Encounter (Signed)
What kind of surgery is she having. Are they going to bridge her with lovenox

## 2019-05-10 ENCOUNTER — Encounter: Payer: Self-pay | Admitting: Nurse Practitioner

## 2019-05-10 ENCOUNTER — Other Ambulatory Visit: Payer: Self-pay

## 2019-05-10 ENCOUNTER — Ambulatory Visit (INDEPENDENT_AMBULATORY_CARE_PROVIDER_SITE_OTHER): Payer: Medicare Other | Admitting: Nurse Practitioner

## 2019-05-10 VITALS — BP 142/88 | HR 66 | Temp 96.6°F | Ht 62.0 in | Wt 206.0 lb

## 2019-05-10 DIAGNOSIS — G40319 Generalized idiopathic epilepsy and epileptic syndromes, intractable, without status epilepticus: Secondary | ICD-10-CM

## 2019-05-10 DIAGNOSIS — N393 Stress incontinence (female) (male): Secondary | ICD-10-CM | POA: Diagnosis not present

## 2019-05-10 DIAGNOSIS — I10 Essential (primary) hypertension: Secondary | ICD-10-CM | POA: Diagnosis not present

## 2019-05-10 DIAGNOSIS — E785 Hyperlipidemia, unspecified: Secondary | ICD-10-CM | POA: Diagnosis not present

## 2019-05-10 DIAGNOSIS — E6609 Other obesity due to excess calories: Secondary | ICD-10-CM | POA: Diagnosis not present

## 2019-05-10 DIAGNOSIS — Z6835 Body mass index (BMI) 35.0-35.9, adult: Secondary | ICD-10-CM | POA: Diagnosis not present

## 2019-05-10 DIAGNOSIS — I6930 Unspecified sequelae of cerebral infarction: Secondary | ICD-10-CM | POA: Diagnosis not present

## 2019-05-10 DIAGNOSIS — E039 Hypothyroidism, unspecified: Secondary | ICD-10-CM

## 2019-05-10 DIAGNOSIS — E8881 Metabolic syndrome: Secondary | ICD-10-CM | POA: Diagnosis not present

## 2019-05-10 DIAGNOSIS — I4821 Permanent atrial fibrillation: Secondary | ICD-10-CM

## 2019-05-10 LAB — COAGUCHEK XS/INR WAIVED
INR: 2.3 — ABNORMAL HIGH (ref 0.9–1.1)
Prothrombin Time: 27.8 s

## 2019-05-10 MED ORDER — AMLODIPINE BESYLATE 2.5 MG PO TABS: 2.5000 mg | ORAL_TABLET | Freq: Every day | ORAL | 1 refills | Status: DC

## 2019-05-10 MED ORDER — WARFARIN SODIUM 7.5 MG PO TABS: 7.5000 mg | ORAL_TABLET | Freq: Once | ORAL | 1 refills | Status: DC

## 2019-05-10 MED ORDER — ATORVASTATIN CALCIUM 10 MG PO TABS: 10.0000 mg | ORAL_TABLET | Freq: Every day | ORAL | Status: DC

## 2019-05-10 MED ORDER — METOPROLOL TARTRATE 50 MG PO TABS: ORAL_TABLET | ORAL | 1 refills | Status: DC

## 2019-05-10 MED ORDER — LEVETIRACETAM 500 MG PO TABS: 500.0000 mg | ORAL_TABLET | Freq: Every day | ORAL | 1 refills | Status: DC

## 2019-05-10 MED ORDER — LEVOTHYROXINE SODIUM 88 MCG PO TABS: ORAL_TABLET | ORAL | 2 refills | Status: DC

## 2019-05-10 MED ORDER — FUROSEMIDE 20 MG PO TABS: ORAL_TABLET | ORAL | 1 refills | Status: DC

## 2019-05-10 MED ORDER — LISINOPRIL 20 MG PO TABS: ORAL_TABLET | ORAL | 1 refills | Status: DC

## 2019-05-10 NOTE — Progress Notes (Signed)
Subjective:    Patient ID: Melissa Knight, female    DOB: 1932/11/18, 85 y.o.   MRN: [id number]   Chief Complaint: Medical Management of Chronic Issues    HPI:  1. Essential hypertension No c//o chest pain, sob or headaches. Does not check blood pressure at home. BP Readings from Last 3 Encounters:  03/08/19 (!) 175/92  01/30/19 (!) 143/87  12/27/18 (!) 157/89     2. Permanent atrial fibrillation ([place]) Will recheck inr today Lab Results  Component Value Date   INR 2.7 (H) 03/08/2019   INR 3.0 (H) 01/30/2019   INR 3.4 (H) 12/27/2018    Indication: atrial fibrillation Bleeding signs/symptoms: None Thromboembolic signs/symptoms: None  Missed Coumadin doses: None Medication changes: no Dietary changes: no Bacterial/viral infection: no Other concerns: no    3. Hyperlipidemia, unspecified hyperlipidemia type Tries to watch diet but does little to no exercise. Lab Results  Component Value Date   CHOL 187 01/30/2019   HDL 51 01/30/2019   LDLCALC 110 (H) 01/30/2019   TRIG 131 01/30/2019   CHOLHDL 3.7 01/30/2019     4. Hypothyroidism (acquired) No problems that she is aware of. Lab Results  Component Value Date   TSH 2.540 05/29/2018     5. Seizure disorder, generalized convulsive, intractable ([place]) Has not had seizure in many years but is afraid to stop keppra  6. Late effect of cerebrovascular accident She says she is well and has no residual effects right now.  7. Metabolic syndrome Does not check blood sugars at home. Lab Results  Component Value Date   HGBA1C 5.5 01/01/2014     8. Stress incontinence Is currently not taking any meds for this. Says she doe shave to go frequently, but occasionally cannot make it in time  9. Class 2 obesity due to excess calories with body mass index (BMI) of 35.0 to 35.9 in adult, unspecified whether serious comorbidity present No recent weight changes    Outpatient Encounter Medications as of 05/10/2019    Medication Sig   amLODipine (NORVASC) 2.5 MG tablet Take 1 tablet (2.5 mg total) by mouth daily.   atorvastatin (LIPITOR) 10 MG tablet Take 1 tablet (10 mg total) by mouth daily at 6 PM.   betamethasone dipropionate (DIPROLENE) 0.05 % cream APPLY TO AFFECTED AREAS AS NEEDED   calcium-vitamin D (OSCAL WITH D) 500-200 MG-UNIT tablet Take 1 tablet by mouth.   furosemide (LASIX) 20 MG tablet TAKE 1 TABLET EVERY DAY AS NEEDED   levETIRAcetam (KEPPRA) 500 MG tablet Take 1 tablet (500 mg total) by mouth daily.   levothyroxine (SYNTHROID) 88 MCG tablet TAKE 1 TABLET (88 MCG TOTAL) BY MOUTH DAILY.   lisinopril (ZESTRIL) 20 MG tablet TAKE 1 TABLET (20 MG TOTAL) BY MOUTH 2 (TWO) TIMES DAILY.   metoprolol tartrate (LOPRESSOR) 50 MG tablet TAKE 2 TABLETS IN MORNING AND 1 AT BEDTIME   warfarin (COUMADIN) 7.5 MG tablet Take 1 tablet (7.5 mg total) by mouth one time only at 6 PM.     Past Surgical History:  Procedure Laterality Date   BLADDER SURGERY     bladder prolapse   TUBAL LIGATION      Family History  Problem Relation Age of Onset   Atrial fibrillation Mother        pacemaker   Hypertension Mother    Heart disease Sister        MI in 62s   Stroke Sister    Heart disease Father    Heart  disease Sister    Heart disease Sister    Hypertension Sister    Anxiety disorder Sister    Heart disease Brother    Cancer Brother        pancreatic   Heart disease Son    Stroke Son 51    New complaints: None today  Social history: Lives with her husband  Controlled substance contract: n/a    Review of Systems  Constitutional: Negative for activity change and appetite change.  HENT: Negative.   Eyes: Negative for pain.  Respiratory: Negative for shortness of breath.   Cardiovascular: Negative for chest pain, palpitations and leg swelling.  Gastrointestinal: Negative for abdominal pain.  Endocrine: Negative for polydipsia.  Genitourinary: Negative.   Skin:  Negative for rash.  Neurological: Negative for dizziness, weakness and headaches.  Hematological: Does not bruise/bleed easily.  Psychiatric/Behavioral: Negative.   All other systems reviewed and are negative.      Objective:   Physical Exam Vitals signs and nursing note reviewed.  Constitutional:      General: She is not in acute distress.    Appearance: Normal appearance. She is well-developed.  HENT:     Head: Normocephalic.     Nose: Nose normal.  Eyes:     Pupils: Pupils are equal, round, and reactive to light.  Neck:     Musculoskeletal: Normal range of motion and neck supple.     Vascular: No carotid bruit or JVD.  Cardiovascular:     Rate and Rhythm: Normal rate and regular rhythm.     Heart sounds: Normal heart sounds.  Pulmonary:     Effort: Pulmonary effort is normal. No respiratory distress.     Breath sounds: Normal breath sounds. No wheezing or rales.  Chest:     Chest wall: No tenderness.  Abdominal:     General: Bowel sounds are normal. There is no distension or abdominal bruit.     Palpations: Abdomen is soft. There is no hepatomegaly, splenomegaly, mass or pulsatile mass.     Tenderness: There is no abdominal tenderness.  Musculoskeletal: Normal range of motion.     Right lower leg: Edema (mild) present.     Left lower leg: Edema (mild) present.  Lymphadenopathy:     Cervical: No cervical adenopathy.  Skin:    General: Skin is warm and dry.  Neurological:     Mental Status: She is alert and oriented to person, place, and time.     Deep Tendon Reflexes: Reflexes are normal and symmetric.  Psychiatric:        Behavior: Behavior normal.        Thought Content: Thought content normal.        Judgment: Judgment normal.    BP (!) 142/88    Pulse 66    Temp (!) 96.6 F (35.9 C) (Oral)    Ht '5\' 2"'$  (1.575 m)    Wt 206 lb (93.4 kg)    BMI 37.68 kg/m   INR 2.3        Assessment & Plan:  Melissa Knight comes in today with chief complaint of Medical  Management of Chronic Issues   Diagnosis and orders addressed:  1. Essential hypertension Low sodium diet - amLODipine (NORVASC) 2.5 MG tablet; Take 1 tablet (2.5 mg total) by mouth daily.  Dispense: 90 tablet; Refill: 1 - lisinopril (ZESTRIL) 20 MG tablet; TAKE 1 TABLET (20 MG TOTAL) BY MOUTH 2 (TWO) TIMES DAILY.  Dispense: 180 tablet; Refill: 1 - furosemide (LASIX)  20 MG tablet; TAKE 1 TABLET EVERY DAY AS NEEDED  Dispense: 90 tablet; Refill: 1 - CMP14+EGFR  2. Permanent atrial fibrillation (HCC) - CoaguChek XS/INR Waived See inR documentation - warfarin (COUMADIN) 7.5 MG tablet; Take 1 tablet (7.5 mg total) by mouth one time only at 6 PM.  Dispense: 90 tablet; Refill: 1 - metoprolol tartrate (LOPRESSOR) 50 MG tablet; TAKE 2 TABLETS IN MORNING AND 1 AT BEDTIME  Dispense: 270 tablet; Refill: 1  3. Hyperlipidemia, unspecified hyperlipidemia type Low fat diet - atorvastatin (LIPITOR) 10 MG tablet; Take 1 tablet (10 mg total) by mouth daily at 6 PM.  Dispense: 90 tablet; Refill: 01 - Lipid panel  4. Hypothyroidism (acquired) - levothyroxine (SYNTHROID) 88 MCG tablet; TAKE 1 TABLET (88 MCG TOTAL) BY MOUTH DAILY.  Dispense: 90 tablet; Refill: 2 - Thyroid Panel With TSH  5. Seizure disorder, generalized convulsive, intractable (HCC) - levETIRAcetam (KEPPRA) 500 MG tablet; Take 1 tablet (500 mg total) by mouth daily.  Dispense: 90 tablet; Refill: 1  6. Late effect of cerebrovascular accident  7. Metabolic syndrome Watch carbs in diet  8. Stress incontinence Let me know if decides to take meds for this  9. Class 2 obesity due to excess calories with body mass index (BMI) of 35.0 to 35.9 in adult, unspecified whether serious comorbidity present Discussed diet and exercise for person with BMI >25 Will recheck weight in 3-6 months    Labs pending Health Maintenance reviewed Diet and exercise encouraged  Follow up plan: 6 months   Mary-Margaret Hassell Done, FNP

## 2019-05-10 NOTE — Patient Instructions (Signed)

## 2019-05-14 LAB — CMP14+EGFR
ALT: 14 IU/L (ref 0–32)
AST: 22 IU/L (ref 0–40)
Albumin/Globulin Ratio: 1.8 (ref 1.2–2.2)
Albumin: 3.9 g/dL (ref 3.6–4.6)
Alkaline Phosphatase: 46 IU/L (ref 39–117)
BUN/Creatinine Ratio: 21 (ref 12–28)
BUN: 16 mg/dL (ref 8–27)
Bilirubin Total: 0.5 mg/dL (ref 0.0–1.2)
CO2: 24 mmol/L (ref 20–29)
Calcium: 9.4 mg/dL (ref 8.7–10.3)
Chloride: 103 mmol/L (ref 96–106)
Creatinine, Ser: 0.75 mg/dL (ref 0.57–1.00)
GFR calc Af Amer: 85 mL/min/{1.73_m2} (ref >59)
GFR calc non Af Amer: 73 mL/min/{1.73_m2} (ref >59)
Globulin, Total: 2.2 g/dL (ref 1.5–4.5)
Glucose: 91 mg/dL (ref 65–99)
Potassium: 4.3 mmol/L (ref 3.5–5.2)
Sodium: 143 mmol/L (ref 134–144)
Total Protein: 6.1 g/dL (ref 6.0–8.5)

## 2019-05-14 LAB — LIPID PANEL
Chol/HDL Ratio: 3.7 ratio (ref 0.0–4.4)
Cholesterol, Total: 183 mg/dL (ref 100–199)
HDL: 50 mg/dL (ref >39)
LDL Calculated: 97 mg/dL (ref 0–99)
Triglycerides: 180 mg/dL — ABNORMAL HIGH (ref 0–149)
VLDL Cholesterol Cal: 36 mg/dL (ref 5–40)

## 2019-05-14 LAB — THYROID PANEL WITH TSH
Free Thyroxine Index: 3.2 (ref 1.2–4.9)
T3 Uptake Ratio: 31 % (ref 24–39)
T4, Total: 10.3 ug/dL (ref 4.5–12.0)
TSH: 1.73 u[IU]/mL (ref 0.450–4.500)

## 2019-06-06 ENCOUNTER — Other Ambulatory Visit: Payer: Self-pay

## 2019-06-06 ENCOUNTER — Encounter: Payer: Self-pay | Admitting: Nurse Practitioner

## 2019-06-06 ENCOUNTER — Ambulatory Visit (INDEPENDENT_AMBULATORY_CARE_PROVIDER_SITE_OTHER): Payer: Medicare Other | Admitting: Nurse Practitioner

## 2019-06-06 VITALS — BP 153/87 | HR 64 | Temp 97.2°F | Ht 62.0 in | Wt 206.0 lb

## 2019-06-06 DIAGNOSIS — I4821 Permanent atrial fibrillation: Secondary | ICD-10-CM | POA: Diagnosis not present

## 2019-06-06 LAB — COAGUCHEK XS/INR WAIVED
INR: 2.9 — ABNORMAL HIGH (ref 0.9–1.1)
Prothrombin Time: 34.8 s

## 2019-06-06 NOTE — Progress Notes (Signed)
Subjective:   chief complaint: INR recheck   Indication: atrial fibrillation Bleeding signs/symptoms: None Thromboembolic signs/symptoms: None  Missed Coumadin doses: None Medication changes: no Dietary changes: no Bacterial/viral infection: no Other concerns: no  The following portions of the patient's history were reviewed and updated as appropriate: allergies, current medications, past family history, past medical history, past social history, past surgical history and problem list.  Review of Systems Pertinent items noted in HPI and remainder of comprehensive ROS otherwise negative.   Objective:    INR Today: 2.9  Current dose: coumadin 3.75mg  daily    Assessment:    Therapeutic INR for goal of 2-3   Plan:    1. New dose: no change   2. Next INR: 1 month   Mary-Margaret Hassell Done, FNP

## 2019-07-03 ENCOUNTER — Telehealth: Payer: Self-pay | Admitting: Nurse Practitioner

## 2019-07-04 ENCOUNTER — Ambulatory Visit (INDEPENDENT_AMBULATORY_CARE_PROVIDER_SITE_OTHER): Payer: Medicare Other | Admitting: Nurse Practitioner

## 2019-07-04 ENCOUNTER — Encounter: Payer: Self-pay | Admitting: Nurse Practitioner

## 2019-07-04 ENCOUNTER — Other Ambulatory Visit: Payer: Self-pay

## 2019-07-04 VITALS — BP 165/88 | HR 62 | Temp 96.6°F | Resp 20 | Ht 62.0 in | Wt 206.0 lb

## 2019-07-04 DIAGNOSIS — I4821 Permanent atrial fibrillation: Secondary | ICD-10-CM | POA: Diagnosis not present

## 2019-07-04 DIAGNOSIS — Z23 Encounter for immunization: Secondary | ICD-10-CM

## 2019-07-04 LAB — COAGUCHEK XS/INR WAIVED
INR: 2.1 — ABNORMAL HIGH (ref 0.9–1.1)
Prothrombin Time: 24.7 s

## 2019-07-04 NOTE — Progress Notes (Signed)
Subjective:   Chief Complaints: INR rcheck   Indication: atrial fibrillation Bleeding signs/symptoms: None Thromboembolic signs/symptoms: None  Missed Coumadin doses: None Medication changes: no Dietary changes: no Bacterial/viral infection: no Other concerns: no  The following portions of the patient's history were reviewed and updated as appropriate: allergies, current medications, past family history, past medical history, past social history, past surgical history and problem list.  Review of Systems Pertinent items noted in HPI and remainder of comprehensive ROS otherwise negative.   Objective:    INR Today: 2.1 Current dose: coumadin 3.75mg  daily    Assessment:    Therapeutic INR for goal of 2-3   Plan:    1. New dose: no change   2. Next INR: 1 month    Mary-Margaret Hassell Done, FNP

## 2019-07-04 NOTE — Addendum Note (Signed)
Addended by: Rolena Infante on: 07/04/2019 04:28 PM   Modules accepted: Orders

## 2019-07-31 ENCOUNTER — Other Ambulatory Visit: Payer: Self-pay

## 2019-08-01 ENCOUNTER — Ambulatory Visit (INDEPENDENT_AMBULATORY_CARE_PROVIDER_SITE_OTHER): Payer: Medicare Other | Admitting: Nurse Practitioner

## 2019-08-01 ENCOUNTER — Encounter: Payer: Self-pay | Admitting: Nurse Practitioner

## 2019-08-01 VITALS — BP 142/88 | HR 53 | Temp 97.1°F | Resp 20 | Ht 62.0 in | Wt 205.0 lb

## 2019-08-01 DIAGNOSIS — N393 Stress incontinence (female) (male): Secondary | ICD-10-CM | POA: Diagnosis not present

## 2019-08-01 DIAGNOSIS — I4821 Permanent atrial fibrillation: Secondary | ICD-10-CM | POA: Diagnosis not present

## 2019-08-01 DIAGNOSIS — E039 Hypothyroidism, unspecified: Secondary | ICD-10-CM | POA: Diagnosis not present

## 2019-08-01 DIAGNOSIS — E8881 Metabolic syndrome: Secondary | ICD-10-CM | POA: Diagnosis not present

## 2019-08-01 DIAGNOSIS — I10 Essential (primary) hypertension: Secondary | ICD-10-CM

## 2019-08-01 DIAGNOSIS — Z23 Encounter for immunization: Secondary | ICD-10-CM

## 2019-08-01 DIAGNOSIS — E785 Hyperlipidemia, unspecified: Secondary | ICD-10-CM | POA: Diagnosis not present

## 2019-08-01 LAB — COAGUCHEK XS/INR WAIVED
INR: 2.8 — ABNORMAL HIGH (ref 0.9–1.1)
Prothrombin Time: 34 s

## 2019-08-01 NOTE — Progress Notes (Signed)
Subjective:    Patient ID: Melissa Knight, female    DOB: 11/10/1932, 85 y.o.   MRN: [id number]   Chief Complaint: No chief complaint on file.    HPI:  1. Essential hypertension No c/o chest pain, sob or headche. Does  check blood pressure at home an is usually 607-371'G systolic BP Readings from Last 3 Encounters:  07/04/19 (!) 165/88  06/06/19 (!) 153/87  05/10/19 (!) 142/88     2. Permanent atrial fibrillation (HCC) No c/o palpitations.   Indication: atrial fibrillation Bleeding signs/symptoms: None Thromboembolic signs/symptoms: None  Missed Coumadin doses: None Medication changes: no Dietary changes: no Bacterial/viral infection: no Other concerns: no   3. Hypothyroidism (acquired) No complaints that she is aware BP Readings from Last 3 Encounters:  07/04/19 (!) 165/88  06/06/19 (!) 153/87  05/10/19 (!) 142/88     4. Stress incontinence Is on myrbetriq and is working well.  5. Metabolic syndrome Does not watch diet and does not check blood sugars at home. Lab Results  Component Value Date   HGBA1C 5.5 01/01/2014     6. Hyperlipidemia, unspecified hyperlipidemia type tries to watch diet but does very little exercise  7. Morbid obesity No recent weight changes Wt Readings from Last 3 Encounters:  08/01/19 205 lb (93 kg)  07/04/19 206 lb (93.4 kg)  06/06/19 206 lb (93.4 kg)   BMI Readings from Last 3 Encounters:  08/01/19 37.49 kg/m  07/04/19 37.68 kg/m  06/06/19 37.68 kg/m      Outpatient Encounter Medications as of 08/01/2019  Medication Sig  . amLODipine (NORVASC) 2.5 MG tablet Take 1 tablet (2.5 mg total) by mouth daily.  Marland Kitchen atorvastatin (LIPITOR) 10 MG tablet Take 1 tablet (10 mg total) by mouth daily at 6 PM.  . betamethasone dipropionate (DIPROLENE) 0.05 % cream APPLY TO AFFECTED AREAS AS NEEDED  . calcium-vitamin D (OSCAL WITH D) 500-200 MG-UNIT tablet Take 1 tablet by mouth.  . furosemide (LASIX) 20 MG tablet TAKE 1 TABLET EVERY  DAY AS NEEDED  . levETIRAcetam (KEPPRA) 500 MG tablet Take 1 tablet (500 mg total) by mouth daily.  Marland Kitchen levothyroxine (SYNTHROID) 88 MCG tablet TAKE 1 TABLET (88 MCG TOTAL) BY MOUTH DAILY.  Marland Kitchen lisinopril (ZESTRIL) 20 MG tablet TAKE 1 TABLET (20 MG TOTAL) BY MOUTH 2 (TWO) TIMES DAILY.  . metoprolol tartrate (LOPRESSOR) 50 MG tablet TAKE 2 TABLETS IN MORNING AND 1 AT BEDTIME  . warfarin (COUMADIN) 7.5 MG tablet Take 1 tablet (7.5 mg total) by mouth one time only at 6 PM.       Past Surgical History:  Procedure Laterality Date  . BLADDER SURGERY     bladder prolapse  . TUBAL LIGATION      Family History  Problem Relation Age of Onset  . Atrial fibrillation Mother        pacemaker  . Hypertension Mother   . Heart disease Sister        MI in 39s  . Stroke Sister   . Heart disease Father   . Heart disease Sister   . Heart disease Sister   . Hypertension Sister   . Anxiety disorder Sister   . Heart disease Brother   . Cancer Brother        pancreatic  . Heart disease Son   . Stroke Son 9    New complaints: None today  Social history: Lives with husband  Controlled substance contract: n/a    Review of Systems  Constitutional: Negative  for activity change and appetite change.  HENT: Negative.   Eyes: Negative for pain.  Respiratory: Negative for shortness of breath.   Cardiovascular: Negative for chest pain, palpitations and leg swelling.  Gastrointestinal: Negative for abdominal pain.  Endocrine: Negative for polydipsia.  Genitourinary: Negative.   Skin: Negative for rash.  Neurological: Negative for dizziness, weakness and headaches.  Hematological: Does not bruise/bleed easily.  Psychiatric/Behavioral: Negative.   All other systems reviewed and are negative.      Objective:   Physical Exam Vitals signs and nursing note reviewed.  Constitutional:      General: She is not in acute distress.    Appearance: Normal appearance. She is well-developed.  HENT:      Head: Normocephalic.     Nose: Nose normal.  Eyes:     Pupils: Pupils are equal, round, and reactive to light.  Neck:     Musculoskeletal: Normal range of motion and neck supple.     Vascular: No carotid bruit or JVD.  Cardiovascular:     Rate and Rhythm: Normal rate and regular rhythm.     Heart sounds: Normal heart sounds.  Pulmonary:     Effort: Pulmonary effort is normal. No respiratory distress.     Breath sounds: Normal breath sounds. No wheezing or rales.  Chest:     Chest wall: No tenderness.  Abdominal:     General: Bowel sounds are normal. There is no distension or abdominal bruit.     Palpations: Abdomen is soft. There is no hepatomegaly, splenomegaly, mass or pulsatile mass.     Tenderness: There is no abdominal tenderness.  Musculoskeletal: Normal range of motion.  Lymphadenopathy:     Cervical: No cervical adenopathy.  Skin:    General: Skin is warm and dry.  Neurological:     Mental Status: She is alert and oriented to person, place, and time.     Deep Tendon Reflexes: Reflexes are normal and symmetric.  Psychiatric:        Behavior: Behavior normal.        Thought Content: Thought content normal.        Judgment: Judgment normal.     INR 2.8  BP (!) 142/88 (BP Location: Right Arm, Cuff Size: Normal)   Pulse (!) 53   Temp (!) 97.1 F (36.2 C) (Temporal)   Resp 20   Ht 5' 2"  (1.575 m)   Wt 205 lb (93 kg)   SpO2 97%   BMI 37.49 kg/m       Assessment & Plan:  Athina Browder comes in today with chief complaint of Medical Management of Chronic Issues   Diagnosis and orders addressed:  1. Essential hypertension Low sodium diet - CMP14+EGFR  2. Permanent atrial fibrillation (HCC) Continue coumadin 3.71m daily recheck INR in 1 month - inr FINGERSTICK  3. Hypothyroidism (acquired) - Thyroid Panel With TSH  4. Stress incontinence continuemyrbetriq daily  5. Metabolic syndrome Watch carbs in diet  6. Hyperlipidemia, unspecified  hyperlipidemia type Low fta diet - Lipid panel  7. Morbid obesity ([facility] Discussed diet and exercise for person with BMI >25 Will recheck weight in 3-6 months   Labs pending Health Maintenance reviewed Diet and exercise encouraged  Follow up plan: 1 month INR   Mary-Margaret MHassell Done FNP

## 2019-08-01 NOTE — Patient Instructions (Signed)
Influenza Virus Vaccine (Flucelvax) What is this medicine? INFLUENZA VIRUS VACCINE (in floo EN zuh VAHY ruhs vak SEEN) helps to reduce the risk of getting influenza also known as the flu. The vaccine only helps protect you against some strains of the flu. This medicine may be used for other purposes; ask your health care provider or pharmacist if you have questions. COMMON BRAND NAME(S): FLUCELVAX What should I tell my health care provider before I take this medicine? They need to know if you have any of these conditions:  bleeding disorder like hemophilia  fever or infection  Guillain-Barre syndrome or other neurological problems  immune system problems  infection with the human immunodeficiency virus (HIV) or AIDS  low blood platelet counts  multiple sclerosis  an unusual or allergic reaction to influenza virus vaccine, other medicines, foods, dyes or preservatives  pregnant or trying to get pregnant  breast-feeding How should I use this medicine? This vaccine is for injection into a muscle. It is given by a health care professional. A copy of Vaccine Information Statements will be given before each vaccination. Read this sheet carefully each time. The sheet may change frequently. Talk to your pediatrician regarding the use of this medicine in children. Special care may be needed. Overdosage: If you think you've taken too much of this medicine contact a poison control center or emergency room at once. Overdosage: If you think you have taken too much of this medicine contact a poison control center or emergency room at once. NOTE: This medicine is only for you. Do not share this medicine with others. What if I miss a dose? This does not apply. What may interact with this medicine?  chemotherapy or radiation therapy  medicines that lower your immune system like etanercept, anakinra, infliximab, and adalimumab  medicines that treat or prevent blood clots like  warfarin  phenytoin  steroid medicines like prednisone or cortisone  theophylline  vaccines This list may not describe all possible interactions. Give your health care provider a list of all the medicines, herbs, non-prescription drugs, or dietary supplements you use. Also tell them if you smoke, drink alcohol, or use illegal drugs. Some items may interact with your medicine. What should I watch for while using this medicine? Report any side effects that do not go away within 3 days to your doctor or health care professional. Call your health care provider if any unusual symptoms occur within 6 weeks of receiving this vaccine. You may still catch the flu, but the illness is not usually as bad. You cannot get the flu from the vaccine. The vaccine will not protect against colds or other illnesses that may cause fever. The vaccine is needed every year. What side effects may I notice from receiving this medicine? Side effects that you should report to your doctor or health care professional as soon as possible:  allergic reactions like skin rash, itching or hives, swelling of the face, lips, or tongue Side effects that usually do not require medical attention (Report these to your doctor or health care professional if they continue or are bothersome.):  fever  headache  muscle aches and pains  pain, tenderness, redness, or swelling at the injection site  tiredness This list may not describe all possible side effects. Call your doctor for medical advice about side effects. You may report side effects to FDA at 1-800-FDA-1088. Where should I keep my medicine? The vaccine will be given by a health care professional in a clinic, pharmacy, doctor's   office, or other health care setting. You will not be given vaccine doses to store at home. NOTE: This sheet is a summary. It may not cover all possible information. If you have questions about this medicine, talk to your doctor, pharmacist, or  health care provider.  2020 Elsevier/Gold Standard (2011-08-10 14:06:47)  

## 2019-08-02 LAB — LIPID PANEL
Chol/HDL Ratio: 3.8 ratio (ref 0.0–4.4)
Cholesterol, Total: 188 mg/dL (ref 100–199)
HDL: 50 mg/dL (ref >39)
LDL Chol Calc (NIH): 109 mg/dL — ABNORMAL HIGH (ref 0–99)
Triglycerides: 165 mg/dL — ABNORMAL HIGH (ref 0–149)
VLDL Cholesterol Cal: 29 mg/dL (ref 5–40)

## 2019-08-02 LAB — CMP14+EGFR
ALT: 15 IU/L (ref 0–32)
AST: 19 IU/L (ref 0–40)
Albumin/Globulin Ratio: 2 (ref 1.2–2.2)
Albumin: 4.1 g/dL (ref 3.6–4.6)
Alkaline Phosphatase: 55 IU/L (ref 39–117)
BUN/Creatinine Ratio: 14 (ref 12–28)
BUN: 10 mg/dL (ref 8–27)
Bilirubin Total: 0.7 mg/dL (ref 0.0–1.2)
CO2: 25 mmol/L (ref 20–29)
Calcium: 9.2 mg/dL (ref 8.7–10.3)
Chloride: 105 mmol/L (ref 96–106)
Creatinine, Ser: 0.73 mg/dL (ref 0.57–1.00)
GFR calc Af Amer: 87 mL/min/{1.73_m2} (ref >59)
GFR calc non Af Amer: 76 mL/min/{1.73_m2} (ref >59)
Globulin, Total: 2.1 g/dL (ref 1.5–4.5)
Glucose: 89 mg/dL (ref 65–99)
Potassium: 4.2 mmol/L (ref 3.5–5.2)
Sodium: 143 mmol/L (ref 134–144)
Total Protein: 6.2 g/dL (ref 6.0–8.5)

## 2019-08-03 LAB — SPECIMEN STATUS REPORT

## 2019-08-03 LAB — THYROID PANEL WITH TSH
Free Thyroxine Index: 3.1 (ref 1.2–4.9)
T3 Uptake Ratio: 29 % (ref 24–39)
T4, Total: 10.7 ug/dL (ref 4.5–12.0)
TSH: 1.73 u[IU]/mL (ref 0.450–4.500)

## 2019-08-28 ENCOUNTER — Other Ambulatory Visit: Payer: Self-pay

## 2019-08-29 ENCOUNTER — Encounter: Payer: Self-pay | Admitting: Nurse Practitioner

## 2019-08-29 ENCOUNTER — Ambulatory Visit (INDEPENDENT_AMBULATORY_CARE_PROVIDER_SITE_OTHER): Payer: Medicare Other | Admitting: Nurse Practitioner

## 2019-08-29 VITALS — BP 150/89 | HR 62 | Temp 96.6°F | Resp 20 | Ht 62.0 in | Wt 209.0 lb

## 2019-08-29 DIAGNOSIS — I4821 Permanent atrial fibrillation: Secondary | ICD-10-CM

## 2019-08-29 LAB — COAGUCHEK XS/INR WAIVED
INR: 2.6 — ABNORMAL HIGH (ref 0.9–1.1)
Prothrombin Time: 30.8 s

## 2019-08-29 NOTE — Progress Notes (Signed)
Subjective:   Chief Complaint: INR reheck   Indication: atrial fibrillation Bleeding signs/symptoms: None Thromboembolic signs/symptoms: None  Missed Coumadin doses: None Medication changes: no Dietary changes: no Bacterial/viral infection: no Other concerns: no  The following portions of the patient's history were reviewed and updated as appropriate: allergies, current medications, past family history, past medical history, past social history, past surgical history and problem list.  Review of Systems Pertinent items noted in HPI and remainder of comprehensive ROS otherwise negative.   Objective:    INR Today: 2.6 Current dose: coumadin 3.75mg  daily    Assessment:    Therapeutic INR for goal of 2-3   Plan:    1. New dose: no change   2. Next INR: 1 month    Mary-Margaret Hassell Done, FNP

## 2019-09-27 ENCOUNTER — Encounter: Payer: Self-pay | Admitting: Nurse Practitioner

## 2019-09-27 ENCOUNTER — Ambulatory Visit (INDEPENDENT_AMBULATORY_CARE_PROVIDER_SITE_OTHER): Payer: Medicare Other | Admitting: Nurse Practitioner

## 2019-09-27 ENCOUNTER — Other Ambulatory Visit: Payer: Self-pay

## 2019-09-27 VITALS — BP 136/88 | HR 60 | Temp 97.5°F | Resp 20 | Ht 62.0 in | Wt 209.0 lb

## 2019-09-27 DIAGNOSIS — I4821 Permanent atrial fibrillation: Secondary | ICD-10-CM | POA: Diagnosis not present

## 2019-09-27 LAB — COAGUCHEK XS/INR WAIVED
INR: 2.6 — ABNORMAL HIGH (ref 0.9–1.1)
Prothrombin Time: 30.7 s

## 2019-09-27 NOTE — Progress Notes (Signed)
Subjective:   Chief complaint: INR recheck   Indication: atrial fibrillation Bleeding signs/symptoms: None Thromboembolic signs/symptoms: None  Missed Coumadin doses: None Medication changes: no Dietary changes: no Bacterial/viral infection: no Other concerns: no  The following portions of the patient's history were reviewed and updated as appropriate: allergies, current medications, past family history, past medical history, past social history, past surgical history and problem list.  Review of Systems Pertinent items noted in HPI and remainder of comprehensive ROS otherwise negative.   Objective:    INR Today: 2.6 Current dose: coumadin 3.75mg  daily    Assessment:    Therapeutic INR for goal of 2-3   Plan:    1. New dose: no change   2. Next INR: 1 month    Mary-Margaret Hassell Done, FNP

## 2019-10-18 DIAGNOSIS — Z23 Encounter for immunization: Secondary | ICD-10-CM | POA: Diagnosis not present

## 2019-10-28 ENCOUNTER — Ambulatory Visit: Payer: Self-pay | Admitting: Nurse Practitioner

## 2019-11-01 ENCOUNTER — Other Ambulatory Visit: Payer: Self-pay

## 2019-11-01 ENCOUNTER — Ambulatory Visit: Payer: Medicare Other | Admitting: Nurse Practitioner

## 2019-11-08 ENCOUNTER — Other Ambulatory Visit: Payer: Self-pay

## 2019-11-11 ENCOUNTER — Ambulatory Visit (INDEPENDENT_AMBULATORY_CARE_PROVIDER_SITE_OTHER): Payer: Medicare Other | Admitting: Nurse Practitioner

## 2019-11-11 ENCOUNTER — Encounter: Payer: Self-pay | Admitting: Nurse Practitioner

## 2019-11-11 ENCOUNTER — Other Ambulatory Visit: Payer: Self-pay

## 2019-11-11 VITALS — BP 142/78 | HR 74 | Temp 97.3°F | Resp 20 | Ht 62.0 in | Wt 208.0 lb

## 2019-11-11 DIAGNOSIS — E785 Hyperlipidemia, unspecified: Secondary | ICD-10-CM

## 2019-11-11 DIAGNOSIS — I6930 Unspecified sequelae of cerebral infarction: Secondary | ICD-10-CM | POA: Diagnosis not present

## 2019-11-11 DIAGNOSIS — G40319 Generalized idiopathic epilepsy and epileptic syndromes, intractable, without status epilepticus: Secondary | ICD-10-CM

## 2019-11-11 DIAGNOSIS — E8881 Metabolic syndrome: Secondary | ICD-10-CM

## 2019-11-11 DIAGNOSIS — E039 Hypothyroidism, unspecified: Secondary | ICD-10-CM | POA: Diagnosis not present

## 2019-11-11 DIAGNOSIS — I10 Essential (primary) hypertension: Secondary | ICD-10-CM

## 2019-11-11 DIAGNOSIS — I4821 Permanent atrial fibrillation: Secondary | ICD-10-CM | POA: Diagnosis not present

## 2019-11-11 DIAGNOSIS — R739 Hyperglycemia, unspecified: Secondary | ICD-10-CM | POA: Diagnosis not present

## 2019-11-11 LAB — COAGUCHEK XS/INR WAIVED
INR: 2.1 — ABNORMAL HIGH (ref 0.9–1.1)
Prothrombin Time: 24.7 s

## 2019-11-11 LAB — BAYER DCA HB A1C WAIVED: HB A1C (BAYER DCA - WAIVED): 5.9 % (ref <7.0)

## 2019-11-11 MED ORDER — LISINOPRIL 20 MG PO TABS: ORAL_TABLET | ORAL | 1 refills | Status: DC

## 2019-11-11 MED ORDER — FUROSEMIDE 20 MG PO TABS: ORAL_TABLET | ORAL | 1 refills | Status: DC

## 2019-11-11 MED ORDER — LEVOTHYROXINE SODIUM 88 MCG PO TABS: ORAL_TABLET | ORAL | 2 refills | Status: DC

## 2019-11-11 MED ORDER — AMLODIPINE BESYLATE 2.5 MG PO TABS: 2.5000 mg | ORAL_TABLET | Freq: Every day | ORAL | 1 refills | Status: DC

## 2019-11-11 MED ORDER — WARFARIN SODIUM 7.5 MG PO TABS: 7.5000 mg | ORAL_TABLET | Freq: Once | ORAL | 1 refills | Status: DC

## 2019-11-11 MED ORDER — LEVETIRACETAM 500 MG PO TABS: 500.0000 mg | ORAL_TABLET | Freq: Every day | ORAL | 1 refills | Status: DC

## 2019-11-11 MED ORDER — ATORVASTATIN CALCIUM 10 MG PO TABS: 10.0000 mg | ORAL_TABLET | Freq: Every day | ORAL | Status: DC

## 2019-11-11 NOTE — Progress Notes (Signed)
Subjective:    Patient ID: Melissa Knight, female    DOB: 25-Feb-1933, 86 y.o.   MRN: [id number]   Chief Complaint: Medical Management of Chronic Issues    HPI:  1. Permanent atrial fibrillation (HCC) denies any palpitations. Avoids caffeine will recheck INR on he rtoday  2. Metabolic syndrome She does not check blood sugars at home.  Lab Results  Component Value Date   HGBA1C 5.5 01/01/2014     3. Essential hypertension No c/o chest pain, sob or headaches. She does not check her blood pressure very often at home. BP Readings from Last 3 Encounters:  11/11/19 (!) 172/85  09/27/19 136/88  08/29/19 (!) 150/89     4. Hyperlipidemia, unspecified hyperlipidemia type Does watch fried foods in diet. Does no exercise. Lab Results  Component Value Date   CHOL 188 08/01/2019   HDL 50 08/01/2019   LDLCALC 109 (H) 08/01/2019   TRIG 165 (H) 08/01/2019   CHOLHDL 3.8 08/01/2019     5. Hypothyroidism (acquired) No problems that she is aware. Of. Lab Results  Component Value Date   TSH 1.730 08/01/2019    6. Late effect of cerebrovascular accident No residual effects.  7. Seizure disorder, generalized convulsive, intractable ([place]) Has had no seizure activity in years is still taking keppra  8. Morbid obesity ([place]) No recent weight changes Wt Readings from Last 3 Encounters:  11/11/19 208 lb (94.3 kg)  09/27/19 209 lb (94.8 kg)  08/29/19 209 lb (94.8 kg)   BMI Readings from Last 3 Encounters:  11/11/19 38.04 kg/m  09/27/19 38.23 kg/m  08/29/19 38.23 kg/m       Outpatient Encounter Medications as of 11/11/2019  Medication Sig  . amLODipine (NORVASC) 2.5 MG tablet Take 1 tablet (2.5 mg total) by mouth daily.  Marland Kitchen atorvastatin (LIPITOR) 10 MG tablet Take 1 tablet (10 mg total) by mouth daily at 6 PM.  . betamethasone dipropionate (DIPROLENE) 0.05 % cream APPLY TO AFFECTED AREAS AS NEEDED  . calcium-vitamin D (OSCAL WITH D) 500-200 MG-UNIT tablet Take 1 tablet by  mouth.  . furosemide (LASIX) 20 MG tablet TAKE 1 TABLET EVERY DAY AS NEEDED  . levETIRAcetam (KEPPRA) 500 MG tablet Take 1 tablet (500 mg total) by mouth daily.  Marland Kitchen levothyroxine (SYNTHROID) 88 MCG tablet TAKE 1 TABLET (88 MCG TOTAL) BY MOUTH DAILY.  Marland Kitchen lisinopril (ZESTRIL) 20 MG tablet TAKE 1 TABLET (20 MG TOTAL) BY MOUTH 2 (TWO) TIMES DAILY.  . metoprolol tartrate (LOPRESSOR) 50 MG tablet TAKE 2 TABLETS IN MORNING AND 1 AT BEDTIME  . warfarin (COUMADIN) 7.5 MG tablet Take 1 tablet (7.5 mg total) by mouth one time only at 6 PM.     Past Surgical History:  Procedure Laterality Date  . BLADDER SURGERY     bladder prolapse  . TUBAL LIGATION      Family History  Problem Relation Age of Onset  . Atrial fibrillation Mother        pacemaker  . Hypertension Mother   . Heart disease Sister        MI in 66s  . Stroke Sister   . Heart disease Father   . Heart disease Sister   . Heart disease Sister   . Hypertension Sister   . Anxiety disorder Sister   . Heart disease Brother   . Cancer Brother        pancreatic  . Heart disease Son   . Stroke Son 8    New complaints: None  today  Social history: Lives with husband  Controlled substance contract: n/a    Review of Systems  Constitutional: Negative for diaphoresis.  Eyes: Negative for pain.  Respiratory: Negative for shortness of breath.   Cardiovascular: Negative for chest pain, palpitations and leg swelling.  Gastrointestinal: Negative for abdominal pain.  Endocrine: Negative for polydipsia.  Skin: Negative for rash.  Neurological: Negative for dizziness, weakness and headaches.  Hematological: Does not bruise/bleed easily.  All other systems reviewed and are negative.      Objective:   Physical Exam Vitals and nursing note reviewed.  Constitutional:      General: She is not in acute distress.    Appearance: Normal appearance. She is well-developed.  HENT:     Head: Normocephalic.     Nose: Nose normal.  Eyes:      Pupils: Pupils are equal, round, and reactive to light.  Neck:     Vascular: No carotid bruit or JVD.  Cardiovascular:     Rate and Rhythm: Normal rate. Rhythm irregular.     Heart sounds: Normal heart sounds.  Pulmonary:     Effort: Pulmonary effort is normal. No respiratory distress.     Breath sounds: Normal breath sounds. No wheezing or rales.  Chest:     Chest wall: No tenderness.  Abdominal:     General: Bowel sounds are normal. There is no distension or abdominal bruit.     Palpations: Abdomen is soft. There is no hepatomegaly, splenomegaly, mass or pulsatile mass.     Tenderness: There is no abdominal tenderness.  Musculoskeletal:        General: Normal range of motion.     Cervical back: Normal range of motion and neck supple.  Lymphadenopathy:     Cervical: No cervical adenopathy.  Skin:    General: Skin is warm and dry.  Neurological:     Mental Status: She is alert and oriented to person, place, and time.     Deep Tendon Reflexes: Reflexes are normal and symmetric.  Psychiatric:        Behavior: Behavior normal.        Thought Content: Thought content normal.        Judgment: Judgment normal.    INR 2.1  hgba1c 5.7% BP (!) 142/78 (BP Location: Left Arm, Cuff Size: Normal)   Pulse 74   Temp (!) 97.3 F (36.3 C) (Temporal)   Resp 20   Ht _0  (1.575 m)   Wt 208 lb (94.3 kg)   SpO2 96%   BMI 38.04 kg/m       Assessment & Plan:  Melissa Knight comes in today with chief complaint of Medical Management of Chronic Issues   Diagnosis and orders addressed:  1. Permanent atrial fibrillation (HCC) - CoaguChek XS/INR Waived - warfarin (COUMADIN) 7.5 MG tablet; Take 1 tablet (7.5 mg total) by mouth one time only at 6 PM.  Dispense: 90 tablet; Refill: 1  2. Metabolic syndrome wathc carbs in diet - Bayer DCA Hb A1c Waived  3. Essential hypertension Low sodium diet - CBC with Differential/Platelet - CMP14+EGFR - amLODipine (NORVASC) 2.5 MG tablet; Take  1 tablet (2.5 mg total) by mouth daily.  Dispense: 90 tablet; Refill: 1 - lisinopril (ZESTRIL) 20 MG tablet; TAKE 1 TABLET (20 MG TOTAL) BY MOUTH 2 (TWO) TIMES DAILY.  Dispense: 180 tablet; Refill: 1 - furosemide (LASIX) 20 MG tablet; TAKE 1 TABLET EVERY DAY AS NEEDED  Dispense: 90 tablet; Refill: 1  4. Hyperlipidemia,  unspecified hyperlipidemia type Low fat diet - Lipid panel - atorvastatin (LIPITOR) 10 MG tablet; Take 1 tablet (10 mg total) by mouth daily at 6 PM.  Dispense: 90 tablet; Refill: 01  5. Hypothyroidism (acquired) - levothyroxine (SYNTHROID) 88 MCG tablet; TAKE 1 TABLET (88 MCG TOTAL) BY MOUTH DAILY.  Dispense: 90 tablet; Refill: 2  6. Late effect of cerebrovascular accident  7. Seizure disorder, generalized convulsive, intractable (HCC) - levETIRAcetam (KEPPRA) 500 MG tablet; Take 1 tablet (500 mg total) by mouth daily.  Dispense: 90 tablet; Refill: 1  8. Morbid obesity ([place]) Discussed diet and exercise for person with BMI >25 Will recheck weight in 3-6 months   Labs pending Health Maintenance reviewed Diet and exercise encouraged  Follow up plan: 1 month for INR   Mary-Margaret Hassell Done, FNP

## 2019-11-11 NOTE — Patient Instructions (Signed)
Warfarin Coagulopathy Warfarin coagulopathy refers to bleeding that may occur as a complication of the medicine warfarin. Warfarin is a blood thinner (anticoagulant). Anticoagulants prevent dangerous blood clots. Bleeding is the most common and most serious complication of warfarin. While taking warfarin, you will need to have blood tests (prothrombin tests, or PT tests) regularly to measure your blood clotting time. The PT test results will be reported as the International Normalized Ratio (INR). The INR tells your health care provider whether your dosage of warfarin needs to be changed. The longer it takes your blood to clot, the higher the INR. Your risk of warfarin coagulopathy increases as your INR increases. What are the causes? This condition may be caused by:  Taking too much warfarin (overdose).  Underlying medical conditions.  Changes to your diet.  Interactions with medicines, supplements, or alcohol. What are the signs or symptoms? Warfarin coagulopathy may cause bleeding from any tissue or organ. Symptoms may include:  Bleeding from the gums.  A nosebleed that is not easily stopped.  Blood in stool. This may look like bright red, dark, or black, tarry stools.  Blood in urine. This may look like pink, red, or brown urine.  Unusual bruising or bruising easily.  A cut that does not stop bleeding within 10 minutes.  Coughing up blood.  Vomiting blood.  Feeling nauseous for longer than 1 day.  Broken blood vessels in the eye (subconjunctival hemorrhage). This may look like a bright red or dark red patch on the white part of the eye.  Abdominal or back pain with or without bruising.  Sudden, severe headache.  Sudden weakness or numbness of the face, arm, or leg, especially on one side of the body.  Sudden confusion.  Difficulty speaking (aphasia) or understanding speech.  Sudden trouble seeing out of one or both eyes.  Unexpected difficulty  walking.  Dizziness.  Loss of balance or coordination.  Unusual vaginal bleeding.  Swelling or pain at an injection site.  Skin scarring due to tissue death (necrosis) of fatty tissue. This may cause pain in the waist, thighs, or buttocks. This is more common among women. How is this diagnosed? This condition is diagnosed after your health care provider places you on warfarin and then finds out how it affects your blood's ability to clot. Prothrombin time (PT) clotting tests are used to monitor your clotting factor. These tests also help your health care provider to find the warfarin dose that is best for you. How is this treated? This condition is treated with vitamin K. Vitamin K helps the blood to clot. You may receive vitamin K every 12 hours, or as needed. You may also receive donated plasma (transfusions of fresh frozen plasma). Plasma is the liquid part of blood, and contains substances that help the blood clot. Follow these instructions at home: Medicines  Take warfarin exactly as told by your health care provider. This ensures that you avoid bleeding or clots that could result in serious injury, pain, or disability.  Take your medicine at the same time every day. If you forget to take your dose of warfarin, take it as soon as you remember on that day. If you do not remember to take it on that day, do not take an extra dose the next day.  Contact your health care provider if you miss a dose or take an extra dose. Do not change your dosage on your own to make up for missed or extra doses.  Talk with your health  care provider or your pharmacist before starting or stopping any new medicines. Many prescription and over-the-counter medicines can interfere with warfarin. This includes over-the-counter vitamins, dietary supplements, herbal medicines, and pain medicines. Your warfarin dosage may need to be adjusted. Eating and drinking  It is important to maintain a normal, balanced diet  while taking warfarin. Avoid major changes in your diet. If you are planning to change your diet, talk with your health care provider before making changes.  Your health care provider may recommend that you work with a diet and nutrition specialist (dietitian).  Vitamin K makes warfarin less effective. It is found in many foods. Eat a consistent amount of foods that contain vitamin K. For example, you may decide to eat 2 vitamin K-containing foods each day. Your warfarin dose is set according to the amount of vitamin K in your blood.  Eat a consistent amount of foods that contain vitamin K. Vitamin K makes warfarin less effective, so eating the same amount each day enables your health care provider to set the correct dose of warfarin. You may decide to eat 2 vitamin K-containing foods each day. Tests   Make sure to have PT tests at least once every 4-6 weeks for the entire time you are taking warfarin.  Ask your health care provider what your target INR range is. Make sure you always know your target range. If your INR is not in your target range, your health care provider may adjust your dosage. Preventing bleeding and injury  Some common over-the-counter medicines and supplements may increase the risk of bleeding while taking warfarin. They include: ? Acetaminophen. ? Aspirin. ? NSAIDs, such as ibuprofen or naproxen. ? Vitamin E.  Avoid situations that cause bleeding. You may bleed more easily while taking warfarin. To limit bleeding, take the following actions: ? Use a softer toothbrush. ? Floss with waxed floss, not unwaxed floss. ? Shave with an electric razor, not with a blade. ? Limit your use of sharp objects. ? Avoid potentially harmful activities, such as contact sports. General instructions  Wear or carry identification that says that you are taking warfarin.  Make sure that all health care providers, including your dentist, know that you are taking warfarin.  If you need  surgery, tell your health care provider that you are taking warfarin. You may have to stop taking warfarin before your surgery.  If you plan to breastfeed or become pregnant while taking warfarin, talk with your health care provider.  Avoid alcohol, tobacco, and drugs. ? If your health care provider approves, limit alcohol intake to no more than 1 drink a day for non-pregnant women and 2 drinks a day for men. One drink equals 12 oz. of beer, 5 oz. of wine, or 1 oz. of hard liquor. ? If you change the amount of nicotine, tobacco, or alcohol you use, tell your health care provider.  Keep all follow-up visits and lab visits as told by your health care provider. This is very important because warfarin is a medicine that needs to be closely monitored. Contact a health care provider if:  You miss a dose.  You take an extra dose.  You plan to have any kind of surgery or procedure.  You are unable to take your medicine due to nausea, vomiting, or diarrhea.  You have any major changes in your diet, or you plan to make major changes in your diet.  You start or stop any over-the-counter medicine, prescription medicine, or dietary supplement.  You become pregnant, plan to become pregnant, or think you may be pregnant.  You have menstrual periods that are heavier than usual, or unusual vaginal bleeding.  You have unusual bruising.  You lose your appetite.  You have a fever.  You have diarrhea that lasts for more than 24 hours. Get help right away if:  You develop symptoms of an allergic reaction, such as: ? Swelling of the lips, face, tongue, mouth, or throat. ? Rash. ? Itching. ? Itchy, red, swollen areas of skin (hives). ? Trouble breathing. ? Chest tightness.  You have any symptoms of stroke. BEFAST is an easy way to remember the main warning signs of stroke: ? B - Balance. Signs are dizziness, sudden trouble walking, or loss of balance. ? E - Eye. Signs are trouble seeing or a  sudden change in vision. ? F - Face. Signs are sudden weakness or numbness of the face, or the face or eyelid drooping on one side. ? A - Arm. Signs are weakness or numbness in an arm. This happens suddenly and usually on one side of the body. ? S - Speech. Signs are trouble speaking, slurred speech, or trouble understanding speech. ? T - Time. Time to call emergency services. Write down the time your symptoms started.  You have other signs of stroke, such as: ? A sudden, severe headache with no known cause. ? Nausea or vomiting. ? Seizure.  You have signs or symptoms of a blood clot, such as: ? Pain or swelling in your leg or arm. ? Skin that is red or warm to the touch on your arm or leg. ? Shortness of breath or difficulty breathing. ? Chest pain. ? Unexplained fever.  You have: ? A fall or have an accident, especially if you hit your head. ? Blood in your urine. Your urine may look reddish, pinkish, or tea-colored. ? Blood in your stool. Your stool may be black or bright red. ? Bleeding that does not stop after applying pressure to the area for 30 minutes. ? Severe pain in your joints or back. ? Purple or blue toes. ? Skin ulcers that do not go away.  You vomit blood or cough up blood. The blood may be bright red, or it may look like coffee grounds. These symptoms may represent a serious problem that is an emergency. Do not wait to see if the symptoms will go away. Get medical help right away. Call your local emergency services (911 in the [place] Do not drive yourself to the hospital. Summary  Warfarin needs to be closely monitored with blood tests. It is very important to keep all lab visits and follow-up visits with your health care provider. Make sure you know your target INR range and your warfarin dosage.  Monitor how much vitamin K you eat every day. Try to eat the same amount every day.  Wear or carry identification that says that you are taking warfarin.  Take  warfarin at the same time every day. Call your health care provider if you miss a dose or if you take an extra dose. Do not change the dosage of warfarin on your own.  Know the signs and symptoms of blood clots, bleeding, and stroke. Know when to get emergency medical help. This information is not intended to replace advice given to you by your health care provider. Make sure you discuss any questions you have with your health care provider. Document Revised: 08/11/2017 Document Reviewed: 11/16/2016 Elsevier Patient Education  2020 [employer]  

## 2019-11-12 LAB — CMP14+EGFR
ALT: 10 IU/L (ref 0–32)
AST: 17 IU/L (ref 0–40)
Albumin/Globulin Ratio: 2 (ref 1.2–2.2)
Albumin: 4.1 g/dL (ref 3.6–4.6)
Alkaline Phosphatase: 49 IU/L (ref 39–117)
BUN/Creatinine Ratio: 18 (ref 12–28)
BUN: 14 mg/dL (ref 8–27)
Bilirubin Total: 0.5 mg/dL (ref 0.0–1.2)
CO2: 22 mmol/L (ref 20–29)
Calcium: 8.9 mg/dL (ref 8.7–10.3)
Chloride: 104 mmol/L (ref 96–106)
Creatinine, Ser: 0.77 mg/dL (ref 0.57–1.00)
GFR calc Af Amer: 81 mL/min/{1.73_m2} (ref >59)
GFR calc non Af Amer: 71 mL/min/{1.73_m2} (ref >59)
Globulin, Total: 2.1 g/dL (ref 1.5–4.5)
Glucose: 100 mg/dL — ABNORMAL HIGH (ref 65–99)
Potassium: 4.4 mmol/L (ref 3.5–5.2)
Sodium: 142 mmol/L (ref 134–144)
Total Protein: 6.2 g/dL (ref 6.0–8.5)

## 2019-11-12 LAB — CBC WITH DIFFERENTIAL/PLATELET
Basophils Absolute: 0 10*3/uL (ref 0.0–0.2)
Basos: 0 %
EOS (ABSOLUTE): 0.1 10*3/uL (ref 0.0–0.4)
Eos: 2 %
Hematocrit: 42.5 % (ref 34.0–46.6)
Hemoglobin: 14.7 g/dL (ref 11.1–15.9)
Immature Grans (Abs): 0 10*3/uL (ref 0.0–0.1)
Immature Granulocytes: 0 %
Lymphocytes Absolute: 1.9 10*3/uL (ref 0.7–3.1)
Lymphs: 27 %
MCH: 30.3 pg (ref 26.6–33.0)
MCHC: 34.6 g/dL (ref 31.5–35.7)
MCV: 88 fL (ref 79–97)
Monocytes Absolute: 0.6 10*3/uL (ref 0.1–0.9)
Monocytes: 9 %
Neutrophils Absolute: 4.2 10*3/uL (ref 1.4–7.0)
Neutrophils: 62 %
Platelets: 180 10*3/uL (ref 150–450)
RBC: 4.85 x10E6/uL (ref 3.77–5.28)
RDW: 12.7 % (ref 11.7–15.4)
WBC: 6.9 10*3/uL (ref 3.4–10.8)

## 2019-11-12 LAB — LIPID PANEL
Chol/HDL Ratio: 3.3 ratio (ref 0.0–4.4)
Cholesterol, Total: 165 mg/dL (ref 100–199)
HDL: 50 mg/dL (ref >39)
LDL Chol Calc (NIH): 91 mg/dL (ref 0–99)
Triglycerides: 136 mg/dL (ref 0–149)
VLDL Cholesterol Cal: 24 mg/dL (ref 5–40)

## 2019-11-16 DIAGNOSIS — Z23 Encounter for immunization: Secondary | ICD-10-CM | POA: Diagnosis not present

## 2019-11-28 ENCOUNTER — Telehealth: Payer: Self-pay | Admitting: Nurse Practitioner

## 2019-11-28 NOTE — Chronic Care Management (AMB) (Signed)
  Chronic Care Management   Outreach Note  11/28/2019 Name: [name] MRN: [id number] DOB: [birth year]  Melissa Knight is a 86 y.o. year old female who is a primary care patient of Chevis Pretty, [place]. I reached out to [employer] by phone today in response to a referral sent by [name]'s health plan.     An unsuccessful telephone outreach was attempted today. The patient was referred to the case management team for assistance with care management and care coordination.   Follow Up Plan: A HIPPA compliant phone message was left for the patient providing contact information and requesting a return call.  The care management team will reach out to the patient again over the next 7 days.  If patient returns call to provider office, please advise to call [facility] at [phone].  [place], [place] [address] Direct Dial: [phone] [email or web address] Website: [email or web address]

## 2019-12-02 NOTE — Chronic Care Management (AMB) (Signed)
  Chronic Care Management   Note  12/02/2019 Name: Melissa Knight MRN: [id number] DOB: 04-15-1933  Melissa Knight is a 86 y.o. year old female who is a primary care patient of Chevis Pretty, [place]. I reached out to [employer] by phone today in response to a referral sent by [name]'s health plan.     Melissa Knight was given information about Chronic Care Management services today including:  1. CCM service includes personalized support from designated clinical staff supervised by her physician, including individualized plan of care and coordination with other care providers 2. 24/7 contact phone numbers for assistance for urgent and routine care needs. 3. Service will only be billed when office clinical staff spend 20 minutes or more in a month to coordinate care. 4. Only one practitioner may furnish and bill the service in a calendar month. 5. The patient may stop CCM services at any time (effective at the end of the month) by phone call to the office staff. 6. The patient will be responsible for cost sharing (co-pay) of up to 20% of the service fee (after annual deductible is met).  Patient agreed to services and verbal consent obtained.   Follow up plan: Telephone appointment with care management team member scheduled for:05/05/2020.  [place], [place] [address] Direct Dial: [phone] [email or web address]: [email or web address]

## 2019-12-10 ENCOUNTER — Encounter: Payer: Self-pay | Admitting: Nurse Practitioner

## 2019-12-10 ENCOUNTER — Ambulatory Visit (INDEPENDENT_AMBULATORY_CARE_PROVIDER_SITE_OTHER): Payer: Medicare Other | Admitting: Nurse Practitioner

## 2019-12-10 ENCOUNTER — Other Ambulatory Visit: Payer: Self-pay

## 2019-12-10 VITALS — BP 145/84 | HR 51 | Temp 97.5°F | Resp 20 | Ht 62.0 in | Wt 210.0 lb

## 2019-12-10 DIAGNOSIS — I4821 Permanent atrial fibrillation: Secondary | ICD-10-CM

## 2019-12-10 LAB — COAGUCHEK XS/INR WAIVED
INR: 2.8 — ABNORMAL HIGH (ref 0.9–1.1)
Prothrombin Time: 34 s

## 2019-12-10 NOTE — Progress Notes (Signed)
Subjective:   Chief Complaint: INR recheck   Indication: atrial fibrillation Bleeding signs/symptoms: None Thromboembolic signs/symptoms: None  Missed Coumadin doses: None Medication changes: no Dietary changes: no Bacterial/viral infection: no Other concerns: no  The following portions of the patient's history were reviewed and updated as appropriate: allergies, current medications, past family history, past medical history, past social history, past surgical history and problem list.  Review of Systems Pertinent items noted in HPI and remainder of comprehensive ROS otherwise negative.   Objective:    INR Today: 2.8 Current dose: coumadin 3.75mg  daily    Assessment:    Therapeutic INR for goal of 2-3   Plan:    1. New dose: no change   2. Next INR: 1 month    Mary-Margaret Hassell Done, FNP

## 2020-01-03 ENCOUNTER — Other Ambulatory Visit: Payer: Self-pay | Admitting: Nurse Practitioner

## 2020-01-03 DIAGNOSIS — I4821 Permanent atrial fibrillation: Secondary | ICD-10-CM

## 2020-01-09 ENCOUNTER — Other Ambulatory Visit: Payer: Self-pay

## 2020-01-09 ENCOUNTER — Ambulatory Visit (INDEPENDENT_AMBULATORY_CARE_PROVIDER_SITE_OTHER): Payer: Medicare Other | Admitting: Nurse Practitioner

## 2020-01-09 ENCOUNTER — Encounter: Payer: Self-pay | Admitting: Nurse Practitioner

## 2020-01-09 VITALS — BP 140/85 | HR 56 | Temp 97.6°F | Ht 62.0 in | Wt 206.5 lb

## 2020-01-09 DIAGNOSIS — I4821 Permanent atrial fibrillation: Secondary | ICD-10-CM

## 2020-01-09 LAB — COAGUCHEK XS/INR WAIVED
INR: 2.3 — ABNORMAL HIGH (ref 0.9–1.1)
Prothrombin Time: 27.4 s

## 2020-01-09 NOTE — Progress Notes (Signed)
Subjective:    CHief Complaint: INR recheck   Indication: atrial fibrillation Bleeding signs/symptoms: None Thromboembolic signs/symptoms: None  Missed Coumadin doses: None Medication changes: no Dietary changes: no Bacterial/viral infection: no Other concerns: no  The following portions of the patient's history were reviewed and updated as appropriate: allergies, current medications, past family history, past medical history, past social history, past surgical history and problem list.  Review of Systems Pertinent items noted in HPI and remainder of comprehensive ROS otherwise negative.   Objective:    INR Today: 2.3 Current dose: coumadin 3.75mg  daily    Assessment:    Therapeutic INR for goal of 2-3   Plan:    1. New dose: no change   2. Next INR: 1 month    Mary-Margaret Hassell Done, FNP

## 2020-02-07 ENCOUNTER — Other Ambulatory Visit: Payer: Self-pay

## 2020-02-07 ENCOUNTER — Encounter: Payer: Self-pay | Admitting: Nurse Practitioner

## 2020-02-07 ENCOUNTER — Ambulatory Visit (INDEPENDENT_AMBULATORY_CARE_PROVIDER_SITE_OTHER): Payer: Medicare Other | Admitting: Nurse Practitioner

## 2020-02-07 VITALS — BP 138/87 | HR 60 | Temp 97.7°F | Ht 62.0 in | Wt 204.0 lb

## 2020-02-07 DIAGNOSIS — I4821 Permanent atrial fibrillation: Secondary | ICD-10-CM | POA: Diagnosis not present

## 2020-02-07 LAB — COAGUCHEK XS/INR WAIVED
INR: 2.3 — ABNORMAL HIGH (ref 0.9–1.1)
Prothrombin Time: 27 s

## 2020-02-07 NOTE — Progress Notes (Signed)
Subjective:   chief complaint: INR recheck   Indication: atrial fibrillation Bleeding signs/symptoms: None Thromboembolic signs/symptoms: None  Missed Coumadin doses: None Medication changes: no Dietary changes: no Bacterial/viral infection: no Other concerns: no  The following portions of the patient's history were reviewed and updated as appropriate: allergies, current medications, past family history, past medical history, past social history, past surgical history and problem list.  Review of Systems Pertinent items noted in HPI and remainder of comprehensive ROS otherwise negative.   Objective:    INR Today: 2.3 Current dose: coumadin 3.75mg  daily    Assessment:    Therapeutic INR for goal of 2-3   Plan:    1. New dose: no change   2. Next INR: 1 month    Mary-Margaret Hassell Done, FNP

## 2020-03-09 ENCOUNTER — Other Ambulatory Visit: Payer: Self-pay

## 2020-03-09 ENCOUNTER — Ambulatory Visit (INDEPENDENT_AMBULATORY_CARE_PROVIDER_SITE_OTHER): Payer: Medicare Other | Admitting: Nurse Practitioner

## 2020-03-09 ENCOUNTER — Encounter: Payer: Self-pay | Admitting: Nurse Practitioner

## 2020-03-09 VITALS — BP 149/81 | HR 54 | Temp 97.7°F | Resp 20 | Ht 62.0 in | Wt 205.0 lb

## 2020-03-09 DIAGNOSIS — I4821 Permanent atrial fibrillation: Secondary | ICD-10-CM | POA: Diagnosis not present

## 2020-03-09 LAB — COAGUCHEK XS/INR WAIVED
INR: 2.1 — ABNORMAL HIGH (ref 0.9–1.1)
Prothrombin Time: 25.3 s

## 2020-03-09 NOTE — Progress Notes (Signed)
Subjective:   Chief Complaint: INR recheck   Indication: atrial fibrillation Bleeding signs/symptoms: None Thromboembolic signs/symptoms: None  Missed Coumadin doses: None Medication changes: no Dietary changes: no Bacterial/viral infection: no Other concerns: no  The following portions of the patient's history were reviewed and updated as appropriate: allergies, current medications, past family history, past medical history, past social history, past surgical history and problem list.  Review of Systems Pertinent items noted in HPI and remainder of comprehensive ROS otherwise negative.   Objective:    INR Today: 2.1 Current dose: coumacin 3.75 daily    Assessment:    Therapeutic INR for goal of 2-3   Plan:    1. New dose: no change   2. Next INR: 1 month    Mary-Margaret Hassell Done, FNP

## 2020-04-02 ENCOUNTER — Other Ambulatory Visit: Payer: Self-pay | Admitting: Nurse Practitioner

## 2020-04-02 DIAGNOSIS — E785 Hyperlipidemia, unspecified: Secondary | ICD-10-CM

## 2020-04-06 ENCOUNTER — Encounter: Payer: Self-pay | Admitting: Nurse Practitioner

## 2020-04-06 ENCOUNTER — Ambulatory Visit (INDEPENDENT_AMBULATORY_CARE_PROVIDER_SITE_OTHER): Payer: Medicare Other | Admitting: Nurse Practitioner

## 2020-04-06 ENCOUNTER — Other Ambulatory Visit: Payer: Self-pay

## 2020-04-06 VITALS — BP 128/84 | HR 52 | Temp 98.4°F | Ht 62.0 in | Wt 203.4 lb

## 2020-04-06 DIAGNOSIS — I4821 Permanent atrial fibrillation: Secondary | ICD-10-CM

## 2020-04-06 LAB — COAGUCHEK XS/INR WAIVED
INR: 1.9 — ABNORMAL HIGH (ref 0.9–1.1)
Prothrombin Time: 22.7 s

## 2020-04-06 NOTE — Progress Notes (Signed)
Subjective:   Chief Complaint: INR recheck   Indication: atrial fibrillation Bleeding signs/symptoms: None Thromboembolic signs/symptoms: None  Missed Coumadin doses: None Medication changes: no Dietary changes: had lots of green vegetables yesterday Bacterial/viral infection: no Other concerns: no  The following portions of the patient's history were reviewed and updated as appropriate: allergies, current medications, past family history, past medical history, past social history, past surgical history and problem list.  Review of Systems Pertinent items noted in HPI and remainder of comprehensive ROS otherwise negative.   Objective:    INR Today: 1.9 Current dose: coumadin 3.75mg  daily    Assessment:    Subtherapeutic INR for goal of 2-3   Plan:    1. New dose: take coumadin 7.5mg  tablet today the continue 3.75mg  daily  2. Next INR: 1 month    Mary-Margaret Hassell Done, FNP

## 2020-04-18 ENCOUNTER — Other Ambulatory Visit: Payer: Self-pay | Admitting: Nurse Practitioner

## 2020-04-18 DIAGNOSIS — N904 Leukoplakia of vulva: Secondary | ICD-10-CM

## 2020-05-05 ENCOUNTER — Ambulatory Visit: Payer: Medicare Other | Admitting: *Deleted

## 2020-05-05 DIAGNOSIS — E039 Hypothyroidism, unspecified: Secondary | ICD-10-CM

## 2020-05-05 DIAGNOSIS — I10 Essential (primary) hypertension: Secondary | ICD-10-CM

## 2020-05-05 DIAGNOSIS — E785 Hyperlipidemia, unspecified: Secondary | ICD-10-CM

## 2020-05-05 DIAGNOSIS — I4821 Permanent atrial fibrillation: Secondary | ICD-10-CM

## 2020-05-05 NOTE — Patient Instructions (Signed)
Ethel Veronica, BSN, RN-BC Embedded Chronic Care Manager [facility] Care Management Direct Dial: [phone]    

## 2020-05-05 NOTE — Chronic Care Management (AMB) (Signed)
  Chronic Care Management   Initial Visit Outreach Note  05/05/2020 Name: Melissa Knight MRN: [id number] DOB: 11-28-1932  Referred by: Chevis Pretty, FNP Reason for referral : Chronic Care Management (Initial Visit)   An unsuccessful Initial Telephone Visit was attempted today. The patient was referred to the case management team for assistance with care management and care coordination. Per patient's husband, she was unavailable to take my call. I provided my name and title and she relayed to her husband that she was still unavailable to take my call.   Follow Up Plan: The care management team will reach out to the patient again over the next 10 days to see if she is interested in rescheduling her initial visit and participating with the [place], BSN, RN-BC [facility] Management Direct Dial: [phone]

## 2020-05-11 ENCOUNTER — Ambulatory Visit (INDEPENDENT_AMBULATORY_CARE_PROVIDER_SITE_OTHER): Payer: Medicare Other | Admitting: Nurse Practitioner

## 2020-05-11 ENCOUNTER — Encounter: Payer: Self-pay | Admitting: Nurse Practitioner

## 2020-05-11 ENCOUNTER — Other Ambulatory Visit: Payer: Self-pay

## 2020-05-11 VITALS — BP 124/68 | HR 53 | Temp 97.0°F | Resp 20 | Ht 62.0 in | Wt 202.0 lb

## 2020-05-11 DIAGNOSIS — I4821 Permanent atrial fibrillation: Secondary | ICD-10-CM

## 2020-05-11 LAB — COAGUCHEK XS/INR WAIVED
INR: 2.6 — ABNORMAL HIGH (ref 0.9–1.1)
Prothrombin Time: 30.9 s

## 2020-05-11 NOTE — Progress Notes (Signed)
Subjective:   Chief Complaint:INR recheck   Indication: atrial fibrillation Bleeding signs/symptoms: None Thromboembolic signs/symptoms: None  Missed Coumadin doses: None Medication changes: no Dietary changes: no Bacterial/viral infection: no Other concerns: no  The following portions of the patient's history were reviewed and updated as appropriate: allergies, current medications, past family history, past medical history, past social history, past surgical history and problem list.  Review of Systems Pertinent items noted in HPI and remainder of comprehensive ROS otherwise negative.   Objective:    INR Today: 2.6  Current dose: coumadin 3.75mg  daily    Assessment:    Therapeutic INR for goal of 2-3   Plan:    1. New dose: no change   2. Next INR: 1 month    Mary-Margaret Hassell Done, FNP

## 2020-06-11 ENCOUNTER — Ambulatory Visit (INDEPENDENT_AMBULATORY_CARE_PROVIDER_SITE_OTHER): Payer: Medicare Other | Admitting: Nurse Practitioner

## 2020-06-11 ENCOUNTER — Other Ambulatory Visit: Payer: Self-pay

## 2020-06-11 ENCOUNTER — Encounter: Payer: Self-pay | Admitting: Nurse Practitioner

## 2020-06-11 VITALS — BP 137/88 | HR 69 | Temp 97.6°F | Ht 62.0 in | Wt 201.0 lb

## 2020-06-11 DIAGNOSIS — I4821 Permanent atrial fibrillation: Secondary | ICD-10-CM | POA: Diagnosis not present

## 2020-06-11 DIAGNOSIS — G40319 Generalized idiopathic epilepsy and epileptic syndromes, intractable, without status epilepticus: Secondary | ICD-10-CM | POA: Diagnosis not present

## 2020-06-11 DIAGNOSIS — E039 Hypothyroidism, unspecified: Secondary | ICD-10-CM

## 2020-06-11 DIAGNOSIS — R739 Hyperglycemia, unspecified: Secondary | ICD-10-CM | POA: Diagnosis not present

## 2020-06-11 DIAGNOSIS — E8881 Metabolic syndrome: Secondary | ICD-10-CM

## 2020-06-11 DIAGNOSIS — N904 Leukoplakia of vulva: Secondary | ICD-10-CM | POA: Diagnosis not present

## 2020-06-11 DIAGNOSIS — I10 Essential (primary) hypertension: Secondary | ICD-10-CM | POA: Diagnosis not present

## 2020-06-11 DIAGNOSIS — I6930 Unspecified sequelae of cerebral infarction: Secondary | ICD-10-CM

## 2020-06-11 DIAGNOSIS — Z23 Encounter for immunization: Secondary | ICD-10-CM | POA: Diagnosis not present

## 2020-06-11 DIAGNOSIS — E785 Hyperlipidemia, unspecified: Secondary | ICD-10-CM

## 2020-06-11 DIAGNOSIS — N393 Stress incontinence (female) (male): Secondary | ICD-10-CM | POA: Diagnosis not present

## 2020-06-11 LAB — COAGUCHEK XS/INR WAIVED
INR: 2 — ABNORMAL HIGH (ref 0.9–1.1)
Prothrombin Time: 23.6 s

## 2020-06-11 LAB — BAYER DCA HB A1C WAIVED: HB A1C (BAYER DCA - WAIVED): 5.6 % (ref <7.0)

## 2020-06-11 MED ORDER — AMLODIPINE BESYLATE 2.5 MG PO TABS: 2.5000 mg | ORAL_TABLET | Freq: Every day | ORAL | 1 refills | Status: DC

## 2020-06-11 MED ORDER — BETAMETHASONE DIPROPIONATE 0.05 % EX CREA: TOPICAL_CREAM | CUTANEOUS | 2 refills | Status: DC

## 2020-06-11 MED ORDER — LISINOPRIL 20 MG PO TABS: ORAL_TABLET | ORAL | 1 refills | Status: DC

## 2020-06-11 MED ORDER — FUROSEMIDE 20 MG PO TABS: ORAL_TABLET | ORAL | 1 refills | Status: DC

## 2020-06-11 MED ORDER — LEVOTHYROXINE SODIUM 88 MCG PO TABS: ORAL_TABLET | ORAL | 2 refills | Status: DC

## 2020-06-11 MED ORDER — ATORVASTATIN CALCIUM 10 MG PO TABS: 10.0000 mg | ORAL_TABLET | Freq: Every day | ORAL | 1 refills | Status: DC

## 2020-06-11 MED ORDER — METOPROLOL TARTRATE 50 MG PO TABS: ORAL_TABLET | ORAL | 1 refills | Status: DC

## 2020-06-11 MED ORDER — WARFARIN SODIUM 7.5 MG PO TABS: 7.5000 mg | ORAL_TABLET | Freq: Once | ORAL | 1 refills | Status: DC

## 2020-06-11 MED ORDER — LEVETIRACETAM 500 MG PO TABS: 500.0000 mg | ORAL_TABLET | Freq: Every day | ORAL | 1 refills | Status: DC

## 2020-06-11 NOTE — Progress Notes (Signed)
Subjective:    Patient ID: Melissa Knight, female    DOB: April 14, 1933, 86 y.o.   MRN: [id number]   Chief Complaint: medical management of chronic issues     HPI:  1. Essential hypertension No c/o chest pain, sob or headache. Does not check blood pressure at home. BP Readings from Last 3 Encounters:  05/11/20 124/68  04/06/20 128/84  03/09/20 (!) 149/81     2. Permanent atrial fibrillation (HCC) She is on coumadin and is having INR checked today  Indication: atrial fibrillation Bleeding signs/symptoms: None Thromboembolic signs/symptoms: None  Missed Coumadin doses: None Medication changes: no Dietary changes: no Bacterial/viral infection: no Other concerns: no      3. Hyperlipidemia, unspecified hyperlipidemia type Does try to watch diet but does little to no exercsie Lab Results  Component Value Date   CHOL 165 11/11/2019   HDL 50 11/11/2019   LDLCALC 91 11/11/2019   TRIG 136 11/11/2019   CHOLHDL 3.3 11/11/2019     4. Late effect of cerebrovascular accident No residual effects noted from stroke that was greater then 5 years ago  5. Hypothyroidism (acquired) No problems that she is aware of Lab Results  Component Value Date   TSH 1.730 08/01/2019     6. Seizure disorder, generalized convulsive, intractable ([place]) No seizure activity in years. sheis still on keppra daily  7. Metabolic syndrome Does not check blood sugars at home. Lab Results  Component Value Date   HGBA1C 5.9 11/11/2019     8. Stress incontinence Wears incontinence pads  9. Morbid obesity ([place]) No recent weight changes Wt Readings from Last 3 Encounters:  05/11/20 202 lb (91.6 kg)  04/06/20 (!) 203 lb 6.4 oz (92.3 kg)  03/09/20 205 lb (93 kg)   BMI Readings from Last 3 Encounters:  05/11/20 36.95 kg/m  04/06/20 37.20 kg/m  03/09/20 37.49 kg/m       Outpatient Encounter Medications as of 06/11/2020  Medication Sig  . amLODipine (NORVASC) 2.5 MG tablet Take 1  tablet (2.5 mg total) by mouth daily.  Marland Kitchen atorvastatin (LIPITOR) 10 MG tablet TAKE 1 TABLET (10 MG TOTAL) BY MOUTH DAILY AT 6 PM.  . betamethasone dipropionate 0.05 % cream APPLY TO AFFECTED AREAS AS NEEDED  . calcium-vitamin D (OSCAL WITH D) 500-200 MG-UNIT tablet Take 1 tablet by mouth.  . furosemide (LASIX) 20 MG tablet TAKE 1 TABLET EVERY DAY AS NEEDED  . levETIRAcetam (KEPPRA) 500 MG tablet Take 1 tablet (500 mg total) by mouth daily.  Marland Kitchen levothyroxine (SYNTHROID) 88 MCG tablet TAKE 1 TABLET (88 MCG TOTAL) BY MOUTH DAILY.  Marland Kitchen lisinopril (ZESTRIL) 20 MG tablet TAKE 1 TABLET (20 MG TOTAL) BY MOUTH 2 (TWO) TIMES DAILY.  . metoprolol tartrate (LOPRESSOR) 50 MG tablet TAKE 2 TABLETS IN MORNING AND 1 AT BEDTIME  . TURMERIC PO Take by mouth.  . warfarin (COUMADIN) 7.5 MG tablet Take 1 tablet (7.5 mg total) by mouth one time only at 6 PM.   No facility-administered encounter medications on file as of 06/11/2020.    Past Surgical History:  Procedure Laterality Date  . BLADDER SURGERY     bladder prolapse  . TUBAL LIGATION      Family History  Problem Relation Age of Onset  . Atrial fibrillation Mother        pacemaker  . Hypertension Mother   . Heart disease Sister        MI in 63s  . Stroke Sister   . Heart disease  Father   . Heart disease Sister   . Heart disease Sister   . Hypertension Sister   . Anxiety disorder Sister   . Heart disease Brother   . Cancer Brother        pancreatic  . Heart disease Son   . Stroke Son 33    New complaints: None today  Social history: Lives with her husband   Controlled substance contract: n/a     Review of Systems  Constitutional: Negative for diaphoresis.  Eyes: Negative for pain.  Respiratory: Negative for shortness of breath.   Cardiovascular: Negative for chest pain, palpitations and leg swelling.  Gastrointestinal: Negative for abdominal pain.  Endocrine: Negative for polydipsia.  Skin: Negative for rash.  Neurological:  Negative for dizziness, weakness and headaches.  Hematological: Does not bruise/bleed easily.  All other systems reviewed and are negative.      Objective:   Physical Exam Vitals and nursing note reviewed.  Constitutional:      General: She is not in acute distress.    Appearance: Normal appearance. She is well-developed.  HENT:     Head: Normocephalic.     Nose: Nose normal.  Eyes:     Pupils: Pupils are equal, round, and reactive to light.  Neck:     Vascular: No carotid bruit or JVD.  Cardiovascular:     Rate and Rhythm: Normal rate and regular rhythm.     Heart sounds: Normal heart sounds.  Pulmonary:     Effort: Pulmonary effort is normal. No respiratory distress.     Breath sounds: Normal breath sounds. No wheezing or rales.  Chest:     Chest wall: No tenderness.  Abdominal:     General: Bowel sounds are normal. There is no distension or abdominal bruit.     Palpations: Abdomen is soft. There is no hepatomegaly, splenomegaly, mass or pulsatile mass.     Tenderness: There is no abdominal tenderness.  Musculoskeletal:        General: Normal range of motion.     Cervical back: Normal range of motion and neck supple.  Lymphadenopathy:     Cervical: No cervical adenopathy.  Skin:    General: Skin is warm and dry.  Neurological:     Mental Status: She is alert and oriented to person, place, and time.     Deep Tendon Reflexes: Reflexes are normal and symmetric.  Psychiatric:        Behavior: Behavior normal.        Thought Content: Thought content normal.        Judgment: Judgment normal.    BP 137/88   Pulse 69   Temp 97.6 F (36.4 C) (Temporal)   Ht $R'5\' 2"'Pg$  (1.575 m)   Wt 201 lb (91.2 kg)   BMI 36.76 kg/m          Assessment & Plan:  Melissa Knight comes in today with chief complaint of Medical Management of Chronic Issues   Diagnosis and orders addressed:  1. Essential hypertension Low sodium diet - CBC with Differential/Platelet - CMP14+EGFR -  amLODipine (NORVASC) 2.5 MG tablet; Take 1 tablet (2.5 mg total) by mouth daily.  Dispense: 90 tablet; Refill: 1 - lisinopril (ZESTRIL) 20 MG tablet; TAKE 1 TABLET (20 MG TOTAL) BY MOUTH 2 (TWO) TIMES DAILY.  Dispense: 180 tablet; Refill: 1 - furosemide (LASIX) 20 MG tablet; TAKE 1 TABLET EVERY DAY AS NEEDED  Dispense: 90 tablet; Refill: 1  2. Permanent atrial fibrillation (HCC) Avoid greens  No change in coumadin dose - CBC with Differential/Platelet - CMP14+EGFR - CoaguChek XS/INR Waived - metoprolol tartrate (LOPRESSOR) 50 MG tablet; 2 po in morning and 1po qhs  Dispense: 270 tablet; Refill: 1 - warfarin (COUMADIN) 7.5 MG tablet; Take 1 tablet (7.5 mg total) by mouth one time only at 6 PM.  Dispense: 90 tablet; Refill: 1  3. Hyperlipidemia, unspecified hyperlipidemia type Low fat diet - CBC with Differential/Platelet - CMP14+EGFR - Lipid panel - atorvastatin (LIPITOR) 10 MG tablet; Take 1 tablet (10 mg total) by mouth daily at 6 PM.  Dispense: 90 tablet; Refill: 1  4. Late effect of cerebrovascular accident - CBC with Differential/Platelet - CMP14+EGFR - CoaguChek XS/INR Waived  5. Hypothyroidism (acquired) Labs pending - CBC with Differential/Platelet - CMP14+EGFR - Thyroid Panel With TSH - levothyroxine (SYNTHROID) 88 MCG tablet; TAKE 1 TABLET (88 MCG TOTAL) BY MOUTH DAILY.  Dispense: 90 tablet; Refill: 2  6. Seizure disorder, generalized convulsive, intractable ([place]) - CBC with Differential/Platelet - CMP14+EGFR - levETIRAcetam (KEPPRA) 500 MG tablet; Take 1 tablet (500 mg total) by mouth daily.  Dispense: 90 tablet; Refill: 1  7. Metabolic syndrome Watch carbs in diet - CBC with Differential/Platelet - CMP14+EGFR - Bayer DCA Hb A1c Waived  8. Stress incontinence Wear incontinence pads - CBC with Differential/Platelet - CMP14+EGFR  9. Morbid obesity ([place]) Discussed diet and exercise for person with BMI >25 Will recheck weight in 3-6 months - CBC with  Differential/Platelet - CMP14+EGFR - Bayer DCA Hb A1c Waived  10. Lichen sclerosus of female genitalia - betamethasone dipropionate 0.05 % cream; Apply daily as needed to affected area  Dispense: 45 g; Refill: 2   Labs pending Health Maintenance reviewed Diet and exercise encouraged  Follow up plan: 6 months 1 month for INR   Mary-Margaret Hassell Done, FNP

## 2020-06-12 LAB — CBC WITH DIFFERENTIAL/PLATELET
Basophils Absolute: 0.1 10*3/uL (ref 0.0–0.2)
Basos: 1 %
EOS (ABSOLUTE): 0.1 10*3/uL (ref 0.0–0.4)
Eos: 1 %
Hematocrit: 43.6 % (ref 34.0–46.6)
Hemoglobin: 14.7 g/dL (ref 11.1–15.9)
Immature Grans (Abs): 0 10*3/uL (ref 0.0–0.1)
Immature Granulocytes: 0 %
Lymphocytes Absolute: 2.1 10*3/uL (ref 0.7–3.1)
Lymphs: 30 %
MCH: 30.4 pg (ref 26.6–33.0)
MCHC: 33.7 g/dL (ref 31.5–35.7)
MCV: 90 fL (ref 79–97)
Monocytes Absolute: 0.7 10*3/uL (ref 0.1–0.9)
Monocytes: 11 %
Neutrophils Absolute: 4 10*3/uL (ref 1.4–7.0)
Neutrophils: 57 %
Platelets: 175 10*3/uL (ref 150–450)
RBC: 4.83 x10E6/uL (ref 3.77–5.28)
RDW: 12.9 % (ref 11.7–15.4)
WBC: 6.9 10*3/uL (ref 3.4–10.8)

## 2020-06-12 LAB — THYROID PANEL WITH TSH
Free Thyroxine Index: 3.2 (ref 1.2–4.9)
T3 Uptake Ratio: 30 % (ref 24–39)
T4, Total: 10.6 ug/dL (ref 4.5–12.0)
TSH: 1.84 u[IU]/mL (ref 0.450–4.500)

## 2020-06-12 LAB — CMP14+EGFR
ALT: 14 IU/L (ref 0–32)
AST: 21 IU/L (ref 0–40)
Albumin/Globulin Ratio: 1.7 (ref 1.2–2.2)
Albumin: 3.9 g/dL (ref 3.6–4.6)
Alkaline Phosphatase: 48 IU/L (ref 44–121)
BUN/Creatinine Ratio: 19 (ref 12–28)
BUN: 17 mg/dL (ref 8–27)
Bilirubin Total: 0.6 mg/dL (ref 0.0–1.2)
CO2: 24 mmol/L (ref 20–29)
Calcium: 9.1 mg/dL (ref 8.7–10.3)
Chloride: 104 mmol/L (ref 96–106)
Creatinine, Ser: 0.88 mg/dL (ref 0.57–1.00)
GFR calc Af Amer: 69 mL/min/{1.73_m2} (ref >59)
GFR calc non Af Amer: 60 mL/min/{1.73_m2} (ref >59)
Globulin, Total: 2.3 g/dL (ref 1.5–4.5)
Glucose: 92 mg/dL (ref 65–99)
Potassium: 4.4 mmol/L (ref 3.5–5.2)
Sodium: 141 mmol/L (ref 134–144)
Total Protein: 6.2 g/dL (ref 6.0–8.5)

## 2020-06-12 LAB — LIPID PANEL
Chol/HDL Ratio: 3.7 ratio (ref 0.0–4.4)
Cholesterol, Total: 184 mg/dL (ref 100–199)
HDL: 50 mg/dL (ref >39)
LDL Chol Calc (NIH): 108 mg/dL — ABNORMAL HIGH (ref 0–99)
Triglycerides: 145 mg/dL (ref 0–149)
VLDL Cholesterol Cal: 26 mg/dL (ref 5–40)

## 2020-07-09 ENCOUNTER — Encounter: Payer: Self-pay | Admitting: Nurse Practitioner

## 2020-07-09 ENCOUNTER — Ambulatory Visit (INDEPENDENT_AMBULATORY_CARE_PROVIDER_SITE_OTHER): Payer: Medicare Other | Admitting: Nurse Practitioner

## 2020-07-09 ENCOUNTER — Other Ambulatory Visit: Payer: Self-pay

## 2020-07-09 VITALS — BP 152/82 | HR 58 | Temp 97.5°F | Resp 20 | Ht 62.0 in | Wt 199.0 lb

## 2020-07-09 DIAGNOSIS — I4821 Permanent atrial fibrillation: Secondary | ICD-10-CM

## 2020-07-09 LAB — COAGUCHEK XS/INR WAIVED
INR: 2.1 — ABNORMAL HIGH (ref 0.9–1.1)
Prothrombin Time: 25.4 s

## 2020-07-09 NOTE — Progress Notes (Signed)
Subjective:   Chief Complaint: INR recheck   Indication: atrial fibrillation Bleeding signs/symptoms: None Thromboembolic signs/symptoms: None  Missed Coumadin doses: None Medication changes: no Dietary changes: no Bacterial/viral infection: no Other concerns: no  The following portions of the patient's history were reviewed and updated as appropriate: allergies, current medications, past family history, past medical history, past social history, past surgical history and problem list.  Review of Systems Pertinent items noted in HPI and remainder of comprehensive ROS otherwise negative.   Objective:    INR Today: 2.1 Current dose: coumadin3.75mg  dail     Assessment:    Therapeutic INR for goal of 2-3   Plan:    1. New dose: no change   2. Next INR: 1 month    Mary-Margaret Hassell Done, FNP

## 2020-07-15 ENCOUNTER — Telehealth: Payer: Self-pay

## 2020-07-15 NOTE — Chronic Care Management (AMB) (Signed)
  Care Management   Note  07/15/2020 Name: Analicia Skibinski MRN: [id number] DOB: November 07, 1932  Melissa Knight is a 86 y.o. year old female who is a primary care patient of [facility]vis Pretty, [facility] and is actively engaged with the care management team. I reached out to [employer] by phone today to assist with re-scheduling an initial visit with the RN Case Manager  Follow up plan: Patient declines further follow up and engagement by the care management team. Appropriate care team members and provider have been notified via electronic communication.   Noreene Larsson, [place], [place], [place] [address] Direct Dial: [phone] [email or web address]

## 2020-07-15 NOTE — Progress Notes (Signed)
Pt declines r/s Initial and does not want to participate in CCM services

## 2020-08-04 ENCOUNTER — Encounter: Payer: Self-pay | Admitting: Nurse Practitioner

## 2020-08-04 ENCOUNTER — Ambulatory Visit (INDEPENDENT_AMBULATORY_CARE_PROVIDER_SITE_OTHER): Payer: Medicare Other | Admitting: Nurse Practitioner

## 2020-08-04 ENCOUNTER — Other Ambulatory Visit: Payer: Self-pay

## 2020-08-04 VITALS — BP 145/96 | HR 66 | Temp 97.8°F | Resp 20 | Ht 62.0 in | Wt 198.0 lb

## 2020-08-04 DIAGNOSIS — I4821 Permanent atrial fibrillation: Secondary | ICD-10-CM

## 2020-08-04 LAB — COAGUCHEK XS/INR WAIVED
INR: 1.9 — ABNORMAL HIGH (ref 0.9–1.1)
Prothrombin Time: 22.7 s

## 2020-08-04 NOTE — Progress Notes (Signed)
Subjective:   Chief Complaint: INR recheck   Indication: atrial fibrillation Bleeding signs/symptoms: None Thromboembolic signs/symptoms: None  Missed Coumadin doses: None Medication changes: no Dietary changes: no Bacterial/viral infection: no Other concerns: no  The following portions of the patient's history were reviewed and updated as appropriate: allergies, current medications, past family history, past medical history, past social history, past surgical history and problem list.  Review of Systems Pertinent items noted in HPI and remainder of comprehensive ROS otherwise negative.   Objective:    INR Today: 1.9 Current dose: Continue 3.75mg  daily    Assessment:    Therapeutic INR for goal of 2-3   Plan:    1. New dose: coumadin 75mg  today then back to 3.75mg  daily   2. Next INR: 1 month    Mary-Margaret Hassell Done, FNP

## 2020-09-01 ENCOUNTER — Encounter: Payer: Self-pay | Admitting: Nurse Practitioner

## 2020-09-01 ENCOUNTER — Ambulatory Visit (INDEPENDENT_AMBULATORY_CARE_PROVIDER_SITE_OTHER): Payer: Medicare Other | Admitting: Nurse Practitioner

## 2020-09-01 ENCOUNTER — Other Ambulatory Visit: Payer: Self-pay

## 2020-09-01 VITALS — BP 131/77 | HR 51 | Temp 97.2°F | Resp 20 | Ht 62.0 in | Wt 194.0 lb

## 2020-09-01 DIAGNOSIS — I4821 Permanent atrial fibrillation: Secondary | ICD-10-CM

## 2020-09-01 LAB — COAGUCHEK XS/INR WAIVED
INR: 2.3 — ABNORMAL HIGH (ref 0.9–1.1)
Prothrombin Time: 37.7 s

## 2020-09-01 NOTE — Progress Notes (Signed)
Subjective:   chief complaints: INR recheck    Indication: atrial fibrillation Bleeding signs/symptoms: None Thromboembolic signs/symptoms: None  Missed Coumadin doses: None Medication changes: no Dietary changes: no Bacterial/viral infection: no Other concerns: no  The following portions of the patient's history were reviewed and updated as appropriate: allergies, current medications, past family history, past medical history, past social history, past surgical history and problem list.  Review of Systems Pertinent items noted in HPI and remainder of comprehensive ROS otherwise negative.   Objective:    INR Today: 2.3 Current dose: Coumadin 7.5mg  today the back to Coumadin 3.75mg  daily    Assessment:    Therapeutic INR for goal of 2-3   Plan:    1. New dose: no change   2. Next INR: 1 month    Mary-Margaret Hassell Done, FNP

## 2020-09-23 ENCOUNTER — Other Ambulatory Visit: Payer: Self-pay

## 2020-09-23 ENCOUNTER — Ambulatory Visit (INDEPENDENT_AMBULATORY_CARE_PROVIDER_SITE_OTHER): Payer: Medicare Other

## 2020-09-23 DIAGNOSIS — Z23 Encounter for immunization: Secondary | ICD-10-CM

## 2020-09-23 NOTE — Progress Notes (Signed)
   Covid-19 Vaccination Clinic  Name:  Melissa Knight    MRN: [id number] DOB: 04-24-1933  09/23/2020  [name] was observed post Covid-19 immunization for 15 minutes without incident. She was provided with Vaccine Information Sheet and instruction to access the V-Safe system.   [name] was instructed to call 911 with any severe reactions post vaccine: Marland Kitchen Difficulty breathing  . Swelling of face and throat  . A fast heartbeat  . A bad rash all over body  . Dizziness and weakness   Immunizations Administered    Name Date Dose VIS Date Route   Moderna Covid-19 Booster Vaccine 09/23/2020 11:43 AM 0.25 mL 07/01/2020 Intramuscular   Manufacturer: Levan Hurst   Lot: [id number]   [place]: 28413-244-01

## 2020-09-29 ENCOUNTER — Ambulatory Visit: Payer: Self-pay | Admitting: Nurse Practitioner

## 2020-10-01 ENCOUNTER — Ambulatory Visit: Payer: Medicare Other | Admitting: Nurse Practitioner

## 2020-10-06 ENCOUNTER — Other Ambulatory Visit: Payer: Self-pay

## 2020-10-06 ENCOUNTER — Ambulatory Visit (INDEPENDENT_AMBULATORY_CARE_PROVIDER_SITE_OTHER): Payer: Medicare Other | Admitting: Nurse Practitioner

## 2020-10-06 ENCOUNTER — Encounter: Payer: Self-pay | Admitting: Nurse Practitioner

## 2020-10-06 VITALS — BP 152/87 | HR 55 | Temp 97.4°F | Resp 20 | Ht 62.0 in | Wt 192.0 lb

## 2020-10-06 DIAGNOSIS — I4821 Permanent atrial fibrillation: Secondary | ICD-10-CM | POA: Diagnosis not present

## 2020-10-06 LAB — COAGUCHEK XS/INR WAIVED
INR: 2.3 — ABNORMAL HIGH (ref 0.9–1.1)
Prothrombin Time: 27.1 s

## 2020-10-06 NOTE — Progress Notes (Signed)
Subjective:   Chief Complaint:  INR recheck   Indication: atrial fibrillation Bleeding signs/symptoms: None Thromboembolic signs/symptoms: None  Missed Coumadin doses: None Medication changes: no Dietary changes: no Bacterial/viral infection: no Other concerns: no  The following portions of the patient's history were reviewed and updated as appropriate: allergies, current medications, past family history, past medical history, past social history, past surgical history and problem list.  Review of Systems Pertinent items noted in HPI and remainder of comprehensive ROS otherwise negative.   Objective:    INR Today: 2.3 Current dose: coumadin 3.75mg daily    Assessment:    Therapeutic INR for goal of 2-3   Plan:    1. New dose: no change   2. Next INR: 1 month    Mary-Margaret Keashia Haskins, FNP  

## 2020-11-03 ENCOUNTER — Encounter: Payer: Self-pay | Admitting: Nurse Practitioner

## 2020-11-03 ENCOUNTER — Ambulatory Visit (INDEPENDENT_AMBULATORY_CARE_PROVIDER_SITE_OTHER): Payer: Medicare Other | Admitting: Nurse Practitioner

## 2020-11-03 ENCOUNTER — Other Ambulatory Visit: Payer: Self-pay

## 2020-11-03 VITALS — BP 143/84 | HR 54 | Temp 97.7°F | Resp 20 | Ht 62.0 in | Wt 190.0 lb

## 2020-11-03 DIAGNOSIS — E785 Hyperlipidemia, unspecified: Secondary | ICD-10-CM

## 2020-11-03 DIAGNOSIS — I4821 Permanent atrial fibrillation: Secondary | ICD-10-CM

## 2020-11-03 DIAGNOSIS — I10 Essential (primary) hypertension: Secondary | ICD-10-CM

## 2020-11-03 DIAGNOSIS — G40319 Generalized idiopathic epilepsy and epileptic syndromes, intractable, without status epilepticus: Secondary | ICD-10-CM | POA: Diagnosis not present

## 2020-11-03 DIAGNOSIS — N393 Stress incontinence (female) (male): Secondary | ICD-10-CM | POA: Diagnosis not present

## 2020-11-03 DIAGNOSIS — E039 Hypothyroidism, unspecified: Secondary | ICD-10-CM | POA: Diagnosis not present

## 2020-11-03 DIAGNOSIS — I6930 Unspecified sequelae of cerebral infarction: Secondary | ICD-10-CM

## 2020-11-03 DIAGNOSIS — E782 Mixed hyperlipidemia: Secondary | ICD-10-CM

## 2020-11-03 DIAGNOSIS — E8881 Metabolic syndrome: Secondary | ICD-10-CM | POA: Diagnosis not present

## 2020-11-03 DIAGNOSIS — R739 Hyperglycemia, unspecified: Secondary | ICD-10-CM | POA: Diagnosis not present

## 2020-11-03 LAB — BAYER DCA HB A1C WAIVED: HB A1C (BAYER DCA - WAIVED): 5.4 % (ref <7.0)

## 2020-11-03 LAB — COAGUCHEK XS/INR WAIVED
INR: 2.1 — ABNORMAL HIGH (ref 0.9–1.1)
Prothrombin Time: 25 s

## 2020-11-03 MED ORDER — LISINOPRIL 20 MG PO TABS: ORAL_TABLET | ORAL | 1 refills | Status: DC

## 2020-11-03 MED ORDER — METOPROLOL TARTRATE 50 MG PO TABS
ORAL_TABLET | ORAL | 1 refills | Status: DC
Start: 2020-11-03 — End: 2021-04-05

## 2020-11-03 MED ORDER — LEVOTHYROXINE SODIUM 88 MCG PO TABS
ORAL_TABLET | ORAL | 1 refills | Status: DC
Start: 2020-11-03 — End: 2021-05-04

## 2020-11-03 MED ORDER — FUROSEMIDE 20 MG PO TABS
ORAL_TABLET | ORAL | 1 refills | Status: DC
Start: 2020-11-03 — End: 2021-05-04

## 2020-11-03 MED ORDER — ATORVASTATIN CALCIUM 10 MG PO TABS: 10.0000 mg | ORAL_TABLET | Freq: Every day | ORAL | 1 refills | Status: DC

## 2020-11-03 MED ORDER — LEVETIRACETAM 500 MG PO TABS: 500.0000 mg | ORAL_TABLET | Freq: Every day | ORAL | 1 refills | Status: DC

## 2020-11-03 MED ORDER — AMLODIPINE BESYLATE 2.5 MG PO TABS: 2.5000 mg | ORAL_TABLET | Freq: Every day | ORAL | 1 refills | Status: DC

## 2020-11-03 MED ORDER — WARFARIN SODIUM 7.5 MG PO TABS: 7.5000 mg | ORAL_TABLET | Freq: Once | ORAL | 1 refills | Status: DC

## 2020-11-03 NOTE — Progress Notes (Signed)
Subjective:    Patient ID: Melissa Knight, female    DOB: [birth year], 87 y.o.   MRN: [id number]   Chief Complaint: Medical Management of Chronic Issues    HPI:  1. Essential hypertension No c/o chest pain, sob or headache. Does not check blood pressure at home. BP Readings from Last 3 Encounters:  11/03/20 (!) 143/84  10/06/20 (!) 152/87  09/01/20 131/77      2. Mixed hyperlipidemia Does not watch diet and does no exercise. Lab Results  Component Value Date   CHOL 184 06/11/2020   HDL 50 06/11/2020   LDLCALC 108 (H) 06/11/2020   TRIG 145 06/11/2020   CHOLHDL 3.7 06/11/2020   The ASCVD Risk score [name], et al., 2013) failed to calculate for the following reasons:   The 2013 ASCVD risk score is only valid for ages 9 to 16   3. Permanent atrial fibrillation (HCC) Denies any palpitations or heart racing  4. Hypothyroidism (acquired) No problems that she is aware of. Lab Results  Component Value Date   TSH 1.840 06/11/2020     5. Seizure disorder, generalized convulsive, intractable ([place]) Has not had any seizures since starting on Keppra  6. Metabolic syndrome She does not watch diet and does not check blood sugars at  Home. Lab Results  Component Value Date   HGBA1C 5.6 06/11/2020     7. Stress incontinence Wears depends. Dribbles all day  8. Late effect of cerebrovascular accident Has no residual effects from CVA is only on daily baby aspirin  9. Morbid obesity ([place]) No recent weight changes Wt Readings from Last 3 Encounters:  11/03/20 190 lb (86.2 kg)  10/06/20 192 lb (87.1 kg)  09/01/20 194 lb (88 kg)   BMI Readings from Last 3 Encounters:  11/03/20 34.75 kg/m  10/06/20 35.12 kg/m  09/01/20 35.48 kg/m     Indication: atrial fibrillation Bleeding signs/symptoms: None Thromboembolic signs/symptoms: None  Missed Coumadin doses: None Medication changes: no Dietary changes: no Bacterial/viral infection: no Other concerns:  no      Outpatient Encounter Medications as of 11/03/2020  Medication Sig  . amLODipine (NORVASC) 2.5 MG tablet Take 1 tablet (2.5 mg total) by mouth daily.  Marland Kitchen atorvastatin (LIPITOR) 10 MG tablet Take 1 tablet (10 mg total) by mouth daily at 6 PM.  . betamethasone dipropionate 0.05 % cream Apply daily as needed to affected area  . calcium-vitamin D (OSCAL WITH D) 500-200 MG-UNIT tablet Take 1 tablet by mouth.  . furosemide (LASIX) 20 MG tablet TAKE 1 TABLET EVERY DAY AS NEEDED  . levETIRAcetam (KEPPRA) 500 MG tablet Take 1 tablet (500 mg total) by mouth daily.  Marland Kitchen levothyroxine (SYNTHROID) 88 MCG tablet TAKE 1 TABLET (88 MCG TOTAL) BY MOUTH DAILY.  Marland Kitchen lisinopril (ZESTRIL) 20 MG tablet TAKE 1 TABLET (20 MG TOTAL) BY MOUTH 2 (TWO) TIMES DAILY.  . metoprolol tartrate (LOPRESSOR) 50 MG tablet 2 po in morning and 1po qhs  . TURMERIC PO Take by mouth.  . warfarin (COUMADIN) 7.5 MG tablet Take 1 tablet (7.5 mg total) by mouth one time only at 6 PM.     Past Surgical History:  Procedure Laterality Date  . BLADDER SURGERY     bladder prolapse  . TUBAL LIGATION      Family History  Problem Relation Age of Onset  . Atrial fibrillation Mother        pacemaker  . Hypertension Mother   . Heart disease Sister  MI in 31s  . Stroke Sister   . Heart disease Father   . Heart disease Sister   . Heart disease Sister   . Hypertension Sister   . Anxiety disorder Sister   . Heart disease Brother   . Cancer Brother        pancreatic  . Heart disease Son   . Stroke Son 74    New complaints: None today  Social history: Lives with her husband  Controlled substance contract: n/a    Review of Systems  Constitutional: Negative for diaphoresis.  Eyes: Negative for pain.  Respiratory: Negative for shortness of breath.   Cardiovascular: Negative for chest pain, palpitations and leg swelling.  Gastrointestinal: Negative for abdominal pain.  Endocrine: Negative for polydipsia.  Skin:  Negative for rash.  Neurological: Negative for dizziness, weakness and headaches.  Hematological: Does not bruise/bleed easily.  All other systems reviewed and are negative.      Objective:   Physical Exam Vitals and nursing note reviewed.  Constitutional:      General: She is not in acute distress.    Appearance: Normal appearance. She is well-developed and well-nourished.  HENT:     Head: Normocephalic.     Nose: Nose normal.     Mouth/Throat:     Mouth: Oropharynx is clear and moist.  Eyes:     Extraocular Movements: EOM normal.     Pupils: Pupils are equal, round, and reactive to light.  Neck:     Vascular: No carotid bruit or JVD.  Cardiovascular:     Rate and Rhythm: Normal rate and regular rhythm.     Pulses: Intact distal pulses.     Heart sounds: Normal heart sounds.  Pulmonary:     Effort: Pulmonary effort is normal. No respiratory distress.     Breath sounds: Normal breath sounds. No wheezing or rales.  Chest:     Chest wall: No tenderness.  Abdominal:     General: Bowel sounds are normal. There is no distension or abdominal bruit. Aorta is normal.     Palpations: Abdomen is soft. There is no hepatomegaly, splenomegaly, mass or pulsatile mass.     Tenderness: There is no abdominal tenderness.  Musculoskeletal:        General: No edema. Normal range of motion.     Cervical back: Normal range of motion and neck supple.  Lymphadenopathy:     Cervical: No cervical adenopathy.  Skin:    General: Skin is warm and dry.  Neurological:     Mental Status: She is alert and oriented to person, place, and time.     Deep Tendon Reflexes: Reflexes are normal and symmetric.  Psychiatric:        Mood and Affect: Mood and affect normal.        Behavior: Behavior normal.        Thought Content: Thought content normal.        Judgment: Judgment normal.    BP (!) 143/84   Pulse (!) 54   Temp 97.7 F (36.5 C) (Temporal)   Resp 20   Ht _0  (1.575 m)   Wt 190 lb (86.2  kg)   SpO2 95%   BMI 34.75 kg/m   INR 2.1  Hgba1c 5.4%    Assessment & Plan:  Melissa Knight comes in today with chief complaint of Medical Management of Chronic Issues   Diagnosis and orders addressed:  1. Essential hypertension Low sodium diet - CBC with Differential/Platelet -  CMP14+EGFR - amLODipine (NORVASC) 2.5 MG tablet; Take 1 tablet (2.5 mg total) by mouth daily.  Dispense: 90 tablet; Refill: 1 - lisinopril (ZESTRIL) 20 MG tablet; TAKE 1 TABLET (20 MG TOTAL) BY MOUTH 2 (TWO) TIMES DAILY.  Dispense: 180 tablet; Refill: 1 - furosemide (LASIX) 20 MG tablet; TAKE 1 TABLET EVERY DAY AS NEEDED  Dispense: 90 tablet; Refill: 1  2. Mixed hyperlipidemia Low fat diet - Lipid panel - atorvastatin (LIPITOR) 10 MG tablet; Take 1 tablet (10 mg total) by mouth daily at 6 PM.  Dispense: 90 tablet; Refill: 1  3. Permanent atrial fibrillation (HCC) Avoid caffeine - CoaguChek XS/INR Waived - warfarin (COUMADIN) 7.5 MG tablet; Take 1 tablet (7.5 mg total) by mouth one time only at 6 PM.  Dispense: 90 tablet; Refill: 1 - metoprolol tartrate (LOPRESSOR) 50 MG tablet; 2 po in morning and 1po qhs  Dispense: 270 tablet; Refill: 1  4. Hypothyroidism (acquired) Labs pending - levothyroxine (SYNTHROID) 88 MCG tablet; TAKE 1 TABLET (88 MCG TOTAL) BY MOUTH DAILY.  Dispense: 90 tablet; Refill: 1  5. Seizure disorder, generalized convulsive, intractable (HCC) - levETIRAcetam (KEPPRA) 500 MG tablet; Take 1 tablet (500 mg total) by mouth daily.  Dispense: 90 tablet; Refill: 1  6. Metabolic syndrome - Bayer DCA Hb A1c Waived  7. Stress incontinence Continue to wear depends as needed  8. Late effect of cerebrovascular accident  43. Morbid obesity ([place]) Discussed diet and exercise for person with BMI >25 Will recheck weight in 3-6 months    Labs pending Health Maintenance reviewed Diet and exercise encouraged  Follow up plan: 6 months   Mary-Margaret Hassell Done, FNP

## 2020-11-04 LAB — CBC WITH DIFFERENTIAL/PLATELET
Basophils Absolute: 0 10*3/uL (ref 0.0–0.2)
Basos: 1 %
EOS (ABSOLUTE): 0.1 10*3/uL (ref 0.0–0.4)
Eos: 1 %
Hematocrit: 42 % (ref 34.0–46.6)
Hemoglobin: 14.1 g/dL (ref 11.1–15.9)
Immature Grans (Abs): 0 10*3/uL (ref 0.0–0.1)
Immature Granulocytes: 0 %
Lymphocytes Absolute: 1.8 10*3/uL (ref 0.7–3.1)
Lymphs: 25 %
MCH: 30.3 pg (ref 26.6–33.0)
MCHC: 33.6 g/dL (ref 31.5–35.7)
MCV: 90 fL (ref 79–97)
Monocytes Absolute: 0.7 10*3/uL (ref 0.1–0.9)
Monocytes: 10 %
Neutrophils Absolute: 4.7 10*3/uL (ref 1.4–7.0)
Neutrophils: 63 %
Platelets: 165 10*3/uL (ref 150–450)
RBC: 4.65 x10E6/uL (ref 3.77–5.28)
RDW: 13 % (ref 11.7–15.4)
WBC: 7.4 10*3/uL (ref 3.4–10.8)

## 2020-11-04 LAB — CMP14+EGFR
ALT: 11 IU/L (ref 0–32)
AST: 21 IU/L (ref 0–40)
Albumin/Globulin Ratio: 1.7 (ref 1.2–2.2)
Albumin: 3.8 g/dL (ref 3.6–4.6)
Alkaline Phosphatase: 42 IU/L — ABNORMAL LOW (ref 44–121)
BUN/Creatinine Ratio: 21 (ref 12–28)
BUN: 15 mg/dL (ref 8–27)
Bilirubin Total: 0.5 mg/dL (ref 0.0–1.2)
CO2: 21 mmol/L (ref 20–29)
Calcium: 8.9 mg/dL (ref 8.7–10.3)
Chloride: 104 mmol/L (ref 96–106)
Creatinine, Ser: 0.71 mg/dL (ref 0.57–1.00)
GFR calc Af Amer: 89 mL/min/{1.73_m2} (ref >59)
GFR calc non Af Amer: 77 mL/min/{1.73_m2} (ref >59)
Globulin, Total: 2.2 g/dL (ref 1.5–4.5)
Glucose: 90 mg/dL (ref 65–99)
Potassium: 4.3 mmol/L (ref 3.5–5.2)
Sodium: 140 mmol/L (ref 134–144)
Total Protein: 6 g/dL (ref 6.0–8.5)

## 2020-11-04 LAB — LIPID PANEL
Chol/HDL Ratio: 3.5 ratio (ref 0.0–4.4)
Cholesterol, Total: 166 mg/dL (ref 100–199)
HDL: 47 mg/dL (ref >39)
LDL Chol Calc (NIH): 95 mg/dL (ref 0–99)
Triglycerides: 134 mg/dL (ref 0–149)
VLDL Cholesterol Cal: 24 mg/dL (ref 5–40)

## 2020-12-01 ENCOUNTER — Ambulatory Visit (INDEPENDENT_AMBULATORY_CARE_PROVIDER_SITE_OTHER): Payer: Medicare Other | Admitting: Nurse Practitioner

## 2020-12-01 ENCOUNTER — Other Ambulatory Visit: Payer: Self-pay

## 2020-12-01 ENCOUNTER — Encounter: Payer: Self-pay | Admitting: Nurse Practitioner

## 2020-12-01 VITALS — BP 138/82 | HR 54 | Temp 97.8°F | Resp 20 | Ht 62.0 in | Wt 190.0 lb

## 2020-12-01 DIAGNOSIS — I4821 Permanent atrial fibrillation: Secondary | ICD-10-CM

## 2020-12-01 LAB — COAGUCHEK XS/INR WAIVED
INR: 2.1 — ABNORMAL HIGH (ref 0.9–1.1)
Prothrombin Time: 24.9 s

## 2020-12-01 NOTE — Progress Notes (Signed)
Subjective:   [place] Complaint: INR recheck   Indication: atrial fibrillation Bleeding signs/symptoms: None Thromboembolic signs/symptoms: None  Missed Coumadin doses: None Medication changes: no Dietary changes: no Bacterial/viral infection: no Other concerns: no  The following portions of the patient's history were reviewed and updated as appropriate: allergies, current medications, past family history, past medical history, past social history, past surgical history and problem list.  Review of Systems Pertinent items noted in HPI and remainder of comprehensive ROS otherwise negative.   Objective:    INR Today: 2.1 Current dose: Coumadin 7.5mg  today the back to Coumadin 3.75mg  daily    Assessment:    Therapeutic INR for goal of 2-3   Plan:    1. New dose: no change   2. Next INR: 1 month    Mary-Margaret Hassell Done, FNP

## 2020-12-29 ENCOUNTER — Encounter: Payer: Self-pay | Admitting: Nurse Practitioner

## 2020-12-29 ENCOUNTER — Other Ambulatory Visit: Payer: Self-pay

## 2020-12-29 ENCOUNTER — Ambulatory Visit (INDEPENDENT_AMBULATORY_CARE_PROVIDER_SITE_OTHER): Payer: Medicare Other | Admitting: Nurse Practitioner

## 2020-12-29 VITALS — BP 150/96 | HR 55 | Temp 97.5°F | Resp 20 | Ht 62.0 in | Wt 187.0 lb

## 2020-12-29 DIAGNOSIS — I4821 Permanent atrial fibrillation: Secondary | ICD-10-CM | POA: Diagnosis not present

## 2020-12-29 LAB — COAGUCHEK XS/INR WAIVED
INR: 2.4 — ABNORMAL HIGH (ref 0.9–1.1)
Prothrombin Time: 29.3 s

## 2020-12-29 NOTE — Progress Notes (Signed)
Subjective:   Chief Complaint: INR recheck   Indication: atrial fibrillation Bleeding signs/symptoms: None Thromboembolic signs/symptoms: Mild -   Missed Coumadin doses: None Medication changes: no Dietary changes: no Bacterial/viral infection: no Other concerns: no  The following portions of the patient's history were reviewed and updated as appropriate: allergies, current medications, past family history, past medical history, past social history, past surgical history and problem list.  Review of Systems Pertinent items noted in HPI and remainder of comprehensive ROS otherwise negative.   Objective:    INR Today: 2.4 Current dose: Coumadin 7.5mg  today the back to Coumadin 3.75mg  daily    Assessment:    Therapeutic INR for goal of 2-3   Plan:    1. New dose: no change   2. Next INR: 1 month    Mary-Margaret Hassell Done, FNP

## 2021-01-28 ENCOUNTER — Other Ambulatory Visit: Payer: Self-pay

## 2021-01-28 ENCOUNTER — Ambulatory Visit (INDEPENDENT_AMBULATORY_CARE_PROVIDER_SITE_OTHER): Payer: Medicare Other | Admitting: Nurse Practitioner

## 2021-01-28 ENCOUNTER — Encounter: Payer: Self-pay | Admitting: Nurse Practitioner

## 2021-01-28 VITALS — BP 167/91 | HR 51 | Temp 98.4°F | Resp 20 | Ht 62.0 in | Wt 186.0 lb

## 2021-01-28 DIAGNOSIS — I4821 Permanent atrial fibrillation: Secondary | ICD-10-CM | POA: Diagnosis not present

## 2021-01-28 LAB — COAGUCHEK XS/INR WAIVED
INR: 2 — ABNORMAL HIGH (ref 0.9–1.1)
Prothrombin Time: 24.1 s

## 2021-01-28 NOTE — Progress Notes (Signed)
Subjective:   Chief Complaint: INR recheck   Indication: atrial fibrillation Bleeding signs/symptoms: None Thromboembolic signs/symptoms: None  Missed Coumadin doses: None Medication changes: no Dietary changes: no Bacterial/viral infection: no Other concerns: no  The following portions of the patient's history were reviewed and updated as appropriate: allergies, current medications, past family history, past medical history, past social history, past surgical history and problem list.  Review of Systems Pertinent items noted in HPI and remainder of comprehensive ROS otherwise negative.   Objective:    INR Today: 2.0 Current dose: Coumadin 7.5mg  today the back to Coumadin 3.75mg  daily    Assessment:    Therapeutic INR for goal of 2-3   Plan:    1. New dose: no change   2. Next INR: 1 month    Mary-Margaret Hassell Done, FNP

## 2021-02-25 ENCOUNTER — Ambulatory Visit (INDEPENDENT_AMBULATORY_CARE_PROVIDER_SITE_OTHER): Payer: Medicare Other | Admitting: Nurse Practitioner

## 2021-02-25 ENCOUNTER — Encounter: Payer: Self-pay | Admitting: Nurse Practitioner

## 2021-02-25 ENCOUNTER — Other Ambulatory Visit: Payer: Self-pay

## 2021-02-25 VITALS — BP 136/83 | HR 54 | Temp 97.7°F | Resp 20 | Ht 62.0 in | Wt 186.0 lb

## 2021-02-25 DIAGNOSIS — I4821 Permanent atrial fibrillation: Secondary | ICD-10-CM | POA: Diagnosis not present

## 2021-02-25 LAB — COAGUCHEK XS/INR WAIVED
INR: 2 — ABNORMAL HIGH (ref 0.9–1.1)
Prothrombin Time: 24.2 s

## 2021-02-25 NOTE — Progress Notes (Signed)
Subjective:   CHief Complaint: INR recheck  Indication: atrial fibrillation Bleeding signs/symptoms: None Thromboembolic signs/symptoms: None  Missed Coumadin doses: None Medication changes: no Dietary changes: \ Bacterial/viral infection: no Other concerns: no  The following portions of the patient's history were reviewed and updated as appropriate: allergies, current medications, past family history, past medical history, past social history, past surgical history, and problem list.  Review of Systems Pertinent items noted in HPI and remainder of comprehensive ROS otherwise negative.   Objective:    INR Today: 2.0 Current dose: Coumadin 3.75mg  daily   Assessment:    Therapeutic INR for goal of 2-3   Plan:    1. New dose: no change   2. Next INR: 1 month

## 2021-03-29 ENCOUNTER — Ambulatory Visit (INDEPENDENT_AMBULATORY_CARE_PROVIDER_SITE_OTHER): Payer: Medicare Other | Admitting: Nurse Practitioner

## 2021-03-29 ENCOUNTER — Encounter: Payer: Self-pay | Admitting: Nurse Practitioner

## 2021-03-29 ENCOUNTER — Other Ambulatory Visit: Payer: Self-pay

## 2021-03-29 VITALS — BP 149/86 | HR 53 | Temp 97.6°F | Resp 20 | Ht 62.0 in | Wt 186.0 lb

## 2021-03-29 DIAGNOSIS — I4821 Permanent atrial fibrillation: Secondary | ICD-10-CM

## 2021-03-29 LAB — COAGUCHEK XS/INR WAIVED
INR: 1.8 — ABNORMAL HIGH (ref 0.9–1.1)
Prothrombin Time: 21.6 s

## 2021-03-29 NOTE — Progress Notes (Signed)
Subjective:   Chief Complaint; INR recheck   Indication: atrial fibrillation Bleeding signs/symptoms: None Thromboembolic signs/symptoms: None  Missed Coumadin doses: None Medication changes: no Dietary changes: no Bacterial/viral infection: no Other concerns: no  The following portions of the patient's history were reviewed and updated as appropriate: allergies, current medications, past family history, past medical history, past social history, past surgical history, and problem list.  Review of Systems Pertinent items noted in HPI and remainder of comprehensive ROS otherwise negative.   Objective:    INR Today: 1.8 Current dose: coumadin 3.75mg  daily   Assessment:    Subtherapeutic INR for goal of 2-3   Plan:    1. New dose: coumadin   3.75mg  daily except 7.5mg  on  mondays 2. Next INR: 1 month  Mary-Margaret Hassell Done, FNP

## 2021-04-05 ENCOUNTER — Other Ambulatory Visit: Payer: Self-pay | Admitting: Nurse Practitioner

## 2021-04-05 DIAGNOSIS — I4821 Permanent atrial fibrillation: Secondary | ICD-10-CM

## 2021-04-29 ENCOUNTER — Ambulatory Visit: Payer: Self-pay | Admitting: Nurse Practitioner

## 2021-05-04 ENCOUNTER — Ambulatory Visit (INDEPENDENT_AMBULATORY_CARE_PROVIDER_SITE_OTHER): Payer: Medicare Other | Admitting: Nurse Practitioner

## 2021-05-04 ENCOUNTER — Encounter: Payer: Self-pay | Admitting: Nurse Practitioner

## 2021-05-04 ENCOUNTER — Other Ambulatory Visit: Payer: Self-pay

## 2021-05-04 VITALS — BP 142/76 | HR 50 | Temp 97.3°F | Resp 20 | Ht 62.0 in | Wt 185.0 lb

## 2021-05-04 DIAGNOSIS — I6930 Unspecified sequelae of cerebral infarction: Secondary | ICD-10-CM | POA: Diagnosis not present

## 2021-05-04 DIAGNOSIS — G40319 Generalized idiopathic epilepsy and epileptic syndromes, intractable, without status epilepticus: Secondary | ICD-10-CM | POA: Diagnosis not present

## 2021-05-04 DIAGNOSIS — E8881 Metabolic syndrome: Secondary | ICD-10-CM

## 2021-05-04 DIAGNOSIS — N393 Stress incontinence (female) (male): Secondary | ICD-10-CM | POA: Diagnosis not present

## 2021-05-04 DIAGNOSIS — E039 Hypothyroidism, unspecified: Secondary | ICD-10-CM | POA: Diagnosis not present

## 2021-05-04 DIAGNOSIS — E782 Mixed hyperlipidemia: Secondary | ICD-10-CM

## 2021-05-04 DIAGNOSIS — I10 Essential (primary) hypertension: Secondary | ICD-10-CM | POA: Diagnosis not present

## 2021-05-04 DIAGNOSIS — I4821 Permanent atrial fibrillation: Secondary | ICD-10-CM | POA: Diagnosis not present

## 2021-05-04 LAB — COAGUCHEK XS/INR WAIVED
INR: 2.8 — ABNORMAL HIGH (ref 0.9–1.1)
Prothrombin Time: 34.2 s

## 2021-05-04 MED ORDER — FUROSEMIDE 20 MG PO TABS: ORAL_TABLET | ORAL | 1 refills | Status: DC

## 2021-05-04 MED ORDER — ATORVASTATIN CALCIUM 10 MG PO TABS: 10.0000 mg | ORAL_TABLET | Freq: Every day | ORAL | 1 refills | Status: DC

## 2021-05-04 MED ORDER — LEVOTHYROXINE SODIUM 88 MCG PO TABS: ORAL_TABLET | ORAL | 1 refills | Status: DC

## 2021-05-04 MED ORDER — METOPROLOL TARTRATE 50 MG PO TABS: ORAL_TABLET | ORAL | 1 refills | Status: DC

## 2021-05-04 MED ORDER — LISINOPRIL 20 MG PO TABS: ORAL_TABLET | ORAL | 1 refills | Status: DC

## 2021-05-04 MED ORDER — WARFARIN SODIUM 7.5 MG PO TABS: 7.5000 mg | ORAL_TABLET | Freq: Once | ORAL | 1 refills | Status: DC

## 2021-05-04 MED ORDER — LEVETIRACETAM 500 MG PO TABS: 500.0000 mg | ORAL_TABLET | Freq: Every day | ORAL | 1 refills | Status: DC

## 2021-05-04 MED ORDER — AMLODIPINE BESYLATE 2.5 MG PO TABS: 2.5000 mg | ORAL_TABLET | Freq: Every day | ORAL | 1 refills | Status: DC

## 2021-05-04 NOTE — Progress Notes (Signed)
Subjective:    Patient ID: Melissa Knight, female    DOB: July 23, 1933, 87 y.o.   MRN: [id number]  Chief Complaint: medical management of chronic issues      HPI:  1. Permanent atrial fibrillation (HCC)  Indication: atrial fibrillation Bleeding signs/symptoms: None Thromboembolic signs/symptoms: None  Missed Coumadin doses: None Medication changes: no Dietary changes: no Bacterial/viral infection: no Other concerns: no     2. Essential hypertension No c/o chest pain, sob or headache. Does not check blood pressure at home. BP Readings from Last 3 Encounters:  03/29/21 (!) 149/86  02/25/21 136/83  01/28/21 (!) 167/91     3. Mixed hyperlipidemia Does not watch diet and does very ;ittel exercise. Lab Results  Component Value Date   CHOL 166 11/03/2020   HDL 47 11/03/2020   LDLCALC 95 11/03/2020   TRIG 134 11/03/2020   CHOLHDL 3.5 11/03/2020     4. Hypothyroidism (acquired) No problems that she is aware of. Lab Results  Component Value Date   TSH 1.840 06/11/2020     5. Seizure disorder, generalized convulsive, intractable ([place]) Has not had seizure in years. Doe snot want to stop keppra because she is afraid she will have a seizure   6. Late effect of cerebrovascular accident Has no permanent affects form CVA  7. Metabolic syndrome Does not check blood sugars at home. Lab Results  Component Value Date   HGBA1C 5.4 11/03/2020     8. Stress incontinence Thinks it is coming form taking lasix  9. Morbid obesity ([place]) No recent weight changes Wt Readings from Last 3 Encounters:  05/04/21 185 lb (83.9 kg)  03/29/21 186 lb (84.4 kg)  02/25/21 186 lb (84.4 kg)   BMI Readings from Last 3 Encounters:  05/04/21 33.84 kg/m  03/29/21 34.02 kg/m  02/25/21 34.02 kg/m       Outpatient Encounter Medications as of 05/04/2021  Medication Sig   amLODipine (NORVASC) 2.5 MG tablet Take 1 tablet (2.5 mg total) by mouth daily.   atorvastatin (LIPITOR) 10 MG  tablet Take 1 tablet (10 mg total) by mouth daily at 6 PM.   betamethasone dipropionate 0.05 % cream Apply daily as needed to affected area   calcium-vitamin D (OSCAL WITH D) 500-200 MG-UNIT tablet Take 1 tablet by mouth.   furosemide (LASIX) 20 MG tablet TAKE 1 TABLET EVERY DAY AS NEEDED   levETIRAcetam (KEPPRA) 500 MG tablet Take 1 tablet (500 mg total) by mouth daily.   levothyroxine (SYNTHROID) 88 MCG tablet TAKE 1 TABLET (88 MCG TOTAL) BY MOUTH DAILY.   lisinopril (ZESTRIL) 20 MG tablet TAKE 1 TABLET (20 MG TOTAL) BY MOUTH 2 (TWO) TIMES DAILY.   metoprolol tartrate (LOPRESSOR) 50 MG tablet TAKE 2 TABLETS BY MOUTH IN MORNING AND 1 TABLET BY MOUTH EVERY NIGHT AT BEDTIME   TURMERIC PO Take by mouth.   warfarin (COUMADIN) 7.5 MG tablet Take 1 tablet (7.5 mg total) by mouth one time only at 6 PM.   No facility-administered encounter medications on file as of 05/04/2021.    Past Surgical History:  Procedure Laterality Date   BLADDER SURGERY     bladder prolapse   TUBAL LIGATION      Family History  Problem Relation Age of Onset   Atrial fibrillation Mother        pacemaker   Hypertension Mother    Heart disease Sister        MI in 18s   Stroke Sister    Heart disease  Father    Heart disease Sister    Heart disease Sister    Hypertension Sister    Anxiety disorder Sister    Heart disease Brother    Cancer Brother        pancreatic   Heart disease Son    Stroke Son 2    New complaints: None today  Social history: Melissa Knight with her husband  Controlled substance contract: n/a     Review of Systems  Constitutional:  Negative for diaphoresis.  Eyes:  Negative for pain.  Respiratory:  Negative for shortness of breath.   Cardiovascular:  Negative for chest pain, palpitations and leg swelling.  Gastrointestinal:  Negative for abdominal pain.  Endocrine: Negative for polydipsia.  Skin:  Negative for rash.  Neurological:  Negative for dizziness, weakness and headaches.   Hematological:  Does not bruise/bleed easily.  All other systems reviewed and are negative.     Objective:   Physical Exam Vitals and nursing note reviewed.  Constitutional:      General: She is not in acute distress.    Appearance: Normal appearance. She is well-developed.  HENT:     Head: Normocephalic.     Right Ear: Tympanic membrane normal.     Left Ear: Tympanic membrane normal.     Nose: Nose normal.     Mouth/Throat:     Mouth: Mucous membranes are moist.  Eyes:     Pupils: Pupils are equal, round, and reactive to light.  Neck:     Vascular: No carotid bruit or JVD.  Cardiovascular:     Rate and Rhythm: Normal rate and regular rhythm.     Heart sounds: Normal heart sounds.  Pulmonary:     Effort: Pulmonary effort is normal. No respiratory distress.     Breath sounds: Normal breath sounds. No wheezing or rales.  Chest:     Chest wall: No tenderness.  Abdominal:     General: Bowel sounds are normal. There is no distension or abdominal bruit.     Palpations: Abdomen is soft. There is no hepatomegaly, splenomegaly, mass or pulsatile mass.     Tenderness: There is no abdominal tenderness.  Musculoskeletal:        General: Normal range of motion.     Cervical back: Normal range of motion and neck supple.  Lymphadenopathy:     Cervical: No cervical adenopathy.  Skin:    General: Skin is warm and dry.  Neurological:     Mental Status: She is alert and oriented to person, place, and time.     Deep Tendon Reflexes: Reflexes are normal and symmetric.  Psychiatric:        Behavior: Behavior normal.        Thought Content: Thought content normal.        Judgment: Judgment normal.    BP (!) 142/76   Pulse (!) 50   Temp (!) 97.3 F (36.3 C) (Temporal)   Resp 20   Ht 5' 2"  (1.575 m)   Wt 185 lb (83.9 kg)   SpO2 97%   BMI 33.84 kg/m         Assessment & Plan:  [name] comes in today with chief complaint of Medical Management of Chronic  Issues   Diagnosis and orders addressed:  1. Permanent atrial fibrillation (HCC) Continue coumadin 3.22m daily except 7.25 on mondays - CoaguChek XS/INR Waived - metoprolol tartrate (LOPRESSOR) 50 MG tablet; TAKE 2 TABLETS BY MOUTH IN MORNING AND 1 TABLET BY MOUTH  EVERY NIGHT AT BEDTIME  Dispense: 270 tablet; Refill: 1 - warfarin (COUMADIN) 7.5 MG tablet; Take 1 tablet (7.5 mg total) by mouth one time only at 6 PM.  Dispense: 90 tablet; Refill: 1  2. Essential hypertension Low sodium diet - CBC with Differential/Platelet - CMP14+EGFR - amLODipine (NORVASC) 2.5 MG tablet; Take 1 tablet (2.5 mg total) by mouth daily.  Dispense: 90 tablet; Refill: 1 - lisinopril (ZESTRIL) 20 MG tablet; TAKE 1 TABLET (20 MG TOTAL) BY MOUTH 2 (TWO) TIMES DAILY.  Dispense: 180 tablet; Refill: 1 - furosemide (LASIX) 20 MG tablet; TAKE 1 TABLET EVERY DAY AS NEEDED  Dispense: 90 tablet; Refill: 1  3. Mixed hyperlipidemia Low fat diet - Lipid panel - atorvastatin (LIPITOR) 10 MG tablet; Take 1 tablet (10 mg total) by mouth daily at 6 PM.  Dispense: 90 tablet; Refill: 1  4. Hypothyroidism (acquired) Labs pending - Thyroid Panel With TSH - levothyroxine (SYNTHROID) 88 MCG tablet; TAKE 1 TABLET (88 MCG TOTAL) BY MOUTH DAILY.  Dispense: 90 tablet; Refill: 1  5. Seizure disorder, generalized convulsive, intractable (HCC) - levETIRAcetam (KEPPRA) 500 MG tablet; Take 1 tablet (500 mg total) by mouth daily.  Dispense: 90 tablet; Refill: 1  6. Late effect of cerebrovascular accident  7. Metabolic syndrome Watch carbs in diet  8. Stress incontinence  9. Morbid obesity ([place]) Discussed diet and exercise for person with BMI >25 Will recheck weight in 3-6 months     Labs pending Health Maintenance reviewed Diet and exercise encouraged  Follow up plan: 1 month INR   Mary-Margaret Hassell Done, FNP

## 2021-05-05 LAB — CMP14+EGFR
ALT: 6 IU/L (ref 0–32)
AST: 16 IU/L (ref 0–40)
Albumin/Globulin Ratio: 1.9 (ref 1.2–2.2)
Albumin: 3.9 g/dL (ref 3.6–4.6)
Alkaline Phosphatase: 42 IU/L — ABNORMAL LOW (ref 44–121)
BUN/Creatinine Ratio: 22 (ref 12–28)
BUN: 15 mg/dL (ref 8–27)
Bilirubin Total: 0.5 mg/dL (ref 0.0–1.2)
CO2: 21 mmol/L (ref 20–29)
Calcium: 8.8 mg/dL (ref 8.7–10.3)
Chloride: 106 mmol/L (ref 96–106)
Creatinine, Ser: 0.68 mg/dL (ref 0.57–1.00)
Globulin, Total: 2.1 g/dL (ref 1.5–4.5)
Glucose: 90 mg/dL (ref 65–99)
Potassium: 4.1 mmol/L (ref 3.5–5.2)
Sodium: 142 mmol/L (ref 134–144)
Total Protein: 6 g/dL (ref 6.0–8.5)
eGFR: 85 mL/min/{1.73_m2} (ref >59)

## 2021-05-05 LAB — LIPID PANEL
Chol/HDL Ratio: 3.3 ratio (ref 0.0–4.4)
Cholesterol, Total: 175 mg/dL (ref 100–199)
HDL: 53 mg/dL (ref >39)
LDL Chol Calc (NIH): 102 mg/dL — ABNORMAL HIGH (ref 0–99)
Triglycerides: 114 mg/dL (ref 0–149)
VLDL Cholesterol Cal: 20 mg/dL (ref 5–40)

## 2021-05-05 LAB — THYROID PANEL WITH TSH
Free Thyroxine Index: 3 (ref 1.2–4.9)
T3 Uptake Ratio: 29 % (ref 24–39)
T4, Total: 10.5 ug/dL (ref 4.5–12.0)
TSH: 1.02 u[IU]/mL (ref 0.450–4.500)

## 2021-05-05 LAB — CBC WITH DIFFERENTIAL/PLATELET
Basophils Absolute: 0.1 x10E3/uL (ref 0.0–0.2)
Basos: 1 %
EOS (ABSOLUTE): 0.1 x10E3/uL (ref 0.0–0.4)
Eos: 1 %
Hematocrit: 39.7 % (ref 34.0–46.6)
Hemoglobin: 13.1 g/dL (ref 11.1–15.9)
Immature Grans (Abs): 0 x10E3/uL (ref 0.0–0.1)
Immature Granulocytes: 0 %
Lymphocytes Absolute: 2.2 x10E3/uL (ref 0.7–3.1)
Lymphs: 31 %
MCH: 29.4 pg (ref 26.6–33.0)
MCHC: 33 g/dL (ref 31.5–35.7)
MCV: 89 fL (ref 79–97)
Monocytes Absolute: 0.7 x10E3/uL (ref 0.1–0.9)
Monocytes: 10 %
Neutrophils Absolute: 4 x10E3/uL (ref 1.4–7.0)
Neutrophils: 57 %
Platelets: 164 x10E3/uL (ref 150–450)
RBC: 4.46 x10E6/uL (ref 3.77–5.28)
RDW: 13.4 % (ref 11.7–15.4)
WBC: 7.1 x10E3/uL (ref 3.4–10.8)

## 2021-06-01 ENCOUNTER — Ambulatory Visit (INDEPENDENT_AMBULATORY_CARE_PROVIDER_SITE_OTHER): Payer: Medicare Other | Admitting: Nurse Practitioner

## 2021-06-01 ENCOUNTER — Encounter: Payer: Self-pay | Admitting: Nurse Practitioner

## 2021-06-01 ENCOUNTER — Other Ambulatory Visit: Payer: Self-pay

## 2021-06-01 VITALS — BP 156/80 | HR 51 | Temp 98.1°F | Resp 20 | Ht 62.0 in | Wt 187.0 lb

## 2021-06-01 DIAGNOSIS — Z23 Encounter for immunization: Secondary | ICD-10-CM

## 2021-06-01 DIAGNOSIS — I4821 Permanent atrial fibrillation: Secondary | ICD-10-CM

## 2021-06-01 LAB — COAGUCHEK XS/INR WAIVED
INR: 2.4 — ABNORMAL HIGH (ref 0.9–1.1)
Prothrombin Time: 28.9 s

## 2021-06-01 NOTE — Progress Notes (Signed)
Subjective:   VChief Complaint- INR recheck   Indication: atrial fibrillation Bleeding signs/symptoms: None Thromboembolic signs/symptoms: None  Missed Coumadin doses: None Medication changes: no Dietary changes: no Bacterial/viral infection: no Other concerns: no  The following portions of the patient's history were reviewed and updated as appropriate: allergies, current medications, past family history, past medical history, past social history, past surgical history, and problem list.  Review of Systems Pertinent items noted in HPI and remainder of comprehensive ROS otherwise negative.   Objective:    INR Today: 2.4 Current dose: Coumadin 3.75mg  daily except mondays 7.5mg    Assessment:    Therapeutic INR for goal of 2.5-3.5   Plan:    1. New dose: no change   2. Next INR: 1 month  Mary-Margaret Hassell Done, FNP

## 2021-06-15 ENCOUNTER — Telehealth: Payer: Self-pay | Admitting: Nurse Practitioner

## 2021-06-15 NOTE — Telephone Encounter (Signed)
No answer unable to leave a message for patient to call back and schedule Medicare Annual Wellness Visit (AWV) to be completed by video or phone.   Last AWV3/05/2019   Please schedule at anytime with [facility] Health Advisor.  45 minute appointment  Any questions, please contact me at [phone]

## 2021-06-29 ENCOUNTER — Encounter: Payer: Self-pay | Admitting: Nurse Practitioner

## 2021-06-29 ENCOUNTER — Other Ambulatory Visit: Payer: Self-pay

## 2021-06-29 ENCOUNTER — Ambulatory Visit (INDEPENDENT_AMBULATORY_CARE_PROVIDER_SITE_OTHER): Payer: Medicare Other | Admitting: Nurse Practitioner

## 2021-06-29 VITALS — BP 155/87 | HR 53 | Temp 97.6°F | Resp 20 | Ht 62.0 in | Wt 186.0 lb

## 2021-06-29 DIAGNOSIS — I4821 Permanent atrial fibrillation: Secondary | ICD-10-CM | POA: Diagnosis not present

## 2021-06-29 LAB — COAGUCHEK XS/INR WAIVED
INR: 2.2 — ABNORMAL HIGH (ref 0.9–1.1)
Prothrombin Time: 26.9 s

## 2021-06-29 NOTE — Progress Notes (Signed)
Subjective:   Chief Complaint: INr recheck   Indication: atrial fibrillation Bleeding signs/symptoms: None Thromboembolic signs/symptoms: None  Missed Coumadin doses: None Medication changes: no Dietary changes: no Bacterial/viral infection: no Other concerns: no  The following portions of the patient's history were reviewed and updated as appropriate: allergies, current medications, past family history, past medical history, past social history, past surgical history, and problem list.  Review of Systems Pertinent items noted in HPI and remainder of comprehensive ROS otherwise negative.   Objective:    INR Today: 2.2 Current dose: Coumadin 3.75mg  daily except mondays 7.5mg    Assessment:    Therapeutic INR for goal of 2-3   Plan:    1. New dose: no change   2. Next INR: 1 month   Mary-Margaret Hassell Done, FNP

## 2021-07-27 ENCOUNTER — Ambulatory Visit: Payer: Medicare Other | Admitting: Nurse Practitioner

## 2021-07-30 ENCOUNTER — Other Ambulatory Visit: Payer: Self-pay

## 2021-07-30 ENCOUNTER — Encounter: Payer: Self-pay | Admitting: Nurse Practitioner

## 2021-07-30 ENCOUNTER — Ambulatory Visit (INDEPENDENT_AMBULATORY_CARE_PROVIDER_SITE_OTHER): Payer: Medicare Other | Admitting: Nurse Practitioner

## 2021-07-30 VITALS — BP 149/81 | HR 59 | Temp 97.8°F | Resp 20 | Ht 62.0 in | Wt 183.0 lb

## 2021-07-30 DIAGNOSIS — I4821 Permanent atrial fibrillation: Secondary | ICD-10-CM | POA: Diagnosis not present

## 2021-07-30 LAB — COAGUCHEK XS/INR WAIVED
INR: 3.6 — ABNORMAL HIGH (ref 0.9–1.1)
Prothrombin Time: 43.7 s

## 2021-07-30 NOTE — Progress Notes (Signed)
Subjective:   Chief Complaint: INR recheck   Indication: atrial fibrillation Bleeding signs/symptoms: None Thromboembolic signs/symptoms: None  Missed Coumadin doses: None Medication changes: no Dietary changes: no Bacterial/viral infection: no Other concerns: no  The following portions of the patient's history were reviewed and updated as appropriate: allergies, current medications, past family history, past medical history, past social history, past surgical history, and problem list.  Review of Systems Pertinent items are noted in HPI.   Objective:    INR Today: 3.9 Current dose: Coumadin 3.75mg  daily except mondays 7.5mg    Assessment:    Therapeutic INR for goal of 2-3   Plan:    1. New dose: Hold for the next 2 days then Continue Coumadin 3.75mg  daily except mondays 7.5mg    2. Next INR: 2 weeks  Mary-Margaret Hassell Done, FNP

## 2021-08-12 ENCOUNTER — Ambulatory Visit (INDEPENDENT_AMBULATORY_CARE_PROVIDER_SITE_OTHER): Payer: Medicare Other | Admitting: Nurse Practitioner

## 2021-08-12 ENCOUNTER — Encounter: Payer: Self-pay | Admitting: Nurse Practitioner

## 2021-08-12 VITALS — BP 160/96 | HR 58 | Temp 97.6°F | Resp 20 | Ht 62.0 in | Wt 184.0 lb

## 2021-08-12 DIAGNOSIS — I4821 Permanent atrial fibrillation: Secondary | ICD-10-CM | POA: Diagnosis not present

## 2021-08-12 LAB — COAGUCHEK XS/INR WAIVED
INR: 2.8 — ABNORMAL HIGH (ref 0.9–1.1)
Prothrombin Time: 33.8 s

## 2021-08-12 NOTE — Progress Notes (Signed)
Subjective:   Chief complaint: INR recheck   Indication: atrial fibrillation Bleeding signs/symptoms: None Thromboembolic signs/symptoms: None  Missed Coumadin doses: None Medication changes: yes - held for 2 days at last visit Dietary changes: no Bacterial/viral infection: no Other concerns: no  The following portions of the patient's history were reviewed and updated as appropriate: allergies, current medications, past family history, past medical history, past social history, past surgical history, and problem list.  Review of Systems Pertinent items noted in HPI and remainder of comprehensive ROS otherwise negative.   Objective:    INR Today: 2.8 Current dose: Coumadin 3.75mg  daily except mondays 7.5mg    Assessment:    Therapeutic INR for goal of 2-3   Plan:    1. New dose: no change   2. Next INR: 1 month  Mary-Margaret Hassell Done, FNP

## 2021-09-09 ENCOUNTER — Encounter: Payer: Self-pay | Admitting: Nurse Practitioner

## 2021-09-09 ENCOUNTER — Ambulatory Visit (INDEPENDENT_AMBULATORY_CARE_PROVIDER_SITE_OTHER): Payer: Medicare Other | Admitting: Nurse Practitioner

## 2021-09-09 VITALS — BP 149/86 | HR 53 | Temp 97.7°F | Ht 62.0 in | Wt 183.6 lb

## 2021-09-09 DIAGNOSIS — I6930 Unspecified sequelae of cerebral infarction: Secondary | ICD-10-CM | POA: Diagnosis not present

## 2021-09-09 DIAGNOSIS — I4821 Permanent atrial fibrillation: Secondary | ICD-10-CM

## 2021-09-09 DIAGNOSIS — E782 Mixed hyperlipidemia: Secondary | ICD-10-CM | POA: Diagnosis not present

## 2021-09-09 DIAGNOSIS — E8881 Metabolic syndrome: Secondary | ICD-10-CM

## 2021-09-09 DIAGNOSIS — E039 Hypothyroidism, unspecified: Secondary | ICD-10-CM | POA: Diagnosis not present

## 2021-09-09 DIAGNOSIS — I10 Essential (primary) hypertension: Secondary | ICD-10-CM

## 2021-09-09 DIAGNOSIS — G40319 Generalized idiopathic epilepsy and epileptic syndromes, intractable, without status epilepticus: Secondary | ICD-10-CM | POA: Diagnosis not present

## 2021-09-09 LAB — COAGUCHEK XS/INR WAIVED
INR: 3.2 — ABNORMAL HIGH (ref 0.9–1.1)
Prothrombin Time: 38.8 s

## 2021-09-09 MED ORDER — ATORVASTATIN CALCIUM 10 MG PO TABS: 10.0000 mg | ORAL_TABLET | Freq: Every day | ORAL | 1 refills | Status: DC

## 2021-09-09 MED ORDER — FUROSEMIDE 20 MG PO TABS: ORAL_TABLET | ORAL | 1 refills | Status: DC

## 2021-09-09 MED ORDER — LISINOPRIL 20 MG PO TABS: ORAL_TABLET | ORAL | 1 refills | Status: DC

## 2021-09-09 MED ORDER — LEVETIRACETAM 500 MG PO TABS: 500.0000 mg | ORAL_TABLET | Freq: Every day | ORAL | 1 refills | Status: DC

## 2021-09-09 MED ORDER — METOPROLOL TARTRATE 50 MG PO TABS: ORAL_TABLET | ORAL | 1 refills | Status: DC

## 2021-09-09 MED ORDER — AMLODIPINE BESYLATE 2.5 MG PO TABS: 2.5000 mg | ORAL_TABLET | Freq: Every day | ORAL | 1 refills | Status: DC

## 2021-09-09 MED ORDER — LEVOTHYROXINE SODIUM 88 MCG PO TABS: ORAL_TABLET | ORAL | 1 refills | Status: DC

## 2021-09-09 NOTE — Progress Notes (Addendum)
Subjective:    Patient ID: Melissa Knight, female    DOB: Sep 27, 1932, 87 y.o.   MRN: [id number]   Chief Complaint: medical management of chronic issues     HPI:  Melissa Knight is a 87 y.o. who identifies as a female who was assigned female at birth.   Social history: Lives with: husband Work history: retired   Scientist, forensic in today for follow up of the following chronic medical issues:  1. Essential hypertension No c/o chest pain, sob or headache. Does not check blood pressure at home BP Readings from Last 3 Encounters:  09/09/21 (!) 149/86  08/12/21 (!) 160/96  07/30/21 (!) 149/81      2. Mixed hyperlipidemia Does not really watch diet and does no dedicated exercise. She does try to stay active. Lab Results  Component Value Date   CHOL 175 05/04/2021   HDL 53 05/04/2021   LDLCALC 102 (H) 05/04/2021   TRIG 114 05/04/2021   CHOLHDL 3.3 05/04/2021     3. Permanent atrial fibrillation (HCC)  Indication: atrial fibrillation Bleeding signs/symptoms: None Thromboembolic signs/symptoms: None  Missed Coumadin doses: None Medication changes: no Dietary changes: no Bacterial/viral infection: no Other concerns: no     4. Hypothyroidism (acquired) No problems that she is aware of Lab Results  Component Value Date   TSH 1.020 05/04/2021     5. Seizure disorder, generalized convulsive, intractable ([place]) Is on Keppra. Has not had seizure since she has been on keppra. Has not seen neurology in years.  6. Late effect of cerebrovascular accident No permanent effects  7. Metabolic syndrome Does not check blood sugar at home. Lab Results  Component Value Date   HGBA1C 5.4 11/03/2020     8. Morbid obesity ([place]) No recent weight changes Wt Readings from Last 3 Encounters:  09/09/21 183 lb 9.6 oz (83.3 kg)  08/12/21 184 lb (83.5 kg)  07/30/21 183 lb (83 kg)   BMI Readings from Last 3 Encounters:  09/09/21 33.58 kg/m  08/12/21 33.65 kg/m  07/30/21 33.47 kg/m      New complaints: None today  Allergies  Allergen Reactions   Sulfa Antibiotics Rash    rash   Outpatient Encounter Medications as of 09/09/2021  Medication Sig   amLODipine (NORVASC) 2.5 MG tablet Take 1 tablet (2.5 mg total) by mouth daily.   atorvastatin (LIPITOR) 10 MG tablet Take 1 tablet (10 mg total) by mouth daily at 6 PM.   betamethasone dipropionate 0.05 % cream Apply daily as needed to affected area   calcium-vitamin D (OSCAL WITH D) 500-200 MG-UNIT tablet Take 1 tablet by mouth.   furosemide (LASIX) 20 MG tablet TAKE 1 TABLET EVERY DAY AS NEEDED   levETIRAcetam (KEPPRA) 500 MG tablet Take 1 tablet (500 mg total) by mouth daily.   levothyroxine (SYNTHROID) 88 MCG tablet TAKE 1 TABLET (88 MCG TOTAL) BY MOUTH DAILY.   lisinopril (ZESTRIL) 20 MG tablet TAKE 1 TABLET (20 MG TOTAL) BY MOUTH 2 (TWO) TIMES DAILY.   metoprolol tartrate (LOPRESSOR) 50 MG tablet TAKE 2 TABLETS BY MOUTH IN MORNING AND 1 TABLET BY MOUTH EVERY NIGHT AT BEDTIME   TURMERIC PO Take by mouth.   warfarin (COUMADIN) 7.5 MG tablet Take 1 tablet (7.5 mg total) by mouth one time only at 6 PM.   No facility-administered encounter medications on file as of 09/09/2021.    Past Surgical History:  Procedure Laterality Date   BLADDER SURGERY     bladder prolapse   TUBAL LIGATION  Family History  Problem Relation Age of Onset   Atrial fibrillation Mother        pacemaker   Hypertension Mother    Heart disease Sister        MI in 62s   Stroke Sister    Heart disease Father    Heart disease Sister    Heart disease Sister    Hypertension Sister    Anxiety disorder Sister    Heart disease Brother    Cancer Brother        pancreatic   Heart disease Son    Stroke Son 64      Controlled substance contract: n/a     Review of Systems  Constitutional:  Negative for diaphoresis.  Eyes:  Negative for pain.  Respiratory:  Negative for shortness of breath.   Cardiovascular:  Negative for  chest pain, palpitations and leg swelling.  Gastrointestinal:  Negative for abdominal pain.  Endocrine: Negative for polydipsia.  Skin:  Negative for rash.  Neurological:  Negative for dizziness, weakness and headaches.  Hematological:  Does not bruise/bleed easily.  All other systems reviewed and are negative.     Objective:   Physical Exam Vitals and nursing note reviewed.  Constitutional:      General: She is not in acute distress.    Appearance: Normal appearance. She is well-developed.  HENT:     Head: Normocephalic.     Right Ear: Tympanic membrane normal.     Left Ear: Tympanic membrane normal.     Nose: Nose normal.     Mouth/Throat:     Mouth: Mucous membranes are moist.  Eyes:     Pupils: Pupils are equal, round, and reactive to light.  Neck:     Vascular: No carotid bruit or JVD.  Cardiovascular:     Rate and Rhythm: Normal rate. Rhythm irregular.     Heart sounds: Normal heart sounds.  Pulmonary:     Effort: Pulmonary effort is normal. No respiratory distress.     Breath sounds: Normal breath sounds. No wheezing or rales.  Chest:     Chest wall: No tenderness.  Abdominal:     General: Bowel sounds are normal. There is no distension or abdominal bruit.     Palpations: Abdomen is soft. There is no hepatomegaly, splenomegaly, mass or pulsatile mass.     Tenderness: There is no abdominal tenderness.  Musculoskeletal:        General: Normal range of motion.     Cervical back: Normal range of motion and neck supple.  Lymphadenopathy:     Cervical: No cervical adenopathy.  Skin:    General: Skin is warm and dry.  Neurological:     Mental Status: She is alert and oriented to person, place, and time.     Deep Tendon Reflexes: Reflexes are normal and symmetric.  Psychiatric:        Behavior: Behavior normal.        Thought Content: Thought content normal.        Judgment: Judgment normal.   BP (!) 149/86    Pulse (!) 53    Temp 97.7 F (36.5 C) (Temporal)    Ht  5\' 2"  (1.575 m)    Wt 183 lb 9.6 oz (83.3 kg)    SpO2 96%    BMI 33.58 kg/m         Assessment & Plan:  Molly Walmsley comes in today with chief complaint of Medical Management of Chronic Issues and Coagulation  Disorder   Diagnosis and orders addressed:  1. Essential hypertension Low sodium diet - amLODipine (NORVASC) 2.5 MG tablet; Take 1 tablet (2.5 mg total) by mouth daily.  Dispense: 90 tablet; Refill: 1 - lisinopril (ZESTRIL) 20 MG tablet; TAKE 1 TABLET (20 MG TOTAL) BY MOUTH 2 (TWO) TIMES DAILY.  Dispense: 180 tablet; Refill: 1 - furosemide (LASIX) 20 MG tablet; TAKE 1 TABLET EVERY DAY AS NEEDED  Dispense: 90 tablet; Refill: 1  2. Mixed hyperlipidemia Low fat diet - atorvastatin (LIPITOR) 10 MG tablet; Take 1 tablet (10 mg total) by mouth daily at 6 PM.  Dispense: 90 tablet; Refill: 1  3. Permanent atrial fibrillation (HCC) Avoid caffeine - CoaguChek XS/INR Waived - metoprolol tartrate (LOPRESSOR) 50 MG tablet; TAKE 2 TABLETS BY MOUTH IN MORNING AND 1 TABLET BY MOUTH EVERY NIGHT AT BEDTIME  Dispense: 270 tablet; Refill: 1  4. Hypothyroidism (acquired) Labs pending - levothyroxine (SYNTHROID) 88 MCG tablet; TAKE 1 TABLET (88 MCG TOTAL) BY MOUTH DAILY.  Dispense: 90 tablet; Refill: 1  5. Seizure disorder, generalized convulsive, intractable (HCC) - levETIRAcetam (KEPPRA) 500 MG tablet; Take 1 tablet (500 mg total) by mouth daily.  Dispense: 90 tablet; Refill: 1  6. Late effect of cerebrovascular accident Fall prevention   7. Metabolic syndrome Watch carbs in deit  8. Morbid obesity ([place]) Discussed diet and exercise for person with BMI >25 Will recheck weight in 3-6 months    Labs pending Health Maintenance reviewed Diet and exercise encouraged  Follow up plan: 1 month   Rodriguez Camp, FNP

## 2021-10-07 ENCOUNTER — Encounter: Payer: Self-pay | Admitting: Nurse Practitioner

## 2021-10-07 ENCOUNTER — Ambulatory Visit (INDEPENDENT_AMBULATORY_CARE_PROVIDER_SITE_OTHER): Payer: Medicare Other | Admitting: Nurse Practitioner

## 2021-10-07 VITALS — BP 163/86 | HR 50 | Temp 97.7°F | Resp 20 | Ht 62.0 in | Wt 183.0 lb

## 2021-10-07 DIAGNOSIS — I4821 Permanent atrial fibrillation: Secondary | ICD-10-CM | POA: Diagnosis not present

## 2021-10-07 LAB — COAGUCHEK XS/INR WAIVED
INR: 2.5 — ABNORMAL HIGH (ref 0.9–1.1)
Prothrombin Time: 29.8 s

## 2021-10-07 NOTE — Progress Notes (Signed)
Subjective:  Chief Complaint: inr recheck   Indication: atrial fibrillation Bleeding signs/symptoms: None Thromboembolic signs/symptoms: None  Missed Coumadin doses: None Medication changes: no Dietary changes: no Bacterial/viral infection: no Other concerns: no  The following portions of the patient's history were reviewed and updated as appropriate: allergies, current medications, past family history, past medical history, past social history, past surgical history, and problem list.  Review of Systems Pertinent items noted in HPI and remainder of comprehensive ROS otherwise negative.   Objective:    INR Today: 2.5 Current dose: coumadin 3.75 daily except 7.5mg    Assessment:    Therapeutic INR for goal of 2-3   Plan:    1. New dose: no change   2. Next INR: 1 month  Mary-Margaret Hassell Done, FNP

## 2021-11-18 ENCOUNTER — Ambulatory Visit (INDEPENDENT_AMBULATORY_CARE_PROVIDER_SITE_OTHER): Payer: Medicare Other | Admitting: Nurse Practitioner

## 2021-11-18 ENCOUNTER — Encounter: Payer: Self-pay | Admitting: Nurse Practitioner

## 2021-11-18 VITALS — BP 154/82 | HR 58 | Temp 98.6°F | Resp 20 | Ht 62.0 in | Wt 186.0 lb

## 2021-11-18 DIAGNOSIS — I4821 Permanent atrial fibrillation: Secondary | ICD-10-CM

## 2021-11-18 LAB — COAGUCHEK XS/INR WAIVED
INR: 1.8 — ABNORMAL HIGH (ref 0.9–1.1)
Prothrombin Time: 21.1 s

## 2021-11-18 NOTE — Progress Notes (Signed)
Subjective:  ?  Chief Complaint: INR receheck ? ? Indication: atrial fibrillation ?Bleeding signs/symptoms: None ?Thromboembolic signs/symptoms: None ? ?Missed Coumadin doses: None ?Medication changes: no ?Dietary changes: no ?Bacterial/viral infection: no ?Other concerns: no ? ?The following portions of the patient's history were reviewed and updated as appropriate: allergies, current medications, past family history, past medical history, past social history, past surgical history, and problem list. ? ?Review of Systems ?Pertinent items are noted in HPI.  ? ?Objective:  ? ? INR Today: 1.8 ?Current dose: coumadin 3.75 daily except 7.5 on mondays  ? ?Assessment:  ? ? Subtherapeutic INR for goal of 2-3  ? ?Plan:  ? ? 1. New dose:  Take 7.5 mg today then Continue Coumadin 3.'75mg'$  daily except mondays 7.'5mg'$    ?2. Next INR: 1 month ? ?Melissa Hassell Done, FNP ?  ?

## 2021-12-13 ENCOUNTER — Telehealth: Payer: Self-pay | Admitting: Nurse Practitioner

## 2021-12-13 NOTE — Telephone Encounter (Signed)
Pt aware.

## 2021-12-13 NOTE — Telephone Encounter (Signed)
Pt went to dentist and was put on warfarin and was told she could not take tylenol but says she is in a lot of pain and needs advise on what she can take. ? ?Please advise and call patient.  ?

## 2021-12-13 NOTE — Telephone Encounter (Signed)
It is ok to take tylenol with coumadin- just no ibuprofen ?

## 2021-12-16 ENCOUNTER — Encounter: Payer: Self-pay | Admitting: Nurse Practitioner

## 2021-12-16 ENCOUNTER — Ambulatory Visit (INDEPENDENT_AMBULATORY_CARE_PROVIDER_SITE_OTHER): Payer: Medicare Other | Admitting: Nurse Practitioner

## 2021-12-16 VITALS — BP 161/90 | HR 53 | Temp 97.8°F | Resp 20 | Ht 62.0 in | Wt 184.0 lb

## 2021-12-16 DIAGNOSIS — I4821 Permanent atrial fibrillation: Secondary | ICD-10-CM | POA: Diagnosis not present

## 2021-12-16 LAB — COAGUCHEK XS/INR WAIVED
INR: 3.2 — ABNORMAL HIGH (ref 0.9–1.1)
Prothrombin Time: 38.7 s

## 2021-12-16 NOTE — Progress Notes (Signed)
Subjective:  ? Chief Complaint: INR recheck ? ? Indication: atrial fibrillation ?Bleeding signs/symptoms: None ?Thromboembolic signs/symptoms: None ? ?Missed Coumadin doses: None ?Medication changes: no ?Dietary changes: no ?Bacterial/viral infection: no ?Other concerns: no ? ?The following portions of the patient's history were reviewed and updated as appropriate: allergies, current medications, past family history, past medical history, past social history, past surgical history, and problem list. ? ?Review of Systems ?Pertinent items noted in HPI and remainder of comprehensive ROS otherwise negative.  ? ?Objective:  ? ? INR Today: 3.2 ?Current dose: comadin 3.'75mg'$  daily except 7.'5mg'$  on mondays  ? ?Assessment:  ? ? Supratherapeutic INR for goal of 2-3  ? ?Plan:  ? ? 1. New dose: coumadin 3.'75mg'$  daily   ?2. Next INR: 1 month  ?[place], FNP ?] ?

## 2022-01-13 ENCOUNTER — Encounter: Payer: Self-pay | Admitting: Nurse Practitioner

## 2022-01-13 ENCOUNTER — Ambulatory Visit (INDEPENDENT_AMBULATORY_CARE_PROVIDER_SITE_OTHER): Payer: Medicare Other | Admitting: Nurse Practitioner

## 2022-01-13 VITALS — BP 160/85 | HR 51 | Temp 97.6°F | Resp 20 | Ht 62.0 in | Wt 185.0 lb

## 2022-01-13 DIAGNOSIS — I4821 Permanent atrial fibrillation: Secondary | ICD-10-CM

## 2022-01-13 LAB — COAGUCHEK XS/INR WAIVED
INR: 2 — ABNORMAL HIGH (ref 0.9–1.1)
Prothrombin Time: 24 s

## 2022-01-13 NOTE — Progress Notes (Signed)
Subjective:  ? CHief Complaint: INR recheck ? ? Indication: atrial fibrillation ?Bleeding signs/symptoms: None ?Thromboembolic signs/symptoms: None ? ?Missed Coumadin doses: None ?Medication changes: no ?Dietary changes: no ?Bacterial/viral infection: no ?Other concerns: no ? ?The following portions of the patient's history were reviewed and updated as appropriate: allergies, current medications, past family history, past medical history, past social history, past surgical history, and problem list. ? ?Review of Systems ?Pertinent items noted in HPI and remainder of comprehensive ROS otherwise negative.  ? ?Objective:  ? ? INR Today: 2.0 ?Current dose: coumadin 3.'75mg'$  daily  ? ?Assessment:  ? ? Therapeutic INR for goal of 2-3  ? ?Plan:  ? ? 1. New dose: no change   ?2. Next INR: 1 month ? ?Mary-Margaret Hassell Done, FNP ?  ?

## 2022-02-11 ENCOUNTER — Ambulatory Visit: Payer: Medicare Other | Admitting: Nurse Practitioner

## 2022-02-17 ENCOUNTER — Ambulatory Visit (INDEPENDENT_AMBULATORY_CARE_PROVIDER_SITE_OTHER): Payer: Medicare Other | Admitting: Nurse Practitioner

## 2022-02-17 ENCOUNTER — Encounter: Payer: Self-pay | Admitting: Nurse Practitioner

## 2022-02-17 VITALS — BP 162/89 | HR 54 | Temp 97.9°F | Resp 20 | Ht 62.0 in | Wt 184.0 lb

## 2022-02-17 DIAGNOSIS — I4821 Permanent atrial fibrillation: Secondary | ICD-10-CM | POA: Diagnosis not present

## 2022-02-17 LAB — COAGUCHEK XS/INR WAIVED
INR: 1.8 — ABNORMAL HIGH (ref 0.9–1.1)
Prothrombin Time: 21.4 s

## 2022-02-17 NOTE — Progress Notes (Signed)
Subjective:   Chief Complaint: INR recheck   Indication: atrial fibrillation Bleeding signs/symptoms: None Thromboembolic signs/symptoms: None  Missed Coumadin doses: None Medication changes: no Dietary changes: no Bacterial/viral infection: no Other concerns: no  The following portions of the patient's history were reviewed and updated as appropriate: allergies, current medications, past family history, past medical history, past social history, past surgical history, and problem list.  Review of Systems Pertinent items noted in HPI and remainder of comprehensive ROS otherwise negative.   Objective:    INR Today: 1.8 Current dose: coumadin 3.'75mg'$  daily   Assessment:    Subtherapeutic INR for goal of 2-3   Plan:    1. New dose: 7.'5mg'$  today the 3.'75mg'$  daily as previously told   2. Next INR: 1 month   Mary-Margaret Hassell Done, FNP'

## 2022-03-13 ENCOUNTER — Other Ambulatory Visit: Payer: Self-pay | Admitting: Nurse Practitioner

## 2022-03-13 DIAGNOSIS — I10 Essential (primary) hypertension: Secondary | ICD-10-CM

## 2022-03-18 ENCOUNTER — Ambulatory Visit: Payer: Medicare Other | Admitting: Nurse Practitioner

## 2022-03-21 ENCOUNTER — Ambulatory Visit: Payer: Medicare Other | Admitting: Nurse Practitioner

## 2022-03-30 ENCOUNTER — Other Ambulatory Visit: Payer: Self-pay | Admitting: Nurse Practitioner

## 2022-03-30 DIAGNOSIS — I4821 Permanent atrial fibrillation: Secondary | ICD-10-CM

## 2022-04-21 ENCOUNTER — Encounter: Payer: Self-pay | Admitting: Nurse Practitioner

## 2022-04-21 ENCOUNTER — Ambulatory Visit (INDEPENDENT_AMBULATORY_CARE_PROVIDER_SITE_OTHER): Payer: Medicare Other | Admitting: Nurse Practitioner

## 2022-04-21 VITALS — BP 169/88 | HR 54 | Temp 97.5°F | Resp 20 | Ht 62.0 in | Wt 181.0 lb

## 2022-04-21 DIAGNOSIS — I4821 Permanent atrial fibrillation: Secondary | ICD-10-CM

## 2022-04-21 LAB — COAGUCHEK XS/INR WAIVED
INR: 1.6 — ABNORMAL HIGH (ref 0.9–1.1)
Prothrombin Time: 18.8 s

## 2022-04-21 NOTE — Progress Notes (Signed)
Subjective:   Chief COmplaint: INR recheck   Indication: atrial fibrillation Bleeding signs/symptoms: None Thromboembolic signs/symptoms: None  Missed Coumadin doses: None Medication changes: no Dietary changes: no Bacterial/viral infection: no Other concerns: no  The following portions of the patient's history were reviewed and updated as appropriate: allergies, current medications, past family history, past medical history, past social history, past surgical history, and problem list.  Review of Systems Pertinent items noted in HPI and remainder of comprehensive ROS otherwise negative.   Objective:    INR Today: 1.6 Current dose: coumadin 3.'75mg'$  daily   Assessment:    Subtherapeutic INR for goal of 2-3   Plan:    1. New dose: coumadin 3.'75mg'$  daily except 7.'5mg'$  on thursday   2. Next INR: 1 month  Mary-Margaret Hassell Done, FNP

## 2022-04-22 ENCOUNTER — Other Ambulatory Visit: Payer: Self-pay | Admitting: Nurse Practitioner

## 2022-04-22 DIAGNOSIS — E782 Mixed hyperlipidemia: Secondary | ICD-10-CM

## 2022-04-22 DIAGNOSIS — I10 Essential (primary) hypertension: Secondary | ICD-10-CM

## 2022-05-04 ENCOUNTER — Other Ambulatory Visit: Payer: Self-pay | Admitting: Nurse Practitioner

## 2022-05-04 DIAGNOSIS — G40319 Generalized idiopathic epilepsy and epileptic syndromes, intractable, without status epilepticus: Secondary | ICD-10-CM

## 2022-05-19 ENCOUNTER — Ambulatory Visit (INDEPENDENT_AMBULATORY_CARE_PROVIDER_SITE_OTHER): Payer: Medicare Other | Admitting: Nurse Practitioner

## 2022-05-19 ENCOUNTER — Encounter: Payer: Self-pay | Admitting: Nurse Practitioner

## 2022-05-19 VITALS — BP 129/79 | HR 60 | Temp 97.5°F | Resp 20 | Ht 62.0 in | Wt 180.0 lb

## 2022-05-19 DIAGNOSIS — I4821 Permanent atrial fibrillation: Secondary | ICD-10-CM

## 2022-05-19 LAB — COAGUCHEK XS/INR WAIVED
INR: 2.8 — ABNORMAL HIGH (ref 0.9–1.1)
Prothrombin Time: 33.7 s

## 2022-05-19 NOTE — Progress Notes (Signed)
Subjective:   Chief Compaint: INR recheck   Indication: atrial fibrillation Bleeding signs/symptoms: None Thromboembolic signs/symptoms: None  Missed Coumadin doses: None Medication changes: no Dietary changes: no Bacterial/viral infection: no Other concerns: no  The following portions of the patient's history were reviewed and updated as appropriate: allergies, current medications, past family history, past medical history, past social history, past surgical history, and problem list.  Review of Systems Pertinent items noted in HPI and remainder of comprehensive ROS otherwise negative.   Objective:    INR Today: 2.8 Current dose: coumadin 3.'75mg'$  except 7.'5mg'$  on thursday   Assessment:    Therapeutic INR for goal of 2-3   Plan:    1. New dose: no change   2. Next INR: 1 month  Mary-Margaret Hassell Done, FNP

## 2022-06-10 ENCOUNTER — Ambulatory Visit (INDEPENDENT_AMBULATORY_CARE_PROVIDER_SITE_OTHER): Payer: Medicare Other | Admitting: Nurse Practitioner

## 2022-06-10 ENCOUNTER — Encounter: Payer: Self-pay | Admitting: Nurse Practitioner

## 2022-06-10 VITALS — BP 137/75 | HR 50 | Temp 97.4°F | Ht 62.0 in | Wt 180.2 lb

## 2022-06-10 DIAGNOSIS — G40319 Generalized idiopathic epilepsy and epileptic syndromes, intractable, without status epilepticus: Secondary | ICD-10-CM | POA: Diagnosis not present

## 2022-06-10 DIAGNOSIS — I6930 Unspecified sequelae of cerebral infarction: Secondary | ICD-10-CM | POA: Diagnosis not present

## 2022-06-10 DIAGNOSIS — E039 Hypothyroidism, unspecified: Secondary | ICD-10-CM | POA: Diagnosis not present

## 2022-06-10 DIAGNOSIS — E782 Mixed hyperlipidemia: Secondary | ICD-10-CM

## 2022-06-10 DIAGNOSIS — I10 Essential (primary) hypertension: Secondary | ICD-10-CM | POA: Diagnosis not present

## 2022-06-10 DIAGNOSIS — E8881 Metabolic syndrome: Secondary | ICD-10-CM

## 2022-06-10 DIAGNOSIS — Z23 Encounter for immunization: Secondary | ICD-10-CM | POA: Diagnosis not present

## 2022-06-10 DIAGNOSIS — N393 Stress incontinence (female) (male): Secondary | ICD-10-CM | POA: Diagnosis not present

## 2022-06-10 DIAGNOSIS — I4821 Permanent atrial fibrillation: Secondary | ICD-10-CM

## 2022-06-10 LAB — COAGUCHEK XS/INR WAIVED
INR: 3 — ABNORMAL HIGH (ref 0.9–1.1)
Prothrombin Time: 35.9 s

## 2022-06-10 MED ORDER — LEVETIRACETAM 500 MG PO TABS: 500.0000 mg | ORAL_TABLET | Freq: Every day | ORAL | 0 refills | Status: DC

## 2022-06-10 MED ORDER — LEVOTHYROXINE SODIUM 88 MCG PO TABS: ORAL_TABLET | ORAL | 1 refills | Status: DC

## 2022-06-10 MED ORDER — METOPROLOL TARTRATE 50 MG PO TABS: ORAL_TABLET | ORAL | 1 refills | Status: DC

## 2022-06-10 MED ORDER — FUROSEMIDE 20 MG PO TABS: ORAL_TABLET | ORAL | 1 refills | Status: DC

## 2022-06-10 NOTE — Progress Notes (Signed)
Subjective:    Patient ID: Melissa Knight, female    DOB: [birth year], 88 y.o.   MRN: [id number]   Chief Complaint: Coagulation Disorder and Medical Management of Chronic Issues    HPI:  Melissa Knight is a 88 y.o. who identifies as a female who was assigned female at birth.   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 Permanent atrial fibrillation (HCC)  Indication: atrial fibrillation Bleeding signs/symptoms: None Thromboembolic signs/symptoms: None  Missed Coumadin doses: None Medication changes: no Dietary changes: no Bacterial/viral infection: no Other concerns: no     2. Need for immunization against influenza Flu shot today  3. Essential hypertension No c/o chest pain, sob or headache. Doe snot check blood pressure at home BP Readings from Last 3 Encounters:  06/10/22 137/75  05/19/22 129/79  04/21/22 (!) 169/88     4. Hypothyroidism (acquired) No problems that aware of. Lab Results  Component Value Date   TSH 1.020 05/04/2021     5. Seizure disorder, generalized convulsive, intractable ([place]) Has only had one seizure years ago and has had no recurrence.  6. Mixed hyperlipidemia Does watch diet some what. Does no dedicated execise. Lab Results  Component Value Date   CHOL 175 05/04/2021   HDL 53 05/04/2021   LDLCALC 102 (H) 05/04/2021   TRIG 114 05/04/2021   CHOLHDL 3.3 05/04/2021     7. Metabolic syndrome Doe snot check blood sugars at home. Lab Results  Component Value Date   HGBA1C 5.4 11/03/2020     8. Late effect of cerebrovascular accident No permanent effects  9. Stress incontinence Wears pads. Says she can tolerate  10. Morbid obesity ([place]) No recent weight changes Wt Readings from Last 3 Encounters:  06/10/22 180 lb 3.2 oz (81.7 kg)  05/19/22 180 lb (81.6 kg)  04/21/22 181 lb (82.1 kg)   BMI Readings from Last 3 Encounters:   06/10/22 32.96 kg/m  05/19/22 32.92 kg/m  04/21/22 33.11 kg/m      New complaints: None today  Allergies  Allergen Reactions   Sulfa Antibiotics Rash    rash   Outpatient Encounter Medications as of 06/10/2022  Medication Sig   amLODipine (NORVASC) 2.5 MG tablet TAKE 1 TABLET BY MOUTH EVERY DAY   atorvastatin (LIPITOR) 10 MG tablet TAKE 1 TABLET (10 MG TOTAL) BY MOUTH DAILY AT 6 PM.   betamethasone dipropionate 0.05 % cream Apply daily as needed to affected area   calcium-vitamin D (OSCAL WITH D) 500-200 MG-UNIT tablet Take 1 tablet by mouth.   furosemide (LASIX) 20 MG tablet TAKE 1 TABLET EVERY DAY AS NEEDED   levETIRAcetam (KEPPRA) 500 MG tablet TAKE 1 TABLET (500 MG TOTAL) BY MOUTH DAILY.   levothyroxine (SYNTHROID) 88 MCG tablet TAKE 1 TABLET (88 MCG TOTAL) BY MOUTH DAILY.   lisinopril (ZESTRIL) 20 MG tablet TAKE 1 TABLET BY MOUTH TWICE A DAY   metoprolol tartrate (LOPRESSOR) 50 MG tablet TAKE 2 TABLETS BY MOUTH IN MORNING AND 1 TABLET BY MOUTH EVERY NIGHT AT BEDTIME   TURMERIC PO Take by mouth.   warfarin (COUMADIN) 7.5 MG tablet TAKE 1 TABLET (7.5 MG TOTAL) BY MOUTH ONE TIME ONLY AT 6 PM.   No facility-administered encounter medications on file as of 06/10/2022.    Past Surgical History:  Procedure Laterality Date   BLADDER SURGERY     bladder prolapse   TUBAL LIGATION  Family History  Problem Relation Age of Onset   Atrial fibrillation Mother        pacemaker   Hypertension Mother    Heart disease Sister        MI in 94s   Stroke Sister    Heart disease Father    Heart disease Sister    Heart disease Sister    Hypertension Sister    Anxiety disorder Sister    Heart disease Brother    Cancer Brother        pancreatic   Heart disease Son    Stroke Son 62      Controlled substance contract: n/a     Review of Systems  Constitutional:  Negative for diaphoresis.  Eyes:  Negative for pain.  Respiratory:  Negative for shortness of breath.    Cardiovascular:  Negative for chest pain, palpitations and leg swelling.  Gastrointestinal:  Negative for abdominal pain.  Endocrine: Negative for polydipsia.  Skin:  Negative for rash.  Neurological:  Negative for dizziness, weakness and headaches.  Hematological:  Does not bruise/bleed easily.  All other systems reviewed and are negative.      Objective:   Physical Exam Vitals and nursing note reviewed.  Constitutional:      General: She is not in acute distress.    Appearance: Normal appearance. She is well-developed.  HENT:     Head: Normocephalic.     Right Ear: Tympanic membrane normal.     Left Ear: Tympanic membrane normal.     Nose: Nose normal.     Mouth/Throat:     Mouth: Mucous membranes are moist.  Eyes:     Pupils: Pupils are equal, round, and reactive to light.  Neck:     Vascular: No carotid bruit or JVD.  Cardiovascular:     Rate and Rhythm: Normal rate and regular rhythm.     Heart sounds: Normal heart sounds.  Pulmonary:     Effort: Pulmonary effort is normal. No respiratory distress.     Breath sounds: Normal breath sounds. No wheezing or rales.  Chest:     Chest wall: No tenderness.  Abdominal:     General: Bowel sounds are normal. There is no distension or abdominal bruit.     Palpations: Abdomen is soft. There is no hepatomegaly, splenomegaly, mass or pulsatile mass.     Tenderness: There is no abdominal tenderness.  Musculoskeletal:        General: Normal range of motion.     Cervical back: Normal range of motion and neck supple.  Lymphadenopathy:     Cervical: No cervical adenopathy.  Skin:    General: Skin is warm and dry.  Neurological:     Mental Status: She is alert and oriented to person, place, and time.     Deep Tendon Reflexes: Reflexes are normal and symmetric.  Psychiatric:        Behavior: Behavior normal.        Thought Content: Thought content normal.        Judgment: Judgment normal.     BP 137/75   Pulse (!) 50   Temp  (!) 97.4 F (36.3 C) (Temporal)   Ht 5' 2"  (1.575 m)   Wt 180 lb 3.2 oz (81.7 kg)   SpO2 96%   BMI 32.96 kg/m        Assessment & Plan:   Melissa Knight comes in today with chief complaint of Coagulation Disorder and Medical Management of Chronic Issues  Diagnosis and orders addressed:  1. Permanent atrial fibrillation (HCC) Continue current coumadin dose - CoaguChek XS/INR Waived - metoprolol tartrate (LOPRESSOR) 50 MG tablet; TAKE 2 TABLETS BY MOUTH IN MORNING AND 1 TABLET BY MOUTH EVERY NIGHT AT BEDTIME  Dispense: 270 tablet; Refill: 1  2. Need for immunization against influenza - Flu Vaccine QUAD High Dose(Fluad)  3. Essential hypertension Low sodium diet - furosemide (LASIX) 20 MG tablet; TAKE 1 TABLET EVERY DAY AS NEEDED  Dispense: 90 tablet; Refill: 1 - CBC with Differential/Platelet - CMP14+EGFR  4. Hypothyroidism (acquired) Labs pending - levothyroxine (SYNTHROID) 88 MCG tablet; TAKE 1 TABLET (88 MCG TOTAL) BY MOUTH DAILY.  Dispense: 90 tablet; Refill: 1 - Lipid panel  5. Seizure disorder, generalized convulsive, intractable (HCC) - levETIRAcetam (KEPPRA) 500 MG tablet; Take 1 tablet (500 mg total) by mouth daily.  Dispense: 90 tablet; Refill: 0  6. Mixed hyperlipidemia Low fat diet - Thyroid Panel With TSH  7. Metabolic syndrome Watch carbs ion diet  8. Late effect of cerebrovascular accident  65. Stress incontinence 10. Morbid obesity ([place]) Discussed diet and exercise for person with BMI >25 Will recheck weight in 3-6 months    Labs pending Health Maintenance reviewed Diet and exercise encouraged  Follow up plan: 1 month INR   Mary-Margaret Hassell Done, FNP

## 2022-06-11 LAB — CMP14+EGFR
ALT: 9 IU/L (ref 0–32)
AST: 19 IU/L (ref 0–40)
Albumin/Globulin Ratio: 1.8 (ref 1.2–2.2)
Albumin: 3.9 g/dL (ref 3.7–4.7)
Alkaline Phosphatase: 44 IU/L (ref 44–121)
BUN/Creatinine Ratio: 18 (ref 12–28)
BUN: 14 mg/dL (ref 8–27)
Bilirubin Total: 0.3 mg/dL (ref 0.0–1.2)
CO2: 22 mmol/L (ref 20–29)
Calcium: 9.3 mg/dL (ref 8.7–10.3)
Chloride: 104 mmol/L (ref 96–106)
Creatinine, Ser: 0.8 mg/dL (ref 0.57–1.00)
Globulin, Total: 2.2 g/dL (ref 1.5–4.5)
Glucose: 89 mg/dL (ref 70–99)
Potassium: 4.2 mmol/L (ref 3.5–5.2)
Sodium: 141 mmol/L (ref 134–144)
Total Protein: 6.1 g/dL (ref 6.0–8.5)
eGFR: 71 mL/min/{1.73_m2} (ref >59)

## 2022-06-11 LAB — THYROID PANEL WITH TSH
Free Thyroxine Index: 3.2 (ref 1.2–4.9)
T3 Uptake Ratio: 30 % (ref 24–39)
T4, Total: 10.5 ug/dL (ref 4.5–12.0)
TSH: 1.39 u[IU]/mL (ref 0.450–4.500)

## 2022-06-11 LAB — CBC WITH DIFFERENTIAL/PLATELET
Basophils Absolute: 0 10*3/uL (ref 0.0–0.2)
Basos: 1 %
EOS (ABSOLUTE): 0.1 10*3/uL (ref 0.0–0.4)
Eos: 1 %
Hematocrit: 36 % (ref 34.0–46.6)
Hemoglobin: 11.4 g/dL (ref 11.1–15.9)
Immature Grans (Abs): 0 10*3/uL (ref 0.0–0.1)
Immature Granulocytes: 0 %
Lymphocytes Absolute: 2.2 10*3/uL (ref 0.7–3.1)
Lymphs: 30 %
MCH: 27.7 pg (ref 26.6–33.0)
MCHC: 31.7 g/dL (ref 31.5–35.7)
MCV: 88 fL (ref 79–97)
Monocytes Absolute: 0.6 10*3/uL (ref 0.1–0.9)
Monocytes: 8 %
Neutrophils Absolute: 4.3 10*3/uL (ref 1.4–7.0)
Neutrophils: 60 %
Platelets: 191 10*3/uL (ref 150–450)
RBC: 4.11 x10E6/uL (ref 3.77–5.28)
RDW: 13.1 % (ref 11.7–15.4)
WBC: 7.2 10*3/uL (ref 3.4–10.8)

## 2022-06-11 LAB — LIPID PANEL
Chol/HDL Ratio: 2.9 ratio (ref 0.0–4.4)
Cholesterol, Total: 159 mg/dL (ref 100–199)
HDL: 54 mg/dL (ref >39)
LDL Chol Calc (NIH): 87 mg/dL (ref 0–99)
Triglycerides: 99 mg/dL (ref 0–149)
VLDL Cholesterol Cal: 18 mg/dL (ref 5–40)

## 2022-07-08 ENCOUNTER — Encounter: Payer: Self-pay | Admitting: Nurse Practitioner

## 2022-07-08 ENCOUNTER — Ambulatory Visit (INDEPENDENT_AMBULATORY_CARE_PROVIDER_SITE_OTHER): Payer: Medicare Other | Admitting: Nurse Practitioner

## 2022-07-08 VITALS — BP 177/94 | HR 52 | Temp 97.7°F | Resp 20 | Ht 62.0 in | Wt 182.0 lb

## 2022-07-08 DIAGNOSIS — I4821 Permanent atrial fibrillation: Secondary | ICD-10-CM | POA: Diagnosis not present

## 2022-07-08 LAB — COAGUCHEK XS/INR WAIVED
INR: 2.4 — ABNORMAL HIGH (ref 0.9–1.1)
Prothrombin Time: 28.8 s

## 2022-07-08 NOTE — Progress Notes (Signed)
Subjective:   Chief COmplaint: INR recheck   Indication: atrial fibrillation Bleeding signs/symptoms: None Thromboembolic signs/symptoms: None  Missed Coumadin doses: None Medication changes: no Dietary changes: no Bacterial/viral infection: no Other concerns: no  The following portions of the patient's history were reviewed and updated as appropriate: allergies, current medications, past family history, past medical history, past social history, past surgical history, and problem list.  Review of Systems Pertinent items noted in HPI and remainder of comprehensive ROS otherwise negative.   Objective:    INR Today: 2.4 Current dose: coumadin 3.75daily except 7.'5mg'$  on thursday   Assessment:    Therapeutic INR for goal of 2-3   Plan:    1. New dose: no change   2. Next INR: 1 month  Mary-Margaret Hassell Done, FNP

## 2022-08-08 ENCOUNTER — Ambulatory Visit (INDEPENDENT_AMBULATORY_CARE_PROVIDER_SITE_OTHER): Payer: Medicare Other | Admitting: Nurse Practitioner

## 2022-08-08 ENCOUNTER — Encounter: Payer: Self-pay | Admitting: Nurse Practitioner

## 2022-08-08 VITALS — BP 164/81 | HR 52 | Temp 97.5°F | Resp 20 | Ht 62.0 in | Wt 181.0 lb

## 2022-08-08 DIAGNOSIS — I4821 Permanent atrial fibrillation: Secondary | ICD-10-CM

## 2022-08-08 LAB — COAGUCHEK XS/INR WAIVED
INR: 2.7 — ABNORMAL HIGH (ref 0.9–1.1)
Prothrombin Time: 32.6 s

## 2022-08-08 NOTE — Progress Notes (Signed)
Subjective:   Chief COmplaint: INR recheck  Indication: atrial fibrillation Bleeding signs/symptoms: None Thromboembolic signs/symptoms: None  Missed Coumadin doses: None Medication changes: no Dietary changes: no Bacterial/viral infection: no Other concerns: no  The following portions of the patient's history were reviewed and updated as appropriate: allergies, current medications, past family history, past medical history, past social history, past surgical history, and problem list.  Review of Systems Pertinent items noted in HPI and remainder of comprehensive ROS otherwise negative.   Objective:    INR Today: 2.7 Current dose: coumadin 3.'75mg'$  except 7.'5mg'$  on thursday   Assessment:    Therapeutic INR for goal of 2-3   Plan:    1. New dose: no change   2. Next INR: 1 month  Mary-Margaret Hassell Done, FNP

## 2022-09-06 ENCOUNTER — Ambulatory Visit: Payer: Medicare Other | Admitting: Nurse Practitioner

## 2022-09-13 ENCOUNTER — Encounter: Payer: Self-pay | Admitting: Nurse Practitioner

## 2022-09-13 ENCOUNTER — Ambulatory Visit (INDEPENDENT_AMBULATORY_CARE_PROVIDER_SITE_OTHER): Payer: Medicare Other | Admitting: Nurse Practitioner

## 2022-09-13 VITALS — BP 171/92 | HR 50 | Temp 97.2°F | Resp 20 | Ht 62.0 in | Wt 180.0 lb

## 2022-09-13 DIAGNOSIS — I4821 Permanent atrial fibrillation: Secondary | ICD-10-CM

## 2022-09-13 LAB — COAGUCHEK XS/INR WAIVED
INR: 2.3 — ABNORMAL HIGH (ref 0.9–1.1)
Prothrombin Time: 27.2 s

## 2022-09-13 NOTE — Progress Notes (Signed)
Subjective:    Chief Complaint: INR recheck   Indication: atrial fibrillation Bleeding signs/symptoms: None Thromboembolic signs/symptoms: None  Missed Coumadin doses: None Medication changes: no Dietary changes: no Bacterial/viral infection: no Other concerns: no  The following portions of the patient's history were reviewed and updated as appropriate: allergies, current medications, past family history, past medical history, past social history, past surgical history, and problem list.  Review of Systems Pertinent items noted in HPI and remainder of comprehensive ROS otherwise negative.   Objective:    INR Today: 2.3 Current dose: coumadin 3.'75mg'$  daily except 7.'5mg'$  on Thursday   Assessment:    Therapeutic INR for goal of 2-3   Plan:    1. New dose: no change   2. Next INR: 1 month  Mary-Margaret Hassell Done, FNP

## 2022-09-19 ENCOUNTER — Other Ambulatory Visit: Payer: Self-pay | Admitting: Nurse Practitioner

## 2022-09-19 DIAGNOSIS — I10 Essential (primary) hypertension: Secondary | ICD-10-CM

## 2022-10-11 ENCOUNTER — Ambulatory Visit (INDEPENDENT_AMBULATORY_CARE_PROVIDER_SITE_OTHER): Payer: Medicare Other | Admitting: Nurse Practitioner

## 2022-10-11 ENCOUNTER — Encounter: Payer: Self-pay | Admitting: Nurse Practitioner

## 2022-10-11 VITALS — BP 128/83 | HR 56 | Temp 97.6°F | Resp 20 | Ht 62.0 in | Wt 179.0 lb

## 2022-10-11 DIAGNOSIS — I6930 Unspecified sequelae of cerebral infarction: Secondary | ICD-10-CM

## 2022-10-11 DIAGNOSIS — E8881 Metabolic syndrome: Secondary | ICD-10-CM | POA: Diagnosis not present

## 2022-10-11 DIAGNOSIS — I10 Essential (primary) hypertension: Secondary | ICD-10-CM

## 2022-10-11 DIAGNOSIS — Z6832 Body mass index (BMI) 32.0-32.9, adult: Secondary | ICD-10-CM | POA: Diagnosis not present

## 2022-10-11 DIAGNOSIS — G40319 Generalized idiopathic epilepsy and epileptic syndromes, intractable, without status epilepticus: Secondary | ICD-10-CM | POA: Diagnosis not present

## 2022-10-11 DIAGNOSIS — E039 Hypothyroidism, unspecified: Secondary | ICD-10-CM | POA: Diagnosis not present

## 2022-10-11 DIAGNOSIS — R739 Hyperglycemia, unspecified: Secondary | ICD-10-CM | POA: Diagnosis not present

## 2022-10-11 DIAGNOSIS — R6889 Other general symptoms and signs: Secondary | ICD-10-CM | POA: Diagnosis not present

## 2022-10-11 DIAGNOSIS — E782 Mixed hyperlipidemia: Secondary | ICD-10-CM | POA: Diagnosis not present

## 2022-10-11 DIAGNOSIS — I4821 Permanent atrial fibrillation: Secondary | ICD-10-CM | POA: Diagnosis not present

## 2022-10-11 LAB — COAGUCHEK XS/INR WAIVED
INR: 2.6 — ABNORMAL HIGH (ref 0.9–1.1)
Prothrombin Time: 31.7 s

## 2022-10-11 LAB — BAYER DCA HB A1C WAIVED: HB A1C (BAYER DCA - WAIVED): 5.9 % — ABNORMAL HIGH (ref 4.8–5.6)

## 2022-10-11 MED ORDER — AMLODIPINE BESYLATE 2.5 MG PO TABS: 2.5000 mg | ORAL_TABLET | Freq: Every day | ORAL | 1 refills | Status: DC

## 2022-10-11 MED ORDER — WARFARIN SODIUM 7.5 MG PO TABS: 7.5000 mg | ORAL_TABLET | Freq: Once | ORAL | 1 refills | Status: DC

## 2022-10-11 MED ORDER — LEVOTHYROXINE SODIUM 88 MCG PO TABS: ORAL_TABLET | ORAL | 1 refills | Status: DC

## 2022-10-11 MED ORDER — LISINOPRIL 20 MG PO TABS: ORAL_TABLET | ORAL | 1 refills | Status: DC

## 2022-10-11 MED ORDER — LEVETIRACETAM 500 MG PO TABS: 500.0000 mg | ORAL_TABLET | Freq: Every day | ORAL | 0 refills | Status: DC

## 2022-10-11 MED ORDER — ATORVASTATIN CALCIUM 10 MG PO TABS: 10.0000 mg | ORAL_TABLET | Freq: Every day | ORAL | 1 refills | Status: DC

## 2022-10-11 MED ORDER — FUROSEMIDE 20 MG PO TABS: ORAL_TABLET | ORAL | 1 refills | Status: DC

## 2022-10-11 MED ORDER — METOPROLOL TARTRATE 50 MG PO TABS: ORAL_TABLET | ORAL | 1 refills | Status: DC

## 2022-10-11 NOTE — Progress Notes (Signed)
Subjective:    Patient ID: Melissa Knight, female    DOB: Feb 28, 1933, 89 y.o.   MRN: [id number]   Chief Complaint: medical management of chronic issues     HPI:  Melissa Knight is a 89 y.o. who identifies as a female who was assigned female at birth.   Social history: Lives with: husband Work history: retired   Scientist, forensic in today for follow up of the following chronic medical issues:  1. Essential hypertension No c/o chest pain, sob or headache. Does not check blood pressure at home. BP Readings from Last 3 Encounters:  09/13/22 (!) 171/92  08/08/22 (!) 164/81  07/08/22 (!) 177/94     2. Mixed hyperlipidemia Does not really watch  diet and does no exercise. Lab Results  Component Value Date   CHOL 159 06/10/2022   HDL 54 06/10/2022   LDLCALC 87 06/10/2022   TRIG 99 06/10/2022   CHOLHDL 2.9 06/10/2022     3. Permanent atrial fibrillation (HCC) Is on  metoprolol with good rate control. Here for INR for coumadin  Indication: atrial fibrillation Bleeding signs/symptoms: None Thromboembolic signs/symptoms: None  Missed Coumadin doses: None Medication changes: no Dietary changes: no Bacterial/viral infection: no Other concerns: no     4. Hypothyroidism (acquired) No problems that she is aware of. Lab Results  Component Value Date   TSH 1.390 06/10/2022     5. Seizure disorder, generalized convulsive, intractable ([place]) Has been on keppra for many years. Has had no seizure activity since starting meds.  6. Metabolic syndrome Deos not check blood sugars at [place] Lab Results  Component Value Date   HGBA1C 5.4 11/03/2020     7. Late effect of cerebrovascular accident No permanent effects  8. Morbid obesity ([place]) No recent weight changes Wt Readings from Last 3 Encounters:  10/11/22 179 lb (81.2 kg)  09/13/22 180 lb (81.6 kg)  08/08/22 181 lb (82.1 kg)   BMI Readings from Last 3 Encounters:  10/11/22 32.74 kg/m  09/13/22 32.92 kg/m  08/08/22 33.11  kg/m      New complaints: None today  Allergies  Allergen Reactions   Sulfa Antibiotics Rash    rash   Outpatient Encounter Medications as of 10/11/2022  Medication Sig   amLODipine (NORVASC) 2.5 MG tablet TAKE 1 TABLET BY MOUTH EVERY DAY   atorvastatin (LIPITOR) 10 MG tablet TAKE 1 TABLET (10 MG TOTAL) BY MOUTH DAILY AT 6 PM.   betamethasone dipropionate 0.05 % cream Apply daily as needed to affected area   calcium-vitamin D (OSCAL WITH D) 500-200 MG-UNIT tablet Take 1 tablet by mouth.   furosemide (LASIX) 20 MG tablet TAKE 1 TABLET EVERY DAY AS NEEDED   levETIRAcetam (KEPPRA) 500 MG tablet Take 1 tablet (500 mg total) by mouth daily.   levothyroxine (SYNTHROID) 88 MCG tablet TAKE 1 TABLET (88 MCG TOTAL) BY MOUTH DAILY.   lisinopril (ZESTRIL) 20 MG tablet TAKE 1 TABLET BY MOUTH TWICE A DAY   metoprolol tartrate (LOPRESSOR) 50 MG tablet TAKE 2 TABLETS BY MOUTH IN MORNING AND 1 TABLET BY MOUTH EVERY NIGHT AT BEDTIME   TURMERIC PO Take by mouth.   warfarin (COUMADIN) 7.5 MG tablet TAKE 1 TABLET (7.5 MG TOTAL) BY MOUTH ONE TIME ONLY AT 6 PM.   No facility-administered encounter medications on file as of 10/11/2022.    Past Surgical History:  Procedure Laterality Date   BLADDER SURGERY     bladder prolapse   TUBAL LIGATION      Family  History  Problem Relation Age of Onset   Atrial fibrillation Mother        pacemaker   Hypertension Mother    Heart disease Sister        MI in 34s   Stroke Sister    Heart disease Father    Heart disease Sister    Heart disease Sister    Hypertension Sister    Anxiety disorder Sister    Heart disease Brother    Cancer Brother        pancreatic   Heart disease Son    Stroke Son 12      Controlled substance contract: n/a     Review of Systems  Constitutional:  Negative for diaphoresis.  Eyes:  Negative for pain.  Respiratory:  Negative for shortness of breath.   Cardiovascular:  Negative for chest pain, palpitations and leg  swelling.  Gastrointestinal:  Negative for abdominal pain.  Endocrine: Negative for polydipsia.  Skin:  Negative for rash.  Neurological:  Negative for dizziness, weakness and headaches.  Hematological:  Does not bruise/bleed easily.  All other systems reviewed and are negative.      Objective:   Physical Exam Vitals and nursing note reviewed.  Constitutional:      General: She is not in acute distress.    Appearance: Normal appearance. She is well-developed.  HENT:     Head: Normocephalic.     Right Ear: Tympanic membrane normal.     Left Ear: Tympanic membrane normal.     Nose: Nose normal.     Mouth/Throat:     Mouth: Mucous membranes are moist.  Eyes:     Pupils: Pupils are equal, round, and reactive to light.  Neck:     Vascular: No carotid bruit or JVD.  Cardiovascular:     Rate and Rhythm: Normal rate and regular rhythm.     Heart sounds: Normal heart sounds.  Pulmonary:     Effort: Pulmonary effort is normal. No respiratory distress.     Breath sounds: Normal breath sounds. No wheezing or rales.  Chest:     Chest wall: No tenderness.  Abdominal:     General: Bowel sounds are normal. There is no distension or abdominal bruit.     Palpations: Abdomen is soft. There is no hepatomegaly, splenomegaly, mass or pulsatile mass.     Tenderness: There is no abdominal tenderness.  Musculoskeletal:        General: Normal range of motion.     Cervical back: Normal range of motion and neck supple.  Lymphadenopathy:     Cervical: No cervical adenopathy.  Skin:    General: Skin is warm and dry.  Neurological:     Mental Status: She is alert and oriented to person, place, and time.     Deep Tendon Reflexes: Reflexes are normal and symmetric.  Psychiatric:        Behavior: Behavior normal.        Thought Content: Thought content normal.        Judgment: Judgment normal.    BP 128/83   Pulse (!) 56   Temp 97.6 F (36.4 C) (Temporal)   Resp 20   Ht '5\' 2"'$  (1.575 m)    Wt 179 lb (81.2 kg)   SpO2 98%   BMI 32.74 kg/m   HGBA1c 5.9  INR 2.6      Assessment & Plan:  Melissa Knight comes in today with chief complaint of Medical Management of Chronic Issues  Diagnosis and orders addressed:  1. Essential hypertension Low sodium diet - CBC with Differential/Platelet - CMP14+EGFR - amLODipine (NORVASC) 2.5 MG tablet; Take 1 tablet (2.5 mg total) by mouth daily.  Dispense: 90 tablet; Refill: 1 - furosemide (LASIX) 20 MG tablet; TAKE 1 TABLET EVERY DAY AS NEEDED  Dispense: 90 tablet; Refill: 1 - lisinopril (ZESTRIL) 20 MG tablet; TAKE 1 TABLET BY MOUTH TWICE A DAY  Dispense: 180 tablet; Refill: 1  2. Mixed hyperlipidemia Low fat diet - Lipid panel - atorvastatin (LIPITOR) 10 MG tablet; Take 1 tablet (10 mg total) by mouth daily at 6 PM.  Dispense: 90 tablet; Refill: 1  3. Permanent atrial fibrillation (HCC) Avoid caffeine Continue current coumadin dose - CoaguChek XS/INR Waived - metoprolol tartrate (LOPRESSOR) 50 MG tablet; TAKE 2 TABLETS BY MOUTH IN MORNING AND 1 TABLET BY MOUTH EVERY NIGHT AT BEDTIME  Dispense: 270 tablet; Refill: 1 - warfarin (COUMADIN) 7.5 MG tablet; Take 1 tablet (7.5 mg total) by mouth one time only at 6 PM.  Dispense: 90 tablet; Refill: 1  4. Hypothyroidism (acquired) Labs pending - Thyroid Panel With TSH - levothyroxine (SYNTHROID) 88 MCG tablet; TAKE 1 TABLET (88 MCG TOTAL) BY MOUTH DAILY.  Dispense: 90 tablet; Refill: 1  5. Seizure disorder, generalized convulsive, intractable ([place]) Report any seizure activity - levETIRAcetam (KEPPRA) 500 MG tablet; Take 1 tablet (500 mg total) by mouth daily.  Dispense: 90 tablet; Refill: 0  6. Metabolic syndrome - Bayer DCA Hb A1c Waived  7. Late effect of cerebrovascular accident  8. Morbid obesity ([place]) Discussed diet and exercise for person with BMI >25 Will recheck weight in 3-6 months    Labs pending Health Maintenance reviewed Diet and exercise encouraged  Follow  up plan: 1 month INR   Mary-Margaret Hassell Done, FNP

## 2022-10-12 ENCOUNTER — Telehealth: Payer: Self-pay | Admitting: *Deleted

## 2022-10-12 DIAGNOSIS — D649 Anemia, unspecified: Secondary | ICD-10-CM

## 2022-10-12 LAB — CMP14+EGFR
ALT: 7 IU/L (ref 0–32)
AST: 14 IU/L (ref 0–40)
Albumin/Globulin Ratio: 1.8 (ref 1.2–2.2)
Albumin: 3.9 g/dL (ref 3.7–4.7)
Alkaline Phosphatase: 44 IU/L (ref 44–121)
BUN/Creatinine Ratio: 13 (ref 12–28)
BUN: 11 mg/dL (ref 8–27)
Bilirubin Total: 0.4 mg/dL (ref 0.0–1.2)
CO2: 21 mmol/L (ref 20–29)
Calcium: 9.3 mg/dL (ref 8.7–10.3)
Chloride: 104 mmol/L (ref 96–106)
Creatinine, Ser: 0.86 mg/dL (ref 0.57–1.00)
Globulin, Total: 2.2 g/dL (ref 1.5–4.5)
Glucose: 88 mg/dL (ref 70–99)
Potassium: 4.5 mmol/L (ref 3.5–5.2)
Sodium: 141 mmol/L (ref 134–144)
Total Protein: 6.1 g/dL (ref 6.0–8.5)
eGFR: 65 mL/min/{1.73_m2} (ref >59)

## 2022-10-12 LAB — CBC WITH DIFFERENTIAL/PLATELET
Basophils Absolute: 0 10*3/uL (ref 0.0–0.2)
Basos: 0 %
EOS (ABSOLUTE): 0.1 10*3/uL (ref 0.0–0.4)
Eos: 2 %
Hematocrit: 29.8 % — ABNORMAL LOW (ref 34.0–46.6)
Hemoglobin: 8.8 g/dL — CL (ref 11.1–15.9)
Immature Grans (Abs): 0 10*3/uL (ref 0.0–0.1)
Immature Granulocytes: 0 %
Lymphocytes Absolute: 1.9 10*3/uL (ref 0.7–3.1)
Lymphs: 27 %
MCH: 24.2 pg — ABNORMAL LOW (ref 26.6–33.0)
MCHC: 29.5 g/dL — ABNORMAL LOW (ref 31.5–35.7)
MCV: 82 fL (ref 79–97)
Monocytes Absolute: 0.7 10*3/uL (ref 0.1–0.9)
Monocytes: 9 %
Neutrophils Absolute: 4.4 10*3/uL (ref 1.4–7.0)
Neutrophils: 62 %
Platelets: 264 10*3/uL (ref 150–450)
RBC: 3.64 x10E6/uL — ABNORMAL LOW (ref 3.77–5.28)
RDW: 13.7 % (ref 11.7–15.4)
WBC: 7.1 10*3/uL (ref 3.4–10.8)

## 2022-10-12 LAB — LIPID PANEL
Chol/HDL Ratio: 2.8 ratio (ref 0.0–4.4)
Cholesterol, Total: 139 mg/dL (ref 100–199)
HDL: 49 mg/dL (ref >39)
LDL Chol Calc (NIH): 70 mg/dL (ref 0–99)
Triglycerides: 111 mg/dL (ref 0–149)
VLDL Cholesterol Cal: 20 mg/dL (ref 5–40)

## 2022-10-12 LAB — THYROID PANEL WITH TSH
Free Thyroxine Index: 3.5 (ref 1.2–4.9)
T3 Uptake Ratio: 32 % (ref 24–39)
T4, Total: 10.9 ug/dL (ref 4.5–12.0)
TSH: 0.875 u[IU]/mL (ref 0.450–4.500)

## 2022-10-12 NOTE — Telephone Encounter (Signed)
Hemoglobin is 8.8. Can we add an iron panel and give the patient an FOBT? Is she having any bleeding, blood in her stool? Lightheadedness, dizziness?

## 2022-10-12 NOTE — Telephone Encounter (Signed)
Critical lab hgb 8.8. Please advise.

## 2022-10-12 NOTE — Addendum Note (Signed)
Addended by: Milas Hock on: 10/12/2022 09:55 AM   Modules accepted: Orders

## 2022-10-12 NOTE — Telephone Encounter (Signed)
Pt denies dizziness and lightheadedness but states she has had some blood in her stool the last couple of months and forgot to mention it to MMM at her visit. FOBT ordered and lab add on form filled out and given to the lab for iron, IBC and Ferritin. Pt will come in today to pick up FOBT.

## 2022-10-14 ENCOUNTER — Other Ambulatory Visit: Payer: Self-pay

## 2022-10-14 ENCOUNTER — Other Ambulatory Visit: Payer: Medicare Other

## 2022-10-14 DIAGNOSIS — D649 Anemia, unspecified: Secondary | ICD-10-CM

## 2022-10-14 LAB — IRON AND TIBC
Iron Saturation: 9 % — CL (ref 15–55)
Iron: 31 ug/dL (ref 27–139)
Total Iron Binding Capacity: 355 ug/dL (ref 250–450)
UIBC: 324 ug/dL (ref 118–369)

## 2022-10-14 LAB — HEMOGLOBIN, FINGERSTICK: Hemoglobin: 8.7 g/dL — ABNORMAL LOW (ref 11.1–15.9)

## 2022-10-14 LAB — SPECIMEN STATUS REPORT

## 2022-10-14 LAB — FERRITIN: Ferritin: 12 ng/mL — ABNORMAL LOW (ref 15–150)

## 2022-10-14 MED ORDER — FOLIC ACID 1 MG PO TABS: 1.0000 mg | ORAL_TABLET | Freq: Every day | ORAL | 1 refills | Status: DC

## 2022-10-14 NOTE — Addendum Note (Signed)
Addended by: Chevis Pretty on: 10/14/2022 07:53 AM   Modules accepted: Orders

## 2022-10-20 ENCOUNTER — Other Ambulatory Visit: Payer: Medicare Other

## 2022-10-20 ENCOUNTER — Other Ambulatory Visit: Payer: Self-pay | Admitting: Nurse Practitioner

## 2022-10-20 DIAGNOSIS — D649 Anemia, unspecified: Secondary | ICD-10-CM | POA: Diagnosis not present

## 2022-10-20 LAB — HEMOGLOBIN, FINGERSTICK: Hemoglobin: 8.7 g/dL — ABNORMAL LOW (ref 11.1–15.9)

## 2022-10-21 LAB — FECAL OCCULT BLOOD, IMMUNOCHEMICAL: Fecal Occult Bld: POSITIVE — AB

## 2022-11-08 ENCOUNTER — Encounter: Payer: Self-pay | Admitting: Nurse Practitioner

## 2022-11-08 ENCOUNTER — Ambulatory Visit (INDEPENDENT_AMBULATORY_CARE_PROVIDER_SITE_OTHER): Payer: Medicare Other | Admitting: Nurse Practitioner

## 2022-11-08 VITALS — BP 158/86 | HR 55 | Temp 97.5°F | Resp 20 | Ht 62.0 in | Wt 178.0 lb

## 2022-11-08 DIAGNOSIS — I4821 Permanent atrial fibrillation: Secondary | ICD-10-CM

## 2022-11-08 LAB — COAGUCHEK XS/INR WAIVED
INR: 2.8 — ABNORMAL HIGH (ref 0.9–1.1)
Prothrombin Time: 34.2 s

## 2022-11-08 NOTE — Progress Notes (Signed)
Subjective:   Chief Complaint: INR recheck   Indication: atrial fibrillation Bleeding signs/symptoms: None Thromboembolic signs/symptoms: None  Missed Coumadin doses: None Medication changes: no Dietary changes: no Bacterial/viral infection: no Other concerns: no  The following portions of the patient's history were reviewed and updated as appropriate: allergies, current medications, past family history, past medical history, past social history, past surgical history, and problem list.  Review of Systems Pertinent items noted in HPI and remainder of comprehensive ROS otherwise negative.   Objective:    INR Today: 2.8  Current dose: coumadin 3.'75mg'$  except 7.'5mg'$  on thursday   Assessment:    Therapeutic INR for goal of 2-3   Plan:    1. New dose: no change   2. Next INR: 1 month   Mary-Margaret Hassell Done, FNP

## 2022-12-06 ENCOUNTER — Encounter: Payer: Self-pay | Admitting: Nurse Practitioner

## 2022-12-06 ENCOUNTER — Ambulatory Visit (INDEPENDENT_AMBULATORY_CARE_PROVIDER_SITE_OTHER): Payer: Medicare Other | Admitting: Nurse Practitioner

## 2022-12-06 VITALS — BP 155/83 | HR 50 | Temp 96.9°F | Resp 20 | Ht 62.0 in | Wt 175.0 lb

## 2022-12-06 DIAGNOSIS — I4821 Permanent atrial fibrillation: Secondary | ICD-10-CM

## 2022-12-06 LAB — COAGUCHEK XS/INR WAIVED
INR: 2.8 — ABNORMAL HIGH (ref 0.9–1.1)
Prothrombin Time: 33.6 s

## 2022-12-06 NOTE — Progress Notes (Signed)
Subjective:   Chief Complaint: INR recheck   Indication: atrial fibrillation Bleeding signs/symptoms: None Thromboembolic signs/symptoms: None  Missed Coumadin doses: None Medication changes: no Dietary changes: no Bacterial/viral infection: no Other concerns: no  The following portions of the patient's history were reviewed and updated as appropriate: allergies, current medications, past family history, past medical history, past social history, past surgical history, and problem list.  Review of Systems Pertinent items noted in HPI and remainder of comprehensive ROS otherwise negative.   Objective:    INR Today: 2.8  Current dose: coumadin 3.75mg except 7.5mg on thursday   Assessment:    Therapeutic INR for goal of 2-3   Plan:    1. New dose: no change   2. Next INR: 1 month   Melissa Yvan Dority, FNP   

## 2022-12-31 ENCOUNTER — Other Ambulatory Visit: Payer: Self-pay | Admitting: Nurse Practitioner

## 2022-12-31 DIAGNOSIS — G40319 Generalized idiopathic epilepsy and epileptic syndromes, intractable, without status epilepticus: Secondary | ICD-10-CM

## 2023-01-10 ENCOUNTER — Encounter: Payer: Self-pay | Admitting: Nurse Practitioner

## 2023-01-10 ENCOUNTER — Ambulatory Visit (INDEPENDENT_AMBULATORY_CARE_PROVIDER_SITE_OTHER): Payer: Medicare Other | Admitting: Nurse Practitioner

## 2023-01-10 VITALS — BP 132/73 | HR 57 | Temp 97.3°F | Resp 20 | Ht 62.0 in | Wt 175.0 lb

## 2023-01-10 DIAGNOSIS — D649 Anemia, unspecified: Secondary | ICD-10-CM

## 2023-01-10 DIAGNOSIS — I4821 Permanent atrial fibrillation: Secondary | ICD-10-CM

## 2023-01-10 LAB — HEMOGLOBIN, FINGERSTICK: Hemoglobin: 11.5 g/dL (ref 11.1–15.9)

## 2023-01-10 LAB — COAGUCHEK XS/INR WAIVED
INR: 3.1 — ABNORMAL HIGH (ref 0.9–1.1)
Prothrombin Time: 37.3 s

## 2023-01-10 NOTE — Progress Notes (Signed)
Subjective:   Chief Complaint: INR recheck   Indication: atrial fibrillation Bleeding signs/symptoms: None Thromboembolic signs/symptoms: None  Missed Coumadin doses: None Medication changes: no Dietary changes: no Bacterial/viral infection: no Other concerns: no  The following portions of the patient's history were reviewed and updated as appropriate: allergies, current medications, past family history, past medical history, past social history, past surgical history, and problem list.  Review of Systems Pertinent items noted in HPI and remainder of comprehensive ROS otherwise negative.   Objective:    INR Today: 3.1 Current dose: coumadin 3.75mg  except 7.5mg  on thursday   Assessment:    Supratherapeutic INR for goal of 2-3   Plan:    1. New dose: 3.75mg  on Thursday this week then back to   coumadin 3.75mg  except 7.5mg  on thursday 2. Next INR: 1 month  Mary-Margaret Daphine Deutscher, FNP

## 2023-02-07 ENCOUNTER — Encounter: Payer: Self-pay | Admitting: Nurse Practitioner

## 2023-02-07 ENCOUNTER — Ambulatory Visit (INDEPENDENT_AMBULATORY_CARE_PROVIDER_SITE_OTHER): Payer: Medicare Other | Admitting: Nurse Practitioner

## 2023-02-07 VITALS — BP 130/80 | HR 52 | Temp 97.9°F | Ht 62.0 in | Wt 174.2 lb

## 2023-02-07 DIAGNOSIS — D649 Anemia, unspecified: Secondary | ICD-10-CM

## 2023-02-07 DIAGNOSIS — I4821 Permanent atrial fibrillation: Secondary | ICD-10-CM

## 2023-02-07 DIAGNOSIS — Z7901 Long term (current) use of anticoagulants: Secondary | ICD-10-CM

## 2023-02-07 LAB — COAGUCHEK XS/INR WAIVED
INR: 1.8 — ABNORMAL HIGH (ref 0.9–1.1)
Prothrombin Time: 21.7 s

## 2023-02-07 LAB — HEMOGLOBIN, FINGERSTICK: Hemoglobin: 12.7 g/dL (ref 11.1–15.9)

## 2023-02-07 NOTE — Progress Notes (Signed)
Subjective:   ChieF Complaint: INR recheck   Indication: atrial fibrillation Bleeding signs/symptoms: None Thromboembolic signs/symptoms: None  Missed Coumadin doses: None Medication changes: no Dietary changes: no Bacterial/viral infection: no Other concerns: no  The following portions of the patient's history were reviewed and updated as appropriate: allergies, current medications, past family history, past medical history, past social history, past surgical history, and problem list.  Review of Systems Pertinent items noted in HPI and remainder of comprehensive ROS otherwise negative.   Objective:    INR Today: 1.8 Hgb 12.7 Current dose: coumadin 3.75mg  except 7.5mg  on thursday   Assessment:    Subtherapeutic INR for goal of 2-3   Plan:    1. New dose: Coumadin 7..5mg  today then back to coumadin 3.75mg  except 7.5mg  on thursday    2. Next INR: 1 month  Mary-Margaret Daphine Deutscher, FNP

## 2023-02-08 ENCOUNTER — Ambulatory Visit: Payer: Medicare Other | Admitting: Nurse Practitioner

## 2023-03-01 ENCOUNTER — Other Ambulatory Visit: Payer: Self-pay | Admitting: Nurse Practitioner

## 2023-03-07 ENCOUNTER — Encounter: Payer: Self-pay | Admitting: Nurse Practitioner

## 2023-03-07 ENCOUNTER — Ambulatory Visit (INDEPENDENT_AMBULATORY_CARE_PROVIDER_SITE_OTHER): Payer: Medicare Other | Admitting: Nurse Practitioner

## 2023-03-07 VITALS — BP 131/87 | HR 55 | Temp 97.7°F | Resp 20 | Ht 62.0 in | Wt 173.0 lb

## 2023-03-07 DIAGNOSIS — I4821 Permanent atrial fibrillation: Secondary | ICD-10-CM | POA: Diagnosis not present

## 2023-03-07 LAB — COAGUCHEK XS/INR WAIVED
INR: 2.7 — ABNORMAL HIGH (ref 0.9–1.1)
Prothrombin Time: 31.9 s

## 2023-03-07 NOTE — Progress Notes (Signed)
Subjective:   Chief Complaint: INR recheck   Indication: atrial fibrillation Bleeding signs/symptoms: None Thromboembolic signs/symptoms: None  Missed Coumadin doses: None Medication changes: no Dietary changes: no Bacterial/viral infection: no Other concerns: no  The following portions of the patient's history were reviewed and updated as appropriate: allergies, current medications, past family history, past medical history, past social history, past surgical history, and problem list.  Review of Systems Pertinent items noted in HPI and remainder of comprehensive ROS otherwise negative.   Objective:    INR Today: 2.7 Current dose: coumadin 3.75mg  except 7.5mg  on thursday   Assessment:    Therapeutic INR for goal of 2-3   Plan:    1. New dose: no change   2. Next INR: 1 month  Mary-Margaret Daphine Deutscher, FNP

## 2023-04-07 ENCOUNTER — Ambulatory Visit (INDEPENDENT_AMBULATORY_CARE_PROVIDER_SITE_OTHER): Payer: Medicare Other | Admitting: Nurse Practitioner

## 2023-04-07 ENCOUNTER — Encounter: Payer: Self-pay | Admitting: Nurse Practitioner

## 2023-04-07 VITALS — BP 136/79 | HR 50 | Temp 97.6°F | Resp 20 | Ht 62.0 in | Wt 170.0 lb

## 2023-04-07 DIAGNOSIS — I4821 Permanent atrial fibrillation: Secondary | ICD-10-CM | POA: Diagnosis not present

## 2023-04-07 DIAGNOSIS — Z7901 Long term (current) use of anticoagulants: Secondary | ICD-10-CM

## 2023-04-07 LAB — COAGUCHEK XS/INR WAIVED
INR: 3.1 — ABNORMAL HIGH (ref 0.9–1.1)
Prothrombin Time: 37.7 s

## 2023-04-07 NOTE — Progress Notes (Signed)
Subjective:   Chief Complaint: INR recheck   Indication: atrial fibrillation Bleeding signs/symptoms: None Thromboembolic signs/symptoms: None  Missed Coumadin doses: None Medication changes: no Dietary changes: no Bacterial/viral infection: no Other concerns: no  The following portions of the patient's history were reviewed and updated as appropriate: allergies, current medications, past family history, past medical history, past social history, past surgical history, and problem list.  Review of Systems Pertinent items noted in HPI and remainder of comprehensive ROS otherwise negative.   Objective:    INR Today: 3.1 Current dose: coumadin 3.75mg  except 7.5mg  on thursday   Assessment:    Supratherapeutic INR for goal of 2-3   Plan:    1. New dose: no change   2. Next INR: 1 month  Mary-Margaret Daphine Deutscher, FNP

## 2023-04-20 ENCOUNTER — Other Ambulatory Visit: Payer: Self-pay

## 2023-04-20 DIAGNOSIS — Z780 Asymptomatic menopausal state: Secondary | ICD-10-CM

## 2023-05-08 ENCOUNTER — Other Ambulatory Visit: Payer: Self-pay | Admitting: Nurse Practitioner

## 2023-05-08 ENCOUNTER — Ambulatory Visit: Payer: Medicare Other

## 2023-05-08 ENCOUNTER — Encounter: Payer: Self-pay | Admitting: Nurse Practitioner

## 2023-05-08 ENCOUNTER — Ambulatory Visit (INDEPENDENT_AMBULATORY_CARE_PROVIDER_SITE_OTHER): Payer: Medicare Other | Admitting: Nurse Practitioner

## 2023-05-08 ENCOUNTER — Other Ambulatory Visit: Payer: Self-pay

## 2023-05-08 VITALS — BP 146/78 | HR 55 | Temp 97.0°F | Ht 62.0 in | Wt 171.0 lb

## 2023-05-08 DIAGNOSIS — G40319 Generalized idiopathic epilepsy and epileptic syndromes, intractable, without status epilepticus: Secondary | ICD-10-CM

## 2023-05-08 DIAGNOSIS — N393 Stress incontinence (female) (male): Secondary | ICD-10-CM | POA: Diagnosis not present

## 2023-05-08 DIAGNOSIS — E782 Mixed hyperlipidemia: Secondary | ICD-10-CM | POA: Diagnosis not present

## 2023-05-08 DIAGNOSIS — D6852 Prothrombin gene mutation: Secondary | ICD-10-CM | POA: Diagnosis not present

## 2023-05-08 DIAGNOSIS — I6930 Unspecified sequelae of cerebral infarction: Secondary | ICD-10-CM

## 2023-05-08 DIAGNOSIS — I4821 Permanent atrial fibrillation: Secondary | ICD-10-CM | POA: Diagnosis not present

## 2023-05-08 DIAGNOSIS — E8881 Metabolic syndrome: Secondary | ICD-10-CM

## 2023-05-08 DIAGNOSIS — E039 Hypothyroidism, unspecified: Secondary | ICD-10-CM

## 2023-05-08 DIAGNOSIS — I10 Essential (primary) hypertension: Secondary | ICD-10-CM | POA: Diagnosis not present

## 2023-05-08 LAB — COAGUCHEK XS/INR WAIVED
INR: 3.6 — ABNORMAL HIGH (ref 0.9–1.1)
Prothrombin Time: 43.7 s

## 2023-05-08 MED ORDER — AMLODIPINE BESYLATE 2.5 MG PO TABS
2.5000 mg | ORAL_TABLET | Freq: Every day | ORAL | 1 refills | Status: DC
Start: 2023-05-08 — End: 2023-10-27

## 2023-05-08 MED ORDER — LISINOPRIL 20 MG PO TABS
ORAL_TABLET | ORAL | 1 refills | Status: DC
Start: 2023-05-08 — End: 2023-09-21

## 2023-05-08 MED ORDER — WARFARIN SODIUM 7.5 MG PO TABS: 7.5000 mg | ORAL_TABLET | Freq: Once | ORAL | 1 refills | Status: DC

## 2023-05-08 MED ORDER — LEVETIRACETAM 500 MG PO TABS: 500.0000 mg | ORAL_TABLET | Freq: Every day | ORAL | 1 refills | Status: DC

## 2023-05-08 MED ORDER — METOPROLOL TARTRATE 50 MG PO TABS
ORAL_TABLET | ORAL | 1 refills | Status: DC
Start: 2023-05-08 — End: 2023-10-27

## 2023-05-08 MED ORDER — FUROSEMIDE 20 MG PO TABS
ORAL_TABLET | ORAL | 1 refills | Status: DC
Start: 2023-05-08 — End: 2023-10-27

## 2023-05-08 MED ORDER — ATORVASTATIN CALCIUM 10 MG PO TABS
10.0000 mg | ORAL_TABLET | Freq: Every day | ORAL | 1 refills | Status: DC
Start: 2023-05-08 — End: 2023-10-27

## 2023-05-08 MED ORDER — LEVOTHYROXINE SODIUM 88 MCG PO TABS
ORAL_TABLET | ORAL | 1 refills | Status: DC
Start: 2023-05-08 — End: 2023-10-27

## 2023-05-08 NOTE — Patient Instructions (Signed)

## 2023-05-08 NOTE — Progress Notes (Signed)
Subjective:    Patient ID: Melissa Knight, female    DOB: 20-Jul-1933, 89 y.o.   MRN: [id number]   Chief Complaint: medical management of chronic issues     HPI:  Melissa Knight is a 89 y.o. who identifies as a female who was assigned female at birth.   Social history: Lives with: husband Work history: retired   Water engineer in today for follow up of the following chronic medical issues:  1. Essential hypertension No c/o chest pain, sob or headache. Does not ceck blood pressure at home BP Readings from Last 3 Encounters:  04/07/23 136/79  03/07/23 131/87  02/07/23 130/80     2. Permanent atrial fibrillation (HCC) Is on coumadin and is doing well. Denies any palpitations or heart racing.  Indication: atrial fibrillation Bleeding signs/symptoms: None Thromboembolic signs/symptoms: None  Missed Coumadin doses: None Medication changes: no Dietary changes: no Bacterial/viral infection: no Other concerns: no     3. Hypothyroidism (acquired) No issues that aware of Lab Results  Component Value Date   TSH 0.875 10/11/2022     4. Mixed hyperlipidemia Does not rally watch diet and does no dedicated exercise. Lab Results  Component Value Date   CHOL 139 10/11/2022   HDL 49 10/11/2022   LDLCALC 70 10/11/2022   TRIG 111 10/11/2022   CHOLHDL 2.8 10/11/2022     5. Late effect of cerebrovascular accident No permanent effects  6. Metabolic syndrome Does not cHeck blood sugars at home. Lab Results  Component Value Date   HGBA1C 5.9 (H) 10/11/2022     7. Stress incontinence Wears pads  8. Seizure disorder, generalized convulsive, intractable (HCC) Has not had any more seizure issues. Continues to take keppra  9. Morbid obesity (HCC) No recent weight changes Wt Readings from Last 3 Encounters:  04/07/23 170 lb (77.1 kg)  03/07/23 173 lb (78.5 kg)  02/07/23 174 lb 4 oz (79 kg)      New complaints: None today  Allergies  Allergen Reactions   Sulfa  Antibiotics Rash    rash   Outpatient Encounter Medications as of 05/08/2023  Medication Sig   amLODipine (NORVASC) 2.5 MG tablet Take 1 tablet (2.5 mg total) by mouth daily.   atorvastatin (LIPITOR) 10 MG tablet Take 1 tablet (10 mg total) by mouth daily at 6 PM.   betamethasone dipropionate 0.05 % cream Apply daily as needed to affected area   calcium-vitamin D (OSCAL WITH D) 500-200 MG-UNIT tablet Take 1 tablet by mouth.   folic acid (FOLVITE) 1 MG tablet TAKE 1 TABLET BY MOUTH EVERY DAY   furosemide (LASIX) 20 MG tablet TAKE 1 TABLET EVERY DAY AS NEEDED   levETIRAcetam (KEPPRA) 500 MG tablet TAKE 1 TABLET (500 MG TOTAL) BY MOUTH DAILY.   levothyroxine (SYNTHROID) 88 MCG tablet TAKE 1 TABLET (88 MCG TOTAL) BY MOUTH DAILY.   lisinopril (ZESTRIL) 20 MG tablet TAKE 1 TABLET BY MOUTH TWICE A DAY   metoprolol tartrate (LOPRESSOR) 50 MG tablet TAKE 2 TABLETS BY MOUTH IN MORNING AND 1 TABLET BY MOUTH EVERY NIGHT AT BEDTIME   TURMERIC PO Take by mouth.   warfarin (COUMADIN) 7.5 MG tablet Take 1 tablet (7.5 mg total) by mouth one time only at 6 PM.   No facility-administered encounter medications on file as of 05/08/2023.    Past Surgical History:  Procedure Laterality Date   BLADDER SURGERY     bladder prolapse   TUBAL LIGATION      Family History  Problem  Relation Age of Onset   Atrial fibrillation Mother        pacemaker   Hypertension Mother    Heart disease Sister        MI in 8s   Stroke Sister    Heart disease Father    Heart disease Sister    Heart disease Sister    Hypertension Sister    Anxiety disorder Sister    Heart disease Brother    Cancer Brother        pancreatic   Heart disease Son    Stroke Son 50      Controlled substance contract: n/a     Review of Systems  Constitutional:  Negative for diaphoresis.  Eyes:  Negative for pain.  Respiratory:  Negative for shortness of breath.   Cardiovascular:  Negative for chest pain, palpitations and leg  swelling.  Gastrointestinal:  Negative for abdominal pain.  Endocrine: Negative for polydipsia.  Skin:  Negative for rash.  Neurological:  Negative for dizziness, weakness and headaches.  Hematological:  Does not bruise/bleed easily.  All other systems reviewed and are negative.      Objective:   Physical Exam Vitals and nursing note reviewed.  Constitutional:      General: She is not in acute distress.    Appearance: Normal appearance. She is well-developed.  HENT:     Head: Normocephalic.     Right Ear: Tympanic membrane normal.     Left Ear: Tympanic membrane normal.     Nose: Nose normal.     Mouth/Throat:     Mouth: Mucous membranes are moist.  Eyes:     Pupils: Pupils are equal, round, and reactive to light.  Neck:     Vascular: No carotid bruit or JVD.  Cardiovascular:     Rate and Rhythm: Normal rate. Rhythm irregular.     Heart sounds: Normal heart sounds.  Pulmonary:     Effort: Pulmonary effort is normal. No respiratory distress.     Breath sounds: Normal breath sounds. No wheezing or rales.  Chest:     Chest wall: No tenderness.  Abdominal:     General: Bowel sounds are normal. There is no distension or abdominal bruit.     Palpations: Abdomen is soft. There is no hepatomegaly, splenomegaly, mass or pulsatile mass.     Tenderness: There is no abdominal tenderness.  Musculoskeletal:        General: Normal range of motion.     Cervical back: Normal range of motion and neck supple.     Right lower leg: Edema (1+) present.     Left lower leg: Edema (1+) present.  Lymphadenopathy:     Cervical: No cervical adenopathy.  Skin:    General: Skin is warm and dry.  Neurological:     Mental Status: She is alert and oriented to person, place, and time.     Deep Tendon Reflexes: Reflexes are normal and symmetric.  Psychiatric:        Behavior: Behavior normal.        Thought Content: Thought content normal.        Judgment: Judgment normal.     BP (!) 146/78    Pulse (!) 55   Temp (!) 97 F (36.1 C) (Skin)   Ht 5\' 2"  (1.575 m)   Wt 171 lb (77.6 kg)   BMI 31.28 kg/m         Assessment & Plan:  Melissa Knight comes in today with chief complaint  of No chief complaint on file.   Diagnosis and orders addressed:  1. Essential hypertension Low sodium diet - amLODipine (NORVASC) 2.5 MG tablet; Take 1 tablet (2.5 mg total) by mouth daily.  Dispense: 90 tablet; Refill: 1 - furosemide (LASIX) 20 MG tablet; TAKE 1 TABLET EVERY DAY AS NEEDED  Dispense: 90 tablet; Refill: 1 - lisinopril (ZESTRIL) 20 MG tablet; TAKE 1 TABLET BY MOUTH TWICE A DAY  Dispense: 180 tablet; Refill: 1 - CBC with Differential/Platelet - CMP14+EGFR  2. Permanent atrial fibrillation (HCC) Avoid caffeine - metoprolol tartrate (LOPRESSOR) 50 MG tablet; TAKE 2 TABLETS BY MOUTH IN MORNING AND 1 TABLET BY MOUTH EVERY NIGHT AT BEDTIME  Dispense: 270 tablet; Refill: 1 - warfarin (COUMADIN) 7.5 MG tablet; Take 1 tablet (7.5 mg total) by mouth one time only at 6 PM.  Dispense: 90 tablet; Refill: 1  3. Hypothyroidism (acquired) Labs pending - levothyroxine (SYNTHROID) 88 MCG tablet; TAKE 1 TABLET (88 MCG TOTAL) BY MOUTH DAILY.  Dispense: 90 tablet; Refill: 1 - Thyroid Panel With TSH  4. Mixed hyperlipidemia Low fat diet - atorvastatin (LIPITOR) 10 MG tablet; Take 1 tablet (10 mg total) by mouth daily at 6 PM.  Dispense: 90 tablet; Refill: 1 - Lipid panel  5. Late effect of cerebrovascular accident  6. Metabolic syndrome Watch carbs in diet  7. Stress incontinence  8. Seizure disorder, generalized convulsive, intractable (HCC) - levETIRAcetam (KEPPRA) 500 MG tablet; Take 1 tablet (500 mg total) by mouth daily.  Dispense: 90 tablet; Refill: 1  9. Morbid obesity (HCC) Discussed diet and exercise for person with BMI >25 Will recheck weight in 3-6 months    Labs pending Health Maintenance reviewed Diet and exercise encouraged  Follow up plan: 1 month   Mary-Margaret  Daphine Deutscher, FNP

## 2023-05-09 LAB — CBC WITH DIFFERENTIAL/PLATELET
Basophils Absolute: 0 10*3/uL (ref 0.0–0.2)
Basos: 0 %
EOS (ABSOLUTE): 0.1 10*3/uL (ref 0.0–0.4)
Eos: 1 %
Hematocrit: 39.5 % (ref 34.0–46.6)
Hemoglobin: 13 g/dL (ref 11.1–15.9)
Immature Grans (Abs): 0 10*3/uL (ref 0.0–0.1)
Immature Granulocytes: 0 %
Lymphocytes Absolute: 2 10*3/uL (ref 0.7–3.1)
Lymphs: 30 %
MCH: 29.7 pg (ref 26.6–33.0)
MCHC: 32.9 g/dL (ref 31.5–35.7)
MCV: 90 fL (ref 79–97)
Monocytes Absolute: 0.7 10*3/uL (ref 0.1–0.9)
Monocytes: 10 %
Neutrophils Absolute: 3.9 10*3/uL (ref 1.4–7.0)
Neutrophils: 59 %
Platelets: 147 10*3/uL — ABNORMAL LOW (ref 150–450)
RBC: 4.37 x10E6/uL (ref 3.77–5.28)
RDW: 12.8 % (ref 11.7–15.4)
WBC: 6.7 10*3/uL (ref 3.4–10.8)

## 2023-05-09 LAB — COMPREHENSIVE METABOLIC PANEL
ALT: 7 IU/L (ref 0–32)
AST: 18 IU/L (ref 0–40)
Albumin: 3.8 g/dL (ref 3.7–4.7)
Alkaline Phosphatase: 45 IU/L (ref 44–121)
BUN/Creatinine Ratio: 16 (ref 12–28)
BUN: 12 mg/dL (ref 8–27)
Bilirubin Total: 0.5 mg/dL (ref 0.0–1.2)
CO2: 22 mmol/L (ref 20–29)
Calcium: 8.9 mg/dL (ref 8.7–10.3)
Chloride: 106 mmol/L (ref 96–106)
Creatinine, Ser: 0.75 mg/dL (ref 0.57–1.00)
Globulin, Total: 2 g/dL (ref 1.5–4.5)
Glucose: 87 mg/dL (ref 70–99)
Potassium: 3.8 mmol/L (ref 3.5–5.2)
Sodium: 144 mmol/L (ref 134–144)
Total Protein: 5.8 g/dL — ABNORMAL LOW (ref 6.0–8.5)
eGFR: 77 mL/min/{1.73_m2} (ref >59)

## 2023-05-09 LAB — LIPID PANEL WITH LDL/HDL RATIO
Cholesterol, Total: 151 mg/dL (ref 100–199)
HDL: 46 mg/dL (ref >39)
LDL Chol Calc (NIH): 84 mg/dL (ref 0–99)
LDL/HDL Ratio: 1.8 ratio (ref 0.0–3.2)
Triglycerides: 119 mg/dL (ref 0–149)
VLDL Cholesterol Cal: 21 mg/dL (ref 5–40)

## 2023-05-29 ENCOUNTER — Other Ambulatory Visit: Payer: Self-pay | Admitting: Nurse Practitioner

## 2023-06-05 ENCOUNTER — Ambulatory Visit (INDEPENDENT_AMBULATORY_CARE_PROVIDER_SITE_OTHER): Payer: Medicare Other

## 2023-06-05 ENCOUNTER — Ambulatory Visit: Payer: Medicare Other | Admitting: Nurse Practitioner

## 2023-06-05 ENCOUNTER — Encounter: Payer: Self-pay | Admitting: Nurse Practitioner

## 2023-06-05 VITALS — BP 138/82 | HR 54 | Temp 97.3°F | Resp 20 | Ht 62.0 in | Wt 169.0 lb

## 2023-06-05 DIAGNOSIS — I4821 Permanent atrial fibrillation: Secondary | ICD-10-CM

## 2023-06-05 DIAGNOSIS — Z23 Encounter for immunization: Secondary | ICD-10-CM

## 2023-06-05 DIAGNOSIS — Z780 Asymptomatic menopausal state: Secondary | ICD-10-CM | POA: Diagnosis not present

## 2023-06-05 LAB — COAGUCHEK XS/INR WAIVED
INR: 2 — ABNORMAL HIGH (ref 0.9–1.1)
Prothrombin Time: 23.9 s

## 2023-06-05 NOTE — Progress Notes (Signed)
Subjective:   Chief Complaint: INR recheck   Indication: atrial fibrillation Bleeding signs/symptoms: None Thromboembolic signs/symptoms: None  Missed Coumadin doses: None Medication changes: no Dietary changes: no Bacterial/viral infection: no Other concerns: no  The following portions of the patient's history were reviewed and updated as appropriate: allergies, current medications, past family history, past medical history, past social history, past surgical history, and problem list.  Review of Systems Pertinent items noted in HPI and remainder of comprehensive ROS otherwise negative.   Objective:    INR Today: 2.0 Current dose:  Hold for 2 days the coumadin 3.75mg  except 7.5mg  on thursday   Assessment:    Therapeutic INR for goal of 2-3   Plan:    1. New dose: no change   2. Next INR: 1 month  Mary-Margaret Daphine Deutscher, FNP

## 2023-06-06 DIAGNOSIS — Z780 Asymptomatic menopausal state: Secondary | ICD-10-CM | POA: Diagnosis not present

## 2023-06-06 DIAGNOSIS — M8589 Other specified disorders of bone density and structure, multiple sites: Secondary | ICD-10-CM | POA: Diagnosis not present

## 2023-06-07 DIAGNOSIS — H25813 Combined forms of age-related cataract, bilateral: Secondary | ICD-10-CM | POA: Diagnosis not present

## 2023-06-08 ENCOUNTER — Other Ambulatory Visit: Payer: Self-pay

## 2023-06-08 DIAGNOSIS — M810 Age-related osteoporosis without current pathological fracture: Secondary | ICD-10-CM

## 2023-06-09 ENCOUNTER — Telehealth: Payer: Self-pay

## 2023-06-09 NOTE — Progress Notes (Signed)
   Care Guide Note  06/09/2023 Name: Melissa Knight MRN: [id number] DOB: 08/04/1933  Referred by: Bennie Pierini, FNP Reason for referral : Care Coordination (Outreach to schedule with Pharm d )   [name] is a 89 y.o. year old female who is a primary care patient of Bennie Pierini, FNP. Mao Giraldo was referred to the pharmacist for assistance related to  osteoporosis .    Successful contact was made with the patient to discuss pharmacy services including being ready for the pharmacist to call at least 5 minutes before the scheduled appointment time, to have medication bottles and any blood sugar or blood pressure readings ready for review. The patient agreed to meet with the pharmacist via with the pharmacist via telephone visit on (date/time).  07/14/2023  Penne Lash, RMA Care Guide [facility]  [place], [place] [address] Direct Dial: [phone] [email or web address]

## 2023-07-03 ENCOUNTER — Ambulatory Visit (INDEPENDENT_AMBULATORY_CARE_PROVIDER_SITE_OTHER): Payer: Medicare Other | Admitting: Nurse Practitioner

## 2023-07-03 ENCOUNTER — Encounter: Payer: Self-pay | Admitting: Nurse Practitioner

## 2023-07-03 VITALS — BP 147/85 | HR 58 | Temp 97.3°F | Resp 20 | Ht 62.0 in | Wt 169.0 lb

## 2023-07-03 DIAGNOSIS — I4821 Permanent atrial fibrillation: Secondary | ICD-10-CM

## 2023-07-03 LAB — COAGUCHEK XS/INR WAIVED
INR: 2.1 — ABNORMAL HIGH (ref 0.9–1.1)
Prothrombin Time: 25.2 s

## 2023-07-03 NOTE — Progress Notes (Signed)
Subjective:   Chief Complaint: INR recheck   Indication: atrial fibrillation Bleeding signs/symptoms: None Thromboembolic signs/symptoms: None  Missed Coumadin doses: None Medication changes: no Dietary changes: no Bacterial/viral infection: no Other concerns: no  The following portions of the patient's history were reviewed and updated as appropriate: allergies, current medications, past family history, past medical history, past social history, past surgical history, and problem list.  Review of Systems Pertinent items noted in HPI and remainder of comprehensive ROS otherwise negative.   Objective:    INR Today: 2.1 Current dose:  coumadin 3.75mg  except 7.5mg  on thursday   Assessment:      Therapeutic INR for goal of 2-3   Plan:    1. New dose: no change   2. Next INR: 1 month  Mary-Margaret Daphine Deutscher, FNP

## 2023-07-05 DIAGNOSIS — H25813 Combined forms of age-related cataract, bilateral: Secondary | ICD-10-CM | POA: Diagnosis not present

## 2023-07-05 DIAGNOSIS — H18593 Other hereditary corneal dystrophies, bilateral: Secondary | ICD-10-CM | POA: Diagnosis not present

## 2023-07-05 DIAGNOSIS — H02011 Cicatricial entropion of right upper eyelid: Secondary | ICD-10-CM | POA: Diagnosis not present

## 2023-07-05 DIAGNOSIS — H04123 Dry eye syndrome of bilateral lacrimal glands: Secondary | ICD-10-CM | POA: Diagnosis not present

## 2023-07-14 ENCOUNTER — Other Ambulatory Visit (INDEPENDENT_AMBULATORY_CARE_PROVIDER_SITE_OTHER): Payer: Medicare Other | Admitting: Pharmacist

## 2023-07-14 DIAGNOSIS — M8589 Other specified disorders of bone density and structure, multiple sites: Secondary | ICD-10-CM

## 2023-07-14 DIAGNOSIS — H2511 Age-related nuclear cataract, right eye: Secondary | ICD-10-CM | POA: Diagnosis not present

## 2023-07-14 HISTORY — PX: CATARACT EXTRACTION: SUR2

## 2023-07-14 NOTE — Progress Notes (Signed)
07/14/2023 Name: Melissa Knight MRN: [id number] DOB: May 21, 1933  Chief Complaint  Patient presents with   Osteopenia    Low bone mas    Melissa Knight is a 89 y.o. year old female who presented for a telephone visit.  I connected with  Melissa Knight on 07/14/23 by telephone and verified that I am speaking with the correct person using two identifiers.  I discussed the limitations of evaluation and management by telemedicine. The patient expressed understanding and agreed to proceed.  Patient was located in her home and PharmD in PCP office during this visit.  They were referred to the pharmacist by their PCP for assistance in managing  osteopenia .   Subjective:  Care Team: Primary Care Provider: Bennie Pierini, FNP ; Next Scheduled Visit:  Medication Access/Adherence  Current Pharmacy:  [facility] [phone] - [place], [place] - [address] [place] [address] Phone: [phone] Fax: [phone]   Current Height:    5'2"    Max Lifetime Height:  5'3" Current Weight:  169lb       Ethnicity:Caucasian  HPI: Does pt already have a diagnosis of:  Osteopenia?  Yes Osteoporosis?  No  Back Pain?  Yes       Kyphosis?  No Prior fracture?  Yes forarm years ago from falling on ice Med(s) for Osteoporosis/Osteopenia:  vit D/ calcium Med(s) previously tried for Osteoporosis/Osteopenia:  n/a                                                             PMH: Age at men[age]:  53-56 Hysterectomy?  No Oophorectomy?  No HRT? No Steroid Use?  No Thyroid med?  No History of cancer?  No History of digestive disorders (ie Crohn's)?  No Current or previous eating disorders?  No Last Vitamin D Result:  n/a; needs Last GFR Result:  77 (05/08/23)   FH/SH: Family history of osteoporosis?  No Parent with history of hip fracture?  No Family history of breast cancer?  No Exercise?  No Smoking?  No Alcohol?  No    Calcium Assessment Calcium Intake  # of  servings/day   Calcium mg  Milk (8 oz) 0.5  x  300  = 150mg   Yogurt (4 oz) 1 x  200 = 200mg   Cheese (1 oz) 1 x  200 = 200mg   Other Calcium sources     250mg   Ca supplement 1 = 650   Estimated calcium intake per day 1450mg       DEXA Results Date of Test T-Score for AP Spine L1-L4 T-Score for Total Left Hip T-Score for Total left femoral neck T-Score for Total Right Hip/R femoral neck T-Score for Total Left forearm  06/05/23 N/a -0.1 -1.3 0.4/-0.9 -2.1                        FRAX 10 year estimate: Total FX risk:  10.5%  (consider medication if >/= 20%) Hip FX risk:  2.9%  (consider medication if >/= 3%)   Objective:  Lab Results  Component Value Date   HGBA1C 5.9 (H) 10/11/2022    Lab Results  Component Value Date   CREATININE 0.75 05/08/2023   BUN 12 05/08/2023   NA 144 05/08/2023   K  3.8 05/08/2023   CL 106 05/08/2023   CO2 22 05/08/2023    Lab Results  Component Value Date   CHOL 151 05/08/2023   HDL 46 05/08/2023   LDLCALC 84 05/08/2023   TRIG 119 05/08/2023   CHOLHDL 2.8 10/11/2022    Medications Reviewed Today     Reviewed by Danella Maiers, [facility] (Pharmacist) on 07/14/23 at 1048  Med List Status: <None>   Medication Order Taking? Sig Documenting Provider Last Dose Status Informant  amLODipine (NORVASC) 2.5 MG tablet [id number] No Take 1 tablet (2.5 mg total) by mouth daily. Daphine Deutscher Mary-Margaret, FNP Taking Active   atorvastatin (LIPITOR) 10 MG tablet [id number] No Take 1 tablet (10 mg total) by mouth daily at 6 PM. Bennie Pierini, FNP Taking Active   betamethasone dipropionate 0.05 % cream [id number] No Apply daily as needed to affected area Bennie Pierini, FNP Taking Active   calcium-vitamin D (OSCAL WITH D) 500-200 MG-UNIT tablet [id number] No Take 1 tablet by mouth. [provider] Taking Active   folic acid (FOLVITE) 1 MG tablet [id number] No TAKE 1 TABLET BY MOUTH EVERY DAY Daphine Deutscher, Mary-Margaret, FNP Taking Active   furosemide  (LASIX) 20 MG tablet [id number] No TAKE 1 TABLET EVERY DAY AS NEEDED Daphine Deutscher, Mary-Margaret, FNP Taking Active   levETIRAcetam (KEPPRA) 500 MG tablet [id number] No Take 1 tablet (500 mg total) by mouth daily. Daphine Deutscher, Mary-Margaret, FNP Taking Active   levothyroxine (SYNTHROID) 88 MCG tablet [id number] No TAKE 1 TABLET (88 MCG TOTAL) BY MOUTH DAILY. Daphine Deutscher, Mary-Margaret, FNP Taking Active   lisinopril (ZESTRIL) 20 MG tablet [id number] No TAKE 1 TABLET BY MOUTH TWICE A DAY Martin, Mary-Margaret, FNP Taking Active   metoprolol tartrate (LOPRESSOR) 50 MG tablet [id number] No TAKE 2 TABLETS BY MOUTH IN MORNING AND 1 TABLET BY MOUTH EVERY NIGHT AT BEDTIME Bennie Pierini, FNP Taking Active   TURMERIC PO [id number] No Take by mouth. [provider] Taking Active   warfarin (COUMADIN) 7.5 MG tablet [id number] No Take 1 tablet (7.5 mg total) by mouth one time only at 6 PM. Bennie Pierini, FNP Taking Active             Assessment: Osteopenia based on BMD.   Fracture risk is increased. Increased risk is based on low BMD.  Recommendations: 1.  continue calcium 1200mg  daily through supplementation or diet.  2.  continue weight bearing exercise - 30 minutes at least 4 days per week.   3.  Counseled and educated about fall risk and prevention. Continue to use can to walk/assist  Recheck DEXA:  2 years  Time spent counseling patient:  15 minutes  Follow Up Plan: as needed  Kieth Brightly, PharmD, BCACP, CPP Clinical Pharmacist, [facility]

## 2023-07-18 ENCOUNTER — Other Ambulatory Visit: Payer: Self-pay

## 2023-07-18 ENCOUNTER — Encounter (HOSPITAL_COMMUNITY)
Admission: RE | Admit: 2023-07-18 | Discharge: 2023-07-18 | Disposition: A | Discharge status: Home / Self Care | Payer: Medicare Other | Source: Ambulatory Visit | Attending: Optometry | Admitting: Optometry

## 2023-07-18 ENCOUNTER — Encounter (HOSPITAL_COMMUNITY): Payer: Self-pay

## 2023-07-18 HISTORY — DX: Nausea with vomiting, unspecified: R11.2

## 2023-07-18 HISTORY — DX: Other specified postprocedural states: Z98.890

## 2023-07-20 NOTE — H&P (Addendum)
Surgical History & Physical  Patient Name: Melissa Knight  DOB: 10-17-1932  Surgery: Cataract extraction with intraocular lens implant phacoemulsification; Right eye Surgeon: Pecolia Ades MD Surgery Date: 07/21/2023 Pre-Op Date: 07/05/2023  HPI: A 54 Yr. old female patient present for cataract eval per Dr. Laural Benes. 1. The patient complains of sunlight during the day and at night the glare is really bad, difficulties reading road signs, and reading fine print, which began 1 year ago. Both eyes are affected, OD>OS. The symptoms are constant. The condition's severity is worsening. This is negatively affecting the patient's quality of life and the patient is unable to function adequately in life with the current level of vision.  Medical History: Cataracts  Cancer Heart Problem High Blood Pressure LDL Stroke Thyroid Problems hx seizure  Review of Systems Cardiovascular High Blood Pressure, A-fib Endocrine thyroid disease Neurological Stroke All recorded systems are negative except as noted above.  Social Former smoker   Medication folic acid ,  atorvastatin ,  warfarin ,  metoprolol succinate ,  lisinopril ,  levetiracetam   Sx/Procedures Bladder Sx, Skin Ca Nose, Tubal Ligation  Drug Allergies  Sulfa (Sulfonamide Antibiotics)   History & Physical: Heent: cataracts  NECK: supple without bruits LUNGS: lungs clear to auscultation CV: regular rate and rhythm Abdomen: soft and non-tender  Impression & Plan: Assessment: 1.  CATARACT AGE-RELATED COMBINED FORMS; Both Eyes (H25.813) 2.  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  DRY EYE SYNDROME/TEAR FILM INSUFFICIENCY; Both Eyes (H04.123) 4.  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  Hyperopia ; Both Eyes (H52.03)  Plan: 1.  Cataracts are visually significant and account for the patient's complaints. Discussed all risks, benefits, procedures and recovery, including infection, loss of vision and eye,  need for glasses after surgery or additional procedures. Patient understands changing glasses will not improve vision. Patient indicated understanding of procedure. All questions answered. Patient desires to have surgery, recommend phacoemulsification with intraocular lens. Patient to have preliminary testing necessary ([employer]/IOL Master, Mac OCT, TOPO) Educational materials provided:Cataract. RECOMMEND STANDARD VS EYEHANCE VS TORIC VS VIVTY.  Plan: - Proceed with cataract surgery OD, followed by OS - DIB00 lens with best distance target - Dextenza implant - ok with lying flat, no prior eye surgery - No DM, no fuchs, does have 2 trichiatic lashes  2.  Discussed condition with patient. Condition causing a decrease in vision  3.  Findings, prognosis and treatment options reviewed. Begin basic treatment regimen; warm compresses, lid scrubs, 1000-2000mg  omega 3 fatty acids, and preserved artificial tears QID.  4.  Asymptomatic, observe.  5.  per Dr. Laural Benes

## 2023-07-21 ENCOUNTER — Encounter (HOSPITAL_COMMUNITY): Payer: Self-pay | Admitting: Optometry

## 2023-07-21 ENCOUNTER — Ambulatory Visit (HOSPITAL_BASED_OUTPATIENT_CLINIC_OR_DEPARTMENT_OTHER): Payer: Medicare Other | Admitting: Certified Registered"

## 2023-07-21 ENCOUNTER — Ambulatory Visit (HOSPITAL_COMMUNITY)
Admission: RE | Admit: 2023-07-21 | Discharge: 2023-07-21 | Disposition: A | Discharge status: Home / Self Care | Payer: Medicare Other | Attending: Optometry | Admitting: Optometry

## 2023-07-21 ENCOUNTER — Encounter (HOSPITAL_COMMUNITY)
Admission: RE | Disposition: A | Discharge status: Home / Self Care | Payer: Self-pay | Source: Home / Self Care | Attending: Optometry

## 2023-07-21 ENCOUNTER — Other Ambulatory Visit: Payer: Self-pay

## 2023-07-21 ENCOUNTER — Ambulatory Visit (HOSPITAL_COMMUNITY): Payer: Medicare Other | Admitting: Certified Registered"

## 2023-07-21 DIAGNOSIS — H02051 Trichiasis without entropian right upper eyelid: Secondary | ICD-10-CM | POA: Diagnosis not present

## 2023-07-21 DIAGNOSIS — H2511 Age-related nuclear cataract, right eye: Secondary | ICD-10-CM

## 2023-07-21 DIAGNOSIS — I10 Essential (primary) hypertension: Secondary | ICD-10-CM | POA: Insufficient documentation

## 2023-07-21 DIAGNOSIS — H5203 Hypermetropia, bilateral: Secondary | ICD-10-CM | POA: Insufficient documentation

## 2023-07-21 DIAGNOSIS — Z8673 Personal history of transient ischemic attack (TIA), and cerebral infarction without residual deficits: Secondary | ICD-10-CM | POA: Insufficient documentation

## 2023-07-21 DIAGNOSIS — Z87891 Personal history of nicotine dependence: Secondary | ICD-10-CM | POA: Insufficient documentation

## 2023-07-21 DIAGNOSIS — H04123 Dry eye syndrome of bilateral lacrimal glands: Secondary | ICD-10-CM | POA: Insufficient documentation

## 2023-07-21 DIAGNOSIS — E039 Hypothyroidism, unspecified: Secondary | ICD-10-CM | POA: Diagnosis not present

## 2023-07-21 SURGERY — CATARACT EXTRACTION PHACO AND INTRAOCULAR LENS PLACEMENT (IOC) with placement of Corticosteroid
Anesthesia: Monitor Anesthesia Care | Site: Eye | Laterality: Right

## 2023-07-21 MED ORDER — BSS IO SOLN
INTRAOCULAR | Status: DC | PRN
  Administered 2023-07-21: 15 mL via INTRAOCULAR

## 2023-07-21 MED ORDER — POVIDONE-IODINE 5 % OP SOLN
OPHTHALMIC | Status: DC | PRN
  Administered 2023-07-21: 1 via OPHTHALMIC

## 2023-07-21 MED ORDER — SIGHTPATH DOSE#1 NA HYALUR & NA CHOND-NA HYALUR IO KIT
PACK | INTRAOCULAR | Status: DC | PRN
  Administered 2023-07-21: 1 via OPHTHALMIC

## 2023-07-21 MED ORDER — MOXIFLOXACIN HCL 5 MG/ML IO SOLN
INTRAOCULAR | Status: DC | PRN
  Administered 2023-07-21: .2 mL via INTRACAMERAL

## 2023-07-21 MED ORDER — MIDAZOLAM HCL 2 MG/2ML IJ SOLN
INTRAMUSCULAR | Status: DC | PRN
  Administered 2023-07-21: 1 mg via INTRAVENOUS

## 2023-07-21 MED ORDER — PHENYLEPHRINE HCL 2.5 % OP SOLN
1.0000 [drp] | OPHTHALMIC | Status: AC
  Administered 2023-07-21 (×3): 1 [drp] via OPHTHALMIC

## 2023-07-21 MED ORDER — STERILE WATER FOR IRRIGATION IR SOLN
Status: DC | PRN
  Administered 2023-07-21: 1000 mL

## 2023-07-21 MED ORDER — TETRACAINE HCL 0.5 % OP SOLN
1.0000 [drp] | OPHTHALMIC | Status: AC
  Administered 2023-07-21 (×3): 1 [drp] via OPHTHALMIC

## 2023-07-21 MED ORDER — MIDAZOLAM HCL 2 MG/2ML IJ SOLN
INTRAMUSCULAR | Status: AC
  Filled 2023-07-21: qty 2

## 2023-07-21 MED ORDER — LIDOCAINE HCL (PF) 1 % IJ SOLN
INTRAMUSCULAR | Status: DC | PRN
  Administered 2023-07-21: 1 mL

## 2023-07-21 MED ORDER — LIDOCAINE HCL 3.5 % OP GEL
1.0000 | Freq: Once | OPHTHALMIC | Status: AC
  Administered 2023-07-21: 1 via OPHTHALMIC

## 2023-07-21 MED ORDER — SODIUM CHLORIDE 0.9% FLUSH
INTRAVENOUS | Status: DC | PRN
  Administered 2023-07-21: 5 mL via INTRAVENOUS

## 2023-07-21 MED ORDER — FENTANYL CITRATE (PF) 100 MCG/2ML IJ SOLN
INTRAMUSCULAR | Status: AC
  Filled 2023-07-21: qty 2

## 2023-07-21 MED ORDER — PHENYLEPHRINE-KETOROLAC 1-0.3 % IO SOLN
INTRAOCULAR | Status: DC | PRN
  Administered 2023-07-21: 500 mL via OPHTHALMIC

## 2023-07-21 MED ORDER — TROPICAMIDE 1 % OP SOLN
1.0000 [drp] | OPHTHALMIC | Status: AC
  Administered 2023-07-21 (×3): 1 [drp] via OPHTHALMIC

## 2023-07-21 SURGICAL SUPPLY — 14 items
CATARACT SUITE SIGHTPATH (MISCELLANEOUS) ×1
CLOTH BEACON ORANGE TIMEOUT ST (SAFETY) ×2 IMPLANT
DRSG TEGADERM 4X4.75 (GAUZE/BANDAGES/DRESSINGS) ×2 IMPLANT
EYE SHIELD UNIVERSAL CLEAR (GAUZE/BANDAGES/DRESSINGS) IMPLANT
FEE CATARACT SUITE SIGHTPATH (MISCELLANEOUS) ×2 IMPLANT
GLOVE BIOGEL PI IND STRL 7.0 (GLOVE) ×4 IMPLANT
LENS IOL TECNIS EYHANCE 21.5 (Intraocular Lens) IMPLANT
NDL HYPO 18GX1.5 BLUNT FILL (NEEDLE) ×2 IMPLANT
NEEDLE HYPO 18GX1.5 BLUNT FILL (NEEDLE) ×1
PAD ARMBOARD 7.5X6 YLW CONV (MISCELLANEOUS) ×2 IMPLANT
POSITIONER HEAD 8X9X4 ADT (SOFTGOODS) ×2 IMPLANT
SYR TB 1ML LL NO SAFETY (SYRINGE) ×2 IMPLANT
TAPE SURG TRANSPORE 1 IN (GAUZE/BANDAGES/DRESSINGS) IMPLANT
WATER STERILE IRR 250ML POUR (IV SOLUTION) ×2 IMPLANT

## 2023-07-21 NOTE — Transfer of Care (Addendum)
Immediate Anesthesia Transfer of Care Note  Patient: Melissa Knight  Procedure(s) Performed: CATARACT EXTRACTION PHACO AND INTRAOCULAR LENS PLACEMENT (IOC) (Right: Eye)  Patient Location: Short Stay  Anesthesia Type:MAC  Level of Consciousness: awake, alert , and oriented  Airway & Oxygen Therapy: Patient Spontanous Breathing  Post-op Assessment: Report given to RN and Post -op Vital signs reviewed and stable  Post vital signs: Reviewed and stable  Last Vitals:  Vitals Value Taken Time  BP 155/80   Temp 36.5   Pulse 62   Resp 18   SpO2 99%     Last Pain:  Vitals:   07/21/23 0752  TempSrc: Oral  PainSc: 0-No pain         Complications: No notable events documented.

## 2023-07-21 NOTE — Anesthesia Procedure Notes (Signed)
Procedure Name: MAC Date/Time: 07/21/2023 8:44 AM  Performed by: Julian Reil, CRNAPre-anesthesia Checklist: Patient identified, Emergency Drugs available, Suction available and Patient being monitored Patient Re-evaluated:Patient Re-evaluated prior to induction Oxygen Delivery Method: Nasal cannula Placement Confirmation: positive ETCO2

## 2023-07-21 NOTE — Anesthesia Postprocedure Evaluation (Signed)
Anesthesia Post Note  Patient: Melissa Knight  Procedure(s) Performed: CATARACT EXTRACTION PHACO AND INTRAOCULAR LENS PLACEMENT (IOC) (Right: Eye)  Patient location during evaluation: Phase II Anesthesia Type: MAC Level of consciousness: awake Pain management: pain level controlled Vital Signs Assessment: post-procedure vital signs reviewed and stable Respiratory status: spontaneous breathing and respiratory function stable Cardiovascular status: blood pressure returned to baseline and stable Postop Assessment: no headache and no apparent nausea or vomiting Anesthetic complications: no Comments: Late entry   No notable events documented.   Last Vitals:  Vitals:   07/21/23 0752 07/21/23 0905  BP: (!) 178/90 (!) 155/80  Pulse: (!) 55 62  Resp: 16 18  Temp: 36.4 C 36.5 C  SpO2: 99% 99%    Last Pain:  Vitals:   07/21/23 0905  TempSrc: Oral  PainSc: 0-No pain                 Windell Norfolk

## 2023-07-21 NOTE — Anesthesia Preprocedure Evaluation (Signed)
Anesthesia Evaluation  Patient identified by MRN, date of birth, ID band Patient awake    Reviewed: Allergy & Precautions, H&P , NPO status , Patient's Chart, lab work & pertinent test results, reviewed documented beta blocker date and time   History of Anesthesia Complications (+) PONV and history of anesthetic complications  Airway Mallampati: II  TM Distance: >3 FB Neck ROM: full    Dental no notable dental hx.    Pulmonary neg pulmonary ROS, former smoker   Pulmonary exam normal breath sounds clear to auscultation       Cardiovascular Exercise Tolerance: Good hypertension, negative cardio ROS  Rhythm:regular Rate:Normal     Neuro/Psych Seizures -,  CVA negative neurological ROS  negative psych ROS   GI/Hepatic negative GI ROS, Neg liver ROS,,,  Endo/Other  negative endocrine ROSHypothyroidism    Renal/GU negative Renal ROS  negative genitourinary   Musculoskeletal   Abdominal   Peds  Hematology negative hematology ROS (+)   Anesthesia Other Findings   Reproductive/Obstetrics negative OB ROS                             Anesthesia Physical Anesthesia Plan  ASA: 3  Anesthesia Plan: MAC   Post-op Pain Management:    Induction:   PONV Risk Score and Plan:   Airway Management Planned:   Additional Equipment:   Intra-op Plan:   Post-operative Plan:   Informed Consent: I have reviewed the patients History and Physical, chart, labs and discussed the procedure including the risks, benefits and alternatives for the proposed anesthesia with the patient or authorized representative who has indicated his/her understanding and acceptance.     Dental Advisory Given  Plan Discussed with: CRNA  Anesthesia Plan Comments:        Anesthesia Quick Evaluation

## 2023-07-21 NOTE — Discharge Instructions (Signed)
Please discharge patient when stable, will follow up today with Dr. Tod Abrahamsen at the [facility] [place] office immediately following discharge.  Leave shield in place until visit.  All paperwork with discharge instructions will be given at the office.  [facility] Address:  [address]  [place], [place] [address]  Dr. Feliciano Wynter's Phone: [phone]  

## 2023-07-21 NOTE — Op Note (Signed)
Date of procedure: 07/21/23  Pre-operative diagnosis: Visually significant age-related nuclear cataract, Right Eye (H25.11)  Post-operative diagnosis: Visually significant age-related nuclear cataract, Right Eye  Procedure: Removal of cataract via phacoemulsification and insertion of intra-ocular lens J&J DIBOO +21.5D into the capsular bag of the Right Eye  Attending surgeon: Pecolia Ades, MD  Anesthesia: MAC, Topical Akten  Complications: None  Estimated Blood Loss: <88mL (minimal)  Specimens: None  Implants:  Implant Name Type Inv. Item Serial No. Manufacturer Lot No. LRB No. Used Action  LENS IOL TECNIS EYHANCE 21.5 - [id number] Intraocular Lens LENS IOL TECNIS EYHANCE 21.5 [id number] [employer]IGHTPATH  Right 1 Implanted    Indications:  Visually significant age-related cataract, Right Eye  Procedure:  The patient was seen and identified in the pre-operative area. The operative eye was identified and dilated.  The operative eye was marked.  Topical anesthesia was administered to the operative eye.     The patient was then to the operative suite and placed in the supine position.  A timeout was performed confirming the patient, procedure to be performed, and all other relevant information.   The patient's face was prepped and draped in the usual fashion for intra-ocular surgery.  A lid speculum was placed into the operative eye and the surgical microscope moved into place and focused.  A superotemporal paracentesis was created using a 20 gauge paracentesis blade.  BSS mixed with Omidria, followed by 1% lidocaine was injected into the anterior chamber.  Viscoelastic was injected into the anterior chamber.  A temporal clear-corneal main wound incision was created using a 2.19mm microkeratome.  A continuous curvilinear capsulorrhexis was initiated using an irrigating cystitome and completed using capsulorrhexis forceps.  Hydrodissection and hydrodeliniation were performed.  Viscoelastic was  injected into the anterior chamber.  A phacoemulsification handpiece and a chopper as a second instrument were used to remove the nucleus and epinucleus. The irrigation/aspiration handpiece was used to remove any remaining cortical material.   The capsular bag was reinflated with viscoelastic, checked, and found to be intact.  The intraocular lens was inserted into the capsular bag.  The irrigation/aspiration handpiece was used to remove any remaining viscoelastic.  The clear corneal wound and paracentesis wounds were then hydrated and checked with Weck-Cels to be watertight. Moxifloxacin was instilled into the anterior chamber.  The lid-speculum and drape was removed, and the patient's face was cleaned with a wet and dry 4x4. A clear shield was taped over the eye. The patient was taken to the post-operative care unit in good condition, having tolerated the procedure well.  Post-Op Instructions: The patient will follow up at [facility] for a same day post-operative evaluation and will receive all other orders and instructions.

## 2023-07-21 NOTE — Interval H&P Note (Signed)
History and Physical Interval Note:  07/21/2023 8:13 AM  The H and P was reviewed and updated. The patient was examined.  No changes were found after exam.  The surgical eye was marked.  Melissa Knight

## 2023-07-28 DIAGNOSIS — H2512 Age-related nuclear cataract, left eye: Secondary | ICD-10-CM | POA: Diagnosis not present

## 2023-08-01 ENCOUNTER — Encounter: Payer: Self-pay | Admitting: Nurse Practitioner

## 2023-08-01 ENCOUNTER — Ambulatory Visit (INDEPENDENT_AMBULATORY_CARE_PROVIDER_SITE_OTHER): Payer: Medicare Other | Admitting: Nurse Practitioner

## 2023-08-01 VITALS — BP 139/74 | HR 68 | Temp 96.8°F | Resp 20 | Ht 62.0 in | Wt 168.1 lb

## 2023-08-01 DIAGNOSIS — I4821 Permanent atrial fibrillation: Secondary | ICD-10-CM

## 2023-08-01 LAB — COAGUCHEK XS/INR WAIVED
INR: 2.1 — ABNORMAL HIGH (ref 0.9–1.1)
Prothrombin Time: 25.7 s

## 2023-08-01 NOTE — Progress Notes (Signed)
Subjective:   Chief complaint: INR recheck   Indication: atrial fibrillation Bleeding signs/symptoms: None Thromboembolic signs/symptoms: None  Missed Coumadin doses: None Medication changes: no Dietary changes: no Bacterial/viral infection: no Other concerns: no  The following portions of the patient's history were reviewed and updated as appropriate: allergies, current medications, past family history, past medical history, past social history, past surgical history, and problem list.  Review of Systems Pertinent items are noted in HPI.   Objective:    INR Today: 2.1 Current dose:  coumadin 3.75mg  except 7.5mg  on thursday   Assessment:    Therapeutic INR for goal of 2-3   Plan:    1. New dose: no change   2. Next INR: 1 month  Mary-Margaret Daphine Deutscher, FNP

## 2023-08-02 ENCOUNTER — Encounter (HOSPITAL_COMMUNITY)
Admission: RE | Admit: 2023-08-02 | Discharge: 2023-08-02 | Disposition: A | Discharge status: Home / Self Care | Payer: Medicare Other | Source: Ambulatory Visit | Attending: Optometry | Admitting: Optometry

## 2023-08-02 NOTE — H&P (Signed)
Surgical History & Physical  Patient Name: Melissa Knight  DOB: [birth year]  Surgery: Cataract extraction with intraocular lens implant phacoemulsification; Left Eye Surgeon: Pecolia Ades MD Surgery Date: 08/04/2023 Pre-Op Date: 07/26/2023  HPI: A 81 Yr. old female patient Present for 5 day Post-op OD/Pre-op OS. Vision has improved on the OD eye. No discomfort. Patient is having trouble seeing fine print when reading. She is scheduled to get cat sx in OS. Is compliant with medication . HPI was performed by Pecolia Ades .  Medical History: Cataracts  Cancer Heart Problem High Blood Pressure LDL Stroke Thyroid Problems hx seizure  Review of Systems Cardiovascular High Blood Pressure, A-fib Endocrine thyroid disease Neurological Stroke All recorded systems are negative except as noted above.  Social Former smoker   Medication folic acid ,  atorvastatin , Prednisolon-moxiflox-bromf(pf),  warfarin ,  metoprolol succinate ,  lisinopril ,  levetiracetam   Sx/Procedures Phaco c IOL OD,  Bladder Sx, Skin Ca Nose, Tubal Ligation  Drug Allergies  Sulfa (Sulfonamide Antibiotics)   History & Physical: Heent: cataract NECK: supple without bruits LUNGS: lungs clear to auscultation CV: regular rate and rhythm Abdomen: soft and non-tender  Impression & Plan: Assessment: 1.  CATARACT AGE-RELATED COMBINED FORMS; Both Eyes (H25.813) 2.  CATARACT EXTRACTION STATUS; Right Eye (Z98.41) 3.  INTRAOCULAR LENS IOL (Z96.1)  Plan: 1.  Cataracts are visually significant and account for the patient's complaints. Discussed all risks, benefits, procedures and recovery, including infection, loss of vision and eye, need for glasses after surgery or additional procedures. Patient understands changing glasses will not improve vision. Patient indicated understanding of procedure. All questions answered. Patient desires to have surgery, recommend phacoemulsification with intraocular lens.  Patient to have preliminary testing necessary ([employer]os/IOL Master, Mac OCT, TOPO) Educational materials provided:Cataract. RECOMMEND STANDARD VS EYEHANCE VS TORIC VS VIVTY.  Plan: - Proceed with cataract surgery OS when ready - DIB00 lens with best distance target - Dextenza implant - ok with lying flat, no prior eye surgery - No DM, no fuchs  2.  POD5 exam, doing well. All post-op precautions discussed &instructions reviewed. Written instructions given  Healing well, still with some remaining edema  3.  Clear visual axis

## 2023-08-04 ENCOUNTER — Ambulatory Visit (HOSPITAL_COMMUNITY): Payer: Medicare Other | Admitting: Anesthesiology

## 2023-08-04 ENCOUNTER — Encounter (HOSPITAL_COMMUNITY)
Admission: RE | Disposition: A | Discharge status: Home / Self Care | Payer: Self-pay | Source: Home / Self Care | Attending: Optometry

## 2023-08-04 ENCOUNTER — Encounter (HOSPITAL_COMMUNITY): Payer: Self-pay | Admitting: Optometry

## 2023-08-04 ENCOUNTER — Ambulatory Visit (HOSPITAL_COMMUNITY)
Admission: RE | Admit: 2023-08-04 | Discharge: 2023-08-04 | Disposition: A | Discharge status: Home / Self Care | Payer: Medicare Other | Attending: Optometry | Admitting: Optometry

## 2023-08-04 DIAGNOSIS — Z961 Presence of intraocular lens: Secondary | ICD-10-CM | POA: Insufficient documentation

## 2023-08-04 DIAGNOSIS — H2512 Age-related nuclear cataract, left eye: Secondary | ICD-10-CM | POA: Diagnosis not present

## 2023-08-04 DIAGNOSIS — I10 Essential (primary) hypertension: Secondary | ICD-10-CM | POA: Diagnosis not present

## 2023-08-04 DIAGNOSIS — Z9841 Cataract extraction status, right eye: Secondary | ICD-10-CM | POA: Insufficient documentation

## 2023-08-04 DIAGNOSIS — E8881 Metabolic syndrome: Secondary | ICD-10-CM

## 2023-08-04 DIAGNOSIS — Z8673 Personal history of transient ischemic attack (TIA), and cerebral infarction without residual deficits: Secondary | ICD-10-CM | POA: Insufficient documentation

## 2023-08-04 DIAGNOSIS — E039 Hypothyroidism, unspecified: Secondary | ICD-10-CM | POA: Diagnosis not present

## 2023-08-04 DIAGNOSIS — Z87891 Personal history of nicotine dependence: Secondary | ICD-10-CM | POA: Insufficient documentation

## 2023-08-04 HISTORY — PX: CATARACT EXTRACTION W/PHACO: SHX586

## 2023-08-04 SURGERY — PHACOEMULSIFICATION, CATARACT, WITH IOL INSERTION
Anesthesia: Monitor Anesthesia Care | Site: Eye | Laterality: Left

## 2023-08-04 MED ORDER — ORAL CARE MOUTH RINSE
15.0000 mL | Freq: Once | OROMUCOSAL | Status: DC
Start: 2023-08-04 — End: 2023-08-04

## 2023-08-04 MED ORDER — MIDAZOLAM HCL 2 MG/2ML IJ SOLN
INTRAMUSCULAR | Status: AC
  Filled 2023-08-04: qty 2

## 2023-08-04 MED ORDER — BSS IO SOLN
INTRAOCULAR | Status: DC | PRN
  Administered 2023-08-04: 15 mL via INTRAOCULAR

## 2023-08-04 MED ORDER — SODIUM CHLORIDE 0.9% FLUSH: 10.0000 mL | Freq: Two times a day (BID) | INTRAVENOUS | Status: DC

## 2023-08-04 MED ORDER — POVIDONE-IODINE 5 % OP SOLN
OPHTHALMIC | Status: DC | PRN
  Administered 2023-08-04: 1 via OPHTHALMIC

## 2023-08-04 MED ORDER — SIGHTPATH DOSE#1 NA HYALUR & NA CHOND-NA HYALUR IO KIT
PACK | INTRAOCULAR | Status: DC | PRN
  Administered 2023-08-04: 1 via OPHTHALMIC

## 2023-08-04 MED ORDER — STERILE WATER FOR IRRIGATION IR SOLN
Status: DC | PRN
  Administered 2023-08-04: 250 mL

## 2023-08-04 MED ORDER — CHLORHEXIDINE GLUCONATE 0.12 % MT SOLN
15.0000 mL | Freq: Once | OROMUCOSAL | Status: DC
Start: 2023-08-04 — End: 2023-08-04

## 2023-08-04 MED ORDER — TETRACAINE HCL 0.5 % OP SOLN
1.0000 [drp] | OPHTHALMIC | Status: AC | PRN
  Administered 2023-08-04 (×3): 1 [drp] via OPHTHALMIC

## 2023-08-04 MED ORDER — LIDOCAINE HCL (PF) 1 % IJ SOLN
INTRAMUSCULAR | Status: DC | PRN
  Administered 2023-08-04: 1 mL

## 2023-08-04 MED ORDER — SODIUM CHLORIDE 0.9% FLUSH
INTRAVENOUS | Status: DC | PRN
  Administered 2023-08-04: 5 mL via INTRAVENOUS

## 2023-08-04 MED ORDER — SODIUM CHLORIDE 0.9% FLUSH
10.0000 mL | Freq: Two times a day (BID) | INTRAVENOUS | Status: DC
Start: 2023-08-04 — End: 2023-08-04

## 2023-08-04 MED ORDER — MOXIFLOXACIN HCL 5 MG/ML IO SOLN
INTRAOCULAR | Status: DC | PRN
  Administered 2023-08-04: .3 mL via INTRACAMERAL

## 2023-08-04 MED ORDER — LIDOCAINE HCL 3.5 % OP GEL
1.0000 | Freq: Once | OPHTHALMIC | Status: AC
  Administered 2023-08-04: 1 via OPHTHALMIC

## 2023-08-04 MED ORDER — MIDAZOLAM HCL 2 MG/2ML IJ SOLN
INTRAMUSCULAR | Status: DC | PRN
  Administered 2023-08-04: 1 mg via INTRAVENOUS

## 2023-08-04 MED ORDER — PHENYLEPHRINE HCL 2.5 % OP SOLN
1.0000 [drp] | OPHTHALMIC | Status: AC | PRN
  Administered 2023-08-04 (×3): 1 [drp] via OPHTHALMIC

## 2023-08-04 MED ORDER — PHENYLEPHRINE-KETOROLAC 1-0.3 % IO SOLN
INTRAOCULAR | Status: DC | PRN
  Administered 2023-08-04: 500 mL via OPHTHALMIC

## 2023-08-04 MED ORDER — TROPICAMIDE 1 % OP SOLN
1.0000 [drp] | OPHTHALMIC | Status: AC | PRN
  Administered 2023-08-04 (×3): 1 [drp] via OPHTHALMIC

## 2023-08-04 SURGICAL SUPPLY — 15 items
CATARACT SUITE SIGHTPATH (MISCELLANEOUS) ×1 IMPLANT
CLOTH BEACON ORANGE TIMEOUT ST (SAFETY) ×2 IMPLANT
DRSG TEGADERM 4X4.75 (GAUZE/BANDAGES/DRESSINGS) ×2 IMPLANT
EYE SHIELD UNIVERSAL CLEAR (GAUZE/BANDAGES/DRESSINGS) IMPLANT
FEE CATARACT SUITE SIGHTPATH (MISCELLANEOUS) ×2 IMPLANT
GLOVE BIOGEL PI IND STRL 7.0 (GLOVE) ×4 IMPLANT
LENS IOL TECNIS EYHANCE 18.0 (Intraocular Lens) IMPLANT
NDL HYPO 18GX1.5 BLUNT FILL (NEEDLE) ×2 IMPLANT
NEEDLE HYPO 18GX1.5 BLUNT FILL (NEEDLE) ×1 IMPLANT
PAD ARMBOARD 7.5X6 YLW CONV (MISCELLANEOUS) ×2 IMPLANT
POSITIONER HEAD 8X9X4 ADT (SOFTGOODS) ×2 IMPLANT
RING MALYGIN 7.0 (MISCELLANEOUS) IMPLANT
SYR TB 1ML LL NO SAFETY (SYRINGE) ×2 IMPLANT
TAPE SURG TRANSPORE 1 IN (GAUZE/BANDAGES/DRESSINGS) IMPLANT
WATER STERILE IRR 250ML POUR (IV SOLUTION) ×2 IMPLANT

## 2023-08-04 NOTE — Transfer of Care (Addendum)
Immediate Anesthesia Transfer of Care Note  Patient: Melissa Knight  Procedure(s) Performed: CATARACT EXTRACTION PHACO AND INTRAOCULAR LENS PLACEMENT (IOC) (Left: Eye)  Patient Location: Short Stay  Anesthesia Type:MAC  Level of Consciousness: awake and patient cooperative  Airway & Oxygen Therapy: Patient Spontanous Breathing  Post-op Assessment: Report given to RN and Post -op Vital signs reviewed and stable  Post vital signs: Reviewed and stable  Last Vitals:  Vitals Value Taken Time  BP 178/84 08/04/23   1154  Temp 36.7 08/04/23   1154  Pulse 54 08/04/23   1154  Resp 16 08/04/23   1154  SpO2 100% 08/04/23   1154    Last Pain:  Vitals:   08/04/23 1016  PainSc: 0-No pain         Complications: No notable events documented.

## 2023-08-04 NOTE — Anesthesia Postprocedure Evaluation (Signed)
Anesthesia Post Note  Patient: Melissa Knight  Procedure(s) Performed: CATARACT EXTRACTION PHACO AND INTRAOCULAR LENS PLACEMENT (IOC) (Left: Eye)  Patient location during evaluation: Phase II Anesthesia Type: MAC Level of consciousness: awake Pain management: pain level controlled Vital Signs Assessment: post-procedure vital signs reviewed and stable Respiratory status: spontaneous breathing and respiratory function stable Cardiovascular status: blood pressure returned to baseline and stable Postop Assessment: no headache and no apparent nausea or vomiting Anesthetic complications: no Comments: Late entry   No notable events documented.   Last Vitals:  Vitals:   08/04/23 1017 08/04/23 1154  BP: (!) 165/88 (!) 178/84  Pulse: (!) 53   Resp: 16 16  Temp: 36.7 C 36.7 C  SpO2: 99% 100%    Last Pain:  Vitals:   08/04/23 1154  TempSrc: Oral  PainSc: 0-No pain                 Windell Norfolk

## 2023-08-04 NOTE — Anesthesia Preprocedure Evaluation (Signed)
Anesthesia Evaluation  Patient identified by MRN, date of birth, ID band Patient awake    Reviewed: Allergy & Precautions, H&P , NPO status , Patient's Chart, lab work & pertinent test results, reviewed documented beta blocker date and time   History of Anesthesia Complications (+) PONV and history of anesthetic complications  Airway Mallampati: II  TM Distance: >3 FB Neck ROM: full    Dental no notable dental hx.    Pulmonary neg pulmonary ROS, former smoker   Pulmonary exam normal breath sounds clear to auscultation       Cardiovascular Exercise Tolerance: Good hypertension, negative cardio ROS  Rhythm:regular Rate:Normal     Neuro/Psych Seizures -,  CVA negative neurological ROS  negative psych ROS   GI/Hepatic negative GI ROS, Neg liver ROS,,,  Endo/Other  negative endocrine ROSHypothyroidism    Renal/GU negative Renal ROS  negative genitourinary   Musculoskeletal   Abdominal   Peds  Hematology negative hematology ROS (+)   Anesthesia Other Findings   Reproductive/Obstetrics negative OB ROS                             Anesthesia Physical Anesthesia Plan  ASA: 3  Anesthesia Plan: MAC   Post-op Pain Management:    Induction:   PONV Risk Score and Plan:   Airway Management Planned:   Additional Equipment:   Intra-op Plan:   Post-operative Plan:   Informed Consent: I have reviewed the patients History and Physical, chart, labs and discussed the procedure including the risks, benefits and alternatives for the proposed anesthesia with the patient or authorized representative who has indicated his/her understanding and acceptance.     Dental Advisory Given  Plan Discussed with: CRNA  Anesthesia Plan Comments:        Anesthesia Quick Evaluation

## 2023-08-04 NOTE — Interval H&P Note (Signed)
History and Physical Interval Note:  08/04/2023 11:03 AM  The H and P was reviewed and updated. The patient was examined.  No changes were found after exam.  The surgical eye was marked.   Melissa Knight

## 2023-08-04 NOTE — Op Note (Signed)
Date of procedure: 08/04/23  Pre-operative diagnosis: Visually significant age-related nuclear cataract, Left Eye (H25.12)  Post-operative diagnosis: Visually significant age-related nuclear cataract, Left Eye  Procedure: Removal of cataract via phacoemulsification and insertion of intra-ocular lens J&J DIB00 +18.0D into the capsular bag of the Left Eye  Attending surgeon: Ronal Fear, MD  Anesthesia: MAC, Topical Akten  Complications: None  Estimated Blood Loss: <44mL (minimal)  Specimens: None  Implants:  Implant Name Type Inv. Item Serial No. Manufacturer Lot No. LRB No. Used Action  LENS IOL TECNIS EYHANCE 18.0 - [id number] Intraocular Lens LENS IOL TECNIS EYHANCE 18.0 [id number] [employer]  Left 1 Implanted    Indications:  Visually significant age-related cataract, Left Eye  Procedure:  The patient was seen and identified in the pre-operative area. The operative eye was identified and dilated.  The operative eye was marked.  Topical anesthesia was administered to the operative eye.     The patient was then to the operative suite and placed in the supine position.  A timeout was performed confirming the patient, procedure to be performed, and all other relevant information.   The patient's face was prepped and draped in the usual fashion for intra-ocular surgery.  A lid speculum was placed into the operative eye and the surgical microscope moved into place and focused.  An inferotemporal paracentesis was created using a 20 gauge paracentesis blade.  BSS mixed with Omidria, followed by 1% lidocaine was injected into the anterior chamber.  Viscoelastic was injected into the anterior chamber.  A temporal clear-corneal main wound incision was created using a 2.46mm microkeratome.  A continuous curvilinear capsulorrhexis was initiated using an irrigating cystitome and completed using capsulorrhexis forceps.  Hydrodissection and hydrodeliniation were performed.  Viscoelastic was  injected into the anterior chamber.  A phacoemulsification handpiece and a chopper as a second instrument were used to remove the nucleus and epinucleus. The irrigation/aspiration handpiece was used to remove any remaining cortical material.   The capsular bag was reinflated with viscoelastic, checked, and found to be intact.  The intraocular lens was inserted into the capsular bag.  The irrigation/aspiration handpiece was used to remove any remaining viscoelastic.  The clear corneal wound and paracentesis wounds were then hydrated and checked with Weck-Cels to be watertight. Moxifloxacin was instilled into the anterior chamber.  The lid-speculum and drape was removed, and the patient's face was cleaned with a wet and dry 4x4.  A clear shield was taped over the eye. The patient was taken to the post-operative care unit in good condition, having tolerated the procedure well.  Post-Op Instructions: The patient will follow up at [facility] for a same day post-operative evaluation and will receive all other orders and instructions.

## 2023-08-04 NOTE — Discharge Instructions (Signed)
Please discharge patient when stable, will follow up today with Dr. Tod Abrahamsen at the [facility] [place] office immediately following discharge.  Leave shield in place until visit.  All paperwork with discharge instructions will be given at the office.  [facility] Address:  [address]  [place], [place] [address]  Dr. Feliciano Wynter's Phone: [phone]  

## 2023-08-07 ENCOUNTER — Encounter (HOSPITAL_COMMUNITY): Payer: Self-pay | Admitting: Optometry

## 2023-08-29 ENCOUNTER — Ambulatory Visit (INDEPENDENT_AMBULATORY_CARE_PROVIDER_SITE_OTHER): Payer: Medicare Other | Admitting: Nurse Practitioner

## 2023-08-29 ENCOUNTER — Encounter: Payer: Self-pay | Admitting: Nurse Practitioner

## 2023-08-29 VITALS — BP 147/95 | HR 59 | Temp 97.6°F | Ht 62.0 in | Wt 167.0 lb

## 2023-08-29 DIAGNOSIS — I4821 Permanent atrial fibrillation: Secondary | ICD-10-CM | POA: Diagnosis not present

## 2023-08-29 LAB — COAGUCHEK XS/INR WAIVED
INR: 2.1 — ABNORMAL HIGH (ref 0.9–1.1)
Prothrombin Time: 25.1 s

## 2023-08-29 NOTE — Progress Notes (Signed)
Subjective:   Chief Complaint: INR recheck   Indication: atrial fibrillation Bleeding signs/symptoms: None Thromboembolic signs/symptoms: None  Missed Coumadin doses: None Medication changes: no Dietary changes: no Bacterial/viral infection: no Other concerns: no  The following portions of the patient's history were reviewed and updated as appropriate: allergies, current medications, past family history, past medical history, past social history, past surgical history, and problem list.  Review of Systems Pertinent items noted in HPI and remainder of comprehensive ROS otherwise negative.   Objective:    INR Today: 2.1 Current dose:  coumadin 3.75mg  except 7.5mg  on thursday   Assessment:      Therapeutic INR for goal of 2-3   Plan:    1. New dose: no change   2. Next INR: 1 month  Mary-Margaret Daphine Deutscher, FNP

## 2023-08-31 ENCOUNTER — Ambulatory Visit: Payer: Medicare Other

## 2023-08-31 VITALS — BP 130/74 | Ht 62.0 in | Wt 168.0 lb

## 2023-08-31 DIAGNOSIS — Z0000 Encounter for general adult medical examination without abnormal findings: Secondary | ICD-10-CM

## 2023-08-31 NOTE — Patient Instructions (Addendum)
Ms. Hofmann , Thank you for taking time to come for your Medicare Wellness Visit. I appreciate your ongoing commitment to your health goals. Please review the following plan we discussed and let me know if I can assist you in the future.   Referrals/Orders/Follow-Ups/Clinician Recommendations: Aim for 30 minutes of exercise or brisk walking, 6-8 glasses of water, and 5 servings of fruits and vegetables each day.  This is a list of the screening recommended for you and due dates:  Health Maintenance  Topic Date Due   Medicare Annual Wellness Visit  11/19/2019   COVID-19 Vaccine (4 - 2024-25 season) 09/14/2023*   DTaP/Tdap/Td vaccine (1 - Tdap) 07/31/2024*   DEXA scan (bone density measurement)  06/05/2025   Pneumonia Vaccine  Completed   Flu Shot  Completed   Zoster (Shingles) Vaccine  Completed   HPV Vaccine  Aged Out   Mammogram  Discontinued  *Topic was postponed. The date shown is not the original due date.    Advanced directives: (ACP Link)Information on Advanced Care Planning can be found at [facility] [facility] Advance Health Care Directives Advance Health Care Directives ([email or web address]   Next Medicare Annual Wellness Visit scheduled for next year: Yes

## 2023-08-31 NOTE — Progress Notes (Signed)
Subjective:   Melissa Knight is a 89 y.o. female who presents for Medicare Annual (Subsequent) preventive examination.  Visit Complete: In person  Cardiac Risk Factors include: advanced age (>62men, >90 women);hypertension;dyslipidemia;sedentary lifestyle     Objective:    Today's Vitals   08/31/23 1038  BP: 130/74  Weight: 168 lb (76.2 kg)  Height: 5\' 2"  (1.575 m)   Body mass index is 30.73 kg/m.     08/31/2023   11:45 AM 07/21/2023    7:47 AM 07/18/2023   10:03 AM 11/19/2018    3:02 PM 11/10/2016   10:33 AM 10/12/2015    2:10 PM 10/10/2014    2:29 PM  Advanced Directives  Does Patient Have a Medical Advance Directive? No No No No No No No  Would patient like information on creating a medical advance directive? Yes (MAU/Ambulatory/Procedural Areas - Information given)  No - Patient declined Yes (MAU/Ambulatory/Procedural Areas - Information given) Yes (MAU/Ambulatory/Procedural Areas - Information given) Yes - Educational materials given No - patient declined information    Current Medications (verified) Outpatient Encounter Medications as of 08/31/2023  Medication Sig   amLODipine (NORVASC) 2.5 MG tablet Take 1 tablet (2.5 mg total) by mouth daily.   atorvastatin (LIPITOR) 10 MG tablet Take 1 tablet (10 mg total) by mouth daily at 6 PM.   betamethasone dipropionate 0.05 % cream Apply daily as needed to affected area   calcium-vitamin D (OSCAL WITH D) 500-200 MG-UNIT tablet Take 1 tablet by mouth.   folic acid (FOLVITE) 1 MG tablet TAKE 1 TABLET BY MOUTH EVERY DAY   furosemide (LASIX) 20 MG tablet TAKE 1 TABLET EVERY DAY AS NEEDED   levETIRAcetam (KEPPRA) 500 MG tablet Take 1 tablet (500 mg total) by mouth daily.   levothyroxine (SYNTHROID) 88 MCG tablet TAKE 1 TABLET (88 MCG TOTAL) BY MOUTH DAILY.   lisinopril (ZESTRIL) 20 MG tablet TAKE 1 TABLET BY MOUTH TWICE A DAY   metoprolol tartrate (LOPRESSOR) 50 MG tablet TAKE 2 TABLETS BY MOUTH IN MORNING AND 1 TABLET BY MOUTH EVERY  NIGHT AT BEDTIME   TURMERIC PO Take by mouth.   warfarin (COUMADIN) 7.5 MG tablet Take 1 tablet (7.5 mg total) by mouth one time only at 6 PM.   No facility-administered encounter medications on file as of 08/31/2023.    Allergies (verified) Sulfa antibiotics   History: Past Medical History:  Diagnosis Date   Acute ischemic stroke (HCC) 06/12/2012   acute right parietal ischemic stroke - likely cardioembolic in setting of AFib => coumadin started and followed by PCP   Atrial fibrillation (HCC) 06/12/2012   Echocardiogram 06/30/12: EF 60-65%, mild MR, PASP 33.   Hyperlipidemia    Hypertension    Hypothyroidism    Lichen sclerosus et atrophicus of the vulva    PONV (postoperative nausea and vomiting)    Seizure (HCC) 06/12/2012   in the setting of acute ischemic stroke   Past Surgical History:  Procedure Laterality Date   BLADDER SURGERY     bladder prolapse   CATARACT EXTRACTION Left 07/2023   CATARACT EXTRACTION W/PHACO Left 08/04/2023   Procedure: CATARACT EXTRACTION PHACO AND INTRAOCULAR LENS PLACEMENT (IOC);  Surgeon: Pecolia Ades, MD;  Location: [facility];  Service: Ophthalmology;  Laterality: Left;  CDE: 6.75   TUBAL LIGATION     Family History  Problem Relation Age of Onset   Atrial fibrillation Mother        pacemaker   Hypertension Mother    Heart disease Sister  MI in 60s   Stroke Sister    Heart disease Father    Heart disease Sister    Heart disease Sister    Hypertension Sister    Anxiety disorder Sister    Heart disease Brother    Cancer Brother        pancreatic   Heart disease Son    Stroke Son 70   Social History   Socioeconomic History   Marital status: Married    Spouse name: Not on file   Number of children: 3   Years of education: 13   Highest education level: Some college, no degree  Occupational History   Occupation: retired    Comment: Clinical biochemist  Tobacco Use   Smoking status: Former    Current packs/day: 0.00     Types: Cigarettes    Quit date: 09/30/1978    Years since quitting: 44.9   Smokeless tobacco: Never  Vaping Use   Vaping status: Never Used  Substance and Sexual Activity   Alcohol use: No   Drug use: No   Sexual activity: Not on file  Other Topics Concern   Not on file  Social History Narrative   Not on file   Social Drivers of Health   Financial Resource Strain: Low Risk  (08/31/2023)   Overall Financial Resource Strain (CARDIA)    Difficulty of Paying Living Expenses: Not hard at all  Food Insecurity: No Food Insecurity (08/31/2023)   Hunger Vital Sign    Worried About Running Out of Food in the Last Year: Never true    Ran Out of Food in the Last Year: Never true  Transportation Needs: No Transportation Needs (08/31/2023)   PRAPARE - Administrator, Civil Service (Medical): No    Lack of Transportation (Non-Medical): No  Physical Activity: Inactive (08/31/2023)   Exercise Vital Sign    Days of Exercise per Week: 0 days    Minutes of Exercise per Session: 0 min  Stress: No Stress Concern Present (08/31/2023)   [employer] of Occupational Health - Occupational Stress Questionnaire    Feeling of Stress : Not at all  Social Connections: Moderately Integrated (08/31/2023)   Social Connection and Isolation Panel [NHANES]    Frequency of Communication with Friends and Family: More than three times a week    Frequency of Social Gatherings with Friends and Family: Three times a week    Attends Religious Services: More than 4 times per year    Active Member of Clubs or Organizations: No    Attends Banker Meetings: Never    Marital Status: Married    Tobacco Counseling Counseling given: Not Answered   Clinical Intake:  Pre-visit preparation completed: Yes  Pain : No/denies pain     Diabetes: No  How often do you need to have someone help you when you read instructions, pamphlets, or other written materials from your doctor or  pharmacy?: 1 - Never  Interpreter Needed?: No  Information entered by :: Kandis Fantasia LPN   Activities of Daily Living    08/31/2023   11:45 AM 07/18/2023   10:06 AM  In your present state of health, do you have any difficulty performing the following activities:  Hearing? 0   Vision? 0   Difficulty concentrating or making decisions? 0   Walking or climbing stairs? 1   Dressing or bathing? 0   Doing errands, shopping? 0 0  Preparing Food and eating ? N   Using  the Toilet? N   In the past six months, have you accidently leaked urine? N   Do you have problems with loss of bowel control? N   Managing your Medications? N   Managing your Finances? N   Housekeeping or managing your Housekeeping? N     Patient Care Team: Bennie Pierini, FNP as PCP - General (Nurse Practitioner) Tonny Bollman, MD as Consulting Physician (Cardiology) Kennon Rounds as Physician Assistant (Physician Assistant) Derryl Harbor, OD as Consulting Physician (Optometry)  Indicate any recent Medical Services you may have received from other than [facility] providers in the past year (date may be approximate).     Assessment:   This is a routine wellness examination for Melissa Knight.  Hearing/Vision screen Hearing Screening - Comments:: Denies hearing difficulties   Vision Screening - Comments:: Wears rx glasses - up to date with routine eye exams with [facility]    Goals Addressed             This Visit's Progress    Remain active and independent        Depression Screen    08/31/2023   11:40 AM 08/01/2023   12:14 PM 07/03/2023   12:47 PM 06/05/2023   12:21 PM 04/07/2023   12:48 PM 03/07/2023    1:50 PM 02/07/2023   12:05 PM  PHQ 2/9 Scores  PHQ - 2 Score 0 0 0 0 0 0 0  PHQ- 9 Score 0 0 0 0 0 0 0    Fall Risk    08/01/2023   12:14 PM 07/03/2023   12:47 PM 06/05/2023   12:21 PM 04/07/2023   12:47 PM 03/07/2023    1:50 PM  Fall Risk   Falls in the past year? 0 0 0 0 0   Follow up Falls evaluation completed        MEDICARE RISK AT HOME: Medicare Risk at Home Any stairs in or around the home?: No If so, are there any without handrails?: No Home free of loose throw rugs in walkways, pet beds, electrical cords, etc?: Yes Adequate lighting in your home to reduce risk of falls?: Yes Life alert?: No Use of a cane, walker or w/c?: Yes Grab bars in the bathroom?: Yes Shower chair or bench in shower?: Yes Elevated toilet seat or a handicapped toilet?: Yes  TIMED UP AND GO:  Was the test performed?  Yes  Length of time to ambulate 10 feet: 10 sec Gait steady and fast with assistive device    Cognitive Function:    11/19/2018    4:05 PM 11/19/2018    3:29 PM 11/14/2017   11:42 AM 11/10/2016   10:39 AM 10/12/2015    2:20 PM  MMSE - Mini Mental State Exam  Orientation to time 5 5 5 5 5   Orientation to Place 4  5 5 5   Registration 3  3 3 3   Attention/ Calculation 5  5 5 5   Recall 3  3 3 3   Language- name 2 objects 2  2 2 2   Language- repeat 1  1 1 1   Language- follow 3 step command 3  3 3 3   Language- read & follow direction 1  1 1 1   Write a sentence 1  1 1 1   Copy design 1  1 1 1   Total score 29  30 30 30         08/31/2023   11:45 AM  6CIT Screen  What Year? 0 points  What month? 0 points  What time? 0 points  Count back from 20 0 points  Months in reverse 0 points  Repeat phrase 0 points  Total Score 0 points    Immunizations Immunization History  Administered Date(s) Administered   Fluad Quad(high Dose 65+) 08/01/2019, 06/11/2020, 06/01/2021, 06/10/2022   Fluad Trivalent(High Dose 65+) 06/05/2023   Influenza Split 07/03/2012   Influenza, High Dose Seasonal PF 08/08/2017, 07/03/2018   Influenza,inj,Quad PF,6+ Mos 09/19/2013, 07/10/2014, 06/25/2015, 07/07/2016   Moderna SARS-COV2 Booster Vaccination 09/23/2020   Moderna Sars-Covid-2 Vaccination 10/18/2019, 11/16/2019   Pneumococcal Conjugate-13 07/10/2014   Pneumococcal  Polysaccharide-23 12/28/2010   Zoster Recombinant(Shingrix) 11/19/2018, 07/04/2019, 07/04/2019   Zoster, Live 01/08/2015    TDAP status: Due, Education has been provided regarding the importance of this vaccine. Advised may receive this vaccine at local pharmacy or Health Dept. Aware to provide a copy of the vaccination record if obtained from local pharmacy or Health Dept. Verbalized acceptance and understanding.  Flu Vaccine status: Up to date  Pneumococcal vaccine status: Up to date  Covid-19 vaccine status: Information provided on how to obtain vaccines.   Qualifies for Shingles Vaccine? Yes   Zostavax completed Yes   Shingrix Completed?: Yes  Screening Tests Health Maintenance  Topic Date Due   COVID-19 Vaccine (4 - 2024-25 season) 09/14/2023 (Originally 05/14/2023)   DTaP/Tdap/Td (1 - Tdap) 07/31/2024 (Originally 10/16/1951)   Medicare Annual Wellness (AWV)  08/30/2024   DEXA SCAN  06/05/2025   Pneumonia Vaccine 30+ Years old  Completed   INFLUENZA VACCINE  Completed   Zoster Vaccines- Shingrix  Completed   HPV VACCINES  Aged Out   MAMMOGRAM  Discontinued    Health Maintenance  There are no preventive care reminders to display for this patient.   Colorectal cancer screening: No longer required.   Mammogram status: No longer required due to age and preference .  Bone Density status: Completed 06/06/23. Results reflect: Bone density results: OSTEOPENIA. Repeat every 2 years.  Lung Cancer Screening: (Low Dose CT Chest recommended if Age 42-80 years, 20 pack-year currently smoking OR have quit w/in 15years.) does not qualify.   Lung Cancer Screening Referral: n/a  Additional Screening:  Hepatitis C Screening: does not qualify  Vision Screening: Recommended annual ophthalmology exams for early detection of glaucoma and other disorders of the eye. Is the patient up to date with their annual eye exam?  Yes  Who is the provider or what is the name of the office in which  the patient attends annual eye exams? [facility] If pt is not established with a provider, would they like to be referred to a provider to establish care? No .   Dental Screening: Recommended annual dental exams for proper oral hygiene  Community Resource Referral / Chronic Care Management: CRR required this visit?  No   CCM required this visit?  No     Plan:     I have personally reviewed and noted the following in the patient's chart:   Medical and social history Use of alcohol, tobacco or illicit drugs  Current medications and supplements including opioid prescriptions. Patient is not currently taking opioid prescriptions. Functional ability and status Nutritional status Physical activity Advanced directives List of other physicians Hospitalizations, surgeries, and ER visits in previous 12 months Vitals Screenings to include cognitive, depression, and falls Referrals and appointments  In addition, I have reviewed and discussed with patient certain preventive protocols, quality metrics, and best practice recommendations. A written personalized care plan  for preventive services as well as general preventive health recommendations were provided to patient.     Kandis Fantasia [place], [place]   [id number]   After Visit Summary: (In Person-Printed) AVS printed and given to the patient  Nurse Notes: No concerns at this time

## 2023-09-03 ENCOUNTER — Other Ambulatory Visit: Payer: Self-pay | Admitting: Nurse Practitioner

## 2023-09-03 DIAGNOSIS — I10 Essential (primary) hypertension: Secondary | ICD-10-CM

## 2023-09-03 DIAGNOSIS — G40319 Generalized idiopathic epilepsy and epileptic syndromes, intractable, without status epilepticus: Secondary | ICD-10-CM

## 2023-09-21 ENCOUNTER — Other Ambulatory Visit: Payer: Self-pay | Admitting: Nurse Practitioner

## 2023-09-21 DIAGNOSIS — I10 Essential (primary) hypertension: Secondary | ICD-10-CM

## 2023-09-21 DIAGNOSIS — G40319 Generalized idiopathic epilepsy and epileptic syndromes, intractable, without status epilepticus: Secondary | ICD-10-CM

## 2023-09-26 ENCOUNTER — Ambulatory Visit (INDEPENDENT_AMBULATORY_CARE_PROVIDER_SITE_OTHER): Payer: Medicare Other | Admitting: Nurse Practitioner

## 2023-09-26 ENCOUNTER — Encounter: Payer: Self-pay | Admitting: Nurse Practitioner

## 2023-09-26 VITALS — BP 179/89 | HR 58 | Temp 97.9°F | Ht 62.0 in | Wt 162.0 lb

## 2023-09-26 DIAGNOSIS — I4821 Permanent atrial fibrillation: Secondary | ICD-10-CM

## 2023-09-26 LAB — COAGUCHEK XS/INR WAIVED
INR: 2.5 — ABNORMAL HIGH (ref 0.9–1.1)
Prothrombin Time: 29.7 s

## 2023-09-26 NOTE — Progress Notes (Signed)
 Subjective:   Chief Complaint: INR recheck   Indication: atrial fibrillation Bleeding signs/symptoms: None Thromboembolic signs/symptoms: None  Missed Coumadin  doses: None Medication changes: no Dietary changes: no Bacterial/viral infection: no Other concerns: no  The following portions of the patient's history were reviewed and updated as appropriate: allergies, current medications, past family history, past medical history, past social history, past surgical history, and problem list.  Review of Systems Pertinent items noted in HPI and remainder of comprehensive ROS otherwise negative.   Objective:    INR Today: 2.5 Current dose: coumadin  3.75mg  except 7.5mg  on thursday   Assessment:    Therapeutic INR for goal of 2-3   Plan:    1. New dose: no change   2. Next INR: 1 month  Mary-Margaret Gladis, FNP

## 2023-10-27 ENCOUNTER — Ambulatory Visit (INDEPENDENT_AMBULATORY_CARE_PROVIDER_SITE_OTHER): Payer: Medicare Other | Admitting: Nurse Practitioner

## 2023-10-27 ENCOUNTER — Encounter: Payer: Self-pay | Admitting: Nurse Practitioner

## 2023-10-27 VITALS — BP 144/78 | HR 50 | Temp 97.3°F | Ht 62.0 in | Wt 167.0 lb

## 2023-10-27 DIAGNOSIS — I4821 Permanent atrial fibrillation: Secondary | ICD-10-CM | POA: Diagnosis not present

## 2023-10-27 DIAGNOSIS — E039 Hypothyroidism, unspecified: Secondary | ICD-10-CM

## 2023-10-27 DIAGNOSIS — E8881 Metabolic syndrome: Secondary | ICD-10-CM | POA: Diagnosis not present

## 2023-10-27 DIAGNOSIS — I10 Essential (primary) hypertension: Secondary | ICD-10-CM

## 2023-10-27 DIAGNOSIS — E782 Mixed hyperlipidemia: Secondary | ICD-10-CM | POA: Diagnosis not present

## 2023-10-27 DIAGNOSIS — Z8673 Personal history of transient ischemic attack (TIA), and cerebral infarction without residual deficits: Secondary | ICD-10-CM | POA: Diagnosis not present

## 2023-10-27 DIAGNOSIS — G40319 Generalized idiopathic epilepsy and epileptic syndromes, intractable, without status epilepticus: Secondary | ICD-10-CM

## 2023-10-27 DIAGNOSIS — N393 Stress incontinence (female) (male): Secondary | ICD-10-CM

## 2023-10-27 MED ORDER — METOPROLOL TARTRATE 50 MG PO TABS
ORAL_TABLET | ORAL | 1 refills | Status: DC
Start: 2023-10-27 — End: 2024-06-24

## 2023-10-27 MED ORDER — LEVOTHYROXINE SODIUM 88 MCG PO TABS: ORAL_TABLET | ORAL | 1 refills | Status: DC

## 2023-10-27 MED ORDER — AMLODIPINE BESYLATE 2.5 MG PO TABS: 2.5000 mg | ORAL_TABLET | Freq: Every day | ORAL | 1 refills | Status: DC

## 2023-10-27 MED ORDER — FUROSEMIDE 20 MG PO TABS: ORAL_TABLET | ORAL | 1 refills | Status: DC

## 2023-10-27 MED ORDER — LEVETIRACETAM 500 MG PO TABS: 500.0000 mg | ORAL_TABLET | Freq: Every day | ORAL | 1 refills | Status: DC

## 2023-10-27 MED ORDER — ATORVASTATIN CALCIUM 10 MG PO TABS: 10.0000 mg | ORAL_TABLET | Freq: Every day | ORAL | 1 refills | Status: DC

## 2023-10-27 MED ORDER — WARFARIN SODIUM 7.5 MG PO TABS: 7.5000 mg | ORAL_TABLET | Freq: Once | ORAL | 1 refills | Status: DC

## 2023-10-27 NOTE — Progress Notes (Signed)
Subjective:    Patient ID: Melissa Knight, female    DOB: 03/02/1933, 89 y.o.   MRN: [id number]   Chief Complaint: medical management of chronic issues     HPI:  Melissa Knight is a 89 y.o. who identifies as a female who was assigned female at birth.   Social history: Lives with: husband Work history: retired   Water engineer in today for follow up of the following chronic medical issues:  1. Essential hypertension No c/o chest pain, sob or headache. Does not ceck blood pressure at home BP Readings from Last 3 Encounters:  10/27/23 (!) 168/88  09/26/23 (!) 179/89  08/31/23 130/74      2. Permanent atrial fibrillation (HCC) Is on coumadin and is doing well. Denies any palpitations or heart racing.  Indication: atrial fibrillation Bleeding signs/symptoms: None Thromboembolic signs/symptoms: None  Missed Coumadin doses: None Medication changes: no Dietary changes: no Bacterial/viral infection: no Other concerns: no  Lab Results  Component Value Date   INR 2.5 (H) 09/26/2023   INR 2.1 (H) 08/29/2023   INR 2.1 (H) 08/01/2023      3. Hypothyroidism (acquired) No issues that aware of Lab Results  Component Value Date   TSH 0.875 10/11/2022     4. Mixed hyperlipidemia Does not rally watch diet and does no dedicated exercise. Lab Results  Component Value Date   CHOL 151 05/08/2023   HDL 46 05/08/2023   LDLCALC 84 05/08/2023   TRIG 119 05/08/2023   CHOLHDL 2.8 10/11/2022     5. Hx CVA No permanent effects  6. Metabolic syndrome Does not cHeck blood sugars at home. Lab Results  Component Value Date   HGBA1C 5.9 (H) 10/11/2022     7. Stress incontinence Wears pads  8. Seizure disorder, generalized convulsive, intractable (HCC) Has not had any more seizure issues. Continues to take keppra  9. Morbid obesity (HCC) Weight is up 5lbs Wt Readings from Last 3 Encounters:  10/27/23 167 lb (75.8 kg)  09/26/23 162 lb (73.5 kg)  08/31/23 168 lb (76.2 kg)     BMI Readings from Last 3 Encounters:  10/27/23 30.54 kg/m  09/26/23 29.63 kg/m  08/31/23 30.73 kg/m       New complaints: None today  Allergies  Allergen Reactions   Sulfa Antibiotics Rash    rash   Outpatient Encounter Medications as of 10/27/2023  Medication Sig   amLODipine (NORVASC) 2.5 MG tablet Take 1 tablet (2.5 mg total) by mouth daily.   atorvastatin (LIPITOR) 10 MG tablet Take 1 tablet (10 mg total) by mouth daily at 6 PM.   betamethasone dipropionate 0.05 % cream Apply daily as needed to affected area   calcium-vitamin D (OSCAL WITH D) 500-200 MG-UNIT tablet Take 1 tablet by mouth.   folic acid (FOLVITE) 1 MG tablet TAKE 1 TABLET BY MOUTH EVERY DAY   furosemide (LASIX) 20 MG tablet TAKE 1 TABLET EVERY DAY AS NEEDED   levETIRAcetam (KEPPRA) 500 MG tablet TAKE 1 TABLET (500 MG TOTAL) BY MOUTH DAILY.   levothyroxine (SYNTHROID) 88 MCG tablet TAKE 1 TABLET (88 MCG TOTAL) BY MOUTH DAILY.   lisinopril (ZESTRIL) 20 MG tablet TAKE 1 TABLET BY MOUTH TWICE A DAY   metoprolol tartrate (LOPRESSOR) 50 MG tablet TAKE 2 TABLETS BY MOUTH IN MORNING AND 1 TABLET BY MOUTH EVERY NIGHT AT BEDTIME   TURMERIC PO Take by mouth.   warfarin (COUMADIN) 7.5 MG tablet Take 1 tablet (7.5 mg total) by mouth one time only at  6 PM.   No facility-administered encounter medications on file as of 10/27/2023.    Past Surgical History:  Procedure Laterality Date   BLADDER SURGERY     bladder prolapse   CATARACT EXTRACTION Left 07/2023   CATARACT EXTRACTION W/PHACO Left 08/04/2023   Procedure: CATARACT EXTRACTION PHACO AND INTRAOCULAR LENS PLACEMENT (IOC);  Surgeon: Pecolia Ades, MD;  Location: [facility];  Service: Ophthalmology;  Laterality: Left;  CDE: 6.75   TUBAL LIGATION      Family History  Problem Relation Age of Onset   Atrial fibrillation Mother        pacemaker   Hypertension Mother    Heart disease Sister        MI in 4s   Stroke Sister    Heart disease Father    Heart  disease Sister    Heart disease Sister    Hypertension Sister    Anxiety disorder Sister    Heart disease Brother    Cancer Brother        pancreatic   Heart disease Son    Stroke Son 50      Controlled substance contract: n/a     Review of Systems  Constitutional:  Negative for diaphoresis.  Eyes:  Negative for pain.  Respiratory:  Negative for shortness of breath.   Cardiovascular:  Negative for chest pain, palpitations and leg swelling.  Gastrointestinal:  Negative for abdominal pain.  Endocrine: Negative for polydipsia.  Skin:  Negative for rash.  Neurological:  Negative for dizziness, weakness and headaches.  Hematological:  Does not bruise/bleed easily.  All other systems reviewed and are negative.      Objective:   Physical Exam Vitals and nursing note reviewed.  Constitutional:      General: She is not in acute distress.    Appearance: Normal appearance. She is well-developed.  HENT:     Head: Normocephalic.     Right Ear: Tympanic membrane normal.     Left Ear: Tympanic membrane normal.     Nose: Nose normal.     Mouth/Throat:     Mouth: Mucous membranes are moist.  Eyes:     Pupils: Pupils are equal, round, and reactive to light.  Neck:     Vascular: No carotid bruit or JVD.  Cardiovascular:     Rate and Rhythm: Normal rate. Rhythm irregular.     Heart sounds: Normal heart sounds.  Pulmonary:     Effort: Pulmonary effort is normal. No respiratory distress.     Breath sounds: Normal breath sounds. No wheezing or rales.  Chest:     Chest wall: No tenderness.  Abdominal:     General: Bowel sounds are normal. There is no distension or abdominal bruit.     Palpations: Abdomen is soft. There is no hepatomegaly, splenomegaly, mass or pulsatile mass.     Tenderness: There is no abdominal tenderness.  Musculoskeletal:        General: Normal range of motion.     Cervical back: Normal range of motion and neck supple.     Right lower leg: Edema (1+)  present.     Left lower leg: Edema (1+) present.  Lymphadenopathy:     Cervical: No cervical adenopathy.  Skin:    General: Skin is warm and dry.  Neurological:     Mental Status: She is alert and oriented to person, place, and time.     Deep Tendon Reflexes: Reflexes are normal and symmetric.  Psychiatric:  Behavior: Behavior normal.        Thought Content: Thought content normal.        Judgment: Judgment normal.     BP (!) 144/78   Pulse (!) 50   Temp (!) 97.3 F (36.3 C)   Ht 5\' 2"  (1.575 m)   Wt 167 lb (75.8 kg)   SpO2 98%   BMI 30.54 kg/m           Assessment & Plan:  Melissa Knight comes in today with chief complaint of medical management of chronic issues    Diagnosis and orders addressed:  1. Essential hypertension Low sodium diet - amLODipine (NORVASC) 2.5 MG tablet; Take 1 tablet (2.5 mg total) by mouth daily.  Dispense: 90 tablet; Refill: 1 - furosemide (LASIX) 20 MG tablet; TAKE 1 TABLET EVERY DAY AS NEEDED  Dispense: 90 tablet; Refill: 1 - lisinopril (ZESTRIL) 20 MG tablet; TAKE 1 TABLET BY MOUTH TWICE A DAY  Dispense: 180 tablet; Refill: 1 - CBC with Differential/Platelet - CMP14+EGFR  2. Permanent atrial fibrillation (HCC) Avoid caffeine Hold coumadin for 2 days then 3.75mg  daily - metoprolol tartrate (LOPRESSOR) 50 MG tablet; TAKE 2 TABLETS BY MOUTH IN MORNING AND 1 TABLET BY MOUTH EVERY NIGHT AT BEDTIME  Dispense: 270 tablet; Refill: 1 - warfarin (COUMADIN) 7.5 MG tablet; Take 1 tablet (7.5 mg total) by mouth one time only at 6 PM.  Dispense: 90 tablet; Refill: 1  3. Hypothyroidism (acquired) Labs pending - levothyroxine (SYNTHROID) 88 MCG tablet; TAKE 1 TABLET (88 MCG TOTAL) BY MOUTH DAILY.  Dispense: 90 tablet; Refill: 1 - Thyroid Panel With TSH  4. Mixed hyperlipidemia Low fat diet - atorvastatin (LIPITOR) 10 MG tablet; Take 1 tablet (10 mg total) by mouth daily at 6 PM.  Dispense: 90 tablet; Refill: 1 - Lipid panel  5. Late  effect of cerebrovascular accident Report any one sided weakness  6. Metabolic syndrome Watch carbs in diet  7. Stress incontinence  8. Seizure disorder, generalized convulsive, intractable (HCC) - levETIRAcetam (KEPPRA) 500 MG tablet; Take 1 tablet (500 mg total) by mouth daily.  Dispense: 90 tablet; Refill: 1  9. Morbid obesity (HCC) Discussed diet and exercise for person with BMI >25 Will recheck weight in 3-6 months    Labs pending Health Maintenance reviewed Diet and exercise encouraged  Follow up plan: 1 month   Mary-Margaret Daphine Deutscher, FNP

## 2023-10-30 LAB — COAGUCHEK XS/INR WAIVED
INR: 4 — ABNORMAL HIGH (ref 0.9–1.1)
Prothrombin Time: 48.6 s

## 2023-11-07 ENCOUNTER — Encounter: Payer: Self-pay | Admitting: Nurse Practitioner

## 2023-11-07 ENCOUNTER — Ambulatory Visit (INDEPENDENT_AMBULATORY_CARE_PROVIDER_SITE_OTHER): Payer: Medicare Other | Admitting: Nurse Practitioner

## 2023-11-07 VITALS — BP 155/84 | HR 59 | Temp 97.8°F | Ht 62.0 in | Wt 164.0 lb

## 2023-11-07 DIAGNOSIS — I4821 Permanent atrial fibrillation: Secondary | ICD-10-CM

## 2023-11-07 LAB — COAGUCHEK XS/INR WAIVED
INR: 1.8 — ABNORMAL HIGH (ref 0.9–1.1)
Prothrombin Time: 21.8 s

## 2023-11-07 NOTE — Progress Notes (Signed)
 Subjective:   Chief Complaint: INR recheck   Indication: atrial fibrillation Bleeding signs/symptoms: None Thromboembolic signs/symptoms: None  Missed Coumadin doses: None Medication changes: no Dietary changes: no Bacterial/viral infection: no Other concerns: no  The following portions of the patient's history were reviewed and updated as appropriate: allergies, current medications, past family history, past medical history, past social history, past surgical history, and problem list.  Review of Systems Pertinent items noted in HPI and remainder of comprehensive ROS otherwise negative.   Objective:    INR Today: 1.8 Current dose: coumadin 3.75mg  daily   Assessment:    Subtherapeutic INR for goal of 2-3   Plan:    1. New dose: coumadin 3.75mg  daily except 7.5mg  on   thursday 2. Next INR: 1 month   Mary-Margaret Daphine Deutscher, FNP

## 2023-12-05 ENCOUNTER — Ambulatory Visit (INDEPENDENT_AMBULATORY_CARE_PROVIDER_SITE_OTHER): Payer: Medicare Other | Admitting: Nurse Practitioner

## 2023-12-05 ENCOUNTER — Encounter: Payer: Self-pay | Admitting: Nurse Practitioner

## 2023-12-05 VITALS — BP 134/77 | HR 48 | Temp 97.9°F | Ht 62.0 in | Wt 161.0 lb

## 2023-12-05 DIAGNOSIS — I4821 Permanent atrial fibrillation: Secondary | ICD-10-CM

## 2023-12-05 LAB — COAGUCHEK XS/INR WAIVED
INR: 4 — ABNORMAL HIGH (ref 0.9–1.1)
Prothrombin Time: 47.6 s

## 2023-12-05 NOTE — Progress Notes (Signed)
 Subjective:   Chief Complaints: INR recheck   Indication: atrial fibrillation Bleeding signs/symptoms: None Thromboembolic signs/symptoms: None  Missed Coumadin doses: None Medication changes: no Dietary changes: no Bacterial/viral infection: no Other concerns: no  The following portions of the patient's history were reviewed and updated as appropriate: allergies, current medications, past family history, past medical history, past social history, past surgical history, and problem list.  Review of Systems Pertinent items noted in HPI and remainder of comprehensive ROS otherwise negative.   Objective:    INR Today: 4.0 Current dose: Coumadin 3.75 daily except 7.5mg  on thursday   Assessment:    Supratherapeutic INR for goal of 2-3   Plan:    1. New dose: Hold today and tomorrow the Coumadin 3.75 daily except 7.5mg  on thursday   2. Next INR: 1 month  Mary-Margaret Daphine Deutscher, FNP

## 2024-01-01 ENCOUNTER — Other Ambulatory Visit: Payer: Self-pay | Admitting: Nurse Practitioner

## 2024-01-05 ENCOUNTER — Encounter: Payer: Self-pay | Admitting: Nurse Practitioner

## 2024-01-05 ENCOUNTER — Ambulatory Visit: Admitting: Nurse Practitioner

## 2024-01-05 VITALS — BP 155/85 | HR 50 | Temp 97.5°F | Ht 62.0 in | Wt 163.0 lb

## 2024-01-05 DIAGNOSIS — I4821 Permanent atrial fibrillation: Secondary | ICD-10-CM

## 2024-01-05 LAB — COAGUCHEK XS/INR WAIVED
INR: 4.2 — ABNORMAL HIGH (ref 0.9–1.1)
Prothrombin Time: 50.1 s

## 2024-01-05 NOTE — Progress Notes (Signed)
 Subjective:   Chief Complaints: INR recheck   Indication: atrial fibrillation Bleeding signs/symptoms: None Thromboembolic signs/symptoms: None  Missed Coumadin  doses: None Medication changes: no Dietary changes: no Bacterial/viral infection: no Other concerns: no  The following portions of the patient's history were reviewed and updated as appropriate: allergies, current medications, past family history, past medical history, past social history, past surgical history, and problem list.  Review of Systems Pertinent items noted in HPI and remainder of comprehensive ROS otherwise negative.   Objective:    INR Today: 4.2 Current dose: Coumadin  3.75 daily except 7.5mg  on thursday  Assessment:    Supratherapeutic INR for goal of 2-3   Plan:    1. New dose: Hold today and then coumadin  3.75mg  daily 2. Next INR: 1 month  Mary-Margaret Gaylyn Keas, FNP

## 2024-01-30 ENCOUNTER — Encounter: Payer: Self-pay | Admitting: Nurse Practitioner

## 2024-01-30 ENCOUNTER — Ambulatory Visit (INDEPENDENT_AMBULATORY_CARE_PROVIDER_SITE_OTHER): Admitting: Nurse Practitioner

## 2024-01-30 ENCOUNTER — Ambulatory Visit: Payer: Self-pay | Admitting: Nurse Practitioner

## 2024-01-30 VITALS — BP 150/84 | HR 52 | Temp 97.9°F | Ht 62.0 in | Wt 157.0 lb

## 2024-01-30 DIAGNOSIS — I4821 Permanent atrial fibrillation: Secondary | ICD-10-CM

## 2024-01-30 LAB — COAGUCHEK XS/INR WAIVED
INR: 2.7 — ABNORMAL HIGH (ref 0.9–1.1)
Prothrombin Time: 32.1 s

## 2024-01-30 NOTE — Progress Notes (Signed)
 Subjective:   Chief Complaint: INR recheck   Indication: atrial fibrillation Bleeding signs/symptoms: None Thromboembolic signs/symptoms: None  Missed Coumadin  doses: None Medication changes: no Dietary changes: no Bacterial/viral infection: no Other concerns: no  The following portions of the patient's history were reviewed and updated as appropriate: allergies, current medications, past family history, past medical history, past social history, past surgical history, and problem list.  Review of Systems Pertinent items noted in HPI and remainder of comprehensive ROS otherwise negative.   Objective:    INR Today: 2.7 Current dose:  coumadin  3.75mg  daily   Assessment:    Therapeutic INR for goal of 2-3   Plan:    1. New dose: no change   2. Next INR: 1 month  Mary-Margaret Gaylyn Keas, FNP

## 2024-02-23 ENCOUNTER — Other Ambulatory Visit: Payer: Self-pay | Admitting: Nurse Practitioner

## 2024-02-23 DIAGNOSIS — I10 Essential (primary) hypertension: Secondary | ICD-10-CM

## 2024-02-27 ENCOUNTER — Encounter: Payer: Self-pay | Admitting: Nurse Practitioner

## 2024-02-27 ENCOUNTER — Ambulatory Visit: Admitting: Nurse Practitioner

## 2024-02-27 VITALS — BP 139/82 | HR 79 | Temp 97.7°F | Ht 62.0 in | Wt 158.0 lb

## 2024-02-27 DIAGNOSIS — I4821 Permanent atrial fibrillation: Secondary | ICD-10-CM | POA: Diagnosis not present

## 2024-02-27 LAB — COAGUCHEK XS/INR WAIVED
INR: 3.1 — ABNORMAL HIGH (ref 0.9–1.1)
Prothrombin Time: 36.8 s

## 2024-02-27 NOTE — Progress Notes (Signed)
 Subjective:   Chief Complaint: INR recheck   Indication: atrial fibrillation Bleeding signs/symptoms: None Thromboembolic signs/symptoms: None  Missed Coumadin  doses: None Medication changes: no Dietary changes: no Bacterial/viral infection: no Other concerns: no  The following portions of the patient's history were reviewed and updated as appropriate: allergies, current medications, past family history, past medical history, past social history, past surgical history, and problem list.  Review of Systems Pertinent items are noted in HPI.   Objective:    INR Today: 3.1 Current dose: continue coumadin  3.75mg  daily   Assessment:    Therapeutic INR for goal of 2-3   Plan:    1. New dose: no change   2. Next INR: 1 month   Mary-Margaret Gaylyn Keas, FNP

## 2024-02-29 ENCOUNTER — Ambulatory Visit: Payer: Self-pay | Admitting: Nurse Practitioner

## 2024-03-06 ENCOUNTER — Encounter (HOSPITAL_COMMUNITY): Payer: Self-pay

## 2024-03-06 ENCOUNTER — Emergency Department (HOSPITAL_COMMUNITY)
Admission: EM | Admit: 2024-03-06 | Discharge: 2024-03-06 | Discharge status: Against Medical Advice | Attending: Emergency Medicine | Admitting: Emergency Medicine

## 2024-03-06 ENCOUNTER — Other Ambulatory Visit: Payer: Self-pay

## 2024-03-06 DIAGNOSIS — R0602 Shortness of breath: Secondary | ICD-10-CM | POA: Diagnosis not present

## 2024-03-06 DIAGNOSIS — R109 Unspecified abdominal pain: Secondary | ICD-10-CM | POA: Insufficient documentation

## 2024-03-06 DIAGNOSIS — R42 Dizziness and giddiness: Secondary | ICD-10-CM | POA: Diagnosis not present

## 2024-03-06 DIAGNOSIS — R1084 Generalized abdominal pain: Secondary | ICD-10-CM | POA: Diagnosis not present

## 2024-03-06 DIAGNOSIS — R112 Nausea with vomiting, unspecified: Secondary | ICD-10-CM | POA: Diagnosis not present

## 2024-03-06 DIAGNOSIS — Z5321 Procedure and treatment not carried out due to patient leaving prior to being seen by health care provider: Secondary | ICD-10-CM | POA: Diagnosis not present

## 2024-03-06 DIAGNOSIS — R61 Generalized hyperhidrosis: Secondary | ICD-10-CM | POA: Diagnosis not present

## 2024-03-06 DIAGNOSIS — I959 Hypotension, unspecified: Secondary | ICD-10-CM | POA: Diagnosis not present

## 2024-03-06 DIAGNOSIS — R197 Diarrhea, unspecified: Secondary | ICD-10-CM | POA: Insufficient documentation

## 2024-03-06 DIAGNOSIS — R55 Syncope and collapse: Secondary | ICD-10-CM | POA: Diagnosis not present

## 2024-03-06 LAB — COMPREHENSIVE METABOLIC PANEL WITH GFR
ALT: 9 U/L (ref 0–44)
AST: 18 U/L (ref 15–41)
Albumin: 3.5 g/dL (ref 3.5–5.0)
Alkaline Phosphatase: 34 U/L — ABNORMAL LOW (ref 38–126)
Anion gap: 13 (ref 5–15)
BUN: 13 mg/dL (ref 8–23)
CO2: 21 mmol/L — ABNORMAL LOW (ref 22–32)
Calcium: 8.9 mg/dL (ref 8.9–10.3)
Chloride: 106 mmol/L (ref 98–111)
Creatinine, Ser: 0.94 mg/dL (ref 0.44–1.00)
GFR, Estimated: 58 mL/min — ABNORMAL LOW (ref >60)
Glucose, Bld: 88 mg/dL (ref 70–99)
Potassium: 3 mmol/L — ABNORMAL LOW (ref 3.5–5.1)
Sodium: 140 mmol/L (ref 135–145)
Total Bilirubin: 0.6 mg/dL (ref 0.0–1.2)
Total Protein: 5.9 g/dL — ABNORMAL LOW (ref 6.5–8.1)

## 2024-03-06 LAB — CBC
HCT: 40 % (ref 36.0–46.0)
Hemoglobin: 12.4 g/dL (ref 12.0–15.0)
MCH: 29.7 pg (ref 26.0–34.0)
MCHC: 31 g/dL (ref 30.0–36.0)
MCV: 95.9 fL (ref 80.0–100.0)
Platelets: 195 10*3/uL (ref 150–400)
RBC: 4.17 MIL/uL (ref 3.87–5.11)
RDW: 12.3 % (ref 11.5–15.5)
WBC: 7.6 10*3/uL (ref 4.0–10.5)
nRBC: 0 % (ref 0.0–0.2)

## 2024-03-06 LAB — LIPASE, BLOOD: Lipase: 31 U/L (ref 11–51)

## 2024-03-06 NOTE — ED Triage Notes (Signed)
 Pt arrived reporting N/V/D. States this started a few days ago. Reports abdominal cramping intermittently as well. Was at the grocery store and reports feeling faint, did not pass out.

## 2024-03-06 NOTE — ED Provider Triage Note (Signed)
 Emergency Medicine Provider Triage Evaluation Note  Melissa Knight , a 89 y.o. female  was evaluated in triage.  Pt complains of near syncopal event.  Patient was shopping at the grocery store with her grandson when she became diaphoretic, she subsequently felt like I was going to pass out with associated shortness of breath, her grandson and another person were able to help lower her to the floor and then EMS was called.  She reports over the last 2 weeks having cramping abdominal pain/diarrhea/vomiting, last episode of vomiting was yesterday and was small/foamy with episode of diarrhea this morning, she reports she has not been eating very much because of this.  She denies melena/hematochezia, hematemesis, dysuria/hematuria, chest pain.  Review of Systems  Positive: As above Negative: Above  Physical Exam  BP (!) 143/83 (BP Location: Right Arm)   Pulse 72   Temp 98.2 F (36.8 C) (Oral)   Resp 16   Ht 5' 2 (1.575 m)   Wt 71 kg   SpO2 100%   BMI 28.63 kg/m  Gen:   Awake, no distress   Resp:  Normal effort  MSK:   Moves extremities without difficulty  Other:  Abdomen is soft and nontender to palpation  Medical Decision Making  Medically screening exam initiated at 3:48 PM.  Appropriate orders placed.  Kinaya Lapier was informed that the remainder of the evaluation will be completed by another provider, this initial triage assessment does not replace that evaluation, and the importance of remaining in the ED until their evaluation is complete.     Glendia Rocky SAILOR, [place] 03/06/24 [id number]

## 2024-03-07 ENCOUNTER — Ambulatory Visit: Payer: Self-pay

## 2024-03-07 NOTE — Telephone Encounter (Signed)
 Called [facility] to advise them of patient's situation and ER refusal at this time

## 2024-03-07 NOTE — Telephone Encounter (Signed)
 Called and spoke with patient. Appt made for tomorrow per patients request

## 2024-03-07 NOTE — Telephone Encounter (Signed)
    FYI Only or Action Required?: Action required by provider: update on patient condition.  Patient was last seen in primary care on 02/27/2024 by Melissa Mustard, FNP. Called Nurse Triage reporting No chief complaint on file.. Symptoms began patient states since May 20 but she was taken to the ER yesterday and left AMA. Interventions attempted: Rest, hydration, or home remedies. Symptoms are: unchanged.  Triage Disposition: Go to ED or PCP/Alternative with Approval  Patient/caregiver understands and will follow disposition?: No, refuses disposition                    Copied from CRM [id number]. Topic: Clinical - Red Word Triage >> Mar 07, 2024  2:04 PM [name] wrote: Red Word that prompted transfer to Nurse Triage: The patient states she has been sick for about a month with a possible stomach flu. The patient fainted yesterday and went to [facility] The patient has been having, diarrhea, vomiting, stomach cramps, and she has lost 10 pounds. She spoke to FNP Kameren Baade-Margaret about it during her appointment in May but states it has been worsening. She thinks it was starvation and dehydration that made her faint at the grocery store. She states that she is still experiencing weakness. Reason for Disposition  Patient sounds very sick or weak to the triager  Answer Assessment - Initial Assessment Questions 1. DESCRIPTION: Describe how you are feeling.     Patient states she feels a little weak 2. SEVERITY: How bad is it?  Can you stand and walk?   - MILD (0-3): Feels weak or tired, but does not interfere with work, school or normal activities.   - MODERATE (4-7): Able to stand and walk; weakness interferes with work, school, or normal activities.   - SEVERE (8-10): Unable to stand or walk; unable to do usual activities.     Patient states that she feels okay  3. ONSET: When did these symptoms begin? (e.g., hours, days, weeks, months)     Around May 20 is when  all this began according to the patient.  She was having cramping and a possibly stomach virus 4. CAUSE: What do you think is causing the weakness or fatigue? (e.g., not drinking enough fluids, medical problem, trouble sleeping)     She is drinking water  and trying to hydrate 5. NEW MEDICINES:  Have you started on any new medicines recently? (e.g., opioid pain medicines, benzodiazepines, muscle relaxants, antidepressants, antihistamines, neuroleptics, beta blockers)     No 6. OTHER SYMPTOMS: Do you have any other symptoms? (e.g., chest pain, fever, cough, SOB, vomiting, diarrhea, bleeding, other areas of pain)     Patient states only weakness today   Patient states that she doesn't like the Emergency Room- She states she is eating and drinking water . Patient states that they wanted to do more blood work on her but she left. She states that she thought they forgot about her. Patient advised that the best thing right now is to go back to the ER to finish being assessed/evaluated/complete whatever blood work/tests they wanted to do prior to her leaving.  She also states that she doesn't want to go to the ER. This RN advised the patient that going to the ER is the recommendation at this time. Patient is advised that if anything gets worse to call an ambulance.  Protocols used: Weakness (Generalized) and Fatigue-A-AH

## 2024-03-08 ENCOUNTER — Encounter: Payer: Self-pay | Admitting: Nurse Practitioner

## 2024-03-08 ENCOUNTER — Ambulatory Visit (INDEPENDENT_AMBULATORY_CARE_PROVIDER_SITE_OTHER): Admitting: Nurse Practitioner

## 2024-03-08 VITALS — BP 145/83 | HR 52 | Temp 97.5°F

## 2024-03-08 DIAGNOSIS — A084 Viral intestinal infection, unspecified: Secondary | ICD-10-CM | POA: Diagnosis not present

## 2024-03-08 DIAGNOSIS — R197 Diarrhea, unspecified: Secondary | ICD-10-CM

## 2024-03-08 NOTE — Progress Notes (Signed)
 Subjective:    Patient ID: Melissa Knight, female    DOB: [birth year], 89 y.o.   MRN: [id number]   Chief Complaint: stomach issues  HPI  Patient says that for the last few days she has has nausea , vomiting and diarrhea. Lasted about 3 days. Was very weak and has hd no appetiite. Feels much better today. Appetite is slowly coming back.  Wt Readings from Last 3 Encounters:  03/06/24 156 lb 8.4 oz (71 kg)  02/27/24 158 lb (71.7 kg)  01/30/24 157 lb (71.2 kg)   BMI Readings from Last 3 Encounters:  03/06/24 28.63 kg/m  02/27/24 28.90 kg/m  01/30/24 28.72 kg/m    Patient Active Problem List   Diagnosis Date Noted   Morbid obesity (HCC) 10/05/2016   Lichen sclerosus of female genitalia 10/10/2014   Stress incontinence 10/10/2014   Hypothyroidism (acquired) 06/05/2014   Metabolic syndrome 01/01/2014   Hyperlipidemia 02/05/2013   Permanent atrial fibrillation (HCC) 07/02/2012   Late effect of cerebrovascular accident 06/30/2012   Seizure disorder, generalized convulsive, intractable (HCC) 06/30/2012   Essential hypertension 06/30/2012       Review of Systems  Constitutional:  Negative for chills and fever.  Respiratory:  Negative for shortness of breath.   Gastrointestinal:  Positive for abdominal pain (resolved), diarrhea, nausea and vomiting.  All other systems reviewed and are negative.      Objective:   Physical Exam Constitutional:      Appearance: Normal appearance.   Cardiovascular:     Rate and Rhythm: Regular rhythm.     Heart sounds: Normal heart sounds.  Pulmonary:     Effort: Pulmonary effort is normal.     Breath sounds: Normal breath sounds.   Skin:    General: Skin is warm.   Neurological:     General: No focal deficit present.     Mental Status: She is alert and oriented to person, place, and time.   Psychiatric:        Mood and Affect: Mood normal.        Behavior: Behavior normal.    BP (!) 145/83   Pulse (!) 52   Temp (!) 97.5 F  (36.4 C) (Temporal)   SpO2 94%         Assessment & Plan:  Melissa Knight in today with chief complaint of Vomiting and Diarrhea (Stomach cramps for 1 month/)   1. Diarrhea, unspecified type (Primary) - CMP14+EGFR - Amylase - Lipase  2. Viral gastroenteritis IS RESOLVED First 24 Hours-Clear liquids  popsicles  Jello  gatorade  Sprite Second 24 hours-Add Full liquids ( Liquids you cant see through) Third 24 hours- Bland diet ( foods that are baked or broiled)  *avoiding fried foods and highly spiced foods* During these 3 days  Avoid milk, cheese, ice cream or any other dairy products  Avoid caffeine- REMEMBER Mt. Dew and Mello Yellow contain lots of caffeine You should eat and drink in  Frequent small volumes If no improvement in symptoms or worsen in 2-3 days should RETRUN TO OFFICE or go to ER!       The above assessment and management plan was discussed with the patient. The patient verbalized understanding of and has agreed to the management plan. Patient is aware to call the clinic if symptoms persist or worsen. Patient is aware when to return to the clinic for a follow-up visit. Patient educated on when it is appropriate to go to the emergency department.   Mary-Margaret Gladis, FNP '

## 2024-03-08 NOTE — Patient Instructions (Signed)
First 24 Hours-Clear liquids  popsicles  Jello  gatorade  Sprite Second 24 hours-Add Full liquids ( Liquids you cant see through) Third 24 hours- Bland diet ( foods that are baked or broiled)  *avoiding fried foods and highly spiced foods* During these 3 days  Avoid milk, cheese, ice cream or any other dairy products  Avoid caffeine- REMEMBER Mt. Dew and Mello Yellow contain lots of caffeine You should eat and drink in  Frequent small volumes If no improvement in symptoms or worsen in 2-3 days should RETRUN TO OFFICE or go to ER!     

## 2024-03-19 ENCOUNTER — Other Ambulatory Visit: Payer: Self-pay | Admitting: Nurse Practitioner

## 2024-03-19 DIAGNOSIS — E039 Hypothyroidism, unspecified: Secondary | ICD-10-CM

## 2024-03-19 MED ORDER — LEVOTHYROXINE SODIUM 88 MCG PO TABS
88.0000 ug | ORAL_TABLET | Freq: Every day | ORAL | 0 refills | Status: DC
Start: 2024-03-19 — End: 2024-05-16

## 2024-03-19 NOTE — Addendum Note (Signed)
 Addended by: Neyra Pettie D on: 03/19/2024 09:59 AM   Modules accepted: Orders

## 2024-03-19 NOTE — Telephone Encounter (Signed)
 Refill failed. resent

## 2024-03-20 ENCOUNTER — Telehealth: Payer: Self-pay

## 2024-03-20 NOTE — Telephone Encounter (Signed)
 Copied from CRM [id number]. Topic: Clinical - Medical Advice >> Mar 20, 2024 10:57 AM Myrick T wrote: Reason for CRM: patient called stated her husband is in the hospital with covid/pneumonia. Patient took a home test and the line was grey. She is wanting to know if she should come in for her appt on 7/14 or just reschedule. No symptoms at this time. Please f/u with patient for the protocol

## 2024-03-20 NOTE — Telephone Encounter (Signed)
 Patient advised to come in for her visit.

## 2024-03-25 ENCOUNTER — Encounter: Payer: Self-pay | Admitting: Nurse Practitioner

## 2024-03-25 ENCOUNTER — Ambulatory Visit (INDEPENDENT_AMBULATORY_CARE_PROVIDER_SITE_OTHER): Admitting: Nurse Practitioner

## 2024-03-25 VITALS — BP 141/80 | HR 53 | Temp 98.7°F | Ht 62.0 in | Wt 156.0 lb

## 2024-03-25 DIAGNOSIS — I4821 Permanent atrial fibrillation: Secondary | ICD-10-CM

## 2024-03-25 LAB — COAGUCHEK XS/INR WAIVED
INR: 4.4 — ABNORMAL HIGH (ref 0.9–1.1)
Prothrombin Time: 53 s

## 2024-03-25 NOTE — Progress Notes (Signed)
 Subjective:   Chief Complaint: INR recheck   Indication: atrial fibrillation Bleeding signs/symptoms: None Thromboembolic signs/symptoms: None  Missed Coumadin  doses: None Medication changes: no Dietary changes: no Bacterial/viral infection: no Other concerns: no  The following portions of the patient's history were reviewed and updated as appropriate: allergies, current medications, past family history, past medical history, past social history, past surgical history, and problem list.  Review of Systems Pertinent items are noted in HPI.   Objective:    INR Today: 4.4 Current dose: continue coumadin  3.75mg  daily   Assessment:    Therapeutic INR for goal of 2-3   Plan:    1. New dose: hold today then continue 3.75mg  daily 2. Next INR: 1 month   Mary-Margaret Gladis, FNP

## 2024-03-26 ENCOUNTER — Ambulatory Visit: Payer: Self-pay | Admitting: Nurse Practitioner

## 2024-04-01 ENCOUNTER — Other Ambulatory Visit: Payer: Self-pay | Admitting: Nurse Practitioner

## 2024-04-08 ENCOUNTER — Other Ambulatory Visit: Payer: Self-pay

## 2024-04-08 ENCOUNTER — Encounter (HOSPITAL_BASED_OUTPATIENT_CLINIC_OR_DEPARTMENT_OTHER): Payer: Self-pay | Admitting: Emergency Medicine

## 2024-04-08 ENCOUNTER — Emergency Department (HOSPITAL_BASED_OUTPATIENT_CLINIC_OR_DEPARTMENT_OTHER)

## 2024-04-08 ENCOUNTER — Ambulatory Visit: Payer: Self-pay

## 2024-04-08 ENCOUNTER — Inpatient Hospital Stay (HOSPITAL_BASED_OUTPATIENT_CLINIC_OR_DEPARTMENT_OTHER)
Admission: EM | Admit: 2024-04-08 | Discharge: 2024-04-24 | DRG: 330 | Disposition: A | Discharge status: Home Health | Attending: Internal Medicine | Admitting: Internal Medicine

## 2024-04-08 DIAGNOSIS — Z961 Presence of intraocular lens: Secondary | ICD-10-CM | POA: Diagnosis present

## 2024-04-08 DIAGNOSIS — R627 Adult failure to thrive: Secondary | ICD-10-CM | POA: Diagnosis present

## 2024-04-08 DIAGNOSIS — K635 Polyp of colon: Secondary | ICD-10-CM | POA: Diagnosis not present

## 2024-04-08 DIAGNOSIS — E039 Hypothyroidism, unspecified: Secondary | ICD-10-CM | POA: Diagnosis present

## 2024-04-08 DIAGNOSIS — I4821 Permanent atrial fibrillation: Secondary | ICD-10-CM | POA: Diagnosis present

## 2024-04-08 DIAGNOSIS — Z01810 Encounter for preprocedural cardiovascular examination: Secondary | ICD-10-CM | POA: Diagnosis not present

## 2024-04-08 DIAGNOSIS — I700 Atherosclerosis of aorta: Secondary | ICD-10-CM | POA: Diagnosis not present

## 2024-04-08 DIAGNOSIS — R531 Weakness: Secondary | ICD-10-CM | POA: Diagnosis not present

## 2024-04-08 DIAGNOSIS — Z823 Family history of stroke: Secondary | ICD-10-CM

## 2024-04-08 DIAGNOSIS — R933 Abnormal findings on diagnostic imaging of other parts of digestive tract: Secondary | ICD-10-CM | POA: Diagnosis not present

## 2024-04-08 DIAGNOSIS — Z818 Family history of other mental and behavioral disorders: Secondary | ICD-10-CM

## 2024-04-08 DIAGNOSIS — K6389 Other specified diseases of intestine: Secondary | ICD-10-CM | POA: Diagnosis not present

## 2024-04-08 DIAGNOSIS — R634 Abnormal weight loss: Secondary | ICD-10-CM | POA: Diagnosis not present

## 2024-04-08 DIAGNOSIS — Z515 Encounter for palliative care: Secondary | ICD-10-CM | POA: Diagnosis not present

## 2024-04-08 DIAGNOSIS — N390 Urinary tract infection, site not specified: Secondary | ICD-10-CM | POA: Diagnosis not present

## 2024-04-08 DIAGNOSIS — Z9842 Cataract extraction status, left eye: Secondary | ICD-10-CM

## 2024-04-08 DIAGNOSIS — Z6826 Body mass index (BMI) 26.0-26.9, adult: Secondary | ICD-10-CM

## 2024-04-08 DIAGNOSIS — D125 Benign neoplasm of sigmoid colon: Secondary | ICD-10-CM | POA: Diagnosis not present

## 2024-04-08 DIAGNOSIS — Z79899 Other long term (current) drug therapy: Secondary | ICD-10-CM

## 2024-04-08 DIAGNOSIS — I679 Cerebrovascular disease, unspecified: Secondary | ICD-10-CM | POA: Diagnosis not present

## 2024-04-08 DIAGNOSIS — C772 Secondary and unspecified malignant neoplasm of intra-abdominal lymph nodes: Secondary | ICD-10-CM | POA: Diagnosis present

## 2024-04-08 DIAGNOSIS — D123 Benign neoplasm of transverse colon: Secondary | ICD-10-CM | POA: Diagnosis not present

## 2024-04-08 DIAGNOSIS — K5730 Diverticulosis of large intestine without perforation or abscess without bleeding: Secondary | ICD-10-CM | POA: Diagnosis not present

## 2024-04-08 DIAGNOSIS — Z8673 Personal history of transient ischemic attack (TIA), and cerebral infarction without residual deficits: Secondary | ICD-10-CM | POA: Diagnosis not present

## 2024-04-08 DIAGNOSIS — Z8249 Family history of ischemic heart disease and other diseases of the circulatory system: Secondary | ICD-10-CM | POA: Diagnosis not present

## 2024-04-08 DIAGNOSIS — E876 Hypokalemia: Secondary | ICD-10-CM | POA: Diagnosis not present

## 2024-04-08 DIAGNOSIS — C184 Malignant neoplasm of transverse colon: Secondary | ICD-10-CM | POA: Diagnosis not present

## 2024-04-08 DIAGNOSIS — K625 Hemorrhage of anus and rectum: Secondary | ICD-10-CM | POA: Diagnosis not present

## 2024-04-08 DIAGNOSIS — Z743 Need for continuous supervision: Secondary | ICD-10-CM | POA: Diagnosis not present

## 2024-04-08 DIAGNOSIS — E440 Moderate protein-calorie malnutrition: Secondary | ICD-10-CM | POA: Diagnosis not present

## 2024-04-08 DIAGNOSIS — E785 Hyperlipidemia, unspecified: Secondary | ICD-10-CM | POA: Diagnosis present

## 2024-04-08 DIAGNOSIS — R918 Other nonspecific abnormal finding of lung field: Secondary | ICD-10-CM | POA: Diagnosis present

## 2024-04-08 DIAGNOSIS — D630 Anemia in neoplastic disease: Secondary | ICD-10-CM | POA: Diagnosis not present

## 2024-04-08 DIAGNOSIS — D689 Coagulation defect, unspecified: Secondary | ICD-10-CM | POA: Diagnosis present

## 2024-04-08 DIAGNOSIS — Z7901 Long term (current) use of anticoagulants: Secondary | ICD-10-CM

## 2024-04-08 DIAGNOSIS — Z87891 Personal history of nicotine dependence: Secondary | ICD-10-CM | POA: Diagnosis not present

## 2024-04-08 DIAGNOSIS — I2510 Atherosclerotic heart disease of native coronary artery without angina pectoris: Secondary | ICD-10-CM | POA: Diagnosis present

## 2024-04-08 DIAGNOSIS — Z7189 Other specified counseling: Secondary | ICD-10-CM

## 2024-04-08 DIAGNOSIS — K648 Other hemorrhoids: Secondary | ICD-10-CM | POA: Diagnosis not present

## 2024-04-08 DIAGNOSIS — G40419 Other generalized epilepsy and epileptic syndromes, intractable, without status epilepticus: Secondary | ICD-10-CM | POA: Diagnosis present

## 2024-04-08 DIAGNOSIS — N281 Cyst of kidney, acquired: Secondary | ICD-10-CM | POA: Diagnosis not present

## 2024-04-08 DIAGNOSIS — K56609 Unspecified intestinal obstruction, unspecified as to partial versus complete obstruction: Secondary | ICD-10-CM | POA: Diagnosis not present

## 2024-04-08 DIAGNOSIS — T45515A Adverse effect of anticoagulants, initial encounter: Secondary | ICD-10-CM | POA: Diagnosis present

## 2024-04-08 DIAGNOSIS — I959 Hypotension, unspecified: Secondary | ICD-10-CM | POA: Diagnosis not present

## 2024-04-08 DIAGNOSIS — R109 Unspecified abdominal pain: Secondary | ICD-10-CM | POA: Diagnosis not present

## 2024-04-08 DIAGNOSIS — K5669 Other partial intestinal obstruction: Secondary | ICD-10-CM | POA: Diagnosis not present

## 2024-04-08 DIAGNOSIS — I4891 Unspecified atrial fibrillation: Secondary | ICD-10-CM | POA: Diagnosis not present

## 2024-04-08 DIAGNOSIS — K921 Melena: Secondary | ICD-10-CM | POA: Diagnosis not present

## 2024-04-08 DIAGNOSIS — R319 Hematuria, unspecified: Secondary | ICD-10-CM | POA: Diagnosis not present

## 2024-04-08 DIAGNOSIS — C189 Malignant neoplasm of colon, unspecified: Secondary | ICD-10-CM | POA: Diagnosis not present

## 2024-04-08 DIAGNOSIS — I10 Essential (primary) hypertension: Secondary | ICD-10-CM | POA: Diagnosis present

## 2024-04-08 DIAGNOSIS — Z882 Allergy status to sulfonamides status: Secondary | ICD-10-CM

## 2024-04-08 DIAGNOSIS — Z79890 Hormone replacement therapy (postmenopausal): Secondary | ICD-10-CM | POA: Diagnosis not present

## 2024-04-08 DIAGNOSIS — K529 Noninfective gastroenteritis and colitis, unspecified: Secondary | ICD-10-CM | POA: Diagnosis present

## 2024-04-08 DIAGNOSIS — G40319 Generalized idiopathic epilepsy and epileptic syndromes, intractable, without status epilepticus: Secondary | ICD-10-CM | POA: Diagnosis present

## 2024-04-08 DIAGNOSIS — R935 Abnormal findings on diagnostic imaging of other abdominal regions, including retroperitoneum: Secondary | ICD-10-CM | POA: Diagnosis not present

## 2024-04-08 LAB — COMPREHENSIVE METABOLIC PANEL WITH GFR
ALT: 7 U/L (ref 0–44)
AST: 19 U/L (ref 15–41)
Albumin: 3.8 g/dL (ref 3.5–5.0)
Alkaline Phosphatase: 38 U/L (ref 38–126)
Anion gap: 17 — ABNORMAL HIGH (ref 5–15)
BUN: 16 mg/dL (ref 8–23)
CO2: 21 mmol/L — ABNORMAL LOW (ref 22–32)
Calcium: 9.3 mg/dL (ref 8.9–10.3)
Chloride: 99 mmol/L (ref 98–111)
Creatinine, Ser: 0.67 mg/dL (ref 0.44–1.00)
GFR, Estimated: >60 mL/min (ref >60)
Glucose, Bld: 87 mg/dL (ref 70–99)
Potassium: 3.1 mmol/L — ABNORMAL LOW (ref 3.5–5.1)
Sodium: 137 mmol/L (ref 135–145)
Total Bilirubin: 0.6 mg/dL (ref 0.0–1.2)
Total Protein: 6.4 g/dL — ABNORMAL LOW (ref 6.5–8.1)

## 2024-04-08 LAB — URINALYSIS, ROUTINE W REFLEX MICROSCOPIC
Bilirubin Urine: NEGATIVE
Glucose, UA: NEGATIVE mg/dL
Ketones, ur: 80 mg/dL — AB
Nitrite: POSITIVE — AB
Protein, ur: NEGATIVE mg/dL
Specific Gravity, Urine: 1.01 (ref 1.005–1.030)
pH: 6 (ref 5.0–8.0)

## 2024-04-08 LAB — CBC
HCT: 37.6 % (ref 36.0–46.0)
Hemoglobin: 12.3 g/dL (ref 12.0–15.0)
MCH: 29.1 pg (ref 26.0–34.0)
MCHC: 32.7 g/dL (ref 30.0–36.0)
MCV: 88.9 fL (ref 80.0–100.0)
Platelets: 194 K/uL (ref 150–400)
RBC: 4.23 MIL/uL (ref 3.87–5.11)
RDW: 13.6 % (ref 11.5–15.5)
WBC: 3.2 K/uL — ABNORMAL LOW (ref 4.0–10.5)
nRBC: 0 % (ref 0.0–0.2)

## 2024-04-08 LAB — URINALYSIS, MICROSCOPIC (REFLEX): WBC, UA: >50 WBC/hpf (ref 0–5)

## 2024-04-08 LAB — LACTIC ACID, PLASMA: Lactic Acid, Venous: 1.9 mmol/L (ref 0.5–1.9)

## 2024-04-08 LAB — LIPASE, BLOOD: Lipase: 15 U/L (ref 11–51)

## 2024-04-08 LAB — OCCULT BLOOD X 1 CARD TO LAB, STOOL: Fecal Occult Bld: POSITIVE — AB

## 2024-04-08 MED ORDER — LACTATED RINGERS IV BOLUS
1000.0000 mL | Freq: Once | INTRAVENOUS | Status: AC
  Administered 2024-04-08: 1000 mL via INTRAVENOUS

## 2024-04-08 MED ORDER — FENTANYL CITRATE PF 50 MCG/ML IJ SOSY
25.0000 ug | PREFILLED_SYRINGE | Freq: Once | INTRAMUSCULAR | Status: AC
  Administered 2024-04-08: 25 ug via INTRAVENOUS
  Filled 2024-04-08: qty 1

## 2024-04-08 MED ORDER — POTASSIUM CHLORIDE CRYS ER 20 MEQ PO TBCR
40.0000 meq | EXTENDED_RELEASE_TABLET | Freq: Once | ORAL | Status: AC
  Administered 2024-04-08: 40 meq via ORAL
  Filled 2024-04-08: qty 2

## 2024-04-08 MED ORDER — IOHEXOL 300 MG/ML  SOLN
100.0000 mL | Freq: Once | INTRAMUSCULAR | Status: AC | PRN
Start: 2024-04-08 — End: 2024-04-08
  Administered 2024-04-08: 100 mL via INTRAVENOUS

## 2024-04-08 MED ORDER — ONDANSETRON HCL 4 MG/2ML IJ SOLN
4.0000 mg | Freq: Once | INTRAMUSCULAR | Status: AC
  Administered 2024-04-08: 4 mg via INTRAVENOUS
  Filled 2024-04-08: qty 2

## 2024-04-08 NOTE — Telephone Encounter (Signed)
Noted  -LS

## 2024-04-08 NOTE — ED Provider Notes (Signed)
 [place] [facility] Provider Note   CSN: [id number] Arrival date & time: 04/08/24  1806     Patient presents with: Abdominal Pain and Emesis   Melissa Knight is a 89 y.o. female.   89 year old female presents for evaluation of abdominal pain.  States on Friday she started having some nausea vomiting and diarrhea, got a little better and then today got much worse.  Has had vomiting and diarrhea again today and also significant cramping abdominal pain.  States it is all over but worse in the bilateral lower quadrants.  Denies any other symptoms or concerns at this time.   Abdominal Pain Associated symptoms: diarrhea, nausea and vomiting   Associated symptoms: no chest pain, no chills, no cough, no dysuria, no fever, no hematuria, no shortness of breath and no sore throat   Emesis Associated symptoms: abdominal pain and diarrhea   Associated symptoms: no arthralgias, no chills, no cough, no fever and no sore throat        Prior to Admission medications   Medication Sig Start Date End Date Taking? Authorizing Provider  amLODipine  (NORVASC ) 2.5 MG tablet Take 1 tablet (2.5 mg total) by mouth daily. 10/27/23   Gladis Mary-Margaret, FNP  atorvastatin  (LIPITOR) 10 MG tablet Take 1 tablet (10 mg total) by mouth daily at 6 PM. 10/27/23   [name], FNP  betamethasone  dipropionate 0.05 % cream Apply daily as needed to affected area 06/11/20   [name], FNP  calcium -vitamin D (OSCAL WITH D) 500-200 MG-UNIT tablet Take 1 tablet by mouth.    [provider]  folic acid  (FOLVITE ) 1 MG tablet TAKE 1 TABLET BY MOUTH EVERY DAY 04/02/24   [name], FNP  furosemide  (LASIX ) 20 MG tablet TAKE 1 TABLET EVERY DAY AS NEEDED 10/27/23   Gladis, Mary-Margaret, FNP  levETIRAcetam  (KEPPRA ) 500 MG tablet Take 1 tablet (500 mg total) by mouth daily. 10/27/23   Gladis Mary-Margaret, FNP  levothyroxine  (SYNTHROID ) 88 MCG tablet Take 1  tablet (88 mcg total) by mouth daily. 03/19/24   [name], FNP  lisinopril  (ZESTRIL ) 20 MG tablet TAKE 1 TABLET BY MOUTH TWICE A DAY 02/23/24   Gladis, Mary-Margaret, FNP  metoprolol  tartrate (LOPRESSOR ) 50 MG tablet TAKE 2 TABLETS BY MOUTH IN MORNING AND 1 TABLET BY MOUTH EVERY NIGHT AT BEDTIME 10/27/23   Gladis, Mary-Margaret, FNP  TURMERIC PO Take by mouth.    [provider]  warfarin (COUMADIN ) 7.5 MG tablet Take 1 tablet (7.5 mg total) by mouth one time only at 6 PM. 10/27/23   Gladis, Mary-Margaret, FNP    Allergies: Sulfa antibiotics    Review of Systems  Constitutional:  Negative for chills and fever.  HENT:  Negative for ear pain and sore throat.   Eyes:  Negative for pain and visual disturbance.  Respiratory:  Negative for cough and shortness of breath.   Cardiovascular:  Negative for chest pain and palpitations.  Gastrointestinal:  Positive for abdominal pain, diarrhea, nausea and vomiting.  Genitourinary:  Negative for dysuria and hematuria.  Musculoskeletal:  Negative for arthralgias and back pain.  Skin:  Negative for color change and rash.  Neurological:  Negative for seizures and syncope.  All other systems reviewed and are negative.   Updated Vital Signs BP 132/70   Pulse 76   Temp 97.6 F (36.4 C) (Oral)   Resp (!) 23   Ht 5' 2 (1.575 m)   Wt 65.8 kg   SpO2 95%  BMI 26.52 kg/m   Physical Exam Vitals and nursing note reviewed.  Constitutional:      General: She is not in acute distress.    Appearance: She is well-developed.  HENT:     Head: Normocephalic and atraumatic.  Eyes:     Conjunctiva/sclera: Conjunctivae normal.  Cardiovascular:     Rate and Rhythm: Normal rate and regular rhythm.     Heart sounds: No murmur heard. Pulmonary:     Effort: Pulmonary effort is normal. No respiratory distress.     Breath sounds: Normal breath sounds.  Abdominal:     General: There is distension.     Palpations: Abdomen is soft.      Tenderness: There is generalized abdominal tenderness.     Comments: Patient has diffuse abdominal tenderness to palpation, worse in the left upper quadrant  Musculoskeletal:        General: No swelling.     Cervical back: Neck supple.  Skin:    General: Skin is warm and dry.     Capillary Refill: Capillary refill takes less than 2 seconds.  Neurological:     Mental Status: She is alert.  Psychiatric:        Mood and Affect: Mood normal.     (all labs ordered are listed, but only abnormal results are displayed) Labs Reviewed  COMPREHENSIVE METABOLIC PANEL WITH GFR - Abnormal; Notable for the following components:      Result Value   Potassium 3.1 (*)    CO2 21 (*)    Total Protein 6.4 (*)    Anion gap 17 (*)    All other components within normal limits  CBC - Abnormal; Notable for the following components:   WBC 3.2 (*)    All other components within normal limits  URINALYSIS, ROUTINE W REFLEX MICROSCOPIC - Abnormal; Notable for the following components:   APPearance HAZY (*)    Hgb urine dipstick LARGE (*)    Ketones, ur 80 (*)    Nitrite POSITIVE (*)    Leukocytes,Ua SMALL (*)    All other components within normal limits  OCCULT BLOOD X 1 CARD TO LAB, STOOL - Abnormal; Notable for the following components:   Fecal Occult Bld POSITIVE (*)    All other components within normal limits  URINALYSIS, MICROSCOPIC (REFLEX) - Abnormal; Notable for the following components:   Bacteria, UA MANY (*)    All other components within normal limits  C DIFFICILE QUICK SCREEN W PCR REFLEX    LIPASE, BLOOD  LACTIC ACID, PLASMA  POC OCCULT BLOOD, ED    EKG: None  Radiology: CT ABDOMEN PELVIS W CONTRAST Result Date: 04/08/2024 CLINICAL DATA:  Abdominal pain, diarrhea, vomiting, bilateral lower quadrant pain. Symptoms began on Friday. EXAM: CT ABDOMEN AND PELVIS WITH CONTRAST TECHNIQUE: Multidetector CT imaging of the abdomen and pelvis was performed using the standard protocol  following bolus administration of intravenous contrast. RADIATION DOSE REDUCTION: This exam was performed according to the departmental dose-optimization program which includes automated exposure control, adjustment of the mA and/or kV according to patient size and/or use of iterative reconstruction technique. CONTRAST:  OMNIPAQUE  IOHEXOL  300 MG/ML  SOLN COMPARISON:  None Available. FINDINGS: Lower chest: Motion artifact limits examination. Tiny low-density pulmonary nodules are demonstrated. Perhaps the most prominent is in the right lower lung, series 5, image 14, measuring 4 mm in diameter. Linear scarring in the right base. Hepatobiliary: Scattered calcifications in the liver, likely granulomas. Gallbladder and bile ducts are normal. Pancreas:  Unremarkable. No pancreatic ductal dilatation or surrounding inflammatory changes. Spleen: Multiple calcified granulomas in the spleen. Adrenals/Urinary Tract: No adrenal gland nodules. Lower pole left renal cyst measuring 4.4 cm diameter. No imaging follow-up is indicated. Nephrograms are symmetrical. No hydronephrosis or hydroureter. Bladder is normal for degree of distention. Stomach/Bowel: Stomach and small bowel are mostly decompressed. Small duodenal diverticulum. Liquid stool demonstrated throughout the colon. The right hemicolon is moderately distended. There is an annular constricting mass in the mid transverse colon likely representing colon cancer. The appendix is normal. Vascular/Lymphatic: Aortic atherosclerosis. No enlarged abdominal or pelvic lymph nodes. Reproductive: Uterus and bilateral adnexa are unremarkable. Other: Small amount of free fluid in the upper abdomen and pelvis is probably reactive. No free air. Abdominal wall musculature appears intact. Musculoskeletal: Degenerative changes in the spine. No acute bony abnormalities. No destructive bone lesions. IMPRESSION: 1. There is an annular constricting lesion in the mid transverse colon likely  representing colon cancer. There is mild proximal colonic dilatation. 2. Liquid stool in the colon likely representing infectious colitis. 3. Appendix is normal. 4. Aortic atherosclerosis. 5. Multiple pulmonary nodules, largest measuring 4 mm. In the setting of probable neoplasm, metastatic disease is not excluded although still unlikely. Oncological follow-up is recommended. Electronically Signed   By: Elsie Gravely M.D.   On: 04/08/2024 20:49     Procedures   Medications Ordered in the ED  lactated ringers  bolus 1,000 mL (0 mLs Intravenous Stopped 04/08/24 2100)  ondansetron  (ZOFRAN ) injection 4 mg (4 mg Intravenous Given 04/08/24 1957)  fentaNYL  (SUBLIMAZE ) injection 25 mcg (25 mcg Intravenous Given 04/08/24 1958)  iohexol  (OMNIPAQUE ) 300 MG/ML solution 100 mL (100 mLs Intravenous Contrast Given 04/08/24 2018)  potassium chloride  SA (KLOR-CON  M) CR tablet 40 mEq (40 mEq Oral Given 04/08/24 2224)                                    Medical Decision Making Patient here for ongoing diarrhea with worsening abdominal pain.  Labs ordered and reviewed and fairly unremarkable except for some mild hypokalemia UTI and mild leukopenia.  CT scan of the abdomen pelvis shows evidence of possible new colonic mass concerning for cancer with some possible metastatic disease to the lungs and likely early bowel obstruction.  This was discussed with GI and hospitalist.  Patient will be admitted for further workup and management.  She was given fentanyl  Zofran  and fluids and feeling much better.  All results and plan discussed with patient and family at bedside.  They are agreeable with plan for admission.  Problems Addressed: Colitis: undiagnosed new problem with uncertain prognosis Colonic mass: undiagnosed new problem with uncertain prognosis Hypokalemia: acute illness or injury Intestinal obstruction, unspecified cause, unspecified whether partial or complete (HCC): undiagnosed new problem with uncertain  prognosis Urinary tract infection without hematuria, site unspecified: acute illness or injury  Amount and/or Complexity of Data Reviewed External Data Reviewed: notes.    Details: Outpatient records reviewed and patient was seen a couple days ago at her primary care doctor's office for diarrhea Labs: ordered. Decision-making details documented in ED Course.    Details: Ordered and reviewed by me and show some hypokalemia, UTI and leukopenia Radiology: ordered and independent interpretation performed. Decision-making details documented in ED Course.    Details: Ordered and reviewed and discussed with GI CT abdomen pelvis shows evidence of new colonic mass and possible bowel obstruction Discussion of management or test  interpretation with external provider(s): Dr. Burnette - GI -I spoke with him regarding the patient reviewed CT scan results and he recommended admission as patient has early bowel obstruction Dr. Charlton - hospitalist -spoke with her regarding the patient patient was admitted for further workup and management.  Risk OTC drugs. Prescription drug management. Parenteral controlled substances. Drug therapy requiring intensive monitoring for toxicity. Decision regarding hospitalization.     Final diagnoses:  Intestinal obstruction, unspecified cause, unspecified whether partial or complete [facility])  Colonic mass  Urinary tract infection without hematuria, site unspecified  Colitis  Hypokalemia    ED Discharge Orders     None          Gennaro Duwaine CROME, DO 04/08/24 2315

## 2024-04-08 NOTE — Progress Notes (Signed)
 Plan of Care Note for accepted transfer   Patient: Melissa Knight MRN: [id number]   DOA: 04/08/2024  Facility requesting transfer: [facility]   Requesting Provider: Dr. Gennaro   Reason for transfer: Colon mass, N/V/D  Facility course: 89 yr old female with HTN, HLD, CAD, seizure, and atrial fibrillation on coumadin  who presents with abdominal pain and N/V/D.   CT demonstrates lesion in transverse colon with proximal colonic dilatation, liquid stool in colon, and multiple lung nodules.   ED physician consulted GI (Dr. Burnette) and administered IVF, potassium, Zofran , and fentanyl .    Plan of care: The patient is accepted for admission to Med-surg  unit, at [facility].   Author: Evalene GORMAN Sprinkles, MD 04/08/2024  Check [email or web address] for on-call coverage.  Nursing staff, Please call [facility] Admits & Consults System-Wide number on Amion as soon as patient's arrival, so appropriate admitting provider can evaluate the pt.

## 2024-04-08 NOTE — ED Triage Notes (Signed)
 Pt reports mid ABD cramping pain with n/v/d that started on Friday, denies emesis today, LBM was liquid yesterday, denies fever

## 2024-04-08 NOTE — Telephone Encounter (Signed)
 FYI Only or Action Required?: FYI only for provider.  Patient was last seen in primary care on 03/25/2024 by [name], FNP.  Called Nurse Triage reporting Abdominal Cramping.  Symptoms began Friday.  Interventions attempted: Rest, hydration, or home remedies.  Symptoms are: unchanged.  Triage Disposition: Go to ED Now (Notify PCP)  Patient/caregiver understands and will follow disposition?: Yes  Copied from CRM [id number]. Topic: Clinical - Red Word Triage >> Apr 08, 2024  3:12 PM [name] wrote: Red Word that prompted transfer to Nurse Triage: severe cramps, diarrhea and nausea. No fever. Reason for Disposition  [1] SEVERE pain (e.g., excruciating) AND [2] present > 1 hour  Answer Assessment - Initial Assessment Questions 1. LOCATION: Where does it hurt?      Generalized abdominal pain 2. RADIATION: Does the pain shoot anywhere else? (e.g., chest, back)     no 3. ONSET: When did the pain begin? (e.g., minutes, hours or days ago)      Started Friday 4. SUDDEN: Gradual or sudden onset?     Gradual 5. PATTERN Does the pain come and go, or is it constant?     Comes and goes 6. SEVERITY: How bad is the pain?  (e.g., Scale 1-10; mild, moderate, or severe)     Severe 7. RECURRENT SYMPTOM: Have you ever had this type of stomach pain before? If Yes, ask: When was the last time? and What happened that time?      no 8. CAUSE: What do you think is causing the stomach pain? (e.g., gallstones, recent abdominal surgery)     unsure 9. RELIEVING/AGGRAVATING FACTORS: What makes it better or worse? (e.g., antacids, bending or twisting motion, bowel movement)     Patient unable to say 10. OTHER SYMPTOMS: Do you have any other symptoms? (e.g., back pain, diarrhea, fever, urination pain, vomiting)       Diarrhea but hasn't had any since yesterday, nausea, vomiting  Protocols used: Abdominal Pain - Female-A-AH

## 2024-04-09 ENCOUNTER — Encounter (HOSPITAL_COMMUNITY): Payer: Self-pay | Admitting: Family Medicine

## 2024-04-09 DIAGNOSIS — Z8249 Family history of ischemic heart disease and other diseases of the circulatory system: Secondary | ICD-10-CM | POA: Diagnosis not present

## 2024-04-09 DIAGNOSIS — I4821 Permanent atrial fibrillation: Secondary | ICD-10-CM | POA: Diagnosis present

## 2024-04-09 DIAGNOSIS — Z87891 Personal history of nicotine dependence: Secondary | ICD-10-CM | POA: Diagnosis not present

## 2024-04-09 DIAGNOSIS — K625 Hemorrhage of anus and rectum: Secondary | ICD-10-CM | POA: Diagnosis not present

## 2024-04-09 DIAGNOSIS — D689 Coagulation defect, unspecified: Secondary | ICD-10-CM | POA: Insufficient documentation

## 2024-04-09 DIAGNOSIS — R634 Abnormal weight loss: Secondary | ICD-10-CM | POA: Diagnosis not present

## 2024-04-09 DIAGNOSIS — Z79890 Hormone replacement therapy (postmenopausal): Secondary | ICD-10-CM | POA: Diagnosis not present

## 2024-04-09 DIAGNOSIS — C772 Secondary and unspecified malignant neoplasm of intra-abdominal lymph nodes: Secondary | ICD-10-CM | POA: Diagnosis present

## 2024-04-09 DIAGNOSIS — C189 Malignant neoplasm of colon, unspecified: Secondary | ICD-10-CM | POA: Diagnosis not present

## 2024-04-09 DIAGNOSIS — Z882 Allergy status to sulfonamides status: Secondary | ICD-10-CM | POA: Diagnosis not present

## 2024-04-09 DIAGNOSIS — I10 Essential (primary) hypertension: Secondary | ICD-10-CM | POA: Diagnosis present

## 2024-04-09 DIAGNOSIS — K56609 Unspecified intestinal obstruction, unspecified as to partial versus complete obstruction: Secondary | ICD-10-CM | POA: Diagnosis present

## 2024-04-09 DIAGNOSIS — Z8673 Personal history of transient ischemic attack (TIA), and cerebral infarction without residual deficits: Secondary | ICD-10-CM

## 2024-04-09 DIAGNOSIS — E039 Hypothyroidism, unspecified: Secondary | ICD-10-CM | POA: Diagnosis present

## 2024-04-09 DIAGNOSIS — E876 Hypokalemia: Secondary | ICD-10-CM | POA: Insufficient documentation

## 2024-04-09 DIAGNOSIS — D125 Benign neoplasm of sigmoid colon: Secondary | ICD-10-CM | POA: Diagnosis not present

## 2024-04-09 DIAGNOSIS — Z515 Encounter for palliative care: Secondary | ICD-10-CM | POA: Diagnosis not present

## 2024-04-09 DIAGNOSIS — R933 Abnormal findings on diagnostic imaging of other parts of digestive tract: Secondary | ICD-10-CM | POA: Diagnosis not present

## 2024-04-09 DIAGNOSIS — N390 Urinary tract infection, site not specified: Secondary | ICD-10-CM | POA: Diagnosis present

## 2024-04-09 DIAGNOSIS — I4891 Unspecified atrial fibrillation: Secondary | ICD-10-CM | POA: Diagnosis not present

## 2024-04-09 DIAGNOSIS — C184 Malignant neoplasm of transverse colon: Secondary | ICD-10-CM | POA: Diagnosis present

## 2024-04-09 DIAGNOSIS — D123 Benign neoplasm of transverse colon: Secondary | ICD-10-CM | POA: Diagnosis not present

## 2024-04-09 DIAGNOSIS — I959 Hypotension, unspecified: Secondary | ICD-10-CM | POA: Diagnosis not present

## 2024-04-09 DIAGNOSIS — R531 Weakness: Secondary | ICD-10-CM | POA: Diagnosis not present

## 2024-04-09 DIAGNOSIS — K5730 Diverticulosis of large intestine without perforation or abscess without bleeding: Secondary | ICD-10-CM | POA: Diagnosis not present

## 2024-04-09 DIAGNOSIS — R627 Adult failure to thrive: Secondary | ICD-10-CM | POA: Diagnosis present

## 2024-04-09 DIAGNOSIS — D630 Anemia in neoplastic disease: Secondary | ICD-10-CM | POA: Diagnosis present

## 2024-04-09 DIAGNOSIS — G40419 Other generalized epilepsy and epileptic syndromes, intractable, without status epilepticus: Secondary | ICD-10-CM | POA: Diagnosis present

## 2024-04-09 DIAGNOSIS — I700 Atherosclerosis of aorta: Secondary | ICD-10-CM | POA: Diagnosis present

## 2024-04-09 DIAGNOSIS — Z01810 Encounter for preprocedural cardiovascular examination: Secondary | ICD-10-CM | POA: Diagnosis not present

## 2024-04-09 DIAGNOSIS — K648 Other hemorrhoids: Secondary | ICD-10-CM | POA: Diagnosis not present

## 2024-04-09 DIAGNOSIS — I679 Cerebrovascular disease, unspecified: Secondary | ICD-10-CM | POA: Diagnosis not present

## 2024-04-09 DIAGNOSIS — K921 Melena: Secondary | ICD-10-CM | POA: Diagnosis not present

## 2024-04-09 DIAGNOSIS — Z743 Need for continuous supervision: Secondary | ICD-10-CM | POA: Diagnosis not present

## 2024-04-09 DIAGNOSIS — E440 Moderate protein-calorie malnutrition: Secondary | ICD-10-CM | POA: Diagnosis present

## 2024-04-09 DIAGNOSIS — E785 Hyperlipidemia, unspecified: Secondary | ICD-10-CM | POA: Diagnosis present

## 2024-04-09 DIAGNOSIS — Z7189 Other specified counseling: Secondary | ICD-10-CM | POA: Diagnosis not present

## 2024-04-09 DIAGNOSIS — K6389 Other specified diseases of intestine: Secondary | ICD-10-CM | POA: Diagnosis not present

## 2024-04-09 DIAGNOSIS — K529 Noninfective gastroenteritis and colitis, unspecified: Secondary | ICD-10-CM | POA: Diagnosis present

## 2024-04-09 DIAGNOSIS — K5669 Other partial intestinal obstruction: Secondary | ICD-10-CM | POA: Diagnosis present

## 2024-04-09 DIAGNOSIS — I2510 Atherosclerotic heart disease of native coronary artery without angina pectoris: Secondary | ICD-10-CM | POA: Diagnosis present

## 2024-04-09 DIAGNOSIS — R319 Hematuria, unspecified: Secondary | ICD-10-CM | POA: Diagnosis not present

## 2024-04-09 DIAGNOSIS — T45515A Adverse effect of anticoagulants, initial encounter: Secondary | ICD-10-CM | POA: Diagnosis present

## 2024-04-09 DIAGNOSIS — Z7901 Long term (current) use of anticoagulants: Secondary | ICD-10-CM | POA: Diagnosis not present

## 2024-04-09 LAB — CBC
HCT: 33.1 % — ABNORMAL LOW (ref 36.0–46.0)
HCT: 31 % — ABNORMAL LOW (ref 36.0–46.0)
HCT: 32.6 % — ABNORMAL LOW (ref 36.0–46.0)
Hemoglobin: 10.5 g/dL — ABNORMAL LOW (ref 12.0–15.0)
Hemoglobin: 9.8 g/dL — ABNORMAL LOW (ref 12.0–15.0)
Hemoglobin: 10.1 g/dL — ABNORMAL LOW (ref 12.0–15.0)
MCH: 29.5 pg (ref 26.0–34.0)
MCH: 29.3 pg (ref 26.0–34.0)
MCH: 29 pg (ref 26.0–34.0)
MCHC: 31.7 g/dL (ref 30.0–36.0)
MCHC: 31.6 g/dL (ref 30.0–36.0)
MCHC: 31 g/dL (ref 30.0–36.0)
MCV: 93 fL (ref 80.0–100.0)
MCV: 92.5 fL (ref 80.0–100.0)
MCV: 93.7 fL (ref 80.0–100.0)
Platelets: 162 K/uL (ref 150–400)
Platelets: 152 K/uL (ref 150–400)
Platelets: 168 K/uL (ref 150–400)
RBC: 3.56 MIL/uL — ABNORMAL LOW (ref 3.87–5.11)
RBC: 3.35 MIL/uL — ABNORMAL LOW (ref 3.87–5.11)
RBC: 3.48 MIL/uL — ABNORMAL LOW (ref 3.87–5.11)
RDW: 13.4 % (ref 11.5–15.5)
RDW: 13.5 % (ref 11.5–15.5)
RDW: 13.7 % (ref 11.5–15.5)
WBC: 3.9 K/uL — ABNORMAL LOW (ref 4.0–10.5)
WBC: 4.2 K/uL (ref 4.0–10.5)
WBC: 4 K/uL (ref 4.0–10.5)
nRBC: 0 % (ref 0.0–0.2)
nRBC: 0 % (ref 0.0–0.2)
nRBC: 0 % (ref 0.0–0.2)

## 2024-04-09 LAB — C DIFFICILE QUICK SCREEN W PCR REFLEX
C Diff antigen: NEGATIVE
C Diff interpretation: NOT DETECTED
C Diff toxin: NEGATIVE

## 2024-04-09 LAB — TYPE AND SCREEN
ABO/RH(D): AB NEG
Antibody Screen: NEGATIVE

## 2024-04-09 LAB — HEPATIC FUNCTION PANEL
ALT: 9 U/L (ref 0–44)
AST: 14 U/L — ABNORMAL LOW (ref 15–41)
Albumin: 2.7 g/dL — ABNORMAL LOW (ref 3.5–5.0)
Alkaline Phosphatase: 27 U/L — ABNORMAL LOW (ref 38–126)
Bilirubin, Direct: 0.2 mg/dL (ref 0.0–0.2)
Indirect Bilirubin: 0.8 mg/dL (ref 0.3–0.9)
Total Bilirubin: 1 mg/dL (ref 0.0–1.2)
Total Protein: 5.1 g/dL — ABNORMAL LOW (ref 6.5–8.1)

## 2024-04-09 LAB — PROTIME-INR
INR: 4.7 (ref 0.8–1.2)
Prothrombin Time: 46.1 s — ABNORMAL HIGH (ref 11.4–15.2)

## 2024-04-09 LAB — BASIC METABOLIC PANEL WITH GFR
Anion gap: 11 (ref 5–15)
BUN: 15 mg/dL (ref 8–23)
CO2: 25 mmol/L (ref 22–32)
Calcium: 8.1 mg/dL — ABNORMAL LOW (ref 8.9–10.3)
Chloride: 103 mmol/L (ref 98–111)
Creatinine, Ser: 0.85 mg/dL (ref 0.44–1.00)
GFR, Estimated: >60 mL/min (ref >60)
Glucose, Bld: 83 mg/dL (ref 70–99)
Potassium: 3.9 mmol/L (ref 3.5–5.1)
Sodium: 139 mmol/L (ref 135–145)

## 2024-04-09 LAB — ABO/RH: ABO/RH(D): AB NEG

## 2024-04-09 MED ORDER — POTASSIUM CHLORIDE 20 MEQ PO PACK
20.0000 meq | PACK | Freq: Once | ORAL | Status: AC
  Administered 2024-04-09: 20 meq via ORAL
  Filled 2024-04-09: qty 1

## 2024-04-09 MED ORDER — SODIUM CHLORIDE 0.9 % IV SOLN
1.0000 g | INTRAVENOUS | Status: DC
  Administered 2024-04-09: 1 g via INTRAVENOUS
  Filled 2024-04-09: qty 10

## 2024-04-09 MED ORDER — ONDANSETRON HCL 4 MG/2ML IJ SOLN: 4.0000 mg | Freq: Four times a day (QID) | INTRAMUSCULAR | Status: DC | PRN

## 2024-04-09 MED ORDER — LEVOTHYROXINE SODIUM 88 MCG PO TABS
88.0000 ug | ORAL_TABLET | Freq: Every day | ORAL | Status: DC
  Administered 2024-04-09 – 2024-04-24 (×17): 88 ug via ORAL
  Filled 2024-04-09 (×14): qty 1

## 2024-04-09 MED ORDER — ACETAMINOPHEN 650 MG RE SUPP: 650.0000 mg | Freq: Four times a day (QID) | RECTAL | Status: DC | PRN

## 2024-04-09 MED ORDER — METOPROLOL TARTRATE 50 MG PO TABS
50.0000 mg | ORAL_TABLET | Freq: Every day | ORAL | Status: DC
  Administered 2024-04-09 – 2024-04-23 (×15): 50 mg via ORAL
  Filled 2024-04-09 (×15): qty 1

## 2024-04-09 MED ORDER — SODIUM CHLORIDE 0.9 % IV SOLN: INTRAVENOUS | Status: AC

## 2024-04-09 MED ORDER — BISACODYL 5 MG PO TBEC
10.0000 mg | DELAYED_RELEASE_TABLET | Freq: Once | ORAL | Status: AC
  Administered 2024-04-09: 10 mg via ORAL
  Filled 2024-04-09: qty 2

## 2024-04-09 MED ORDER — METOPROLOL TARTRATE 50 MG PO TABS
100.0000 mg | ORAL_TABLET | Freq: Every day | ORAL | Status: DC
  Administered 2024-04-09 – 2024-04-24 (×11): 100 mg via ORAL
  Filled 2024-04-09 (×16): qty 2

## 2024-04-09 MED ORDER — ONDANSETRON HCL 4 MG PO TABS: 4.0000 mg | ORAL_TABLET | Freq: Four times a day (QID) | ORAL | Status: DC | PRN

## 2024-04-09 MED ORDER — BOOST / RESOURCE BREEZE PO LIQD CUSTOM
1.0000 | Freq: Three times a day (TID) | ORAL | Status: DC
  Administered 2024-04-09 – 2024-04-18 (×18): 1 via ORAL

## 2024-04-09 MED ORDER — METOPROLOL TARTRATE 50 MG PO TABS: 100.0000 mg | ORAL_TABLET | Freq: Every day | ORAL | Status: DC

## 2024-04-09 MED ORDER — ACETAMINOPHEN 325 MG PO TABS: 650.0000 mg | ORAL_TABLET | Freq: Four times a day (QID) | ORAL | Status: DC | PRN

## 2024-04-09 MED ORDER — LEVETIRACETAM 500 MG PO TABS
500.0000 mg | ORAL_TABLET | Freq: Every day | ORAL | Status: DC
  Administered 2024-04-09 – 2024-04-24 (×19): 500 mg via ORAL
  Filled 2024-04-09 (×16): qty 1

## 2024-04-09 MED ORDER — METOPROLOL TARTRATE 50 MG PO TABS: 50.0000 mg | ORAL_TABLET | Freq: Every day | ORAL | Status: DC

## 2024-04-09 MED ORDER — ATORVASTATIN CALCIUM 10 MG PO TABS
10.0000 mg | ORAL_TABLET | Freq: Every day | ORAL | Status: DC
Start: 2024-04-09 — End: 2024-04-24
  Administered 2024-04-09 – 2024-04-23 (×17): 10 mg via ORAL
  Filled 2024-04-09 (×15): qty 1

## 2024-04-09 MED ORDER — PHYTONADIONE 5 MG PO TABS
2.5000 mg | ORAL_TABLET | Freq: Once | ORAL | Status: AC
  Administered 2024-04-09: 2.5 mg via ORAL
  Filled 2024-04-09: qty 1

## 2024-04-09 MED ORDER — AMLODIPINE BESYLATE 5 MG PO TABS
2.5000 mg | ORAL_TABLET | Freq: Every day | ORAL | Status: DC
  Administered 2024-04-09 – 2024-04-24 (×18): 2.5 mg via ORAL
  Filled 2024-04-09 (×16): qty 1

## 2024-04-09 MED ORDER — ONDANSETRON HCL 4 MG/2ML IJ SOLN
4.0000 mg | Freq: Once | INTRAMUSCULAR | Status: AC
  Administered 2024-04-09: 4 mg via INTRAVENOUS
  Filled 2024-04-09: qty 2

## 2024-04-09 MED ORDER — PEG 3350-KCL-NA BICARB-NACL 420 G PO SOLR
4000.0000 mL | Freq: Once | ORAL | Status: AC
  Administered 2024-04-09: 4000 mL via ORAL

## 2024-04-09 NOTE — ED Notes (Signed)
 Called to [facility] and talked to [place]

## 2024-04-09 NOTE — Consult Note (Signed)
 [facility] Consult  Referring Provider: No ref. provider found Primary Care Physician:  [name], FNP Primary Gastroenterologist: [place]  Reason for Consultation: Abnormal CT imaging, chronic diarrhea, blood per rectum  SUBJECTIVE:   HPI: Melissa Knight is a 89 y.o. female with past medical history significant for paroxysmal atrial fibrillation on Coumadin , hypertension.  Presented to hospital with symptoms of nausea, vomiting and diarrhea.  She noted having similar symptoms few weeks prior which went away spontaneously.  She has been experiencing blood per rectum for the past 6 months which she has attributed to hemorrhoids.  No prior colonoscopy.  She has lost roughly 60 pounds over the past 1 year.  She has had abdominal discomfort diffusely.  No nausea or vomiting.  No chest pain or shortness of breath.  She is agreeable to colonoscopy.  Past Medical History:  Diagnosis Date   Acute ischemic stroke (HCC) 06/12/2012   acute right parietal ischemic stroke - likely cardioembolic in setting of AFib => coumadin  started and followed by PCP   Atrial fibrillation (HCC) 06/12/2012   Echocardiogram 06/30/12: EF 60-65%, mild MR, PASP 33.   Coagulopathy (HCC) 04/09/2024   Hyperlipidemia    Hypertension    Hypothyroidism    Lichen sclerosus et atrophicus of the vulva    PONV (postoperative nausea and vomiting)    Seizure (HCC) 06/12/2012   in the setting of acute ischemic stroke   Past Surgical History:  Procedure Laterality Date   BLADDER SURGERY     bladder prolapse   CATARACT EXTRACTION Left 07/2023   CATARACT EXTRACTION W/PHACO Left 08/04/2023   Procedure: CATARACT EXTRACTION PHACO AND INTRAOCULAR LENS PLACEMENT (IOC);  Surgeon: Juli Blunt, MD;  Location: [facility];  Service: Ophthalmology;  Laterality: Left;  CDE: 6.75   TUBAL LIGATION     Prior to Admission medications   Medication Sig Start Date End Date Taking? Authorizing Provider  amLODipine   (NORVASC ) 2.5 MG tablet Take 1 tablet (2.5 mg total) by mouth daily. 10/27/23  Yes Gladis, Mary-Margaret, FNP  atorvastatin  (LIPITOR) 10 MG tablet Take 1 tablet (10 mg total) by mouth daily at 6 PM. 10/27/23  Yes Gladis, Mary-Margaret, FNP  betamethasone  dipropionate 0.05 % cream Apply daily as needed to affected area 06/11/20  Yes Gladis, Mary-Margaret, FNP  calcium -vitamin D (OSCAL WITH D) 500-200 MG-UNIT tablet Take 1 tablet by mouth.   Yes [provider]  folic acid  (FOLVITE ) 1 MG tablet TAKE 1 TABLET BY MOUTH EVERY DAY 04/02/24  Yes Gladis, Mary-Margaret, FNP  furosemide  (LASIX ) 20 MG tablet TAKE 1 TABLET EVERY DAY AS NEEDED 10/27/23  Yes Gladis, Mary-Margaret, FNP  levETIRAcetam  (KEPPRA ) 500 MG tablet Take 1 tablet (500 mg total) by mouth daily. 10/27/23  Yes Martin, Mary-Margaret, FNP  levothyroxine  (SYNTHROID ) 88 MCG tablet Take 1 tablet (88 mcg total) by mouth daily. 03/19/24  Yes Gladis, Mary-Margaret, FNP  lisinopril  (ZESTRIL ) 20 MG tablet TAKE 1 TABLET BY MOUTH TWICE A DAY 02/23/24  Yes Gladis, Mary-Margaret, FNP  metoprolol  tartrate (LOPRESSOR ) 50 MG tablet TAKE 2 TABLETS BY MOUTH IN MORNING AND 1 TABLET BY MOUTH EVERY NIGHT AT BEDTIME 10/27/23  Yes Martin, Mary-Margaret, FNP  TURMERIC PO Take 1 Dose by mouth daily.   Yes [provider]  warfarin (COUMADIN ) 7.5 MG tablet Take 1 tablet (7.5 mg total) by mouth one time only at 6 PM. Patient taking differently: Take 3.75 mg by mouth daily. 10/27/23  Yes Gladis, Mary-Margaret, FNP   Current Facility-Administered Medications  Medication Dose Route Frequency  Provider Last Rate Last Admin   0.9 %  sodium chloride  infusion   Intravenous Continuous Kriss Stagger H, DO       acetaminophen  (TYLENOL ) tablet 650 mg  650 mg Oral Q6H PRN Franky Redia SAILOR, MD       Or   acetaminophen  (TYLENOL ) suppository 650 mg  650 mg Rectal Q6H PRN Franky Redia SAILOR, MD       amLODipine  (NORVASC ) tablet 2.5 mg  2.5 mg Oral Daily Franky Redia SAILOR, MD   2.5 mg at 04/09/24 1001   atorvastatin  (LIPITOR) tablet 10 mg  10 mg Oral q1800 Franky Redia SAILOR, MD       bisacodyl  (DULCOLAX) EC tablet 10 mg  10 mg Oral Once Kriss Stagger DEL, DO       cefTRIAXone  (ROCEPHIN ) 1 g in sodium chloride  0.9 % 100 mL IVPB  1 g Intravenous Q24H Franky Redia SAILOR, MD 200 mL/hr at 04/09/24 0517 1 g at 04/09/24 0517   feeding supplement (BOOST / RESOURCE BREEZE) liquid 1 Container  1 Container Oral TID BM Franky Redia SAILOR, MD   1 Container at 04/09/24 1031   levETIRAcetam  (KEPPRA ) tablet 500 mg  500 mg Oral Daily Franky Redia SAILOR, MD   500 mg at 04/09/24 1001   levothyroxine  (SYNTHROID ) tablet 88 mcg  88 mcg Oral Q0600 Franky Redia SAILOR, MD   88 mcg at 04/09/24 0518   metoprolol  tartrate (LOPRESSOR ) tablet 100 mg  100 mg Oral Daily Franky Redia SAILOR, MD   100 mg at 04/09/24 1001   And   metoprolol  tartrate (LOPRESSOR ) tablet 50 mg  50 mg Oral QHS Franky Redia SAILOR, MD       polyethylene glycol-electrolytes (NuLYTELY ) solution 4,000 mL  4,000 mL Oral Once Kriss Stagger DEL, DO       Allergies as of 04/08/2024 - Review Complete 04/08/2024  Allergen Reaction Noted   Sulfa antibiotics Rash 07/18/2012   Family History  Problem Relation Age of Onset   Atrial fibrillation Mother        pacemaker   Hypertension Mother    Heart disease Sister        MI in 26s   Stroke Sister    Heart disease Father    Heart disease Sister    Heart disease Sister    Hypertension Sister    Anxiety disorder Sister    Heart disease Brother    Cancer Brother        pancreatic   Heart disease Son    Stroke Son 39   Social History   Socioeconomic History   Marital status: Married    Spouse name: Not on file   Number of children: 3   Years of education: 13   Highest education level: Some college, no degree  Occupational History   Occupation: retired    Comment: customer service  Tobacco Use   Smoking status: Former    Current  packs/day: 0.00    Types: Cigarettes    Quit date: 09/30/1978    Years since quitting: 45.5   Smokeless tobacco: Never  Vaping Use   Vaping status: Never Used  Substance and Sexual Activity   Alcohol use: No   Drug use: No   Sexual activity: Not on file  Other Topics Concern   Not on file  Social History Narrative   Not on file   Social Drivers of Health   Financial Resource Strain: Low Risk  (10/26/2023)   Overall Physicist, medical Strain (  CARDIA)    Difficulty of Paying Living Expenses: Not hard at all  Food Insecurity: No Food Insecurity (04/09/2024)   Hunger Vital Sign    Worried About Running Out of Food in the Last Year: Never true    Ran Out of Food in the Last Year: Never true  Transportation Needs: No Transportation Needs (04/09/2024)   PRAPARE - Administrator, Civil Service (Medical): No    Lack of Transportation (Non-Medical): No  Physical Activity: Inactive (10/26/2023)   Exercise Vital Sign    Days of Exercise per Week: 0 days    Minutes of Exercise per Session: 0 min  Stress: No Stress Concern Present (10/26/2023)   [employer] of Occupational Health - Occupational Stress Questionnaire    Feeling of Stress : Only a little  Social Connections: Socially Integrated (04/09/2024)   Social Connection and Isolation Panel    Frequency of Communication with Friends and Family: Twice a week    Frequency of Social Gatherings with Friends and Family: More than three times a week    Attends Religious Services: More than 4 times per year    Active Member of [employer] or Organizations: Yes    Attends Banker Meetings: Never    Marital Status: Married  Catering manager Violence: Not At Risk (04/09/2024)   Humiliation, Afraid, Rape, and Kick questionnaire    Fear of Current or Ex-Partner: No    Emotionally Abused: No    Physically Abused: No    Sexually Abused: No   Review of Systems:  Review of Systems  Constitutional:  Positive for weight loss.   Respiratory:  Negative for shortness of breath.   Cardiovascular:  Negative for chest pain.  Gastrointestinal:  Positive for abdominal pain, blood in stool and diarrhea. Negative for nausea and vomiting.    OBJECTIVE:   Temp:  [97.6 F (36.4 C)-98 F (36.7 C)] 98 F (36.7 C) (07/29 1033) Pulse Rate:  [63-86] 63 (07/29 1033) Resp:  [14-26] 18 (07/29 1033) BP: (110-148)/(59-87) 119/59 (07/29 1033) SpO2:  [93 %-100 %] 100 % (07/29 1033) Weight:  [65.8 kg] 65.8 kg (07/28 1818) Last BM Date : 04/09/24 Physical Exam Constitutional:      General: She is not in acute distress.    Appearance: She is not ill-appearing, toxic-appearing or diaphoretic.  Cardiovascular:     Rate and Rhythm: Normal rate and regular rhythm.  Pulmonary:     Effort: No respiratory distress.     Breath sounds: Normal breath sounds.  Abdominal:     General: Bowel sounds are normal. There is no distension.     Palpations: Abdomen is soft.     Tenderness: There is no abdominal tenderness. There is no guarding.  Musculoskeletal:     Right lower leg: No edema.     Left lower leg: No edema.  Skin:    General: Skin is warm and dry.  Neurological:     Mental Status: She is alert.     Labs: Recent Labs    04/09/24 0320 04/09/24 0613 04/09/24 1057  WBC 3.9* 4.2 4.0  HGB 10.5* 9.8* 10.1*  HCT 33.1* 31.0* 32.6*  PLT 162 152 168   BMET Recent Labs    04/08/24 1822 04/09/24 0320  NA 137 139  K 3.1* 3.9  CL 99 103  CO2 21* 25  GLUCOSE 87 83  BUN 16 15  CREATININE 0.67 0.85  CALCIUM  9.3 8.1*   LFT Recent Labs    04/09/24  0320  PROT 5.1*  ALBUMIN 2.7*  AST 14*  ALT 9  ALKPHOS 27*  BILITOT 1.0  BILIDIR 0.2  IBILI 0.8   PT/INR Recent Labs    04/09/24 0320  LABPROT 46.1*  INR 4.7*    Diagnostic imaging: CT ABDOMEN PELVIS W CONTRAST Result Date: 04/08/2024 CLINICAL DATA:  Abdominal pain, diarrhea, vomiting, bilateral lower quadrant pain. Symptoms began on Friday. EXAM: CT ABDOMEN AND  PELVIS WITH CONTRAST TECHNIQUE: Multidetector CT imaging of the abdomen and pelvis was performed using the standard protocol following bolus administration of intravenous contrast. RADIATION DOSE REDUCTION: This exam was performed according to the departmental dose-optimization program which includes automated exposure control, adjustment of the mA and/or kV according to patient size and/or use of iterative reconstruction technique. CONTRAST:  OMNIPAQUE  IOHEXOL  300 MG/ML  SOLN COMPARISON:  None Available. FINDINGS: Lower chest: Motion artifact limits examination. Tiny low-density pulmonary nodules are demonstrated. Perhaps the most prominent is in the right lower lung, series 5, image 14, measuring 4 mm in diameter. Linear scarring in the right base. Hepatobiliary: Scattered calcifications in the liver, likely granulomas. Gallbladder and bile ducts are normal. Pancreas: Unremarkable. No pancreatic ductal dilatation or surrounding inflammatory changes. Spleen: Multiple calcified granulomas in the spleen. Adrenals/Urinary Tract: No adrenal gland nodules. Lower pole left renal cyst measuring 4.4 cm diameter. No imaging follow-up is indicated. Nephrograms are symmetrical. No hydronephrosis or hydroureter. Bladder is normal for degree of distention. Stomach/Bowel: Stomach and small bowel are mostly decompressed. Small duodenal diverticulum. Liquid stool demonstrated throughout the colon. The right hemicolon is moderately distended. There is an annular constricting mass in the mid transverse colon likely representing colon cancer. The appendix is normal. Vascular/Lymphatic: Aortic atherosclerosis. No enlarged abdominal or pelvic lymph nodes. Reproductive: Uterus and bilateral adnexa are unremarkable. Other: Small amount of free fluid in the upper abdomen and pelvis is probably reactive. No free air. Abdominal wall musculature appears intact. Musculoskeletal: Degenerative changes in the spine. No acute bony  abnormalities. No destructive bone lesions. IMPRESSION: 1. There is an annular constricting lesion in the mid transverse colon likely representing colon cancer. There is mild proximal colonic dilatation. 2. Liquid stool in the colon likely representing infectious colitis. 3. Appendix is normal. 4. Aortic atherosclerosis. 5. Multiple pulmonary nodules, largest measuring 4 mm. In the setting of probable neoplasm, metastatic disease is not excluded although still unlikely. Oncological follow-up is recommended. Electronically Signed   By: Elsie Gravely M.D.   On: 04/08/2024 20:49   IMPRESSION: Abnormal CT imaging  -Annular constricting mass in mid transverse colon -Multiple pulmonary nodules query metastatic disease Blood per rectum Chronic diarrhea Unintentional weight loss Paroxysmal atrial fibrillation on Coumadin  Supratherapeutic INR Hypertension  PLAN: -Recommend colonoscopy to further evaluate abnormal CT imaging, blood per rectum and unintentional weight loss -Discussed procedure with patient including benefits, alternatives and risks of bleeding/infection/perforation/missed lesion/anesthesia, she verbalized understanding and elected to proceed -Supratherapeutic INR, aim for INR less than 1.7 for procedure, hold Coumadin , trend INR in a.m. -Clear liquid diet today, bowel preparation this afternoon, n.p.o. at midnight for procedure tomorrow if able   LOS: 0 days   Estefana Keas, DO [facility]

## 2024-04-09 NOTE — Plan of Care (Signed)

## 2024-04-09 NOTE — H&P (Addendum)
 History and Physical    Melissa Knight FMW:[id number] DOB: 06-27-1933 DOA: 04/08/2024  Patient coming from: Home.  Chief Complaint: Abdominal pain with nausea vomiting and diarrhea.  HPI: Melissa Knight is a 89 y.o. female with history of permanent atrial fibrillation, hypertension, hypothyroidism, embolic stroke presents to the ER with complaints of abdominal pain with nausea vomiting and diarrhea.  Patient has been having the symptoms for the last 3 days.  Patient had similar symptoms about a month ago which was self-limited.  Patient states she has lost at least 50 pounds in the last 1 year.  In the last 3 days she has been noticing increasing crampy abdominal pain with diarrhea which was bloody.  Denies any fever or chills.  ED Course: In the ER CT abdomen pelvis shows features concerning for colonic mass in the transverse colon concerning for colon cancer.  There is also lung nodules in the chest fields concerning for possible metastasis.  Labs show potassium of 3.1 hemoglobin 12.3 WBC 3.2 INR 4.4.  UA shows ketones small positive nitrate and esterase and many bacteria.  Patient admitted for further workup of colonic mass.  Review of Systems: As per HPI, rest all negative.   Past Medical History:  Diagnosis Date   Acute ischemic stroke (HCC) 06/12/2012   acute right parietal ischemic stroke - likely cardioembolic in setting of AFib => coumadin  started and followed by PCP   Atrial fibrillation (HCC) 06/12/2012   Echocardiogram 06/30/12: EF 60-65%, mild MR, PASP 33.   Coagulopathy (HCC) 04/09/2024   Hyperlipidemia    Hypertension    Hypothyroidism    Lichen sclerosus et atrophicus of the vulva    PONV (postoperative nausea and vomiting)    Seizure (HCC) 06/12/2012   in the setting of acute ischemic stroke    Past Surgical History:  Procedure Laterality Date   BLADDER SURGERY     bladder prolapse   CATARACT EXTRACTION Left 07/2023   CATARACT EXTRACTION W/PHACO Left 08/04/2023    Procedure: CATARACT EXTRACTION PHACO AND INTRAOCULAR LENS PLACEMENT (IOC);  Surgeon: Juli Blunt, MD;  Location: [facility];  Service: Ophthalmology;  Laterality: Left;  CDE: 6.75   TUBAL LIGATION       reports that she quit smoking about 45 years ago. Her smoking use included cigarettes. She has never used smokeless tobacco. She reports that she does not drink alcohol and does not use drugs.  Allergies  Allergen Reactions   Sulfa Antibiotics Rash    rash    Family History  Problem Relation Age of Onset   Atrial fibrillation Mother        pacemaker   Hypertension Mother    Heart disease Sister        MI in 34s   Stroke Sister    Heart disease Father    Heart disease Sister    Heart disease Sister    Hypertension Sister    Anxiety disorder Sister    Heart disease Brother    Cancer Brother        pancreatic   Heart disease Son    Stroke Son 50    Prior to Admission medications   Medication Sig Start Date End Date Taking? Authorizing Provider  amLODipine  (NORVASC ) 2.5 MG tablet Take 1 tablet (2.5 mg total) by mouth daily. 10/27/23   Gladis Mary-Margaret, FNP  atorvastatin  (LIPITOR) 10 MG tablet Take 1 tablet (10 mg total) by mouth daily at 6 PM. 10/27/23   [name], FNP  betamethasone  dipropionate 0.05 %  cream Apply daily as needed to affected area 06/11/20   [name], FNP  calcium -vitamin D (OSCAL WITH D) 500-200 MG-UNIT tablet Take 1 tablet by mouth.    [provider]  folic acid  (FOLVITE ) 1 MG tablet TAKE 1 TABLET BY MOUTH EVERY DAY 04/02/24   [name], FNP  furosemide  (LASIX ) 20 MG tablet TAKE 1 TABLET EVERY DAY AS NEEDED 10/27/23   Gladis, Mary-Margaret, FNP  levETIRAcetam  (KEPPRA ) 500 MG tablet Take 1 tablet (500 mg total) by mouth daily. 10/27/23   Gladis Mary-Margaret, FNP  levothyroxine  (SYNTHROID ) 88 MCG tablet Take 1 tablet (88 mcg total) by mouth daily. 03/19/24   [name], FNP  lisinopril  (ZESTRIL ) 20 MG  tablet TAKE 1 TABLET BY MOUTH TWICE A DAY 02/23/24   Gladis, Mary-Margaret, FNP  metoprolol  tartrate (LOPRESSOR ) 50 MG tablet TAKE 2 TABLETS BY MOUTH IN MORNING AND 1 TABLET BY MOUTH EVERY NIGHT AT BEDTIME 10/27/23   Gladis, Mary-Margaret, FNP  TURMERIC PO Take by mouth.    [provider]  warfarin (COUMADIN ) 7.5 MG tablet Take 1 tablet (7.5 mg total) by mouth one time only at 6 PM. 10/27/23   [name], FNP    Physical Exam: Constitutional: Moderately built and nourished. Vitals:   04/08/24 2200 04/08/24 2215 04/08/24 2330 04/09/24 0046  BP: 126/63 132/70 130/60 (!) 145/87  Pulse: 80 76 72 72  Resp: 17 (!) 23 14 15   Temp:    97.6 F (36.4 C)  TempSrc:    Oral  SpO2: 96% 95% 94% 99%  Weight:      Height:       Eyes: Anicteric no pallor. ENMT: No discharge from the ears/nose or mouth. Neck: No mass felt.  No neck rigidity. Respiratory: No rhonchi or crepitations. Cardiovascular: S1-S2 heard. Abdomen: Soft mild epigastric tenderness no guarding or rigidity. Musculoskeletal: No edema.  No Skin: No rash. Neurologic: Alert awake oriented time place and person per moves all extremities. Psychiatric: Appears normal.  Normal affect.   Labs on Admission: I have personally reviewed following labs and imaging studies  CBC: Recent Labs  Lab 04/08/24 1822  WBC 3.2*  HGB 12.3  HCT 37.6  MCV 88.9  PLT 194   Basic Metabolic Panel: Recent Labs  Lab 04/08/24 1822  NA 137  K 3.1*  CL 99  CO2 21*  GLUCOSE 87  BUN 16  CREATININE 0.67  CALCIUM  9.3   GFR: Estimated Creatinine Clearance: 42.4 mL/min (by C-G formula based on SCr of 0.67 mg/dL). Liver Function Tests: Recent Labs  Lab 04/08/24 1822  AST 19  ALT 7  ALKPHOS 38  BILITOT 0.6  PROT 6.4*  ALBUMIN 3.8   Recent Labs  Lab 04/08/24 1822  LIPASE 15   No results for input(s): AMMONIA in the last 168 hours. Coagulation Profile: No results for input(s): INR, PROTIME in the last 168  hours. Cardiac Enzymes: No results for input(s): CKTOTAL, CKMB, CKMBINDEX, TROPONINI in the last 168 hours. BNP (last 3 results) No results for input(s): PROBNP in the last 8760 hours. HbA1C: No results for input(s): HGBA1C in the last 72 hours. CBG: No results for input(s): GLUCAP in the last 168 hours. Lipid Profile: No results for input(s): CHOL, HDL, LDLCALC, TRIG, CHOLHDL, LDLDIRECT in the last 72 hours. Thyroid  Function Tests: No results for input(s): TSH, T4TOTAL, FREET4, T3FREE, THYROIDAB in the last 72 hours. Anemia Panel: No results for input(s): VITAMINB12, FOLATE, FERRITIN, TIBC, IRON, RETICCTPCT in the last 72 hours. Urine analysis:  Component Value Date/Time   COLORURINE YELLOW 04/08/2024 2147   APPEARANCEUR HAZY (A) 04/08/2024 2147   LABSPEC 1.010 04/08/2024 2147   PHURINE 6.0 04/08/2024 2147   GLUCOSEU NEGATIVE 04/08/2024 2147   HGBUR LARGE (A) 04/08/2024 2147   BILIRUBINUR NEGATIVE 04/08/2024 2147   KETONESUR 80 (A) 04/08/2024 2147   PROTEINUR NEGATIVE 04/08/2024 2147   UROBILINOGEN 0.2 06/30/2012 1137   NITRITE POSITIVE (A) 04/08/2024 2147   LEUKOCYTESUR SMALL (A) 04/08/2024 2147   Sepsis Labs: @LABRCNTIP (procalcitonin:4,lacticidven:4) )No results found for this or any previous visit (from the past 240 hours).   Radiological Exams on Admission: CT ABDOMEN PELVIS W CONTRAST Result Date: 04/08/2024 CLINICAL DATA:  Abdominal pain, diarrhea, vomiting, bilateral lower quadrant pain. Symptoms began on Friday. EXAM: CT ABDOMEN AND PELVIS WITH CONTRAST TECHNIQUE: Multidetector CT imaging of the abdomen and pelvis was performed using the standard protocol following bolus administration of intravenous contrast. RADIATION DOSE REDUCTION: This exam was performed according to the departmental dose-optimization program which includes automated exposure control, adjustment of the mA and/or kV according to patient size and/or  use of iterative reconstruction technique. CONTRAST:  OMNIPAQUE  IOHEXOL  300 MG/ML  SOLN COMPARISON:  None Available. FINDINGS: Lower chest: Motion artifact limits examination. Tiny low-density pulmonary nodules are demonstrated. Perhaps the most prominent is in the right lower lung, series 5, image 14, measuring 4 mm in diameter. Linear scarring in the right base. Hepatobiliary: Scattered calcifications in the liver, likely granulomas. Gallbladder and bile ducts are normal. Pancreas: Unremarkable. No pancreatic ductal dilatation or surrounding inflammatory changes. Spleen: Multiple calcified granulomas in the spleen. Adrenals/Urinary Tract: No adrenal gland nodules. Lower pole left renal cyst measuring 4.4 cm diameter. No imaging follow-up is indicated. Nephrograms are symmetrical. No hydronephrosis or hydroureter. Bladder is normal for degree of distention. Stomach/Bowel: Stomach and small bowel are mostly decompressed. Small duodenal diverticulum. Liquid stool demonstrated throughout the colon. The right hemicolon is moderately distended. There is an annular constricting mass in the mid transverse colon likely representing colon cancer. The appendix is normal. Vascular/Lymphatic: Aortic atherosclerosis. No enlarged abdominal or pelvic lymph nodes. Reproductive: Uterus and bilateral adnexa are unremarkable. Other: Small amount of free fluid in the upper abdomen and pelvis is probably reactive. No free air. Abdominal wall musculature appears intact. Musculoskeletal: Degenerative changes in the spine. No acute bony abnormalities. No destructive bone lesions. IMPRESSION: 1. There is an annular constricting lesion in the mid transverse colon likely representing colon cancer. There is mild proximal colonic dilatation. 2. Liquid stool in the colon likely representing infectious colitis. 3. Appendix is normal. 4. Aortic atherosclerosis. 5. Multiple pulmonary nodules, largest measuring 4 mm. In the setting of  probable neoplasm, metastatic disease is not excluded although still unlikely. Oncological follow-up is recommended. Electronically Signed   By: Elsie Gravely M.D.   On: 04/08/2024 20:49      Assessment/Plan Principal Problem:   Colonic mass Active Problems:   Seizure disorder, generalized convulsive, intractable (HCC)   Permanent atrial fibrillation (HCC)   Hypothyroidism (acquired)   Coagulopathy (HCC)   Hypokalemia   History of embolic stroke    Colon mass concerning for colonic malignancy -    [name] Dr. Burnette was consulted by ED physician.  Will keep patient liquid diet for now. Possible UTI on empiric antibiotics.  Follow cultures. Coagulopathy secondary to Coumadin .  INR is supratherapeutic.  Will hold Coumadin  secondary to patient having GI bleed and also in anticipation of procedure.  Patient agreeable. History of A-fib presently rate controlled  continue metoprolol .  Holding Coumadin  secondary to GI bleed and also in anticipation of procedure. Hypertension continue amlodipine  and metoprolol  holding lisinopril  for now. History of stroke continue statins.  Holding Eliquis due to GI bleed and possible need for procedure. Hypokalemia replace and recheck. Hypothyroidism on Synthroid . History of seizure disorder on Keppra .  Patient takes it once a day.  Since patient has colon mass which will need further workup and more than 2 midnight stay.   DVT prophylaxis: SCDs. Code Status: Full code. Family Communication: Discussed with patient. Disposition Plan: Medical floor. Consults called: Gastroenterologist. Admission status: Observation.

## 2024-04-09 NOTE — Plan of Care (Signed)

## 2024-04-09 NOTE — Progress Notes (Signed)
 Day 0 NOTE    Melissa Knight  FMW:[id number] DOB: 02/11/1933 DOA: 04/08/2024 PCP: Melissa Mustard, FNP   Brief Narrative:  As you note, for complete HPI see admission note earlier this morning  Briefly Melissa Knight is a pleasant 89 year old female who presents with tractable nausea vomiting and diarrhea, per imaging she has colonic mass concerning for malignancy with obstructive pathology. [name] following, recommending INR less than 1.7, vitamin K ordered and pending, repeat INR ordered for the morning.  In regards to her diarrhea this appears to be obstructive in nature, not infectious with no fever no white count and no symptoms of GI illness other than liquid stool.  C. difficile stool studies negative, stool PCR pending to ensure no underlying infection.  Patient denies any urinary symptoms, despite abnormal UA without overt signs or symptoms of infection will hold any further antibiotics.   Assessment & Plan:   Principal Problem:   Colonic mass Active Problems:   Seizure disorder, generalized convulsive, intractable (HCC)   Permanent atrial fibrillation (HCC)   Hypothyroidism (acquired)   Coagulopathy (HCC)   Hypokalemia   History of embolic stroke   DVT prophylaxis: SCDs Start: 04/09/24 0249 Code Status:   Code Status: Full Code Family Communication: None present  Status is: Inpatient  Dispo: The patient is from: Home              Anticipated d/c is to: Home              Anticipated d/c date is: 24 to 48 hours              Patient currently not medically stable for discharge  Consultants:  GI  Procedures:  Planned colonoscopy per GI schedule  Antimicrobials:  Discontinued  Subjective: No acute issues or events overnight denies nausea vomiting headache fever chills chest pain shortness of breath  Objective: Vitals:   04/08/24 2215 04/08/24 2330 04/09/24 0046 04/09/24 0406  BP: 132/70 130/60 (!) 145/87 130/68  Pulse: 76 72 72 65  Resp: (!) 23 14 15 15    Temp:   97.6 F (36.4 C) 97.6 F (36.4 C)  TempSrc:   Oral Oral  SpO2: 95% 94% 99% 99%  Weight:      Height:        Intake/Output Summary (Last 24 hours) at 04/09/2024 0704 Last data filed at 04/09/2024 0600 Gross per 24 hour  Intake 220 ml  Output --  Net 220 ml   Filed Weights   04/08/24 1818  Weight: 65.8 kg    Examination:  General:  Pleasantly resting in bed, No acute distress. HEENT:  Normocephalic atraumatic.  Sclerae nonicteric, noninjected.  Extraocular movements intact bilaterally. Neck:  Without mass or deformity.  Trachea is midline. Lungs:  Clear to auscultate bilaterally without rhonchi, wheeze, or rales. Heart:  Regular rate and rhythm.  Without murmurs, rubs, or gallops. Abdomen: Soft, moderately distended, mildly tender to palpate at the lower quadrants. Extremities: Without cyanosis, clubbing, edema, or obvious deformity. Skin:  Warm and dry, no erythema.   Data Reviewed: I have personally reviewed following labs and imaging studies  CBC: Recent Labs  Lab 04/08/24 1822 04/09/24 0320 04/09/24 0613  WBC 3.2* 3.9* 4.2  HGB 12.3 10.5* 9.8*  HCT 37.6 33.1* 31.0*  MCV 88.9 93.0 92.5  PLT 194 162 152   Basic Metabolic Panel: Recent Labs  Lab 04/08/24 1822 04/09/24 0320  NA 137 139  K 3.1* 3.9  CL 99 103  CO2 21* 25  GLUCOSE 87 83  BUN 16 15  CREATININE 0.67 0.85  CALCIUM  9.3 8.1*   GFR: Estimated Creatinine Clearance: 40 mL/min (by C-G formula based on SCr of 0.85 mg/dL). Liver Function Tests: Recent Labs  Lab 04/08/24 1822 04/09/24 0320  AST 19 14*  ALT 7 9  ALKPHOS 38 27*  BILITOT 0.6 1.0  PROT 6.4* 5.1*  ALBUMIN 3.8 2.7*   Recent Labs  Lab 04/08/24 1822  LIPASE 15   No results for input(s): AMMONIA in the last 168 hours. Coagulation Profile: Recent Labs  Lab 04/09/24 0320  INR 4.7*   Sepsis Labs: Recent Labs  Lab 04/08/24 1955  LATICACIDVEN 1.9   No results found for this or any previous visit (from the past  240 hours).   Radiology Studies: CT ABDOMEN PELVIS W CONTRAST Result Date: 04/08/2024 CLINICAL DATA:  Abdominal pain, diarrhea, vomiting, bilateral lower quadrant pain. Symptoms began on Friday. EXAM: CT ABDOMEN AND PELVIS WITH CONTRAST TECHNIQUE: Multidetector CT imaging of the abdomen and pelvis was performed using the standard protocol following bolus administration of intravenous contrast. RADIATION DOSE REDUCTION: This exam was performed according to the departmental dose-optimization program which includes automated exposure control, adjustment of the mA and/or kV according to patient size and/or use of iterative reconstruction technique. CONTRAST:  OMNIPAQUE  IOHEXOL  300 MG/ML  SOLN COMPARISON:  None Available. FINDINGS: Lower chest: Motion artifact limits examination. Tiny low-density pulmonary nodules are demonstrated. Perhaps the most prominent is in the right lower lung, series 5, image 14, measuring 4 mm in diameter. Linear scarring in the right base. Hepatobiliary: Scattered calcifications in the liver, likely granulomas. Gallbladder and bile ducts are normal. Pancreas: Unremarkable. No pancreatic ductal dilatation or surrounding inflammatory changes. Spleen: Multiple calcified granulomas in the spleen. Adrenals/Urinary Tract: No adrenal gland nodules. Lower pole left renal cyst measuring 4.4 cm diameter. No imaging follow-up is indicated. Nephrograms are symmetrical. No hydronephrosis or hydroureter. Bladder is normal for degree of distention. Stomach/Bowel: Stomach and small bowel are mostly decompressed. Small duodenal diverticulum. Liquid stool demonstrated throughout the colon. The right hemicolon is moderately distended. There is an annular constricting mass in the mid transverse colon likely representing colon cancer. The appendix is normal. Vascular/Lymphatic: Aortic atherosclerosis. No enlarged abdominal or pelvic lymph nodes. Reproductive: Uterus and bilateral adnexa are unremarkable.  Other: Small amount of free fluid in the upper abdomen and pelvis is probably reactive. No free air. Abdominal wall musculature appears intact. Musculoskeletal: Degenerative changes in the spine. No acute bony abnormalities. No destructive bone lesions. IMPRESSION: 1. There is an annular constricting lesion in the mid transverse colon likely representing colon cancer. There is mild proximal colonic dilatation. 2. Liquid stool in the colon likely representing infectious colitis. 3. Appendix is normal. 4. Aortic atherosclerosis. 5. Multiple pulmonary nodules, largest measuring 4 mm. In the setting of probable neoplasm, metastatic disease is not excluded although still unlikely. Oncological follow-up is recommended. Electronically Signed   By: Elsie Gravely M.D.   On: 04/08/2024 20:49   Scheduled Meds:  amLODipine   2.5 mg Oral Daily   atorvastatin   10 mg Oral q1800   feeding supplement  1 Container Oral TID BM   levETIRAcetam   500 mg Oral Daily   levothyroxine   88 mcg Oral Q0600   metoprolol  tartrate  100 mg Oral Daily   And   metoprolol  tartrate  50 mg Oral QHS   Continuous Infusions:  cefTRIAXone  (ROCEPHIN )  IV 1 g (04/09/24 0517)     LOS: 0 days  Time spent:  Elsie JAYSON Montclair, DO [facility]  If 7PM-7AM, please contact night-coverage [email or web address]  04/09/2024, 7:04 AM

## 2024-04-09 NOTE — Progress Notes (Signed)
 Date and time results received: 04/09/24 06:30am  Test: INR Critical Value: 4.7  Name of Provider Notified: Jerelene Bunde, NP  Orders Received? Or Actions Taken?: No new orders

## 2024-04-10 DIAGNOSIS — K6389 Other specified diseases of intestine: Secondary | ICD-10-CM | POA: Diagnosis not present

## 2024-04-10 LAB — PROTIME-INR
INR: 4.5 (ref 0.8–1.2)
INR: 3.4 — ABNORMAL HIGH (ref 0.8–1.2)
Prothrombin Time: 44.7 s — ABNORMAL HIGH (ref 11.4–15.2)
Prothrombin Time: 35.7 s — ABNORMAL HIGH (ref 11.4–15.2)

## 2024-04-10 LAB — GASTROINTESTINAL PANEL BY PCR, STOOL (REPLACES STOOL CULTURE)

## 2024-04-10 LAB — BASIC METABOLIC PANEL WITH GFR
Anion gap: 9 (ref 5–15)
BUN: 7 mg/dL — ABNORMAL LOW (ref 8–23)
CO2: 23 mmol/L (ref 22–32)
Calcium: 8 mg/dL — ABNORMAL LOW (ref 8.9–10.3)
Chloride: 105 mmol/L (ref 98–111)
Creatinine, Ser: 0.52 mg/dL (ref 0.44–1.00)
GFR, Estimated: >60 mL/min (ref >60)
Glucose, Bld: 76 mg/dL (ref 70–99)
Potassium: 3.2 mmol/L — ABNORMAL LOW (ref 3.5–5.1)
Sodium: 137 mmol/L (ref 135–145)

## 2024-04-10 LAB — CBC
HCT: 33.7 % — ABNORMAL LOW (ref 36.0–46.0)
Hemoglobin: 10.4 g/dL — ABNORMAL LOW (ref 12.0–15.0)
MCH: 28.7 pg (ref 26.0–34.0)
MCHC: 30.9 g/dL (ref 30.0–36.0)
MCV: 93.1 fL (ref 80.0–100.0)
Platelets: 166 K/uL (ref 150–400)
RBC: 3.62 MIL/uL — ABNORMAL LOW (ref 3.87–5.11)
RDW: 13.5 % (ref 11.5–15.5)
WBC: 4.6 K/uL (ref 4.0–10.5)
nRBC: 0 % (ref 0.0–0.2)

## 2024-04-10 MED ORDER — PHYTONADIONE 5 MG PO TABS
5.0000 mg | ORAL_TABLET | Freq: Once | ORAL | Status: AC
  Administered 2024-04-10: 5 mg via ORAL
  Filled 2024-04-10: qty 1

## 2024-04-10 MED ORDER — ONDANSETRON HCL 4 MG/2ML IJ SOLN
4.0000 mg | Freq: Three times a day (TID) | INTRAMUSCULAR | Status: DC | PRN
  Administered 2024-04-10 – 2024-04-17 (×2): 4 mg via INTRAVENOUS
  Filled 2024-04-10 (×2): qty 2

## 2024-04-10 MED ORDER — ONDANSETRON HCL 4 MG PO TABS
4.0000 mg | ORAL_TABLET | Freq: Three times a day (TID) | ORAL | Status: DC | PRN
  Administered 2024-04-15 – 2024-04-18 (×3): 4 mg via ORAL
  Filled 2024-04-10 (×4): qty 1

## 2024-04-10 MED ORDER — POLYETHYLENE GLYCOL 3350 17 GM/SCOOP PO POWD
238.0000 g | Freq: Once | ORAL | Status: AC
  Administered 2024-04-10: 238 g via ORAL
  Filled 2024-04-10: qty 238

## 2024-04-10 MED ORDER — PHYTONADIONE 5 MG PO TABS
10.0000 mg | ORAL_TABLET | Freq: Once | ORAL | Status: AC
  Administered 2024-04-10: 10 mg via ORAL
  Filled 2024-04-10: qty 2

## 2024-04-10 MED ORDER — POTASSIUM CHLORIDE CRYS ER 20 MEQ PO TBCR
40.0000 meq | EXTENDED_RELEASE_TABLET | ORAL | Status: AC
  Administered 2024-04-10 (×2): 40 meq via ORAL
  Filled 2024-04-10 (×2): qty 2

## 2024-04-10 NOTE — Progress Notes (Signed)
 [facility] Progress Note  SUBJECTIVE:   Interval history: Melissa Knight was seen and evaluated today at bedside.  Resting in chair.  Tolerated some of her bowel prep, is more willing to attempt Gatorade/MiraLAX  bowel preparation.  Discussed reason for delaying colonoscopy given elevated INR.  No current abdominal pain.  Past Medical History:  Diagnosis Date   Acute ischemic stroke (HCC) 06/12/2012   acute right parietal ischemic stroke - likely cardioembolic in setting of AFib => coumadin  started and followed by PCP   Atrial fibrillation (HCC) 06/12/2012   Echocardiogram 06/30/12: EF 60-65%, mild MR, PASP 33.   Coagulopathy (HCC) 04/09/2024   Hyperlipidemia    Hypertension    Hypothyroidism    Lichen sclerosus et atrophicus of the vulva    PONV (postoperative nausea and vomiting)    Seizure (HCC) 06/12/2012   in the setting of acute ischemic stroke   Past Surgical History:  Procedure Laterality Date   BLADDER SURGERY     bladder prolapse   CATARACT EXTRACTION Left 07/2023   CATARACT EXTRACTION W/PHACO Left 08/04/2023   Procedure: CATARACT EXTRACTION PHACO AND INTRAOCULAR LENS PLACEMENT (IOC);  Surgeon: Juli Blunt, MD;  Location: [facility];  Service: Ophthalmology;  Laterality: Left;  CDE: 6.75   TUBAL LIGATION     Current Facility-Administered Medications  Medication Dose Route Frequency Provider Last Rate Last Admin   acetaminophen  (TYLENOL ) tablet 650 mg  650 mg Oral Q6H PRN Franky Redia SAILOR, MD       Or   acetaminophen  (TYLENOL ) suppository 650 mg  650 mg Rectal Q6H PRN Franky Redia SAILOR, MD       amLODipine  (NORVASC ) tablet 2.5 mg  2.5 mg Oral Daily Franky Redia SAILOR, MD   2.5 mg at 04/10/24 9182   atorvastatin  (LIPITOR) tablet 10 mg  10 mg Oral q1800 Kakrakandy, Arshad N, MD   10 mg at 04/09/24 1751   feeding supplement (BOOST / RESOURCE BREEZE) liquid 1 Container  1 Container Oral TID BM Franky Redia SAILOR, MD   1 Container at 04/10/24 9180    levETIRAcetam  (KEPPRA ) tablet 500 mg  500 mg Oral Daily Kakrakandy, Arshad N, MD   500 mg at 04/10/24 9182   levothyroxine  (SYNTHROID ) tablet 88 mcg  88 mcg Oral Q0600 Kakrakandy, Arshad N, MD   88 mcg at 04/10/24 0600   metoprolol  tartrate (LOPRESSOR ) tablet 100 mg  100 mg Oral Daily Franky Redia SAILOR, MD   100 mg at 04/09/24 1001   And   metoprolol  tartrate (LOPRESSOR ) tablet 50 mg  50 mg Oral QHS Franky Redia SAILOR, MD   50 mg at 04/09/24 2121   Allergies as of 04/08/2024 - Review Complete 04/08/2024  Allergen Reaction Noted   Sulfa antibiotics Rash 07/18/2012   Review of Systems:  Review of Systems  Gastrointestinal:  Negative for abdominal pain, nausea and vomiting.    OBJECTIVE:   Temp:  [97.6 F (36.4 C)-97.8 F (36.6 C)] 97.6 F (36.4 C) (07/30 0551) Pulse Rate:  [49-61] 51 (07/30 0551) Resp:  [16-17] 16 (07/30 0551) BP: (110-146)/(58-68) 146/65 (07/30 0551) SpO2:  [98 %-100 %] 100 % (07/30 0551) Last BM Date : 04/09/24 Physical Exam Constitutional:      General: She is not in acute distress.    Appearance: She is not ill-appearing, toxic-appearing or diaphoretic.  Cardiovascular:     Comments: Irregular pulse Pulmonary:     Effort: No respiratory distress.  Abdominal:     General: There is no distension.  Palpations: Abdomen is soft.     Tenderness: There is no abdominal tenderness.  Neurological:     Mental Status: She is alert.     Labs: Recent Labs    04/09/24 0613 04/09/24 1057 04/10/24 0319  WBC 4.2 4.0 4.6  HGB 9.8* 10.1* 10.4*  HCT 31.0* 32.6* 33.7*  PLT 152 168 166   BMET Recent Labs    04/08/24 1822 04/09/24 0320 04/10/24 0319  NA 137 139 137  K 3.1* 3.9 3.2*  CL 99 103 105  CO2 21* 25 23  GLUCOSE 87 83 76  BUN 16 15 7*  CREATININE 0.67 0.85 0.52  CALCIUM  9.3 8.1* 8.0*   LFT Recent Labs    04/09/24 0320  PROT 5.1*  ALBUMIN 2.7*  AST 14*  ALT 9  ALKPHOS 27*  BILITOT 1.0  BILIDIR 0.2  IBILI 0.8   PT/INR Recent  Labs    04/09/24 0320 04/10/24 0319  LABPROT 46.1* 44.7*  INR 4.7* 4.5*   Diagnostic imaging: CT ABDOMEN PELVIS W CONTRAST Result Date: 04/08/2024 CLINICAL DATA:  Abdominal pain, diarrhea, vomiting, bilateral lower quadrant pain. Symptoms began on Friday. EXAM: CT ABDOMEN AND PELVIS WITH CONTRAST TECHNIQUE: Multidetector CT imaging of the abdomen and pelvis was performed using the standard protocol following bolus administration of intravenous contrast. RADIATION DOSE REDUCTION: This exam was performed according to the departmental dose-optimization program which includes automated exposure control, adjustment of the mA and/or kV according to patient size and/or use of iterative reconstruction technique. CONTRAST:  OMNIPAQUE  IOHEXOL  300 MG/ML  SOLN COMPARISON:  None Available. FINDINGS: Lower chest: Motion artifact limits examination. Tiny low-density pulmonary nodules are demonstrated. Perhaps the most prominent is in the right lower lung, series 5, image 14, measuring 4 mm in diameter. Linear scarring in the right base. Hepatobiliary: Scattered calcifications in the liver, likely granulomas. Gallbladder and bile ducts are normal. Pancreas: Unremarkable. No pancreatic ductal dilatation or surrounding inflammatory changes. Spleen: Multiple calcified granulomas in the spleen. Adrenals/Urinary Tract: No adrenal gland nodules. Lower pole left renal cyst measuring 4.4 cm diameter. No imaging follow-up is indicated. Nephrograms are symmetrical. No hydronephrosis or hydroureter. Bladder is normal for degree of distention. Stomach/Bowel: Stomach and small bowel are mostly decompressed. Small duodenal diverticulum. Liquid stool demonstrated throughout the colon. The right hemicolon is moderately distended. There is an annular constricting mass in the mid transverse colon likely representing colon cancer. The appendix is normal. Vascular/Lymphatic: Aortic atherosclerosis. No enlarged abdominal or pelvic lymph  nodes. Reproductive: Uterus and bilateral adnexa are unremarkable. Other: Small amount of free fluid in the upper abdomen and pelvis is probably reactive. No free air. Abdominal wall musculature appears intact. Musculoskeletal: Degenerative changes in the spine. No acute bony abnormalities. No destructive bone lesions. IMPRESSION: 1. There is an annular constricting lesion in the mid transverse colon likely representing colon cancer. There is mild proximal colonic dilatation. 2. Liquid stool in the colon likely representing infectious colitis. 3. Appendix is normal. 4. Aortic atherosclerosis. 5. Multiple pulmonary nodules, largest measuring 4 mm. In the setting of probable neoplasm, metastatic disease is not excluded although still unlikely. Oncological follow-up is recommended. Electronically Signed   By: Elsie Gravely M.D.   On: 04/08/2024 20:49   IMPRESSION: Abnormal CT imaging             -Annular constricting mass in mid transverse colon -Multiple pulmonary nodules query metastatic disease Blood per rectum Chronic diarrhea Unintentional weight loss Paroxysmal atrial fibrillation on Coumadin  Supratherapeutic INR Hypertension  PLAN: -Does not have to complete further Nulytely , will order Miralax /gatorade to complete bowel prep today -Clear liquid diet today, NPO at midnight for colonoscopy tomorrow pending INR   LOS: 1 day   Estefana Keas, [facility]

## 2024-04-10 NOTE — Progress Notes (Signed)
 Patient completed all but about 120 ml's of her prep, did have nausea and was given zofran  at 2143, had a large emesis of orange liquid (her prep), patient reported last BM on bedside commode was watery and yellow with no solid stool (this was not witnessed by this RN and prior to shift change), will record any additional stools this shift.

## 2024-04-10 NOTE — Progress Notes (Signed)
 Initial Nutrition Assessment  DOCUMENTATION CODES:   Non-severe (moderate) malnutrition in context of chronic illness  INTERVENTION:  - Clear Liquid diet per MD.  - Boost Breeze po TID, each supplement provides 250 kcal and 9 grams of protein - Encourage intake as tolerated.  - Monitor weight trends.   NUTRITION DIAGNOSIS:   Moderate Malnutrition related to chronic illness as evidenced by mild muscle depletion, percent weight loss (13% in 5.5 months).  GOAL:   Patient will meet greater than or equal to 90% of their needs  MONITOR:   PO intake, Supplement acceptance, Diet advancement, Labs, Weight trends  REASON FOR ASSESSMENT:   Malnutrition Screening Tool    ASSESSMENT:   89 y.o. female with PMH of permanent atrial fibrillation, HTN, hypothyroidism, embolic stroke who presented with complaints of abdominal pain with nausea vomiting and diarrhea for the last 3 days as well as significant weight loss. Admitted for colonic mass.   Patient reports she has been losing weight for about a year now. Per EMR, patient weighed at 167# in February and has since continued to drop to current weight of 145#. This is a 22# or 13% weight loss in 5.5 months, which is significant for the time frame.   Patient endorses eating normally despite weight loss. Typically consumes 3 small meals a day. Has yogurt with blueberries or cereal for breakfast, and either a sandwich or hot dog or [employer] for lunch and dinner.  Appetite normal recently.  Current appetite is fair. She is on clear liquids and on [employer] and reports liking it. Encouraged intake as tolerated. She is also working on bowel prep for colonoscopy.  Discussed can review ways to increase kcal/protein intake once diet advanced. Patient appreciative of care.     Medications reviewed and include: -  Labs reviewed:  K+ 3.2   NUTRITION - FOCUSED PHYSICAL EXAM:  Flowsheet Row Most Recent Value  Orbital Region Mild  depletion  Upper Arm Region No depletion  Thoracic and Lumbar Region No depletion  Buccal Region No depletion  Temple Region Mild depletion  Clavicle Bone Region Mild depletion  Clavicle and Acromion Bone Region Moderate depletion  Scapular Bone Region Unable to assess  Dorsal Hand Mild depletion  Patellar Region No depletion  Anterior Thigh Region No depletion  Posterior Calf Region No depletion  Edema (RD Assessment) None  Hair Reviewed  Eyes Reviewed  Mouth Reviewed  Skin Reviewed  Nails Reviewed    Diet Order:   Diet Order             Diet NPO time specified Except for: Sips with Meds, Ice Chips  Diet effective midnight           Diet clear liquid Fluid consistency: Thin  Diet effective now                   EDUCATION NEEDS:  Education needs have been addressed  Skin:  Skin Assessment: Reviewed RN Assessment  Last BM:  7/30 - type 7  Height:  Ht Readings from Last 1 Encounters:  04/08/24 5' 2 (1.575 m)   Weight:  Wt Readings from Last 1 Encounters:  04/08/24 65.8 kg    BMI:  Body mass index is 26.52 kg/m.  Estimated Nutritional Needs:  Kcal:  1650-1950 kcals Protein:  85-100 grams Fluid:  >/= 1.7L    Trude Ned RD, LDN Contact via Secure Chat.

## 2024-04-10 NOTE — Plan of Care (Signed)

## 2024-04-10 NOTE — Progress Notes (Signed)
 PROGRESS NOTE    Melissa Knight  FMW:[id number] DOB: [birth year] DOA: 04/08/2024 PCP: [name], FNP   Brief Narrative:  HPI: Melissa Knight is a 89 y.o. female with history of permanent atrial fibrillation, hypertension, hypothyroidism, embolic stroke presents to the ER with complaints of abdominal pain with nausea vomiting and diarrhea.  Patient has been having the symptoms for the last 3 days.  Patient had similar symptoms about a month ago which was self-limited.  Patient states she has lost at least 50 pounds in the last 1 year.  In the last 3 days she has been noticing increasing crampy abdominal pain with diarrhea which was bloody.  Denies any fever or chills.   ED Course: In the ER CT abdomen pelvis shows features concerning for colonic mass in the transverse colon concerning for colon cancer.  There is also lung nodules in the chest fields concerning for possible metastasis.  Labs show potassium of 3.1 hemoglobin 12.3 WBC 3.2 INR 4.4.  UA shows ketones small positive nitrate and esterase and many bacteria.  Patient admitted for further workup of colonic mass.  Assessment & Plan:   Principal Problem:   Colonic mass Active Problems:   Seizure disorder, generalized convulsive, intractable (HCC)   Permanent atrial fibrillation (HCC)   Hypothyroidism (acquired)   Coagulopathy (HCC)   Hypokalemia   History of embolic stroke  Colon mass: Concerning for malignancy.  [name] on board.  Patient to have colonoscopy however INR still supratherapeutic, doubt he will have today.  History of paroxysmal atrial fibrillation/coagulopathy/supratherapeutic INR: Rate is controlled.  Continue metoprolol .  INR still supratherapeutic 4.7.  Will give him 10 mg of oral vitamin K.  Repeat INR at 5 PM today, if elevated, plan to give another dose of vitamin K and repeat INR in the morning.  Acute normocytic anemia: Likely secondary to cancer.  Hemoglobin stable around 10.  No indication of  transfusion.  Monitor daily.  Essential hypertension: Controlled, continue amlodipine  and metoprolol .  Holding lisinopril  for now.  History of stroke: Continue statins.  Holding Coumadin .  Hypokalemia: Will replenish again.  Acquired hypothyroidism: Continue Synthroid .  History of seizure disorder: Continue Keppra .  UTI ruled out: Previous documentation and states that there is possible UTI and patient on empiric antibiotics however patient does not appear to be on any antibiotics.  Patient denies any urinary complaints.  No fever and no leukocytosis.  Doubt UTI.  Asymptomatic bacteriuria ruled in.  DVT prophylaxis: SCDs Start: 04/09/24 0249   Code Status: Full Code  Family Communication:  None present at bedside.  Plan of care discussed with patient in length and he/she verbalized understanding and agreed with it.  Status is: Inpatient Remains inpatient appropriate because: Needs diagnostic colonoscopy   Estimated body mass index is 26.52 kg/m as calculated from the following:   Height as of this encounter: 5' 2 (1.575 m).   Weight as of this encounter: 65.8 kg.    Nutritional Assessment: Body mass index is 26.52 kg/m.SABRA Seen by dietician.  I agree with the assessment and plan as outlined below: Nutrition Status:        . Skin Assessment: I have examined the patient's skin and I agree with the wound assessment as performed by the wound care RN as outlined below:    Consultants:  GI  Procedures:  As above  Antimicrobials:  Anti-infectives (From admission, onward)    Start     Dose/Rate Route Frequency Ordered Stop   04/09/24 0430  cefTRIAXone  (ROCEPHIN )  1 g in sodium chloride  0.9 % 100 mL IVPB  Status:  Discontinued        1 g 200 mL/hr over 30 Minutes Intravenous Every 24 hours 04/09/24 0340 04/09/24 1432         Subjective: Patient seen and examined, she has no complaints.  She says that her abdominal pain is completely  disappeared.  Objective: Vitals:   04/09/24 1424 04/09/24 1425 04/09/24 2008 04/10/24 0551  BP: (!) 110/58  139/68 (!) 146/65  Pulse: (!) 49 (!) 54 61 (!) 51  Resp: 16  17 16   Temp: 97.8 F (36.6 C)  97.7 F (36.5 C) 97.6 F (36.4 C)  TempSrc:   Oral Oral  SpO2: 100% 98% 98% 100%  Weight:      Height:        Intake/Output Summary (Last 24 hours) at 04/10/2024 0738 Last data filed at 04/10/2024 0600 Gross per 24 hour  Intake 860 ml  Output --  Net 860 ml   Filed Weights   04/08/24 1818  Weight: 65.8 kg    Examination:  General exam: Appears calm and comfortable  Respiratory system: Clear to auscultation. Respiratory effort normal. Cardiovascular system: S1 & S2 heard, RRR. No JVD, murmurs, rubs, gallops or clicks. No pedal edema. Gastrointestinal system: Abdomen is nondistended, soft and nontender. No organomegaly or masses felt. Normal bowel sounds heard. Central nervous system: Alert and oriented. No focal neurological deficits. Extremities: Symmetric 5 x 5 power. Skin: No rashes, lesions or ulcers Psychiatry: Judgement and insight appear normal. Mood & affect appropriate.    Data Reviewed: I have personally reviewed following labs and imaging studies  CBC: Recent Labs  Lab 04/08/24 1822 04/09/24 0320 04/09/24 0613 04/09/24 1057 04/10/24 0319  WBC 3.2* 3.9* 4.2 4.0 4.6  HGB 12.3 10.5* 9.8* 10.1* 10.4*  HCT 37.6 33.1* 31.0* 32.6* 33.7*  MCV 88.9 93.0 92.5 93.7 93.1  PLT 194 162 152 168 166   Basic Metabolic Panel: Recent Labs  Lab 04/08/24 1822 04/09/24 0320 04/10/24 0319  NA 137 139 137  K 3.1* 3.9 3.2*  CL 99 103 105  CO2 21* 25 23  GLUCOSE 87 83 76  BUN 16 15 7*  CREATININE 0.67 0.85 0.52  CALCIUM  9.3 8.1* 8.0*   GFR: Estimated Creatinine Clearance: 42.4 mL/min (by C-G formula based on SCr of 0.52 mg/dL). Liver Function Tests: Recent Labs  Lab 04/08/24 1822 04/09/24 0320  AST 19 14*  ALT 7 9  ALKPHOS 38 27*  BILITOT 0.6 1.0  PROT  6.4* 5.1*  ALBUMIN 3.8 2.7*   Recent Labs  Lab 04/08/24 1822  LIPASE 15   No results for input(s): AMMONIA in the last 168 hours. Coagulation Profile: Recent Labs  Lab 04/09/24 0320 04/10/24 0319  INR 4.7* 4.5*   Cardiac Enzymes: No results for input(s): CKTOTAL, CKMB, CKMBINDEX, TROPONINI in the last 168 hours. BNP (last 3 results) No results for input(s): PROBNP in the last 8760 hours. HbA1C: No results for input(s): HGBA1C in the last 72 hours. CBG: No results for input(s): GLUCAP in the last 168 hours. Lipid Profile: No results for input(s): CHOL, HDL, LDLCALC, TRIG, CHOLHDL, LDLDIRECT in the last 72 hours. Thyroid  Function Tests: No results for input(s): TSH, T4TOTAL, FREET4, T3FREE, THYROIDAB in the last 72 hours. Anemia Panel: No results for input(s): VITAMINB12, FOLATE, FERRITIN, TIBC, IRON, RETICCTPCT in the last 72 hours. Sepsis Labs: Recent Labs  Lab 04/08/24 1955  LATICACIDVEN 1.9    Recent Results (from  the past 240 hours)  C Difficile Quick Screen w PCR reflex     Status: None   Collection Time: 04/09/24  7:44 AM   Specimen: STOOL  Result Value Ref Range Status   C Diff antigen NEGATIVE NEGATIVE Final   C Diff toxin NEGATIVE NEGATIVE Final   C Diff interpretation No C. difficile detected.  Final    Comment: Performed at [facility], [address], [place], [place] [address]     Radiology Studies: CT ABDOMEN PELVIS W CONTRAST Result Date: 04/08/2024 CLINICAL DATA:  Abdominal pain, diarrhea, vomiting, bilateral lower quadrant pain. Symptoms began on Friday. EXAM: CT ABDOMEN AND PELVIS WITH CONTRAST TECHNIQUE: Multidetector CT imaging of the abdomen and pelvis was performed using the standard protocol following bolus administration of intravenous contrast. RADIATION DOSE REDUCTION: This exam was performed according to the departmental dose-optimization program which includes automated  exposure control, adjustment of the mA and/or kV according to patient size and/or use of iterative reconstruction technique. CONTRAST:  OMNIPAQUE  IOHEXOL  300 MG/ML  SOLN COMPARISON:  None Available. FINDINGS: Lower chest: Motion artifact limits examination. Tiny low-density pulmonary nodules are demonstrated. Perhaps the most prominent is in the right lower lung, series 5, image 14, measuring 4 mm in diameter. Linear scarring in the right base. Hepatobiliary: Scattered calcifications in the liver, likely granulomas. Gallbladder and bile ducts are normal. Pancreas: Unremarkable. No pancreatic ductal dilatation or surrounding inflammatory changes. Spleen: Multiple calcified granulomas in the spleen. Adrenals/Urinary Tract: No adrenal gland nodules. Lower pole left renal cyst measuring 4.4 cm diameter. No imaging follow-up is indicated. Nephrograms are symmetrical. No hydronephrosis or hydroureter. Bladder is normal for degree of distention. Stomach/Bowel: Stomach and small bowel are mostly decompressed. Small duodenal diverticulum. Liquid stool demonstrated throughout the colon. The right hemicolon is moderately distended. There is an annular constricting mass in the mid transverse colon likely representing colon cancer. The appendix is normal. Vascular/Lymphatic: Aortic atherosclerosis. No enlarged abdominal or pelvic lymph nodes. Reproductive: Uterus and bilateral adnexa are unremarkable. Other: Small amount of free fluid in the upper abdomen and pelvis is probably reactive. No free air. Abdominal wall musculature appears intact. Musculoskeletal: Degenerative changes in the spine. No acute bony abnormalities. No destructive bone lesions. IMPRESSION: 1. There is an annular constricting lesion in the mid transverse colon likely representing colon cancer. There is mild proximal colonic dilatation. 2. Liquid stool in the colon likely representing infectious colitis. 3. Appendix is normal. 4. Aortic  atherosclerosis. 5. Multiple pulmonary nodules, largest measuring 4 mm. In the setting of probable neoplasm, metastatic disease is not excluded although still unlikely. Oncological follow-up is recommended. Electronically Signed   By: Elsie Gravely M.D.   On: 04/08/2024 20:49    Scheduled Meds:  amLODipine   2.5 mg Oral Daily   atorvastatin   10 mg Oral q1800   feeding supplement  1 Container Oral TID BM   levETIRAcetam   500 mg Oral Daily   levothyroxine   88 mcg Oral Q0600   metoprolol  tartrate  100 mg Oral Daily   And   metoprolol  tartrate  50 mg Oral QHS   phytonadione   10 mg Oral Once   potassium chloride   40 mEq Oral Q4H   Continuous Infusions:  sodium chloride  Stopped (04/09/24 1540)     LOS: 1 day   Fredia Skeeter, MD [facility]  04/10/2024, 7:38 AM   *Please note that this is a verbal dictation therefore any spelling or grammatical errors are due to the Dragon Medical One  system interpretation.  Please page via Amion and do not message via secure chat for urgent patient care matters. Secure chat can be used for non urgent patient care matters.  How to contact the [facility] Attending or Consulting provider 7A - 7P or covering provider during after hours 7P -7A, for this patient?  Check the care team in [facility] and look for a) attending/consulting [facility] provider listed and b) the [facility] team listed. Page or secure chat 7A-7P. Log into [email or web address] and use [facility] universal password to access. If you do not have the password, please contact the hospital operator. Locate the [facility] provider you are looking for under [facility] and page to a number that you can be directly reached. If you still have difficulty reaching the provider, please page the [facility] (Director on Call) for the Hospitalists listed on amion for assistance.

## 2024-04-10 NOTE — Progress Notes (Signed)
 Called lab to draw INR.

## 2024-04-10 NOTE — Progress Notes (Signed)
   04/10/24 1324  TOC Brief Assessment  Insurance and Status Reviewed  Patient has primary care physician Yes  Home environment has been reviewed home with spouse  Prior level of function: modified independent  Prior/Current Home Services No current home services  Social Drivers of Health Review SDOH reviewed no interventions necessary  Readmission risk has been reviewed Yes  Transition of care needs no transition of care needs at this time

## 2024-04-11 DIAGNOSIS — K6389 Other specified diseases of intestine: Secondary | ICD-10-CM | POA: Diagnosis not present

## 2024-04-11 DIAGNOSIS — E440 Moderate protein-calorie malnutrition: Secondary | ICD-10-CM | POA: Insufficient documentation

## 2024-04-11 LAB — BASIC METABOLIC PANEL WITH GFR
Anion gap: 11 (ref 5–15)
BUN: <5 mg/dL — ABNORMAL LOW (ref 8–23)
CO2: 22 mmol/L (ref 22–32)
Calcium: 8.6 mg/dL — ABNORMAL LOW (ref 8.9–10.3)
Chloride: 104 mmol/L (ref 98–111)
Creatinine, Ser: 0.55 mg/dL (ref 0.44–1.00)
GFR, Estimated: >60 mL/min (ref >60)
Glucose, Bld: 149 mg/dL — ABNORMAL HIGH (ref 70–99)
Potassium: 4.1 mmol/L (ref 3.5–5.1)
Sodium: 137 mmol/L (ref 135–145)

## 2024-04-11 LAB — CBC
HCT: 37.7 % (ref 36.0–46.0)
Hemoglobin: 11.8 g/dL — ABNORMAL LOW (ref 12.0–15.0)
MCH: 29.2 pg (ref 26.0–34.0)
MCHC: 31.3 g/dL (ref 30.0–36.0)
MCV: 93.3 fL (ref 80.0–100.0)
Platelets: 173 K/uL (ref 150–400)
RBC: 4.04 MIL/uL (ref 3.87–5.11)
RDW: 13.7 % (ref 11.5–15.5)
WBC: 8.1 K/uL (ref 4.0–10.5)
nRBC: 0 % (ref 0.0–0.2)

## 2024-04-11 LAB — PROTIME-INR
INR: 3.1 — ABNORMAL HIGH (ref 0.8–1.2)
Prothrombin Time: 33.5 s — ABNORMAL HIGH (ref 11.4–15.2)

## 2024-04-11 MED ORDER — PROCHLORPERAZINE EDISYLATE 10 MG/2ML IJ SOLN
5.0000 mg | Freq: Four times a day (QID) | INTRAMUSCULAR | Status: AC | PRN
  Administered 2024-04-11 – 2024-04-17 (×2): 5 mg via INTRAVENOUS
  Filled 2024-04-11 (×2): qty 2

## 2024-04-11 MED ORDER — PHYTONADIONE 5 MG PO TABS
10.0000 mg | ORAL_TABLET | Freq: Once | ORAL | Status: AC
  Administered 2024-04-11: 10 mg via ORAL
  Filled 2024-04-11: qty 2

## 2024-04-11 NOTE — Plan of Care (Signed)
  Problem: Activity: Goal: Risk for activity intolerance will decrease Outcome: Progressing   Problem: Safety: Goal: Ability to remain free from injury will improve Outcome: Progressing   Problem: Pain Managment: Goal: General experience of comfort will improve and/or be controlled Outcome: Progressing

## 2024-04-11 NOTE — Progress Notes (Signed)
 PROGRESS NOTE    Melissa Knight  FMW:[id number] DOB: 1933-04-27 DOA: 04/08/2024 PCP: Melissa Mustard, FNP   Brief Narrative:  HPI: Melissa Knight is a 89 y.o. female with history of permanent atrial fibrillation, hypertension, hypothyroidism, embolic stroke presents to the ER with complaints of abdominal pain with nausea vomiting and diarrhea.  Patient has been having the symptoms for the last 3 days.  Patient had similar symptoms about a month ago which was self-limited.  Patient states she has lost at least 50 pounds in the last 1 year.  In the last 3 days she has been noticing increasing crampy abdominal pain with diarrhea which was bloody.  Denies any fever or chills.   ED Course: In the ER CT abdomen pelvis shows features concerning for colonic mass in the transverse colon concerning for colon cancer.  There is also lung nodules in the chest fields concerning for possible metastasis.  Labs show potassium of 3.1 hemoglobin 12.3 WBC 3.2 INR 4.4.  UA shows ketones small positive nitrate and esterase and many bacteria.  Patient admitted for further workup of colonic mass.  Assessment & Plan:   Principal Problem:   Colonic mass Active Problems:   Seizure disorder, generalized convulsive, intractable (HCC)   Permanent atrial fibrillation (HCC)   Hypothyroidism (acquired)   Coagulopathy (HCC)   Hypokalemia   History of embolic stroke   Malnutrition of moderate degree  Colon mass: Concerning for malignancy.  Melissa Knight on board.  Patient to have colonoscopy however INR still supratherapeutic.  Patient received 15 mg of vitamin K yesterday.  Per Knight recommendation, will give her another 10 mg of vitamin K today.  History of paroxysmal atrial fibrillation/coagulopathy/supratherapeutic INR: Rate is controlled.  Continue metoprolol .  INR was 4.5 yesterday, received 10 mg of vitamin K in the morning and then 5 mg in the evening and INR still 3.1.  Acute normocytic anemia: Likely secondary to  cancer.  Hemoglobin improved to 11.8 today.  No indication of transfusion.  Monitor daily.  Essential hypertension: Controlled, continue amlodipine  and metoprolol .  Holding lisinopril  for now.  History of stroke: Continue statins.  Holding Coumadin .  Hypokalemia: Will replenish again.  Acquired hypothyroidism: Continue Synthroid .  History of seizure disorder: Continue Keppra .  UTI ruled out: Previous documentation and states that there is possible UTI and patient on empiric antibiotics however patient does not appear to be on any antibiotics.  Patient denies any urinary complaints.  No fever and no leukocytosis.  Doubt UTI.  Asymptomatic bacteriuria ruled in.  DVT prophylaxis: SCDs Start: 04/09/24 0249   Code Status: Full Code  Family Communication:  None present at bedside.  Plan of care discussed with patient in length and he/she verbalized understanding and agreed with it.  Status is: Inpatient Remains inpatient appropriate because: Needs diagnostic colonoscopy   Estimated body mass index is 26.52 kg/m as calculated from the following:   Height as of this encounter: 5' 2 (1.575 m).   Weight as of this encounter: 65.8 kg.    Nutritional Assessment: Body mass index is 26.52 kg/m.SABRA Seen by dietician.  I agree with the assessment and plan as outlined below: Nutrition Status: Nutrition Problem: Moderate Malnutrition Etiology: chronic illness Signs/Symptoms: mild muscle depletion, percent weight loss (13% in 5.5 months) Percent weight loss: 13 % (in 5.5 months) Interventions: Refer to RD note for recommendations, Boost Breeze  . Skin Assessment: I have examined the patient's skin and I agree with the wound assessment as performed by the wound care RN  as outlined below:    Consultants:  Knight  Procedures:  As above  Antimicrobials:  Anti-infectives (From admission, onward)    Start     Dose/Rate Route Frequency Ordered Stop   04/09/24 0430  cefTRIAXone  (ROCEPHIN ) 1 g  in sodium chloride  0.9 % 100 mL IVPB  Status:  Discontinued        1 g 200 mL/hr over 30 Minutes Intravenous Every 24 hours 04/09/24 0340 04/09/24 1432         Subjective: Patient seen and examined, nurse at the bedside.  Patient has no complaints at all.  Objective: Vitals:   04/10/24 1409 04/10/24 2054 04/10/24 2143 04/11/24 0634  BP: 119/60 (!) 143/70  (!) 141/81  Pulse: (!) 54 86 88 65  Resp: 18 20  18   Temp: 98.4 F (36.9 C) 98.9 F (37.2 C)  97.6 F (36.4 C)  TempSrc:      SpO2: 98% 99%  97%  Weight:      Height:        Intake/Output Summary (Last 24 hours) at 04/11/2024 0840 Last data filed at 04/11/2024 0046 Gross per 24 hour  Intake 900 ml  Output 1000 ml  Net -100 ml   Filed Weights   04/08/24 1818  Weight: 65.8 kg    Examination:  General exam: Appears calm and comfortable  Respiratory system: Clear to auscultation. Respiratory effort normal. Cardiovascular system: S1 & S2 heard, RRR. No JVD, murmurs, rubs, gallops or clicks. No pedal edema. Gastrointestinal system: Abdomen is nondistended, soft and nontender. No organomegaly or masses felt. Normal bowel sounds heard. Central nervous system: Alert and oriented. No focal neurological deficits. Extremities: Symmetric 5 x 5 power. Skin: No rashes, lesions or ulcers.  Psychiatry: Judgement and insight appear normal. Mood & affect appropriate.   Data Reviewed: I have personally reviewed following labs and imaging studies  CBC: Recent Labs  Lab 04/09/24 0320 04/09/24 0613 04/09/24 1057 04/10/24 0319 04/11/24 0317  WBC 3.9* 4.2 4.0 4.6 8.1  HGB 10.5* 9.8* 10.1* 10.4* 11.8*  HCT 33.1* 31.0* 32.6* 33.7* 37.7  MCV 93.0 92.5 93.7 93.1 93.3  PLT 162 152 168 166 173   Basic Metabolic Panel: Recent Labs  Lab 04/08/24 1822 04/09/24 0320 04/10/24 0319 04/11/24 0317  NA 137 139 137 137  K 3.1* 3.9 3.2* 4.1  CL 99 103 105 104  CO2 21* 25 23 22   GLUCOSE 87 83 76 149*  BUN 16 15 7* <5*  CREATININE  0.67 0.85 0.52 0.55  CALCIUM  9.3 8.1* 8.0* 8.6*   GFR: Estimated Creatinine Clearance: 42.4 mL/min (by C-G formula based on SCr of 0.55 mg/dL). Liver Function Tests: Recent Labs  Lab 04/08/24 1822 04/09/24 0320  AST 19 14*  ALT 7 9  ALKPHOS 38 27*  BILITOT 0.6 1.0  PROT 6.4* 5.1*  ALBUMIN 3.8 2.7*   Recent Labs  Lab 04/08/24 1822  LIPASE 15   No results for input(s): AMMONIA in the last 168 hours. Coagulation Profile: Recent Labs  Lab 04/09/24 0320 04/10/24 0319 04/10/24 1927 04/11/24 0317  INR 4.7* 4.5* 3.4* 3.1*   Cardiac Enzymes: No results for input(s): CKTOTAL, CKMB, CKMBINDEX, TROPONINI in the last 168 hours. BNP (last 3 results) No results for input(s): PROBNP in the last 8760 hours. HbA1C: No results for input(s): HGBA1C in the last 72 hours. CBG: No results for input(s): GLUCAP in the last 168 hours. Lipid Profile: No results for input(s): CHOL, HDL, LDLCALC, TRIG, CHOLHDL, LDLDIRECT in the last  72 hours. Thyroid  Function Tests: No results for input(s): TSH, T4TOTAL, FREET4, T3FREE, THYROIDAB in the last 72 hours. Anemia Panel: No results for input(s): VITAMINB12, FOLATE, FERRITIN, TIBC, IRON, RETICCTPCT in the last 72 hours. Sepsis Labs: Recent Labs  Lab 04/08/24 1955  LATICACIDVEN 1.9    Recent Results (from the past 240 hours)  C Difficile Quick Screen w PCR reflex     Status: None   Collection Time: 04/09/24  7:44 AM   Specimen: STOOL  Result Value Ref Range Status   C Diff antigen NEGATIVE NEGATIVE Final   C Diff toxin NEGATIVE NEGATIVE Final   C Diff interpretation No C. difficile detected.  Final    Comment: Performed at [facility], [address] [place], [place] [address]  Gastrointestinal Panel by PCR , Stool     Status: None   Collection Time: 04/09/24  7:44 AM   Specimen: Stool  Result Value Ref Range Status   Campylobacter species NOT DETECTED NOT DETECTED  Final   Plesimonas shigelloides NOT DETECTED NOT DETECTED Final   Salmonella species NOT DETECTED NOT DETECTED Final   Yersinia enterocolitica NOT DETECTED NOT DETECTED Final   Vibrio species NOT DETECTED NOT DETECTED Final   Vibrio cholerae NOT DETECTED NOT DETECTED Final   Enteroaggregative E coli (EAEC) NOT DETECTED NOT DETECTED Final   Enteropathogenic E coli (EPEC) NOT DETECTED NOT DETECTED Final   Enterotoxigenic E coli (ETEC) NOT DETECTED NOT DETECTED Final   Shiga like toxin producing E coli (STEC) NOT DETECTED NOT DETECTED Final   Shigella/Enteroinvasive E coli (EIEC) NOT DETECTED NOT DETECTED Final   Cryptosporidium NOT DETECTED NOT DETECTED Final   Cyclospora cayetanensis NOT DETECTED NOT DETECTED Final   Entamoeba histolytica NOT DETECTED NOT DETECTED Final   Giardia lamblia NOT DETECTED NOT DETECTED Final   Adenovirus F40/41 NOT DETECTED NOT DETECTED Final   Astrovirus NOT DETECTED NOT DETECTED Final   Norovirus Knight/GII NOT DETECTED NOT DETECTED Final   Rotavirus A NOT DETECTED NOT DETECTED Final   Sapovirus (I, II, IV, and V) NOT DETECTED NOT DETECTED Final    Comment: Performed at [facility], [address], [place], [place] [address]     Radiology Studies: No results found.   Scheduled Meds:  amLODipine   2.5 mg Oral Daily   atorvastatin   10 mg Oral q1800   feeding supplement  1 Container Oral TID BM   levETIRAcetam   500 mg Oral Daily   levothyroxine   88 mcg Oral Q0600   metoprolol  tartrate  100 mg Oral Daily   And   metoprolol  tartrate  50 mg Oral QHS   Continuous Infusions:     LOS: 2 days   Fredia Skeeter, MD [facility]  04/11/2024, 8:40 AM   *Please note that this is a verbal dictation therefore any spelling or grammatical errors are due to the Dragon Medical One system interpretation.  Please page via Amion and do not message via secure chat for urgent patient care matters. Secure chat can be used for non urgent patient care  matters.  How to contact the [facility] Attending or Consulting provider 7A - 7P or covering provider during after hours 7P -7A, for this patient?  Check the care team in [facility] and look for a) attending/consulting TRH provider listed and b) the TR[facility] team listed. Page or secure chat 7A-7P. Log into [email or web address] and use [facility] universal password to access. If you do not have the password, please contact the hospital operator.  Locate the [facility] provider you are looking for under [facility] and page to a number that you can be directly reached. If you still have difficulty reaching the provider, please page the [facility] (Director on Call) for the Hospitalists listed on amion for assistance.

## 2024-04-11 NOTE — Progress Notes (Signed)
 Tentative plan was for colonoscopy today, though given supra-therapeutic INR, procedure was deferred to 04/12/24. Vitamin K per IM team, appreciate. She has received 1/2 dose of Nulytely  (given patient intolerance of taste), Miralax /gatorade bowel prep last evening. Having yellow liquid stools per nursing. Will maintain clear liquid diet for now with no further bowel prep. Aim for colonoscopy 04/12/24 if INR appropriate. NPO at midnight for procedure.   Melissa Keas, DO [facility]

## 2024-04-12 DIAGNOSIS — K6389 Other specified diseases of intestine: Secondary | ICD-10-CM | POA: Diagnosis not present

## 2024-04-12 LAB — BASIC METABOLIC PANEL WITH GFR
Anion gap: 6 (ref 5–15)
BUN: <5 mg/dL — ABNORMAL LOW (ref 8–23)
CO2: 23 mmol/L (ref 22–32)
Calcium: 8.2 mg/dL — ABNORMAL LOW (ref 8.9–10.3)
Chloride: 106 mmol/L (ref 98–111)
Creatinine, Ser: 0.86 mg/dL (ref 0.44–1.00)
GFR, Estimated: >60 mL/min (ref >60)
Glucose, Bld: 85 mg/dL (ref 70–99)
Potassium: 3.5 mmol/L (ref 3.5–5.1)
Sodium: 135 mmol/L (ref 135–145)

## 2024-04-12 LAB — CBC
HCT: 32.2 % — ABNORMAL LOW (ref 36.0–46.0)
Hemoglobin: 9.8 g/dL — ABNORMAL LOW (ref 12.0–15.0)
MCH: 28.3 pg (ref 26.0–34.0)
MCHC: 30.4 g/dL (ref 30.0–36.0)
MCV: 93.1 fL (ref 80.0–100.0)
Platelets: 172 K/uL (ref 150–400)
RBC: 3.46 MIL/uL — ABNORMAL LOW (ref 3.87–5.11)
RDW: 13.7 % (ref 11.5–15.5)
WBC: 5.1 K/uL (ref 4.0–10.5)
nRBC: 0 % (ref 0.0–0.2)

## 2024-04-12 LAB — PROTIME-INR
INR: 2.2 — ABNORMAL HIGH (ref 0.8–1.2)
INR: 1.6 — ABNORMAL HIGH (ref 0.8–1.2)
Prothrombin Time: 25.2 s — ABNORMAL HIGH (ref 11.4–15.2)
Prothrombin Time: 20 s — ABNORMAL HIGH (ref 11.4–15.2)

## 2024-04-12 MED ORDER — POLYETHYLENE GLYCOL 3350 17 GM/SCOOP PO POWD
238.0000 g | Freq: Once | ORAL | Status: AC
  Administered 2024-04-12: 238 g via ORAL
  Filled 2024-04-12: qty 238

## 2024-04-12 NOTE — H&P (View-Only) (Signed)
 INR remains elevated this AM. Defer colonoscopy to tomorrow with Dr. Elicia pending INR.  Ok for clear liquid diet today, NPO at midnight tonight.   Melissa Keas, DO [facility]

## 2024-04-12 NOTE — Plan of Care (Signed)

## 2024-04-12 NOTE — Progress Notes (Signed)
 Informed consent signed by the pt for the colonoscopy. Consent stored in pt's chart.

## 2024-04-12 NOTE — Progress Notes (Signed)
 PROGRESS NOTE    Melissa Knight  FMW:[id number] DOB: 1933-07-26 DOA: 04/08/2024 PCP: [name], FNP   Brief Narrative:  HPI: Melissa Knight is a 89 y.o. female with history of permanent atrial fibrillation, hypertension, hypothyroidism, embolic stroke presents to the ER with complaints of abdominal pain with nausea vomiting and diarrhea.  Patient has been having the symptoms for the last 3 days.  Patient had similar symptoms about a month ago which was self-limited.  Patient states she has lost at least 50 pounds in the last 1 year.  In the last 3 days she has been noticing increasing crampy abdominal pain with diarrhea which was bloody.  Denies any fever or chills.   ED Course: In the ER CT abdomen pelvis shows features concerning for colonic mass in the transverse colon concerning for colon cancer.  There is also lung nodules in the chest fields concerning for possible metastasis.  Labs show potassium of 3.1 hemoglobin 12.3 WBC 3.2 INR 4.4.  UA shows ketones small positive nitrate and esterase and many bacteria.  Patient admitted for further workup of colonic mass.  Assessment & Plan:   Principal Problem:   Colonic mass Active Problems:   Seizure disorder, generalized convulsive, intractable (HCC)   Permanent atrial fibrillation (HCC)   Hypothyroidism (acquired)   Coagulopathy (HCC)   Hypokalemia   History of embolic stroke   Malnutrition of moderate degree  Colon mass: Concerning for malignancy.  [name] on board.  Patient to have colonoscopy however INR still supratherapeutic.  Patient had received multiple doses of vitamin K, INR still 2.2.  Colonoscopy postponed till tomorrow by GI once again.  History of paroxysmal atrial fibrillation/coagulopathy/supratherapeutic INR: Rate is controlled.  Continue metoprolol .  INR still therapeutic despite of multiple doses of vitamin K.  Half-life of vitamin K is 48 hours.  She has received enough vitamin K so we will not give her any  more.  Recheck INR later today and if elevated, more vitamin K to be given.  Acute normocytic anemia: Likely secondary to cancer.  Slight drop in hemoglobin down to 9.8, no indication of transfusion.  Essential hypertension: Controlled, continue amlodipine  and metoprolol .  Holding lisinopril  for now.  History of stroke: Continue statins.  Holding Coumadin .  Hypokalemia: Will replenish again.  Acquired hypothyroidism: Continue Synthroid .  History of seizure disorder: Continue Keppra .  UTI ruled out: Previous documentation and states that there is possible UTI and patient on empiric antibiotics however patient does not appear to be on any antibiotics.  Patient denies any urinary complaints.  No fever and no leukocytosis.  Doubt UTI.  Asymptomatic bacteriuria ruled in.  DVT prophylaxis: SCDs Start: 04/09/24 0249   Code Status: Full Code  Family Communication:  None present at bedside.  Plan of care discussed with patient in length and he/she verbalized understanding and agreed with it.  Status is: Inpatient Remains inpatient appropriate because: Needs diagnostic colonoscopy   Estimated body mass index is 26.52 kg/m as calculated from the following:   Height as of this encounter: 5' 2 (1.575 m).   Weight as of this encounter: 65.8 kg.    Nutritional Assessment: Body mass index is 26.52 kg/m.Melissa Knight Seen by dietician.  I agree with the assessment and plan as outlined below: Nutrition Status: Nutrition Problem: Moderate Malnutrition Etiology: chronic illness Signs/Symptoms: mild muscle depletion, percent weight loss (13% in 5.5 months) Percent weight loss: 13 % (in 5.5 months) Interventions: Refer to RD note for recommendations, Boost Breeze  . Skin Assessment: I  have examined the patient's skin and I agree with the wound assessment as performed by the wound care RN as outlined below:    Consultants:  GI  Procedures:  As above  Antimicrobials:  Anti-infectives (From  admission, onward)    Start     Dose/Rate Route Frequency Ordered Stop   04/09/24 0430  cefTRIAXone  (ROCEPHIN ) 1 g in sodium chloride  0.9 % 100 mL IVPB  Status:  Discontinued        1 g 200 mL/hr over 30 Minutes Intravenous Every 24 hours 04/09/24 0340 04/09/24 1432         Subjective: Seen and examined, she has no complaints.  Objective: Vitals:   04/11/24 1340 04/11/24 1943 04/12/24 0553 04/12/24 0816  BP: 109/62 109/72 126/68 127/80  Pulse: (!) 51 65 (!) 58 65  Resp: 16 18 18 16   Temp: 98.1 F (36.7 C) 98.9 F (37.2 C) 98.2 F (36.8 C) 98.2 F (36.8 C)  TempSrc:   Oral Oral  SpO2: 98% 97% 95% 96%  Weight:      Height:        Intake/Output Summary (Last 24 hours) at 04/12/2024 0845 Last data filed at 04/12/2024 9192 Gross per 24 hour  Intake 240 ml  Output --  Net 240 ml   Filed Weights   04/08/24 1818  Weight: 65.8 kg    Examination:  General exam: Appears calm and comfortable  Respiratory system: Clear to auscultation. Respiratory effort normal. Cardiovascular system: S1 & S2 heard, RRR. No JVD, murmurs, rubs, gallops or clicks. No pedal edema. Gastrointestinal system: Abdomen is nondistended, soft and nontender. No organomegaly or masses felt. Normal bowel sounds heard. Central nervous system: Alert and oriented. No focal neurological deficits. Extremities: Symmetric 5 x 5 power. Skin: No rashes, lesions or ulcers.  Psychiatry: Judgement and insight appear normal. Mood & affect appropriate.   Data Reviewed: I have personally reviewed following labs and imaging studies  CBC: Recent Labs  Lab 04/09/24 0613 04/09/24 1057 04/10/24 0319 04/11/24 0317 04/12/24 0315  WBC 4.2 4.0 4.6 8.1 5.1  HGB 9.8* 10.1* 10.4* 11.8* 9.8*  HCT 31.0* 32.6* 33.7* 37.7 32.2*  MCV 92.5 93.7 93.1 93.3 93.1  PLT 152 168 166 173 172   Basic Metabolic Panel: Recent Labs  Lab 04/08/24 1822 04/09/24 0320 04/10/24 0319 04/11/24 0317 04/12/24 0315  NA 137 139 137 137 135   K 3.1* 3.9 3.2* 4.1 3.5  CL 99 103 105 104 106  CO2 21* 25 23 22 23   GLUCOSE 87 83 76 149* 85  BUN 16 15 7* <5* <5*  CREATININE 0.67 0.85 0.52 0.55 0.86  CALCIUM  9.3 8.1* 8.0* 8.6* 8.2*   GFR: Estimated Creatinine Clearance: 39.5 mL/min (by C-G formula based on SCr of 0.86 mg/dL). Liver Function Tests: Recent Labs  Lab 04/08/24 1822 04/09/24 0320  AST 19 14*  ALT 7 9  ALKPHOS 38 27*  BILITOT 0.6 1.0  PROT 6.4* 5.1*  ALBUMIN 3.8 2.7*   Recent Labs  Lab 04/08/24 1822  LIPASE 15   No results for input(s): AMMONIA in the last 168 hours. Coagulation Profile: Recent Labs  Lab 04/09/24 0320 04/10/24 0319 04/10/24 1927 04/11/24 0317 04/12/24 0315  INR 4.7* 4.5* 3.4* 3.1* 2.2*   Cardiac Enzymes: No results for input(s): CKTOTAL, CKMB, CKMBINDEX, TROPONINI in the last 168 hours. BNP (last 3 results) No results for input(s): PROBNP in the last 8760 hours. HbA1C: No results for input(s): HGBA1C in the last 72 hours. CBG:  No results for input(s): GLUCAP in the last 168 hours. Lipid Profile: No results for input(s): CHOL, HDL, LDLCALC, TRIG, CHOLHDL, LDLDIRECT in the last 72 hours. Thyroid  Function Tests: No results for input(s): TSH, T4TOTAL, FREET4, T3FREE, THYROIDAB in the last 72 hours. Anemia Panel: No results for input(s): VITAMINB12, FOLATE, FERRITIN, TIBC, IRON, RETICCTPCT in the last 72 hours. Sepsis Labs: Recent Labs  Lab 04/08/24 1955  LATICACIDVEN 1.9    Recent Results (from the past 240 hours)  C Difficile Quick Screen w PCR reflex     Status: None   Collection Time: 04/09/24  7:44 AM   Specimen: STOOL  Result Value Ref Range Status   C Diff antigen NEGATIVE NEGATIVE Final   C Diff toxin NEGATIVE NEGATIVE Final   C Diff interpretation No C. difficile detected.  Final    Comment: Performed at [facility], [address] [place], [place] [address]  Gastrointestinal Panel by PCR ,  Stool     Status: None   Collection Time: 04/09/24  7:44 AM   Specimen: Stool  Result Value Ref Range Status   Campylobacter species NOT DETECTED NOT DETECTED Final   Plesimonas shigelloides NOT DETECTED NOT DETECTED Final   Salmonella species NOT DETECTED NOT DETECTED Final   Yersinia enterocolitica NOT DETECTED NOT DETECTED Final   Vibrio species NOT DETECTED NOT DETECTED Final   Vibrio cholerae NOT DETECTED NOT DETECTED Final   Enteroaggregative E coli (EAEC) NOT DETECTED NOT DETECTED Final   Enteropathogenic E coli (EPEC) NOT DETECTED NOT DETECTED Final   Enterotoxigenic E coli (ETEC) NOT DETECTED NOT DETECTED Final   Shiga like toxin producing E coli (STEC) NOT DETECTED NOT DETECTED Final   Shigella/Enteroinvasive E coli (EIEC) NOT DETECTED NOT DETECTED Final   Cryptosporidium NOT DETECTED NOT DETECTED Final   Cyclospora cayetanensis NOT DETECTED NOT DETECTED Final   Entamoeba histolytica NOT DETECTED NOT DETECTED Final   Giardia lamblia NOT DETECTED NOT DETECTED Final   Adenovirus F40/41 NOT DETECTED NOT DETECTED Final   Astrovirus NOT DETECTED NOT DETECTED Final   Norovirus GI/GII NOT DETECTED NOT DETECTED Final   Rotavirus A NOT DETECTED NOT DETECTED Final   Sapovirus (I, II, IV, and V) NOT DETECTED NOT DETECTED Final    Comment: Performed at [facility] [address] [place], [place] [address]     Radiology Studies: No results found.   Scheduled Meds:  amLODipine   2.5 mg Oral Daily   atorvastatin   10 mg Oral q1800   feeding supplement  1 Container Oral TID BM   levETIRAcetam   500 mg Oral Daily   levothyroxine   88 mcg Oral Q0600   metoprolol  tartrate  100 mg Oral Daily   And   metoprolol  tartrate  50 mg Oral QHS   Continuous Infusions:     LOS: 3 days   Fredia Skeeter, MD [facility]  04/12/2024, 8:45 AM   *Please note that this is a verbal dictation therefore any spelling or grammatical errors are due to the Dragon Medical One system  interpretation.  Please page via Amion and do not message via secure chat for urgent patient care matters. Secure chat can be used for non urgent patient care matters.  How to contact the [facility] Attending or Consulting provider 7A - 7P or covering provider during after hours 7P -7A, for this patient?  Check the care team in [facility] and look for a) attending/consulting TR[facility] provider listed and b) the [facility] team listed. Page or secure  chat 7A-7P. Log into [email or web address] and use [facility] universal password to access. If you do not have the password, please contact the hospital operator. Locate the [facility] provider you are looking for under [facility] and page to a number that you can be directly reached. If you still have difficulty reaching the provider, please page the [facility] (Director on Call) for the Hospitalists listed on amion for assistance.

## 2024-04-12 NOTE — Plan of Care (Signed)
  Problem: Coping: Goal: Level of anxiety will decrease Outcome: Progressing   Problem: Elimination: Goal: Will not experience complications related to bowel motility Outcome: Progressing Goal: Will not experience complications related to urinary retention Outcome: Progressing   Problem: Safety: Goal: Ability to remain free from injury will improve Outcome: Progressing   

## 2024-04-12 NOTE — Progress Notes (Signed)
 INR remains elevated this AM. Defer colonoscopy to tomorrow with Dr. Elicia pending INR.  Ok for clear liquid diet today, NPO at midnight tonight.   Estefana Keas, DO [facility]

## 2024-04-12 NOTE — Plan of Care (Signed)

## 2024-04-13 ENCOUNTER — Inpatient Hospital Stay (HOSPITAL_COMMUNITY): Payer: Self-pay

## 2024-04-13 ENCOUNTER — Encounter (HOSPITAL_COMMUNITY)
Admission: EM | Disposition: A | Discharge status: Home Health | Payer: Self-pay | Source: Home / Self Care | Attending: Internal Medicine

## 2024-04-13 ENCOUNTER — Encounter (HOSPITAL_COMMUNITY): Payer: Self-pay | Admitting: Internal Medicine

## 2024-04-13 DIAGNOSIS — I679 Cerebrovascular disease, unspecified: Secondary | ICD-10-CM | POA: Diagnosis not present

## 2024-04-13 DIAGNOSIS — D125 Benign neoplasm of sigmoid colon: Secondary | ICD-10-CM

## 2024-04-13 DIAGNOSIS — I4891 Unspecified atrial fibrillation: Secondary | ICD-10-CM

## 2024-04-13 DIAGNOSIS — K625 Hemorrhage of anus and rectum: Secondary | ICD-10-CM | POA: Diagnosis not present

## 2024-04-13 DIAGNOSIS — K5730 Diverticulosis of large intestine without perforation or abscess without bleeding: Secondary | ICD-10-CM

## 2024-04-13 DIAGNOSIS — K6389 Other specified diseases of intestine: Secondary | ICD-10-CM | POA: Diagnosis not present

## 2024-04-13 HISTORY — PX: COLONOSCOPY: SHX5424

## 2024-04-13 LAB — PROTIME-INR
INR: 1.6 — ABNORMAL HIGH (ref 0.8–1.2)
Prothrombin Time: 19.4 s — ABNORMAL HIGH (ref 11.4–15.2)

## 2024-04-13 LAB — BASIC METABOLIC PANEL WITH GFR
Anion gap: 9 (ref 5–15)
BUN: <5 mg/dL — ABNORMAL LOW (ref 8–23)
CO2: 20 mmol/L — ABNORMAL LOW (ref 22–32)
Calcium: 8.6 mg/dL — ABNORMAL LOW (ref 8.9–10.3)
Chloride: 108 mmol/L (ref 98–111)
Creatinine, Ser: 0.84 mg/dL (ref 0.44–1.00)
GFR, Estimated: >60 mL/min (ref >60)
Glucose, Bld: 99 mg/dL (ref 70–99)
Potassium: 3.9 mmol/L (ref 3.5–5.1)
Sodium: 137 mmol/L (ref 135–145)

## 2024-04-13 SURGERY — COLONOSCOPY
Anesthesia: Monitor Anesthesia Care

## 2024-04-13 MED ORDER — PHENYLEPHRINE 80 MCG/ML (10ML) SYRINGE FOR IV PUSH (FOR BLOOD PRESSURE SUPPORT)
PREFILLED_SYRINGE | INTRAVENOUS | Status: DC | PRN
  Administered 2024-04-13 (×2): 160 ug via INTRAVENOUS

## 2024-04-13 MED ORDER — PROPOFOL 500 MG/50ML IV EMUL
INTRAVENOUS | Status: DC | PRN
  Administered 2024-04-13: 125 ug/kg/min via INTRAVENOUS

## 2024-04-13 MED ORDER — SODIUM CHLORIDE 0.9 % IV SOLN: INTRAVENOUS | Status: DC | PRN

## 2024-04-13 MED ORDER — SPOT INK MARKER SYRINGE KIT
PACK | SUBMUCOSAL | Status: AC
  Filled 2024-04-13: qty 5

## 2024-04-13 MED ORDER — PROPOFOL 10 MG/ML IV BOLUS
INTRAVENOUS | Status: DC | PRN
  Administered 2024-04-13 (×2): 20 mg via INTRAVENOUS

## 2024-04-13 MED ORDER — SPOT INK MARKER SYRINGE KIT
PACK | SUBMUCOSAL | Status: DC | PRN
  Administered 2024-04-13: 1 mL via SUBMUCOSAL

## 2024-04-13 NOTE — Transfer of Care (Signed)
 Immediate Anesthesia Transfer of Care Note  Patient: Melissa Knight  Procedure(s) Performed: COLONOSCOPY  Patient Location: PACU  Anesthesia Type:General  Level of Consciousness: drowsy and patient cooperative  Airway & Oxygen Therapy: Patient Spontanous Breathing and Patient connected to face mask oxygen  Post-op Assessment: Report given to RN and Post -op Vital signs reviewed and stable  Post vital signs: Reviewed and stable  Last Vitals:  Vitals Value Taken Time  BP 103/58 04/13/24 11:15  Temp 36.1 C 04/13/24 11:11  Pulse 74 04/13/24 11:16  Resp 22 04/13/24 11:16  SpO2 99 % 04/13/24 11:16  Vitals shown include unfiled device data.  Last Pain:  Vitals:   04/13/24 1111  TempSrc:   PainSc: Asleep         Complications: No notable events documented.

## 2024-04-13 NOTE — Anesthesia Postprocedure Evaluation (Signed)
 Anesthesia Post Note  Patient: Melissa Knight  Procedure(s) Performed: COLONOSCOPY     Patient location during evaluation: PACU Anesthesia Type: MAC Level of consciousness: awake and alert Pain management: pain level controlled Vital Signs Assessment: post-procedure vital signs reviewed and stable Respiratory status: spontaneous breathing, nonlabored ventilation and respiratory function stable Cardiovascular status: stable and blood pressure returned to baseline Postop Assessment: no apparent nausea or vomiting Anesthetic complications: no   No notable events documented.  Last Vitals:  Vitals:   04/13/24 1130 04/13/24 1145  BP: 130/72 135/73  Pulse: 67 67  Resp: 11 15  Temp:  36.6 C  SpO2: 99% 100%    Last Pain:  Vitals:   04/13/24 1145  TempSrc:   PainSc: 0-No pain                 Garnette FORBES Skillern

## 2024-04-13 NOTE — Consult Note (Signed)
 Reason for Consult: Lower GI tract bleeding constipation concern for colon cancer after colonoscopy Referring Physician: Vernon MD   Melissa Knight is an 89 y.o. female.  HPI: Pleasant 89 year old female who came in due to bleeding per rectum and constipation.  She also has had failure to thrive and weight loss.  She takes Coumadin  for atrial fibrillation.  Colonoscopy was done today which showed a mass in the distal transverse/proximal descending colon which was partially obstructing.  Biopsy was taken.  Surgery was consulted for surgical management.  She states he has been losing weight over the last 3 months.  She has issues with constipation and blood in her stool.  Unclear how much weight she is lost.  Past Medical History:  Diagnosis Date   Acute ischemic stroke (HCC) 06/12/2012   acute right parietal ischemic stroke - likely cardioembolic in setting of AFib => coumadin  started and followed by PCP   Atrial fibrillation (HCC) 06/12/2012   Echocardiogram 06/30/12: EF 60-65%, mild MR, PASP 33.   Coagulopathy (HCC) 04/09/2024   Hyperlipidemia    Hypertension    Hypothyroidism    Lichen sclerosus et atrophicus of the vulva    PONV (postoperative nausea and vomiting)    Seizure (HCC) 06/12/2012   in the setting of acute ischemic stroke    Past Surgical History:  Procedure Laterality Date   BLADDER SURGERY     bladder prolapse   CATARACT EXTRACTION Left 07/2023   CATARACT EXTRACTION W/PHACO Left 08/04/2023   Procedure: CATARACT EXTRACTION PHACO AND INTRAOCULAR LENS PLACEMENT (IOC);  Surgeon: Juli Blunt, MD;  Location: [facility];  Service: Ophthalmology;  Laterality: Left;  CDE: 6.75   TUBAL LIGATION      Family History  Problem Relation Age of Onset   Atrial fibrillation Mother        pacemaker   Hypertension Mother    Heart disease Sister        MI in 63s   Stroke Sister    Heart disease Father    Heart disease Sister    Heart disease Sister    Hypertension Sister     Anxiety disorder Sister    Heart disease Brother    Cancer Brother        pancreatic   Heart disease Son    Stroke Son 41    Social History:  reports that she quit smoking about 45 years ago. Her smoking use included cigarettes. She has never used smokeless tobacco. She reports that she does not drink alcohol and does not use drugs.  Allergies:  Allergies  Allergen Reactions   Sulfa Antibiotics Rash    rash    Medications: I have reviewed the patient's current medications.  Results for orders placed or performed during the hospital encounter of 04/08/24 (from the past 48 hours)  CBC     Status: Abnormal   Collection Time: 04/12/24  3:15 AM  Result Value Ref Range   WBC 5.1 4.0 - 10.5 K/uL   RBC 3.46 (L) 3.87 - 5.11 MIL/uL   Hemoglobin 9.8 (L) 12.0 - 15.0 g/dL   HCT 67.7 (L) 63.9 - 53.9 %   MCV 93.1 80.0 - 100.0 fL   MCH 28.3 26.0 - 34.0 pg   MCHC 30.4 30.0 - 36.0 g/dL   RDW 86.2 88.4 - 84.4 %   Platelets 172 150 - 400 K/uL   nRBC 0.0 0.0 - 0.2 %    Comment: Performed at [facility], [address]  [place], [place] [address]  Protime-INR     Status: Abnormal   Collection Time: 04/12/24  3:15 AM  Result Value Ref Range   Prothrombin Time 25.2 (H) 11.4 - 15.2 seconds   INR 2.2 (H) 0.8 - 1.2    Comment: (NOTE) INR goal varies based on device and disease states. Performed at [facility], [address], [place], [place] [address]   Basic metabolic panel with GFR     Status: Abnormal   Collection Time: 04/12/24  3:15 AM  Result Value Ref Range   Sodium 135 135 - 145 mmol/L   Potassium 3.5 3.5 - 5.1 mmol/L   Chloride 106 98 - 111 mmol/L   CO2 23 22 - 32 mmol/L   Glucose, Bld 85 70 - 99 mg/dL    Comment: Glucose reference range applies only to samples taken after fasting for at least 8 hours.   BUN <5 (L) 8 - 23 mg/dL   Creatinine, Ser 9.13 0.44 - 1.00 mg/dL   Calcium  8.2 (L) 8.9 - 10.3 mg/dL   GFR, Estimated >39 >39 mL/min     Comment: (NOTE) Calculated using the CKD-EPI Creatinine Equation (2021)    Anion gap 6 5 - 15    Comment: Performed at [facility], [address] [place], [place] [address]  Protime-INR     Status: Abnormal   Collection Time: 04/12/24  4:00 PM  Result Value Ref Range   Prothrombin Time 20.0 (H) 11.4 - 15.2 seconds   INR 1.6 (H) 0.8 - 1.2    Comment: (NOTE) INR goal varies based on device and disease states. Performed at [facility], [address], [place], [place] [address]   Protime-INR     Status: Abnormal   Collection Time: 04/13/24  3:56 AM  Result Value Ref Range   Prothrombin Time 19.4 (H) 11.4 - 15.2 seconds   INR 1.6 (H) 0.8 - 1.2    Comment: (NOTE) INR goal varies based on device and disease states. Performed at [facility], [address], [place], [place] [address]   Basic metabolic panel with GFR     Status: Abnormal   Collection Time: 04/13/24  3:56 AM  Result Value Ref Range   Sodium 137 135 - 145 mmol/L   Potassium 3.9 3.5 - 5.1 mmol/L   Chloride 108 98 - 111 mmol/L   CO2 20 (L) 22 - 32 mmol/L   Glucose, Bld 99 70 - 99 mg/dL    Comment: Glucose reference range applies only to samples taken after fasting for at least 8 hours.   BUN <5 (L) 8 - 23 mg/dL   Creatinine, Ser 9.15 0.44 - 1.00 mg/dL   Calcium  8.6 (L) 8.9 - 10.3 mg/dL   GFR, Estimated >39 >39 mL/min    Comment: (NOTE) Calculated using the CKD-EPI Creatinine Equation (2021)    Anion gap 9 5 - 15    Comment: Performed at [facility], [address], [place], [place] [address]    No results found.  Review of Systems  Constitutional:  Positive for activity change, appetite change, fatigue and unexpected weight change.  Respiratory: Negative.    Cardiovascular:  Positive for leg swelling.  Gastrointestinal:  Positive for blood in stool and constipation.  All other systems reviewed and are negative.  Blood pressure  (!) 144/75, pulse 63, temperature 97.7 F (36.5 C), resp. rate 17, height 5' 2 (1.575 m), weight 65.8 kg, SpO2 99%.  Physical Exam Vitals reviewed.  HENT:     Head: Normocephalic.  Cardiovascular:     Rate and Rhythm: Rhythm irregular.  Pulmonary:     Effort: Pulmonary effort is normal.  Abdominal:     General: Abdomen is flat.     Palpations: Abdomen is soft.     Tenderness: There is no abdominal tenderness.  Neurological:     General: No focal deficit present.     Mental Status: She is alert.  Psychiatric:        Mood and Affect: Mood normal.     Assessment/Plan: Colon: Mass involving distal transverse proximal descending colon partially obstructing high suspicion for malignancy  Discussed the role of surgery.  This will be done sometime next week.  Recommend cardiology consultation for preop assessment of risk.  She also has a pulmonary nodule which could be metastatic disease.  Recommend oncology consultation.  Surgery will see her back again Monday to decide on surgical timing which she seems to have an interest in.  I discussed with her even if it is benign, it is near obstructing and causing bleeding.  Recommend holding Coumadin  and bridging with heparin  if she needs it.  Melissa Knight 04/13/2024, 5:57 PM    High complexity

## 2024-04-13 NOTE — Op Note (Signed)
 [facility] Patient Name: Melissa Knight Procedure Date: 04/13/2024 MRN: [id number] Attending MD: Layla Lah , MD, [id number] Date of Birth: 03-06-1933 CSN: [id number] Age: 89 Admit Type: Inpatient Procedure:                Colonoscopy Indications:              Rectal bleeding, Abnormal CT of the GI tract Providers:                Layla Lah, MD, Hoy Penner, RN,                            Haskel Chris, Technician Referring MD:              Medicines:                Sedation Administered by an Anesthesia Professional Complications:            No immediate complications. Estimated Blood Loss:     Estimated blood loss was minimal. Procedure:                Pre-Anesthesia Assessment:                           - Prior to the procedure, a History and Physical                            was performed, and patient medications and                            allergies were reviewed. The patient's tolerance of                            previous anesthesia was also reviewed. The risks                            and benefits of the procedure and the sedation                            options and risks were discussed with the patient.                            All questions were answered, and informed consent                            was obtained. Prior Anticoagulants: The patient has                            taken Coumadin  (warfarin). ASA Grade Assessment:                            III - A patient with severe systemic disease. After                            reviewing the risks and benefits, the patient was  deemed in satisfactory condition to undergo the                            procedure.                           After obtaining informed consent, the colonoscope                            was passed under direct vision. Throughout the                            procedure, the patient's blood pressure, pulse, and                             oxygen saturations were monitored continuously. The                            PCF-HQ190L ([id number]) Olympus colonoscope was                            introduced through the anus and advanced to the the                            transverse colon to examine a mass. This was the                            intended extent. The colonoscopy was performed with                            difficulty due to a partially obstructing mass. The                            patient tolerated the procedure well. The quality                            of the bowel preparation was adequate to identify                            polyps greater than 5 mm in size. The rectum was                            photographed. Scope In: 10:47:40 AM Scope Out: 11:01:40 AM Scope Withdrawal Time: 0 hours 9 minutes 0 seconds  Total Procedure Duration: 0 hours 14 minutes 0 seconds  Findings:      The perianal and digital rectal examinations were normal.      A frond-like/villous and infiltrative partially obstructing large mass       was found in the distal transverse colon/ Proximal Decending colon. The       mass was circumferential. Biopsies were taken with a cold forceps for       histology. Distal Area was tattooed with an injection of 1 mL of Spot       (carbon black).      Two pedunculated and  sessile polyps were found in the sigmoid colon. The       polyps were 4 to 10 mm in size. These polyps were removed with a cold       snare. Resection and retrieval were complete.      A few small-mouthed diverticula were found in the sigmoid colon.      Internal hemorrhoids were found during retroflexion. The hemorrhoids       were medium-sized. Impression:               - Likely malignant partially obstructing tumor in                            the distal transverse colon. Biopsied. Tattooed.                           - Two 4 to 10 mm polyps in the sigmoid colon,                            removed with a  cold snare. Resected and retrieved.                           - Diverticulosis in the sigmoid colon.                           - Internal hemorrhoids. Moderate Sedation:      Moderate (conscious) sedation was personally administered by an       anesthesia professional. The following parameters were monitored: oxygen       saturation, heart rate, blood pressure, and response to care. Recommendation:           - Patient has a contact number available for                            emergencies. The signs and symptoms of potential                            delayed complications were discussed with the                            patient. Return to normal activities tomorrow.                            Written discharge instructions were provided to the                            patient.                           - Resume previous diet.                           - Continue present medications.                           - Await pathology results. Procedure Code(s):        --- Professional ---  54614, 52, Colonoscopy, flexible; with removal of                            tumor(s), polyp(s), or other lesion(s) by snare                            technique                           45380, 59,52, Colonoscopy, flexible; with biopsy,                            single or multiple                           45381, 52, Colonoscopy, flexible; with directed                            submucosal injection(s), any substance Diagnosis Code(s):        --- Professional ---                           D49.0, Neoplasm of unspecified behavior of                            digestive system                           K56.690, Other partial intestinal obstruction                           D12.5, Benign neoplasm of sigmoid colon                           K64.8, Other hemorrhoids                           K62.5, Hemorrhage of anus and rectum                           K57.30, Diverticulosis of  large intestine without                            perforation or abscess without bleeding                           R93.3, Abnormal findings on diagnostic imaging of                            other parts of digestive tract CPT copyright 2022 American [employer]. All rights reserved. The codes documented in this report are preliminary and upon coder review may  be revised to meet current compliance requirements. Layla Lah, MD Layla Lah, MD 04/13/2024 11:23:17 AM Number of Addenda: 0

## 2024-04-13 NOTE — Interval H&P Note (Signed)
 History and Physical Interval Note:  04/13/2024 10:30 AM  Melissa Knight  has presented today for surgery, with the diagnosis of Abnormal CT imaging, blood per rectum, unintentional weight loss.  The various methods of treatment have been discussed with the patient and family. After consideration of risks, benefits and other options for treatment, the patient has consented to  Procedure(s): COLONOSCOPY (N/A) as a surgical intervention.  The patient's history has been reviewed, patient examined, no change in status, stable for surgery.  I have reviewed the patient's chart and labs.  Questions were answered to the patient's satisfaction.     Melissa Knight

## 2024-04-13 NOTE — Plan of Care (Signed)

## 2024-04-13 NOTE — Brief Op Note (Signed)
 04/13/2024  11:26 AM  PATIENT:  Melissa Knight  89 y.o. female  PRE-OPERATIVE DIAGNOSIS:  Abnormal CT imaging, blood per rectum, unintentional weight loss  POST-OPERATIVE DIAGNOSIS:  transverse colon mass biopsies r/o malignancy; transverse colon mass submucosal tattoo; sigmoid colon polyps x 2 cold snare  PROCEDURE:  Procedure(s): COLONOSCOPY (N/A)  SURGEON:  Surgeons and Role:    * Keiland Pickering, MD - Primary  Findings ------------ - Colonoscopy showed partially obstructing mass with pinpoint opening in the distal transverse/proximal descending colon.  Biopsies taken.  Distal area tattooed.  2 other polyps from sigmoid colon removed with a cold snare.  Recommendations -------------------------- - Follow-up pathology.  Recommend surgical consultation for evaluation of partial colon resection as the colon mass in the transverse colon would likely be obstructive in the near future. - Also recommend oncology consult once biopsy results are back.  Recent scan showed pulmonary nodule and she may have metastatic cancer. - May need to consider discussion of goal of care before doing aggressive intervention. - GI will sign off.  Call us  back if needed. - Okay to resume Coumadin  after 48 hours.  Layla Lah MD, FACP 04/13/2024, 11:27 AM  Contact #  [phone]

## 2024-04-13 NOTE — Plan of Care (Signed)
  Problem: Coping: Goal: Level of anxiety will decrease Outcome: Progressing   Problem: Elimination: Goal: Will not experience complications related to bowel motility Outcome: Progressing Goal: Will not experience complications related to urinary retention Outcome: Progressing   Problem: Safety: Goal: Ability to remain free from injury will improve Outcome: Progressing   

## 2024-04-13 NOTE — Anesthesia Preprocedure Evaluation (Addendum)
 Anesthesia Evaluation  Patient identified by MRN, date of birth, ID band Patient awake    Reviewed: Allergy & Precautions, NPO status , Patient's Chart, lab work & pertinent test results, reviewed documented beta blocker date and time   History of Anesthesia Complications (+) PONV and history of anesthetic complications  Airway Mallampati: III  TM Distance: >3 FB Neck ROM: Full    Dental  (+) Teeth Intact, Dental Advisory Given, Partial Upper,    Pulmonary former smoker Lung mets    Pulmonary exam normal breath sounds clear to auscultation       Cardiovascular hypertension, Pt. on medications and Pt. on home beta blockers Normal cardiovascular exam+ dysrhythmias Atrial Fibrillation  Rhythm:Irregular Rate:Abnormal     Neuro/Psych Seizures -,  CVA    GI/Hepatic negative GI ROS, Neg liver ROS,,,Colon cancer    Endo/Other  Hypothyroidism    Renal/GU negative Renal ROS     Musculoskeletal negative musculoskeletal ROS (+)    Abdominal   Peds  Hematology  (+) Blood dyscrasia (Warfarin), anemia   Anesthesia Other Findings   Reproductive/Obstetrics                              Anesthesia Physical Anesthesia Plan  ASA: 4  Anesthesia Plan: MAC   Post-op Pain Management: Minimal or no pain anticipated   Induction: Intravenous  PONV Risk Score and Plan: 3 and TIVA and Treatment may vary due to age or medical condition  Airway Management Planned: Natural Airway and Simple Face Mask  Additional Equipment:   Intra-op Plan:   Post-operative Plan:   Informed Consent: I have reviewed the patients History and Physical, chart, labs and discussed the procedure including the risks, benefits and alternatives for the proposed anesthesia with the patient or authorized representative who has indicated his/her understanding and acceptance.     Dental advisory given  Plan Discussed with:  CRNA  Anesthesia Plan Comments:          Anesthesia Quick Evaluation

## 2024-04-13 NOTE — Progress Notes (Addendum)
 PROGRESS NOTE    Melissa Knight  FMW:[id number] DOB: September 13, 1932 DOA: 04/08/2024 PCP: [name], FNP   Brief Narrative:  HPI: Melissa Knight is a 89 y.o. female with history of permanent atrial fibrillation, hypertension, hypothyroidism, embolic stroke presents to the ER with complaints of abdominal pain with nausea vomiting and diarrhea.  Patient has been having the symptoms for the last 3 days.  Patient had similar symptoms about a month ago which was self-limited.  Patient states she has lost at least 50 pounds in the last 1 year.  In the last 3 days she has been noticing increasing crampy abdominal pain with diarrhea which was bloody.  Denies any fever or chills.   ED Course: In the ER CT abdomen pelvis shows features concerning for colonic mass in the transverse colon concerning for colon cancer.  There is also lung nodules in the chest fields concerning for possible metastasis.  Labs show potassium of 3.1 hemoglobin 12.3 WBC 3.2 INR 4.4.  UA shows ketones small positive nitrate and esterase and many bacteria.  Patient admitted for further workup of colonic mass.  Assessment & Plan:   Principal Problem:   Colonic mass Active Problems:   Seizure disorder, generalized convulsive, intractable (HCC)   Permanent atrial fibrillation (HCC)   Hypothyroidism (acquired)   Coagulopathy (HCC)   Hypokalemia   History of embolic stroke   Malnutrition of moderate degree  Colon mass: Concerning for malignancy.  [name] on board.  Patient needs colonoscopy but this has been getting postponed every day due to elevated INR.  Fortunately INR is down to 1.6.  She is scheduled for colonoscopy today.  Addendum: Received a message from Dr. Elicia that she has partially obstructing colon mass at the distal portion of the transverse colon and initial segment of the descending colon.  He recommends consulting general surgery as this may be obstructing in near future.  Biopsies taken.  We will consult  oncology once biopsy results are back.  I have consulted palliative care as well as general surgery/Dr. Cornett.  History of paroxysmal atrial fibrillation/coagulopathy/supratherapeutic INR: Rate is controlled.  Continue metoprolol .  INR now 1.6.  Resume anticoagulation post colonoscopy and once cleared by GI.  Acute normocytic anemia: Likely secondary to cancer.  Slight drop in hemoglobin down to 9.8, no indication of transfusion.  Essential hypertension: Controlled, continue amlodipine  and metoprolol .  Holding lisinopril  for now.  History of stroke: Continue statins.  Holding Coumadin .  Hypokalemia: Will replenish again.  Acquired hypothyroidism: Continue Synthroid .  History of seizure disorder: Continue Keppra .  UTI ruled out: Previous documentation and states that there is possible UTI and patient on empiric antibiotics however patient does not appear to be on any antibiotics.  Patient denies any urinary complaints.  No fever and no leukocytosis.  Doubt UTI.  Asymptomatic bacteriuria ruled in.  DVT prophylaxis: SCDs Start: 04/09/24 0249   Code Status: Full Code  Family Communication:  None present at bedside.  Plan of care discussed with patient in length and he/she verbalized understanding and agreed with it.  Status is: Inpatient Remains inpatient appropriate because: Needs diagnostic colonoscopy   Estimated body mass index is 26.52 kg/m as calculated from the following:   Height as of this encounter: 5' 2 (1.575 m).   Weight as of this encounter: 65.8 kg.    Nutritional Assessment: Body mass index is 26.52 kg/m.SABRA Seen by dietician.  I agree with the assessment and plan as outlined below: Nutrition Status: Nutrition Problem: Moderate Malnutrition Etiology:  chronic illness Signs/Symptoms: mild muscle depletion, percent weight loss (13% in 5.5 months) Percent weight loss: 13 % (in 5.5 months) Interventions: Refer to RD note for recommendations, Boost Breeze  . Skin  Assessment: I have examined the patient's skin and I agree with the wound assessment as performed by the wound care RN as outlined below:    Consultants:  GI  Procedures:  As above  Antimicrobials:  Anti-infectives (From admission, onward)    Start     Dose/Rate Route Frequency Ordered Stop   04/09/24 0430  cefTRIAXone  (ROCEPHIN ) 1 g in sodium chloride  0.9 % 100 mL IVPB  Status:  Discontinued        1 g 200 mL/hr over 30 Minutes Intravenous Every 24 hours 04/09/24 0340 04/09/24 1432         Subjective: Patient seen and examined.  She has no complaints.  Pleasant as always. Objective: Vitals:   04/12/24 2248 04/12/24 2259 04/13/24 0632 04/13/24 0759  BP: 135/75  117/85 131/60  Pulse: (!) 56 (!) 50 (!) 53 (!) 57  Resp: 16 16 16 16   Temp: (!) 97.3 F (36.3 C)  (!) 97.3 F (36.3 C) 97.6 F (36.4 C)  TempSrc: Oral  Oral Oral  SpO2: 98% 98% 99% 99%  Weight:      Height:        Intake/Output Summary (Last 24 hours) at 04/13/2024 0844 Last data filed at 04/13/2024 0600 Gross per 24 hour  Intake 989 ml  Output --  Net 989 ml   Filed Weights   04/08/24 1818  Weight: 65.8 kg    Examination:  General exam: Appears calm and comfortable  Respiratory system: Clear to auscultation. Respiratory effort normal. Cardiovascular system: S1 & S2 heard, RRR. No JVD, murmurs, rubs, gallops or clicks. No pedal edema. Gastrointestinal system: Abdomen is nondistended, soft and nontender. No organomegaly or masses felt. Normal bowel sounds heard. Central nervous system: Alert and oriented. No focal neurological deficits. Extremities: Symmetric 5 x 5 power. Skin: No rashes, lesions or ulcers.  Psychiatry: Judgement and insight appear normal. Mood & affect appropriate.    Data Reviewed: I have personally reviewed following labs and imaging studies  CBC: Recent Labs  Lab 04/09/24 0613 04/09/24 1057 04/10/24 0319 04/11/24 0317 04/12/24 0315  WBC 4.2 4.0 4.6 8.1 5.1  HGB 9.8*  10.1* 10.4* 11.8* 9.8*  HCT 31.0* 32.6* 33.7* 37.7 32.2*  MCV 92.5 93.7 93.1 93.3 93.1  PLT 152 168 166 173 172   Basic Metabolic Panel: Recent Labs  Lab 04/09/24 0320 04/10/24 0319 04/11/24 0317 04/12/24 0315 04/13/24 0356  NA 139 137 137 135 137  K 3.9 3.2* 4.1 3.5 3.9  CL 103 105 104 106 108  CO2 25 23 22 23  20*  GLUCOSE 83 76 149* 85 99  BUN 15 7* <5* <5* <5*  CREATININE 0.85 0.52 0.55 0.86 0.84  CALCIUM  8.1* 8.0* 8.6* 8.2* 8.6*   GFR: Estimated Creatinine Clearance: 40.4 mL/min (by C-G formula based on SCr of 0.84 mg/dL). Liver Function Tests: Recent Labs  Lab 04/08/24 1822 04/09/24 0320  AST 19 14*  ALT 7 9  ALKPHOS 38 27*  BILITOT 0.6 1.0  PROT 6.4* 5.1*  ALBUMIN 3.8 2.7*   Recent Labs  Lab 04/08/24 1822  LIPASE 15   No results for input(s): AMMONIA in the last 168 hours. Coagulation Profile: Recent Labs  Lab 04/10/24 1927 04/11/24 0317 04/12/24 0315 04/12/24 1600 04/13/24 0356  INR 3.4* 3.1* 2.2* 1.6* 1.6*  Cardiac Enzymes: No results for input(s): CKTOTAL, CKMB, CKMBINDEX, TROPONINI in the last 168 hours. BNP (last 3 results) No results for input(s): PROBNP in the last 8760 hours. HbA1C: No results for input(s): HGBA1C in the last 72 hours. CBG: No results for input(s): GLUCAP in the last 168 hours. Lipid Profile: No results for input(s): CHOL, HDL, LDLCALC, TRIG, CHOLHDL, LDLDIRECT in the last 72 hours. Thyroid  Function Tests: No results for input(s): TSH, T4TOTAL, FREET4, T3FREE, THYROIDAB in the last 72 hours. Anemia Panel: No results for input(s): VITAMINB12, FOLATE, FERRITIN, TIBC, IRON, RETICCTPCT in the last 72 hours. Sepsis Labs: Recent Labs  Lab 04/08/24 1955  LATICACIDVEN 1.9    Recent Results (from the past 240 hours)  C Difficile Quick Screen w PCR reflex     Status: None   Collection Time: 04/09/24  7:44 AM   Specimen: STOOL  Result Value Ref Range Status   C Diff  antigen NEGATIVE NEGATIVE Final   C Diff toxin NEGATIVE NEGATIVE Final   C Diff interpretation No C. difficile detected.  Final    Comment: Performed at [facility], [address] [place], [place] [address]  Gastrointestinal Panel by PCR , Stool     Status: None   Collection Time: 04/09/24  7:44 AM   Specimen: Stool  Result Value Ref Range Status   Campylobacter species NOT DETECTED NOT DETECTED Final   Plesimonas shigelloides NOT DETECTED NOT DETECTED Final   Salmonella species NOT DETECTED NOT DETECTED Final   Yersinia enterocolitica NOT DETECTED NOT DETECTED Final   Vibrio species NOT DETECTED NOT DETECTED Final   Vibrio cholerae NOT DETECTED NOT DETECTED Final   Enteroaggregative E coli (EAEC) NOT DETECTED NOT DETECTED Final   Enteropathogenic E coli (EPEC) NOT DETECTED NOT DETECTED Final   Enterotoxigenic E coli (ETEC) NOT DETECTED NOT DETECTED Final   Shiga like toxin producing E coli (STEC) NOT DETECTED NOT DETECTED Final   Shigella/Enteroinvasive E coli (EIEC) NOT DETECTED NOT DETECTED Final   Cryptosporidium NOT DETECTED NOT DETECTED Final   Cyclospora cayetanensis NOT DETECTED NOT DETECTED Final   Entamoeba histolytica NOT DETECTED NOT DETECTED Final   Giardia lamblia NOT DETECTED NOT DETECTED Final   Adenovirus F40/41 NOT DETECTED NOT DETECTED Final   Astrovirus NOT DETECTED NOT DETECTED Final   Norovirus GI/GII NOT DETECTED NOT DETECTED Final   Rotavirus A NOT DETECTED NOT DETECTED Final   Sapovirus (I, II, IV, and V) NOT DETECTED NOT DETECTED Final    Comment: Performed at [facility], [address], [place], [place] [address]     Radiology Studies: No results found.   Scheduled Meds:  amLODipine   2.5 mg Oral Daily   atorvastatin   10 mg Oral q1800   feeding supplement  1 Container Oral TID BM   levETIRAcetam   500 mg Oral Daily   levothyroxine   88 mcg Oral Q0600   metoprolol  tartrate  100 mg Oral Daily   And   metoprolol   tartrate  50 mg Oral QHS   Continuous Infusions:     LOS: 4 days   Fredia Skeeter, MD [facility]  04/13/2024, 8:44 AM   *Please note that this is a verbal dictation therefore any spelling or grammatical errors are due to the Dragon [employer] One system interpretation.  Please page via Amion and do not message via secure chat for urgent patient care matters. Secure chat can be used for non urgent patient care matters.  How to contact the [facility] Attending  or Consulting provider 7A - 7P or covering provider during after hours 7P -7A, for this patient?  Check the care team in [facility] and look for a) attending/consulting [facility] provider listed and b) the [facility] team listed. Page or secure chat 7A-7P. Log into [email or web address] and use [place]'s universal password to access. If you do not have the password, please contact the hospital operator. Locate the [facility] provider you are looking for under [facility] and page to a number that you can be directly reached. If you still have difficulty reaching the provider, please page the [facility] (Director on Call) for the Hospitalists listed on amion for assistance.

## 2024-04-14 DIAGNOSIS — K6389 Other specified diseases of intestine: Secondary | ICD-10-CM | POA: Diagnosis not present

## 2024-04-14 DIAGNOSIS — Z7189 Other specified counseling: Secondary | ICD-10-CM | POA: Diagnosis not present

## 2024-04-14 DIAGNOSIS — Z515 Encounter for palliative care: Secondary | ICD-10-CM | POA: Diagnosis not present

## 2024-04-14 DIAGNOSIS — Z8673 Personal history of transient ischemic attack (TIA), and cerebral infarction without residual deficits: Secondary | ICD-10-CM | POA: Diagnosis not present

## 2024-04-14 DIAGNOSIS — E440 Moderate protein-calorie malnutrition: Secondary | ICD-10-CM

## 2024-04-14 LAB — BASIC METABOLIC PANEL WITH GFR
Anion gap: 9 (ref 5–15)
BUN: <5 mg/dL — ABNORMAL LOW (ref 8–23)
CO2: 22 mmol/L (ref 22–32)
Calcium: 8.5 mg/dL — ABNORMAL LOW (ref 8.9–10.3)
Chloride: 106 mmol/L (ref 98–111)
Creatinine, Ser: 0.52 mg/dL (ref 0.44–1.00)
GFR, Estimated: >60 mL/min (ref >60)
Glucose, Bld: 120 mg/dL — ABNORMAL HIGH (ref 70–99)
Potassium: 3.8 mmol/L (ref 3.5–5.1)
Sodium: 137 mmol/L (ref 135–145)

## 2024-04-14 LAB — CBC
HCT: 40.4 % (ref 36.0–46.0)
Hemoglobin: 11.9 g/dL — ABNORMAL LOW (ref 12.0–15.0)
MCH: 28.2 pg (ref 26.0–34.0)
MCHC: 29.5 g/dL — ABNORMAL LOW (ref 30.0–36.0)
MCV: 95.7 fL (ref 80.0–100.0)
Platelets: 201 K/uL (ref 150–400)
RBC: 4.22 MIL/uL (ref 3.87–5.11)
RDW: 13.6 % (ref 11.5–15.5)
WBC: 7.7 K/uL (ref 4.0–10.5)
nRBC: 0 % (ref 0.0–0.2)

## 2024-04-14 LAB — PROTIME-INR
INR: 1.7 — ABNORMAL HIGH (ref 0.8–1.2)
Prothrombin Time: 20.9 s — ABNORMAL HIGH (ref 11.4–15.2)

## 2024-04-14 LAB — HEPARIN LEVEL (UNFRACTIONATED): Heparin Unfractionated: 0.46 [IU]/mL (ref 0.30–0.70)

## 2024-04-14 MED ORDER — HEPARIN (PORCINE) 25000 UT/250ML-% IV SOLN
700.0000 [IU]/h | INTRAVENOUS | Status: DC
  Administered 2024-04-14: 900 [IU]/h via INTRAVENOUS
  Administered 2024-04-15: 800 [IU]/h via INTRAVENOUS
  Filled 2024-04-14 (×2): qty 250

## 2024-04-14 MED ORDER — ORAL CARE MOUTH RINSE
15.0000 mL | OROMUCOSAL | Status: DC | PRN
Start: 2024-04-14 — End: 2024-04-24

## 2024-04-14 NOTE — Plan of Care (Signed)

## 2024-04-14 NOTE — Consult Note (Signed)
 Consultation Note Date: 04/14/2024   Patient Name: Melissa Knight  DOB: [birth year]  MRN: [id number]  Age / Sex: 89 y.o., female   PCP: [name], FNP Referring Physician: Vernon Ranks, MD  Reason for Consultation: Establishing goals of care     Chief Complaint/History of Present Illness:   Patient is an 89 year old female with a past medical history of permanent atrial fibrillation, hypertension, hypothyroidism, and embolic stroke who was admitted on 04/08/2024 for abdominal pain, nausea with vomiting, and diarrhea for 3 days prior to admission.  Patient also noted she had lost at least 50 pounds in the past year.  During hospitalization patient found to have colon mass concerning for malignancy.  GI was consulted.  Colonoscopy performed 04/13/2024 showing partially obstructing mass with pinpoint opening in the distal transverse/proximal descending colon.  Biopsies were taken.  Surgery consulted for evaluation as patient may need partial colectomy.  Imaging also showed possible nodules in the lung that are concerning for metastatic disease.  Palliative medicine team consulted to assist with complex medical decision making.  Extensive review of EMR prior to presenting to bedside including recent documentation from GI, hospitalist, and surgery.  Personally reviewed recent CT abdomen of pelvis noting mass in colon.  Presented to bedside to see patient.  Patient sitting up comfortably in chair.  No visitors present at bedside.  With permission able to introduce myself as a member of the palliative medicine team my role in patient's medical journey.  Spent time learning about patient's medical diagnoses at this time.  Patient notes she is awaiting further discussion with surgery team potentially today or tomorrow to discuss pursuing further surgical interventions at this time for her colon mass.  Patient notes that she was incredibly functional prior to this hospitalization and she is hoping to  regain some of her strength.  Patient would be willing to go through with surgery to assist with management of this colon mass.  Discussed that if patient is diagnosed with cancer, would need oncologic involvement and possible cancer directed therapies.  Patient acknowledged this and noted she would be willing to consider further cancer directed therapy such as chemotherapy depending on the diagnosis.  Acknowledged this.  Discussed importance of maintaining good functional status to allow patient to receive further cancer directed therapies.  Patient acknowledged this.  Also discussed advance care planning.  Patient denied having any advance care planning documentation.  Patient states that if she was unable to make decisions for herself she would want her son, Cathlyn, to make medical decisions on her behalf.  Patient notes that her husband Sharolyn has cognitive impairment and multiple medical conditions of his own that have required him currently to be in rehab.  Noted would consult chaplain to assist with completion of HCPOA documentation.  Did discuss CODE STATUS at this time as well.  Explained full code versus DNR/DNI.  Patient noted that at this time she would like to remain full code.  Patient states she has told multiple family members that she would not want to be kept on life support indefinitely.  Discussed that if patient does have worsening medical status including diagnosis of metastatic cancer, would recommend reconsidering and possible transition to DNR/DNI while continuing appropriate medical interventions.  Patient acknowledged this and noted she would continue discussions based on her status moving forward.  Inquired about symptom management at this time and patient does not have any concerns related to symptoms.  All questions answered at that time.  Noted palliative medicine  team continue to follow with patient's medical journey.  Primary Diagnoses  Present on Admission:  Colonic mass   Permanent atrial fibrillation (HCC)  Seizure disorder, generalized convulsive, intractable (HCC)  Hypothyroidism (acquired)   Palliative Review of Systems: Denies any symptoms of concern at this time  Past Medical History:  Diagnosis Date   Acute ischemic stroke (HCC) 06/12/2012   acute right parietal ischemic stroke - likely cardioembolic in setting of AFib => coumadin  started and followed by PCP   Atrial fibrillation (HCC) 06/12/2012   Echocardiogram 06/30/12: EF 60-65%, mild MR, PASP 33.   Coagulopathy (HCC) 04/09/2024   Hyperlipidemia    Hypertension    Hypothyroidism    Lichen sclerosus et atrophicus of the vulva    PONV (postoperative nausea and vomiting)    Seizure (HCC) 06/12/2012   in the setting of acute ischemic stroke   Social History   Socioeconomic History   Marital status: Married    Spouse name: Not on file   Number of children: 3   Years of education: 13   Highest education level: Some college, no degree  Occupational History   Occupation: retired    Comment: Clinical biochemist  Tobacco Use   Smoking status: Former    Current packs/day: 0.00    Types: Cigarettes    Quit date: 09/30/1978    Years since quitting: 45.5   Smokeless tobacco: Never  Vaping Use   Vaping status: Never Used  Substance and Sexual Activity   Alcohol use: No   Drug use: No   Sexual activity: Not on file  Other Topics Concern   Not on file  Social History Narrative   Not on file   Social Drivers of Health   Financial Resource Strain: Low Risk  (10/26/2023)   Overall Financial Resource Strain (CARDIA)    Difficulty of Paying Living Expenses: Not hard at all  Food Insecurity: No Food Insecurity (04/09/2024)   Hunger Vital Sign    Worried About Running Out of Food in the Last Year: Never true    Ran Out of Food in the Last Year: Never true  Transportation Needs: No Transportation Needs (04/09/2024)   PRAPARE - Administrator, Civil Service (Medical): No    Lack of  Transportation (Non-Medical): No  Physical Activity: Inactive (10/26/2023)   Exercise Vital Sign    Days of Exercise per Week: 0 days    Minutes of Exercise per Session: 0 min  Stress: No Stress Concern Present (10/26/2023)   [employer] of Occupational Health - Occupational Stress Questionnaire    Feeling of Stress : Only a little  Social Connections: Socially Integrated (04/09/2024)   Social Connection and Isolation Panel    Frequency of Communication with Friends and Family: Twice a week    Frequency of Social Gatherings with Friends and Family: More than three times a week    Attends Religious Services: More than 4 times per year    Active Member of [employer] or Organizations: Yes    Attends Banker Meetings: Never    Marital Status: Married   Family History  Problem Relation Age of Onset   Atrial fibrillation Mother        pacemaker   Hypertension Mother    Heart disease Sister        MI in 105s   Stroke Sister    Heart disease Father    Heart disease Sister    Heart disease Sister    Hypertension Sister  Anxiety disorder Sister    Heart disease Brother    Cancer Brother        pancreatic   Heart disease Son    Stroke Son 53   Scheduled Meds:  amLODipine   2.5 mg Oral Daily   atorvastatin   10 mg Oral q1800   feeding supplement  1 Container Oral TID BM   levETIRAcetam   500 mg Oral Daily   levothyroxine   88 mcg Oral Q0600   metoprolol  tartrate  100 mg Oral Daily   And   metoprolol  tartrate  50 mg Oral QHS   Continuous Infusions: PRN Meds:.acetaminophen  **OR** acetaminophen , ondansetron  **OR** ondansetron  (ZOFRAN ) IV, prochlorperazine  Allergies  Allergen Reactions   Sulfa Antibiotics Rash    rash   CBC:    Component Value Date/Time   WBC 5.1 04/12/2024 0315   HGB 9.8 (L) 04/12/2024 0315   HGB 13.0 05/08/2023 1218   HCT 32.2 (L) 04/12/2024 0315   HCT 39.5 05/08/2023 1218   PLT 172 04/12/2024 0315   PLT 147 (L) 05/08/2023 1218   MCV 93.1  04/12/2024 0315   MCV 90 05/08/2023 1218   NEUTROABS 3.9 05/08/2023 1218   LYMPHSABS 2.0 05/08/2023 1218   MONOABS 1.3 (H) 12/27/2016 1437   EOSABS 0.1 05/08/2023 1218   BASOSABS 0.0 05/08/2023 1218   Comprehensive Metabolic Panel:    Component Value Date/Time   NA 137 04/14/2024 0319   NA 144 05/08/2023 1218   K 3.8 04/14/2024 0319   CL 106 04/14/2024 0319   CO2 22 04/14/2024 0319   BUN <5 (L) 04/14/2024 0319   BUN 12 05/08/2023 1218   CREATININE 0.52 04/14/2024 0319   CREATININE 0.69 01/30/2013 1117   GLUCOSE 120 (H) 04/14/2024 0319   CALCIUM  8.5 (L) 04/14/2024 0319   AST 14 (L) 04/09/2024 0320   ALT 9 04/09/2024 0320   ALKPHOS 27 (L) 04/09/2024 0320   BILITOT 1.0 04/09/2024 0320   BILITOT 0.5 05/08/2023 1218   PROT 5.1 (L) 04/09/2024 0320   PROT 5.8 (L) 05/08/2023 1218   ALBUMIN 2.7 (L) 04/09/2024 0320   ALBUMIN 3.8 05/08/2023 1218    Physical Exam: Vital Signs: BP (!) 147/64 (BP Location: Right Arm)   Pulse (!) 58   Temp 98.7 F (37.1 C) (Oral)   Resp 19   Ht 5' 2 (1.575 m)   Wt 65.8 kg   SpO2 98%   BMI 26.53 kg/m  SpO2: SpO2: 98 % O2 Device: O2 Device: Room Air O2 Flow Rate:   Intake/output summary:  Intake/Output Summary (Last 24 hours) at 04/14/2024 0754 Last data filed at 04/14/2024 9376 Gross per 24 hour  Intake 1220 ml  Output 300 ml  Net 920 ml   LBM: Last BM Date : 04/13/24 (patient said is was mostly bloody after her colonoscopy today) Baseline Weight: Weight: 65.8 kg Most recent weight: Weight: 65.8 kg  General: NAD, alert, pleasant, elderly  Cardiovascular: RRR Respiratory: no increased work of breathing noted, not in respiratory distress Neuro: A&Ox4, following commands easily Psych: appropriately answers all questions          Palliative Performance Scale: 70%              Additional Data Reviewed: Recent Labs    04/12/24 0315 04/13/24 0356 04/14/24 0319  WBC 5.1  --   --   HGB 9.8*  --   --   PLT 172  --   --   NA 135 137 137   BUN <5* <5* <  5*  CREATININE 0.86 0.84 0.52    Imaging: CT ABDOMEN PELVIS W CONTRAST CLINICAL DATA:  Abdominal pain, diarrhea, vomiting, bilateral lower quadrant pain. Symptoms began on Friday.  EXAM: CT ABDOMEN AND PELVIS WITH CONTRAST  TECHNIQUE: Multidetector CT imaging of the abdomen and pelvis was performed using the standard protocol following bolus administration of intravenous contrast.  RADIATION DOSE REDUCTION: This exam was performed according to the departmental dose-optimization program which includes automated exposure control, adjustment of the mA and/or kV according to patient size and/or use of iterative reconstruction technique.  CONTRAST:  OMNIPAQUE  IOHEXOL  300 MG/ML  SOLN  COMPARISON:  None Available.  FINDINGS: Lower chest: Motion artifact limits examination. Tiny low-density pulmonary nodules are demonstrated. Perhaps the most prominent is in the right lower lung, series 5, image 14, measuring 4 mm in diameter. Linear scarring in the right base.  Hepatobiliary: Scattered calcifications in the liver, likely granulomas. Gallbladder and bile ducts are normal.  Pancreas: Unremarkable. No pancreatic ductal dilatation or surrounding inflammatory changes.  Spleen: Multiple calcified granulomas in the spleen.  Adrenals/Urinary Tract: No adrenal gland nodules. Lower pole left renal cyst measuring 4.4 cm diameter. No imaging follow-up is indicated. Nephrograms are symmetrical. No hydronephrosis or hydroureter. Bladder is normal for degree of distention.  Stomach/Bowel: Stomach and small bowel are mostly decompressed. Small duodenal diverticulum. Liquid stool demonstrated throughout the colon. The right hemicolon is moderately distended. There is an annular constricting mass in the mid transverse colon likely representing colon cancer. The appendix is normal.  Vascular/Lymphatic: Aortic atherosclerosis. No enlarged abdominal or pelvic lymph  nodes.  Reproductive: Uterus and bilateral adnexa are unremarkable.  Other: Small amount of free fluid in the upper abdomen and pelvis is probably reactive. No free air. Abdominal wall musculature appears intact.  Musculoskeletal: Degenerative changes in the spine. No acute bony abnormalities. No destructive bone lesions.  IMPRESSION: 1. There is an annular constricting lesion in the mid transverse colon likely representing colon cancer. There is mild proximal colonic dilatation. 2. Liquid stool in the colon likely representing infectious colitis. 3. Appendix is normal. 4. Aortic atherosclerosis. 5. Multiple pulmonary nodules, largest measuring 4 mm. In the setting of probable neoplasm, metastatic disease is not excluded although still unlikely. Oncological follow-up is recommended.  Electronically Signed   By: Elsie Gravely M.D.   On: 04/08/2024 20:49    I personally reviewed recent imaging.   Palliative Care Assessment and Plan Summary of Established Goals of Care and Medical Treatment Preferences   Patient is an 89 year old female with a past medical history of permanent atrial fibrillation, hypertension, hypothyroidism, and embolic stroke who was admitted on 04/08/2024 for abdominal pain, nausea with vomiting, and diarrhea for 3 days prior to admission.  Patient also noted she had lost at least 50 pounds in the past year.  During hospitalization patient found to have colon mass concerning for malignancy.  GI was consulted.  Colonoscopy performed 04/13/2024 showing partially obstructing mass with pinpoint opening in the distal transverse/proximal descending colon.  Biopsies were taken.  Surgery consulted for evaluation as patient may need partial colectomy.  Imaging also showed possible nodules in the lung that are concerning for metastatic disease.  Palliative medicine team consulted to assist with complex medical decision making.  # Complex medical decision making/goals of  care  - Discussed care with patient as detailed above in HPI.  Patient has new diagnosis of colon mass.  Awaiting biopsies to determine if cancer and possible further workup to determine if metastatic  cancer.  Patient willing to proceed with surgery for management of colon mass.  Patient is also willing to consider cancer directed therapies if needed moving forward.  Patient had good functional status prior to hospitalization so noted importance of maintaining high functional status to do well with cancer directed therapies if pursued.  - Patient stated that if she was unable to make medical decisions on her behalf, she would want her son, Cathlyn, to make medical decisions for her.  Patient notes that her husband has cognitive impairment and his own medical comorbidities and is currently in rehab.  Noted will consult chaplain to assist with completing HCPOA documentation.  -  Code Status: Full Code    - Discussed CODE STATUS with patient as detailed above in HPI.  Patient would like to remain full code at this time.  Patient notes she has told her family that if she were to be on life support and was not having improvement to maintain her quality of life and functionality, would not want to be on life support indefinitely.  Discussed importance of continued conversations moving forward as if patient is diagnosed with metastatic cancer, may need to consider changing CODE STATUS to DNR/DNI while continuing appropriate medical interventions.  Patient acknowledges and willing to continue conversations moving forward.  # Psycho-social/Spiritual Support:  - Support System: Son, husband  # Discharge Planning:  To Be Determined  Thank you for allowing the palliative care team to participate in the care Khaniyah Kindel.  Tinnie Radar, DO Palliative Care Provider PMT # [phone]  If patient remains symptomatic despite maximum doses, please call PMT at [phone] between 0700 and 1900. Outside of these hours,  please call attending, as PMT does not have night coverage.  Personally spent 80 minutes in patient care including extensive chart review (labs, imaging, progress/consult notes, vital signs), medically appropraite exam, discussed with treatment team, education to patient, family, and staff, documenting clinical information, medication review and management, coordination of care, and available advanced directive documents.

## 2024-04-14 NOTE — Progress Notes (Addendum)
 PHARMACY - ANTICOAGULATION CONSULT NOTE  Pharmacy Consult for heparin   Indication: hx atrial fibrillation and stroke (PTA warfarin on hold)  Allergies  Allergen Reactions   Sulfa Antibiotics Rash    rash    Patient Measurements: Height: 5' 2 (157.5 cm) Weight: 65.8 kg (145 lb 1 oz) IBW/kg (Calculated) : 50.1 HEPARIN  DW (KG): 63.6  Vital Signs: Temp: 98.7 F (37.1 C) (08/03 0619) Temp Source: Oral (08/03 0619) BP: 147/64 (08/03 0619) Pulse Rate: 58 (08/03 0619)  Labs: Recent Labs    04/12/24 0315 04/12/24 1600 04/13/24 0356 04/14/24 0319  HGB 9.8*  --   --   --   HCT 32.2*  --   --   --   PLT 172  --   --   --   LABPROT 25.2* 20.0* 19.4* 20.9*  INR 2.2* 1.6* 1.6* 1.7*  CREATININE 0.86  --  0.84 0.52    Estimated Creatinine Clearance: 42.4 mL/min (by C-G formula based on SCr of 0.52 mg/dL).   Medical History: Past Medical History:  Diagnosis Date   Acute ischemic stroke (HCC) 06/12/2012   acute right parietal ischemic stroke - likely cardioembolic in setting of AFib => coumadin  started and followed by PCP   Atrial fibrillation (HCC) 06/12/2012   Echocardiogram 06/30/12: EF 60-65%, mild MR, PASP 33.   Coagulopathy (HCC) 04/09/2024   Hyperlipidemia    Hypertension    Hypothyroidism    Lichen sclerosus et atrophicus of the vulva    PONV (postoperative nausea and vomiting)    Seizure (HCC) 06/12/2012   in the setting of acute ischemic stroke    Medications:  - PTA warfarin regimen: 3.75 mg daily per office note on 03/25/24 (last dose taken on 7/26)   Assessment: Patient is an 89 y.o F with hx CVA and afib on warfarin PTA who presented to the ED on 04/08/24 with c/o abdominal pain and n/v/d.  Abdominal/pelvis CT on 04/08/24 showed colonic mass and lung nodules. Warfarin placed on hold on admission and INR reversed with vit K for colonoscopy procedure. She underwent colonoscopy on 8/2 and was noted to have a partially obstructing large mass in the distal transverse  colon/ proximal descending colon with concern for malignancy.  Pharmacy has been consulted on 04/14/24 to bridge with heparin  drip in case surgery is needed for colon mass.   Significant events:  - 7/29: vit K 2.5 mg PO x1 - 7/30: vit K 10mg  PO x1 in AM  and 5mg  PO x1 in PM - 7/31: vit K 10 mg PO x1 - 8/2: colonoscopy   Today, 04/14/2024: - INR 1.7  - hgb 9.8 and plts 172 on 8/1 - scr stable at 0.52  Goal of Therapy:  Heparin  level 0.3-0.7 units/ml Monitor platelets by anticoagulation protocol: Yes   Plan:  - Heparin  drip at 900 units/hr - check 8 hr heparin  level  - monitor for s/sx bleeding   Sennie Borden P 04/14/2024,8:55 AM  __________________________________  Adden: Heparin  level collected at 5:46 PM is therapeutic at 0.46.  Will f/u with AM labs on 8/4 and adjust dose if needed.  Iantha Batch, PharmD, BCPS 04/14/2024 7:00 PM

## 2024-04-14 NOTE — Progress Notes (Signed)
 PROGRESS NOTE    Melissa Knight  FMW:[id number] DOB: 1933-03-10 DOA: 04/08/2024 PCP: [name], FNP   Brief Narrative:  Melissa Knight is a 89 y.o. female with history of permanent atrial fibrillation, hypertension, hypothyroidism, embolic stroke presents to the ER with complaints of abdominal pain with nausea vomiting and diarrhea since 3 days. she has lost at least 50 pounds in the last 1 year.    In the ER CT abdomen pelvis shows features concerning for colonic mass in the transverse colon concerning for colon cancer.  There is also lung nodules in the chest fields concerning for possible metastasis.  Admitted under hospital service, status post colonoscopy which shows near obstructing mass, general surgery consulted.  Assessment & Plan:   Principal Problem:   Colonic mass Active Problems:   Seizure disorder, generalized convulsive, intractable (HCC)   Permanent atrial fibrillation (HCC)   Hypothyroidism (acquired)   Coagulopathy (HCC)   Hypokalemia   History of embolic stroke   Malnutrition of moderate degree  Colon mass: Concerning for malignancy.  [name] on board.  Status post Colonoscopy 04/13/2024 showed partially obstructing mass with pinpoint opening in the distal transverse/proximal descending colon. Biopsies taken.polyps from sigmoid colon removed with a cold snare.  General surgery consulted per GI recommendations, patient may need partial colectomy.  Cardiology consulted for clearance per general surgery request.  History of paroxysmal atrial fibrillation/coagulopathy/supratherapeutic INR: Rate is controlled.  Continue metoprolol .  Continue to hold Coumadin  for upcoming surgery.  Will start on heparin  in the meantime.  GI cleared her for that.  Acute normocytic anemia: Likely secondary to cancer.  Slight drop in hemoglobin down to 9.8, no indication of transfusion.  Essential hypertension: Controlled, continue amlodipine  and metoprolol .  Holding lisinopril  for  now.  History of stroke: Continue statins.  Holding Coumadin .  Hypokalemia: Resolved.  Acquired hypothyroidism: Continue Synthroid .  History of seizure disorder: Continue Keppra .  UTI ruled out: Previous documentation and states that there is possible UTI and patient on empiric antibiotics however patient does not appear to be on any antibiotics.  Patient denies any urinary complaints.  No fever and no leukocytosis.  Doubt UTI.  Asymptomatic bacteriuria ruled in.  DVT prophylaxis: SCDs Start: 04/09/24 0249   Code Status: Full Code  Family Communication:  None present at bedside.  Plan of care discussed with patient in length and he/she verbalized understanding and agreed with it.  Status is: Inpatient Remains inpatient appropriate because: Needs colectomy.   Estimated body mass index is 26.53 kg/m as calculated from the following:   Height as of this encounter: 5' 2 (1.575 m).   Weight as of this encounter: 65.8 kg.    Nutritional Assessment: Body mass index is 26.53 kg/m.SABRA Seen by dietician.  I agree with the assessment and plan as outlined below: Nutrition Status: Nutrition Problem: Moderate Malnutrition Etiology: chronic illness Signs/Symptoms: mild muscle depletion, percent weight loss (13% in 5.5 months) Percent weight loss: 13 % (in 5.5 months) Interventions: Refer to RD note for recommendations, Boost Breeze  . Skin Assessment: I have examined the patient's skin and I agree with the wound assessment as performed by the wound care RN as outlined below:    Consultants:  GI  Procedures:  As above  Antimicrobials:  Anti-infectives (From admission, onward)    Start     Dose/Rate Route Frequency Ordered Stop   04/09/24 0430  cefTRIAXone  (ROCEPHIN ) 1 g in sodium chloride  0.9 % 100 mL IVPB  Status:  Discontinued  1 g 200 mL/hr over 30 Minutes Intravenous Every 24 hours 04/09/24 0340 04/09/24 1432         Subjective: Patient seen and examined.  She  has no complaints.  She is well aware of the recommendations from general surgery.  She has no questions for me.  Objective: Vitals:   04/13/24 1308 04/13/24 2057 04/13/24 2233 04/14/24 0619  BP: (!) 144/75  130/67 (!) 147/64  Pulse: 63 70 (!) 58 (!) 58  Resp: 17  17 19   Temp: 97.7 F (36.5 C)  97.9 F (36.6 C) 98.7 F (37.1 C)  TempSrc:   Oral Oral  SpO2: 99%  99% 98%  Weight:      Height:        Intake/Output Summary (Last 24 hours) at 04/14/2024 0749 Last data filed at 04/14/2024 9376 Gross per 24 hour  Intake 1220 ml  Output 300 ml  Net 920 ml   Filed Weights   04/08/24 1818 04/13/24 1032  Weight: 65.8 kg 65.8 kg    Examination:  General exam: Appears calm and comfortable  Respiratory system: Clear to auscultation. Respiratory effort normal. Cardiovascular system: S1 & S2 heard, RRR. No JVD, murmurs, rubs, gallops or clicks. No pedal edema. Gastrointestinal system: Abdomen is nondistended, soft and nontender. No organomegaly or masses felt. Normal bowel sounds heard. Central nervous system: Alert and oriented. No focal neurological deficits. Extremities: Symmetric 5 x 5 power. Skin: No rashes, lesions or ulcers.  Psychiatry: Judgement and insight appear normal. Mood & affect appropriate.    Data Reviewed: I have personally reviewed following labs and imaging studies  CBC: Recent Labs  Lab 04/09/24 0613 04/09/24 1057 04/10/24 0319 04/11/24 0317 04/12/24 0315  WBC 4.2 4.0 4.6 8.1 5.1  HGB 9.8* 10.1* 10.4* 11.8* 9.8*  HCT 31.0* 32.6* 33.7* 37.7 32.2*  MCV 92.5 93.7 93.1 93.3 93.1  PLT 152 168 166 173 172   Basic Metabolic Panel: Recent Labs  Lab 04/10/24 0319 04/11/24 0317 04/12/24 0315 04/13/24 0356 04/14/24 0319  NA 137 137 135 137 137  K 3.2* 4.1 3.5 3.9 3.8  CL 105 104 106 108 106  CO2 23 22 23  20* 22  GLUCOSE 76 149* 85 99 120*  BUN 7* <5* <5* <5* <5*  CREATININE 0.52 0.55 0.86 0.84 0.52  CALCIUM  8.0* 8.6* 8.2* 8.6* 8.5*   GFR: Estimated  Creatinine Clearance: 42.4 mL/min (by C-G formula based on SCr of 0.52 mg/dL). Liver Function Tests: Recent Labs  Lab 04/08/24 1822 04/09/24 0320  AST 19 14*  ALT 7 9  ALKPHOS 38 27*  BILITOT 0.6 1.0  PROT 6.4* 5.1*  ALBUMIN 3.8 2.7*   Recent Labs  Lab 04/08/24 1822  LIPASE 15   No results for input(s): AMMONIA in the last 168 hours. Coagulation Profile: Recent Labs  Lab 04/11/24 0317 04/12/24 0315 04/12/24 1600 04/13/24 0356 04/14/24 0319  INR 3.1* 2.2* 1.6* 1.6* 1.7*   Cardiac Enzymes: No results for input(s): CKTOTAL, CKMB, CKMBINDEX, TROPONINI in the last 168 hours. BNP (last 3 results) No results for input(s): PROBNP in the last 8760 hours. HbA1C: No results for input(s): HGBA1C in the last 72 hours. CBG: No results for input(s): GLUCAP in the last 168 hours. Lipid Profile: No results for input(s): CHOL, HDL, LDLCALC, TRIG, CHOLHDL, LDLDIRECT in the last 72 hours. Thyroid  Function Tests: No results for input(s): TSH, T4TOTAL, FREET4, T3FREE, THYROIDAB in the last 72 hours. Anemia Panel: No results for input(s): VITAMINB12, FOLATE, FERRITIN, TIBC, IRON, RETICCTPCT  in the last 72 hours. Sepsis Labs: Recent Labs  Lab 04/08/24 1955  LATICACIDVEN 1.9    Recent Results (from the past 240 hours)  C Difficile Quick Screen w PCR reflex     Status: None   Collection Time: 04/09/24  7:44 AM   Specimen: STOOL  Result Value Ref Range Status   C Diff antigen NEGATIVE NEGATIVE Final   C Diff toxin NEGATIVE NEGATIVE Final   C Diff interpretation No C. difficile detected.  Final    Comment: Performed at [facility], [address] [place], [place] [address]  Gastrointestinal Panel by PCR , Stool     Status: None   Collection Time: 04/09/24  7:44 AM   Specimen: Stool  Result Value Ref Range Status   Campylobacter species NOT DETECTED NOT DETECTED Final   Plesimonas shigelloides NOT DETECTED NOT  DETECTED Final   Salmonella species NOT DETECTED NOT DETECTED Final   Yersinia enterocolitica NOT DETECTED NOT DETECTED Final   Vibrio species NOT DETECTED NOT DETECTED Final   Vibrio cholerae NOT DETECTED NOT DETECTED Final   Enteroaggregative E coli (EAEC) NOT DETECTED NOT DETECTED Final   Enteropathogenic E coli (EPEC) NOT DETECTED NOT DETECTED Final   Enterotoxigenic E coli (ETEC) NOT DETECTED NOT DETECTED Final   Shiga like toxin producing E coli (STEC) NOT DETECTED NOT DETECTED Final   Shigella/Enteroinvasive E coli (EIEC) NOT DETECTED NOT DETECTED Final   Cryptosporidium NOT DETECTED NOT DETECTED Final   Cyclospora cayetanensis NOT DETECTED NOT DETECTED Final   Entamoeba histolytica NOT DETECTED NOT DETECTED Final   Giardia lamblia NOT DETECTED NOT DETECTED Final   Adenovirus F40/41 NOT DETECTED NOT DETECTED Final   Astrovirus NOT DETECTED NOT DETECTED Final   Norovirus GI/GII NOT DETECTED NOT DETECTED Final   Rotavirus A NOT DETECTED NOT DETECTED Final   Sapovirus (I, II, IV, and V) NOT DETECTED NOT DETECTED Final    Comment: Performed at [facility], [address], [place], [place] [address]     Radiology Studies: No results found.   Scheduled Meds:  amLODipine   2.5 mg Oral Daily   atorvastatin   10 mg Oral q1800   feeding supplement  1 Container Oral TID BM   levETIRAcetam   500 mg Oral Daily   levothyroxine   88 mcg Oral Q0600   metoprolol  tartrate  100 mg Oral Daily   And   metoprolol  tartrate  50 mg Oral QHS   Continuous Infusions:     LOS: 5 days   Fredia Skeeter, MD [facility]  04/14/2024, 7:49 AM   *Please note that this is a verbal dictation therefore any spelling or grammatical errors are due to the Dragon Medical One system interpretation.  Please page via Amion and do not message via secure chat for urgent patient care matters. Secure chat can be used for non urgent patient care matters.  How to contact the [facility] Attending or  Consulting provider 7A - 7P or covering provider during after hours 7P -7A, for this patient?  Check the care team in [facility] and look for a) attending/consulting TR[facility] provider listed and b) the [facility] team listed. Page or secure chat 7A-7P. Log into [email or web address] and use [place]'s universal password to access. If you do not have the password, please contact the hospital operator. Locate the [facility] provider you are looking for under [facility] and page to a number that you can be directly reached. If you still have difficulty reaching the provider, please  page the [facility] (Director on Call) for the Hospitalists listed on amion for assistance.

## 2024-04-14 NOTE — Progress Notes (Signed)
 Family at bedside, would like to be facetimed with patients phone when general surgery in to talk with her about a possible upcoming surgery, will pass on to the dayshift RN/NT to please help patient do this as she is not tech savvy, otherwise patient is doing well, no acute distress, denies any pain, and is tolerating her soft diet, Heparin  infusing at 9/hr, no bleeding noted.

## 2024-04-15 ENCOUNTER — Encounter (HOSPITAL_COMMUNITY): Payer: Self-pay | Admitting: Gastroenterology

## 2024-04-15 ENCOUNTER — Inpatient Hospital Stay (HOSPITAL_COMMUNITY)

## 2024-04-15 DIAGNOSIS — Z01810 Encounter for preprocedural cardiovascular examination: Secondary | ICD-10-CM

## 2024-04-15 DIAGNOSIS — K6389 Other specified diseases of intestine: Secondary | ICD-10-CM | POA: Diagnosis not present

## 2024-04-15 DIAGNOSIS — I4891 Unspecified atrial fibrillation: Secondary | ICD-10-CM | POA: Diagnosis not present

## 2024-04-15 DIAGNOSIS — I4821 Permanent atrial fibrillation: Secondary | ICD-10-CM | POA: Diagnosis not present

## 2024-04-15 LAB — PROTIME-INR
INR: 1.4 — ABNORMAL HIGH (ref 0.8–1.2)
Prothrombin Time: 17.7 s — ABNORMAL HIGH (ref 11.4–15.2)

## 2024-04-15 LAB — CBC
HCT: 36.3 % (ref 36.0–46.0)
Hemoglobin: 11.4 g/dL — ABNORMAL LOW (ref 12.0–15.0)
MCH: 29.2 pg (ref 26.0–34.0)
MCHC: 31.4 g/dL (ref 30.0–36.0)
MCV: 93.1 fL (ref 80.0–100.0)
Platelets: 199 K/uL (ref 150–400)
RBC: 3.9 MIL/uL (ref 3.87–5.11)
RDW: 13.6 % (ref 11.5–15.5)
WBC: 7.4 K/uL (ref 4.0–10.5)
nRBC: 0 % (ref 0.0–0.2)

## 2024-04-15 LAB — ECHOCARDIOGRAM COMPLETE
Area-P 1/2: 3.49 cm2
Calc EF: 66.7 %
Height: 62 in
S' Lateral: 2.8 cm
Single Plane A2C EF: 66.6 %
Single Plane A4C EF: 68.6 %
Weight: 2321 [oz_av]

## 2024-04-15 LAB — HEPARIN LEVEL (UNFRACTIONATED)
Heparin Unfractionated: 0.76 [IU]/mL — ABNORMAL HIGH (ref 0.30–0.70)
Heparin Unfractionated: 0.57 [IU]/mL (ref 0.30–0.70)

## 2024-04-15 MED ORDER — POLYETHYLENE GLYCOL 3350 17 GM/SCOOP PO POWD
119.0000 g | Freq: Once | ORAL | Status: AC
  Administered 2024-04-15: 119 g via ORAL
  Filled 2024-04-15: qty 119

## 2024-04-15 MED ORDER — POLYETHYLENE GLYCOL 3350 17 GM/SCOOP PO POWD
119.0000 g | Freq: Once | ORAL | Status: AC
  Administered 2024-04-16: 119 g via ORAL
  Filled 2024-04-15: qty 119

## 2024-04-15 MED ORDER — IOHEXOL 300 MG/ML  SOLN
75.0000 mL | Freq: Once | INTRAMUSCULAR | Status: AC | PRN
  Administered 2024-04-15: 75 mL via INTRAVENOUS

## 2024-04-15 NOTE — Progress Notes (Signed)
   04/15/24 1315  Spiritual Encounters  Type of Visit Initial  Care provided to: Patient  Reason for visit Advance directives  OnCall Visit No  Interventions  Spiritual Care Interventions Made Established relationship of care and support;Compassionate presence;Decision-making support/facilitation  Intervention Outcomes  Outcomes Connection to spiritual care;Awareness around self/spiritual resourses;Connection to values and goals of care  Advance Directives (For Healthcare)  Does Patient Have a Medical Advance Directive? No  Would patient like information on creating a medical advance directive? Yes (Inpatient - patient defers creating a medical advance directive at this time - Information given)    Chaplain responded to spiritual consult AD request. Paperwork and information provided. Pt wishes to name son Melissa Knight as HCPOA. Chaplain will follow up.

## 2024-04-15 NOTE — Progress Notes (Signed)
 PROGRESS NOTE    Melissa Knight  FMW:[id number] DOB: Aug 08, 1933 DOA: 04/08/2024 PCP: [name], FNP   Brief Narrative:  Melissa Knight is a 89 y.o. female with history of permanent atrial fibrillation, hypertension, hypothyroidism, embolic stroke presents to the ER with complaints of abdominal pain with nausea vomiting and diarrhea since 3 days. she has lost at least 50 pounds in the last 1 year.    In the ER CT abdomen pelvis shows features concerning for colonic mass in the transverse colon concerning for colon cancer.  There is also lung nodules in the chest fields concerning for possible metastasis.  Admitted under hospital service, status post colonoscopy which shows near obstructing mass, general surgery consulted.  Assessment & Plan:   Principal Problem:   Colonic mass Active Problems:   Seizure disorder, generalized convulsive, intractable (HCC)   Permanent atrial fibrillation (HCC)   Hypothyroidism (acquired)   Coagulopathy (HCC)   Hypokalemia   History of embolic stroke   Malnutrition of moderate degree   Palliative care encounter   Counseling and coordination of care   Goals of care, counseling/discussion  Colon mass: Concerning for malignancy.  [name] on board.  Status post Colonoscopy 04/13/2024 showed partially obstructing mass with pinpoint opening in the distal transverse/proximal descending colon. Biopsies taken.polyps from sigmoid colon removed with a cold snare.  General surgery consulted per GI recommendations, patient may need partial colectomy.  Cardiology consulted 04/14/2024 for clearance per general surgery request.  History of paroxysmal atrial fibrillation/coagulopathy/supratherapeutic INR: Rate is controlled.  Continue metoprolol .  Continue to hold Coumadin  for upcoming surgery.  Continue heparin  and hold prior to surgery.  Timing per general surgery.  Acute normocytic anemia: Likely secondary to cancer.  Slight drop in hemoglobin down to 9.8, no  indication of transfusion.  Essential hypertension: Controlled, continue amlodipine  and metoprolol .  Holding lisinopril  for now.  History of stroke: Continue statins.  Holding Coumadin .  Hypokalemia: Resolved.  Acquired hypothyroidism: Continue Synthroid .  History of seizure disorder: Continue Keppra .  UTI ruled out: Previous documentation and states that there is possible UTI and patient on empiric antibiotics however patient does not appear to be on any antibiotics.  Patient denies any urinary complaints.  No fever and no leukocytosis.  Doubt UTI.  Asymptomatic bacteriuria ruled in.  DVT prophylaxis: SCDs Start: 04/09/24 0249   Code Status: Full Code  Family Communication:  None present at bedside.  Plan of care discussed with patient in length and he/she verbalized understanding and agreed with it.  Status is: Inpatient Remains inpatient appropriate because: Needs colectomy.   Estimated body mass index is 26.53 kg/m as calculated from the following:   Height as of this encounter: 5' 2 (1.575 m).   Weight as of this encounter: 65.8 kg.    Nutritional Assessment: Body mass index is 26.53 kg/m.SABRA Seen by dietician.  I agree with the assessment and plan as outlined below: Nutrition Status: Nutrition Problem: Moderate Malnutrition Etiology: chronic illness Signs/Symptoms: mild muscle depletion, percent weight loss (13% in 5.5 months) Percent weight loss: 13 % (in 5.5 months) Interventions: Refer to RD note for recommendations, Boost Breeze  . Skin Assessment: I have examined the patient's skin and I agree with the wound assessment as performed by the wound care RN as outlined below:    Consultants:  GI General Surgery  Cardiology Procedures:  As above  Antimicrobials:  Anti-infectives (From admission, onward)    Start     Dose/Rate Route Frequency Ordered Stop   04/09/24 0430  cefTRIAXone  (ROCEPHIN ) 1 g in sodium chloride  0.9 % 100 mL IVPB  Status:  Discontinued         1 g 200 mL/hr over 30 Minutes Intravenous Every 24 hours 04/09/24 0340 04/09/24 1432         Subjective: Patient seen and examined.  She has no complaints at all.  Objective: Vitals:   04/14/24 2122 04/15/24 0409 04/15/24 0800 04/15/24 0932  BP: (!) 157/67 (!) 163/85 (!) 175/108 133/73  Pulse:  61 (!) 58 (!) 57  Resp:  15    Temp:  97.6 F (36.4 C)    TempSrc:      SpO2:  100%  100%  Weight:      Height:        Intake/Output Summary (Last 24 hours) at 04/15/2024 1024 Last data filed at 04/15/2024 0657 Gross per 24 hour  Intake 661.6 ml  Output --  Net 661.6 ml   Filed Weights   04/08/24 1818 04/13/24 1032  Weight: 65.8 kg 65.8 kg    Examination:  General exam: Appears calm and comfortable  Respiratory system: Clear to auscultation. Respiratory effort normal. Cardiovascular system: S1 & S2 heard, RRR. No JVD, murmurs, rubs, gallops or clicks. No pedal edema. Gastrointestinal system: Abdomen is nondistended, soft and nontender. No organomegaly or masses felt. Normal bowel sounds heard. Central nervous system: Alert and oriented. No focal neurological deficits. Extremities: Symmetric 5 x 5 power. Skin: No rashes, lesions or ulcers.  Psychiatry: Judgement and insight appear normal. Mood & affect appropriate.   Data Reviewed: I have personally reviewed following labs and imaging studies  CBC: Recent Labs  Lab 04/10/24 0319 04/11/24 0317 04/12/24 0315 04/14/24 1746 04/15/24 0329  WBC 4.6 8.1 5.1 7.7 7.4  HGB 10.4* 11.8* 9.8* 11.9* 11.4*  HCT 33.7* 37.7 32.2* 40.4 36.3  MCV 93.1 93.3 93.1 95.7 93.1  PLT 166 173 172 201 199   Basic Metabolic Panel: Recent Labs  Lab 04/10/24 0319 04/11/24 0317 04/12/24 0315 04/13/24 0356 04/14/24 0319  NA 137 137 135 137 137  K 3.2* 4.1 3.5 3.9 3.8  CL 105 104 106 108 106  CO2 23 22 23  20* 22  GLUCOSE 76 149* 85 99 120*  BUN 7* <5* <5* <5* <5*  CREATININE 0.52 0.55 0.86 0.84 0.52  CALCIUM  8.0* 8.6* 8.2* 8.6* 8.5*    GFR: Estimated Creatinine Clearance: 42.4 mL/min (by C-G formula based on SCr of 0.52 mg/dL). Liver Function Tests: Recent Labs  Lab 04/08/24 1822 04/09/24 0320  AST 19 14*  ALT 7 9  ALKPHOS 38 27*  BILITOT 0.6 1.0  PROT 6.4* 5.1*  ALBUMIN 3.8 2.7*   Recent Labs  Lab 04/08/24 1822  LIPASE 15   No results for input(s): AMMONIA in the last 168 hours. Coagulation Profile: Recent Labs  Lab 04/12/24 0315 04/12/24 1600 04/13/24 0356 04/14/24 0319 04/15/24 0329  INR 2.2* 1.6* 1.6* 1.7* 1.4*   Cardiac Enzymes: No results for input(s): CKTOTAL, CKMB, CKMBINDEX, TROPONINI in the last 168 hours. BNP (last 3 results) No results for input(s): PROBNP in the last 8760 hours. HbA1C: No results for input(s): HGBA1C in the last 72 hours. CBG: No results for input(s): GLUCAP in the last 168 hours. Lipid Profile: No results for input(s): CHOL, HDL, LDLCALC, TRIG, CHOLHDL, LDLDIRECT in the last 72 hours. Thyroid  Function Tests: No results for input(s): TSH, T4TOTAL, FREET4, T3FREE, THYROIDAB in the last 72 hours. Anemia Panel: No results for input(s): VITAMINB12, FOLATE, FERRITIN, TIBC, IRON, RETICCTPCT  in the last 72 hours. Sepsis Labs: Recent Labs  Lab 04/08/24 1955  LATICACIDVEN 1.9    Recent Results (from the past 240 hours)  C Difficile Quick Screen w PCR reflex     Status: None   Collection Time: 04/09/24  7:44 AM   Specimen: STOOL  Result Value Ref Range Status   C Diff antigen NEGATIVE NEGATIVE Final   C Diff toxin NEGATIVE NEGATIVE Final   C Diff interpretation No C. difficile detected.  Final    Comment: Performed at [facility], [address] [place], [place] [address]  Gastrointestinal Panel by PCR , Stool     Status: None   Collection Time: 04/09/24  7:44 AM   Specimen: Stool  Result Value Ref Range Status   Campylobacter species NOT DETECTED NOT DETECTED Final   Plesimonas  shigelloides NOT DETECTED NOT DETECTED Final   Salmonella species NOT DETECTED NOT DETECTED Final   Yersinia enterocolitica NOT DETECTED NOT DETECTED Final   Vibrio species NOT DETECTED NOT DETECTED Final   Vibrio cholerae NOT DETECTED NOT DETECTED Final   Enteroaggregative E coli (EAEC) NOT DETECTED NOT DETECTED Final   Enteropathogenic E coli (EPEC) NOT DETECTED NOT DETECTED Final   Enterotoxigenic E coli (ETEC) NOT DETECTED NOT DETECTED Final   Shiga like toxin producing E coli (STEC) NOT DETECTED NOT DETECTED Final   Shigella/Enteroinvasive E coli (EIEC) NOT DETECTED NOT DETECTED Final   Cryptosporidium NOT DETECTED NOT DETECTED Final   Cyclospora cayetanensis NOT DETECTED NOT DETECTED Final   Entamoeba histolytica NOT DETECTED NOT DETECTED Final   Giardia lamblia NOT DETECTED NOT DETECTED Final   Adenovirus F40/41 NOT DETECTED NOT DETECTED Final   Astrovirus NOT DETECTED NOT DETECTED Final   Norovirus GI/GII NOT DETECTED NOT DETECTED Final   Rotavirus A NOT DETECTED NOT DETECTED Final   Sapovirus (I, II, IV, and V) NOT DETECTED NOT DETECTED Final    Comment: Performed at [facility], [address] [place], [place] [address]     Radiology Studies: No results found.   Scheduled Meds:  amLODipine   2.5 mg Oral Daily   atorvastatin   10 mg Oral q1800   feeding supplement  1 Container Oral TID BM   levETIRAcetam   500 mg Oral Daily   levothyroxine   88 mcg Oral Q0600   metoprolol  tartrate  100 mg Oral Daily   And   metoprolol  tartrate  50 mg Oral QHS   Continuous Infusions:  heparin  800 Units/hr (04/15/24 0615)      LOS: 6 days   Fredia Skeeter, MD [facility]  04/15/2024, 10:24 AM   *Please note that this is a verbal dictation therefore any spelling or grammatical errors are due to the Dragon Medical One system interpretation.  Please page via Amion and do not message via secure chat for urgent patient care matters. Secure chat can be used for non  urgent patient care matters.  How to contact the [facility] Attending or Consulting provider 7A - 7P or covering provider during after hours 7P -7A, for this patient?  Check the care team in [facility] and look for a) attending/consulting TRH provider listed and b) the [facility] team listed. Page or secure chat 7A-7P. Log into [email or web address] and use [place]'s universal password to access. If you do not have the password, please contact the hospital operator. Locate the [facility] provider you are looking for under [facility] and page to a number that you can be directly reached. If you  still have difficulty reaching the provider, please page the [facility] (Director on Call) for the Hospitalists listed on amion for assistance.

## 2024-04-15 NOTE — Plan of Care (Signed)

## 2024-04-15 NOTE — Progress Notes (Signed)
 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  Await cardiology assessment.  Will start on CLD today and begin gentle bowel prep - hopefully can avoid colostomy.  Pathology pending from colonoscopy.        Krystal Spinner, MD [facility] practice Office: [phone]        Chief Complaint: Near-obstruction colonic mass  Subjective: Patient in bed, comfortable, eating regular diet for breakfast.  Objective: Vital signs in last 24 hours: Temp:  [97.6 F (36.4 C)-98.5 F (36.9 C)] 97.6 F (36.4 C) (08/04 0409) Pulse Rate:  [54-62] 56 (08/04 0800) Resp:  [15-16] 15 (08/04 0409) BP: (132-175)/(64-108) 175/108 (08/04 0800) SpO2:  [98 %-100 %] 100 % (08/04 0409) Last BM Date : 04/14/24  Intake/Output from previous day: 08/03 0701 - 08/04 0700 In: 781.6 [P.O.:600; I.V.:181.6] Out: -  Intake/Output this shift: No intake/output data recorded.  Physical Exam: HEENT - sclerae clear, mucous membranes moist Neck - soft Abdomen - mild distension, non-tender, non mass  Lab Results:  Recent Labs    04/14/24 1746 04/15/24 0329  WBC 7.7 7.4  HGB 11.9* 11.4*  HCT 40.4 36.3  PLT 201 199   BMET Recent Labs    04/13/24 0356 04/14/24 0319  NA 137 137  K 3.9 3.8  CL 108 106  CO2 20* 22  GLUCOSE 99 120*  BUN <5* <5*  CREATININE 0.84 0.52  CALCIUM  8.6* 8.5*   PT/INR Recent Labs    04/14/24 0319 04/15/24 0329  LABPROT 20.9* 17.7*  INR 1.7* 1.4*   Comprehensive Metabolic Panel:    Component Value Date/Time   NA 137 04/14/2024 0319   NA 137 04/13/2024 0356   NA 144 05/08/2023 1218   NA 141 10/11/2022 1154   K 3.8 04/14/2024 0319   K 3.9 04/13/2024 0356    CL 106 04/14/2024 0319   CL 108 04/13/2024 0356   CO2 22 04/14/2024 0319   CO2 20 (L) 04/13/2024 0356   BUN <5 (L) 04/14/2024 0319   BUN <5 (L) 04/13/2024 0356   BUN 12 05/08/2023 1218   BUN 11 10/11/2022 1154   CREATININE 0.52 04/14/2024 0319   CREATININE 0.84 04/13/2024 0356   CREATININE 0.69 01/30/2013 1117   GLUCOSE 120 (H) 04/14/2024 0319   GLUCOSE 99 04/13/2024 0356   CALCIUM  8.5 (L) 04/14/2024 0319   CALCIUM  8.6 (L) 04/13/2024 0356   AST 14 (L) 04/09/2024 0320   AST 19 04/08/2024 1822   ALT 9 04/09/2024 0320   ALT 7 04/08/2024 1822   ALKPHOS 27 (L) 04/09/2024 0320   ALKPHOS 38 04/08/2024 1822   BILITOT 1.0 04/09/2024 0320   BILITOT 0.6 04/08/2024 1822   BILITOT 0.5 05/08/2023 1218   BILITOT 0.4 10/11/2022 1154   PROT 5.1 (L) 04/09/2024 0320   PROT 6.4 (L) 04/08/2024 1822   PROT 5.8 (L) 05/08/2023 1218   PROT 6.1 10/11/2022 1154   ALBUMIN 2.7 (L) 04/09/2024 0320   ALBUMIN 3.8 04/08/2024 1822   ALBUMIN 3.8 05/08/2023 1218   ALBUMIN 3.9 10/11/2022 1154    Studies/Results: No results found.    Krystal Spinner 04/15/2024  Patient ID: Melissa Knight, female   DOB: 1932/10/25, 89 y.o.  MRN: [id number]

## 2024-04-15 NOTE — Progress Notes (Signed)
 PHARMACY - ANTICOAGULATION CONSULT NOTE  Pharmacy Consult for heparin   Indication: hx atrial fibrillation and stroke (PTA warfarin on hold)  Allergies  Allergen Reactions   Sulfa Antibiotics Rash    rash    Patient Measurements: Height: 5' 2 (157.5 cm) Weight: 65.8 kg (145 lb 1 oz) IBW/kg (Calculated) : 50.1 HEPARIN  DW (KG): 63.6  Vital Signs: Temp: 97.6 F (36.4 C) (08/04 0409) Temp Source: Oral (08/03 2109) BP: 163/85 (08/04 0409) Pulse Rate: 61 (08/04 0409)  Labs: Recent Labs    04/13/24 0356 04/14/24 0319 04/14/24 1746 04/15/24 0329  HGB  --   --  11.9* 11.4*  HCT  --   --  40.4 36.3  PLT  --   --  201 199  LABPROT 19.4* 20.9*  --  17.7*  INR 1.6* 1.7*  --  1.4*  HEPARINUNFRC  --   --  0.46 0.76*  CREATININE 0.84 0.52  --   --     Estimated Creatinine Clearance: 42.4 mL/min (by C-G formula based on SCr of 0.52 mg/dL).   Medical History: Past Medical History:  Diagnosis Date   Acute ischemic stroke (HCC) 06/12/2012   acute right parietal ischemic stroke - likely cardioembolic in setting of AFib => coumadin  started and followed by PCP   Atrial fibrillation (HCC) 06/12/2012   Echocardiogram 06/30/12: EF 60-65%, mild MR, PASP 33.   Coagulopathy (HCC) 04/09/2024   Hyperlipidemia    Hypertension    Hypothyroidism    Lichen sclerosus et atrophicus of the vulva    PONV (postoperative nausea and vomiting)    Seizure (HCC) 06/12/2012   in the setting of acute ischemic stroke    Medications:  - PTA warfarin regimen: 3.75 mg daily per office note on 03/25/24 (last dose taken on 7/26)   Assessment: Patient is an 89 y.o F with hx CVA and afib on warfarin PTA who presented to the ED on 04/08/24 with c/o abdominal pain and n/v/d.  Abdominal/pelvis CT on 04/08/24 showed colonic mass and lung nodules. Warfarin placed on hold on admission and INR reversed with vit K for colonoscopy procedure. She underwent colonoscopy on 8/2 and was noted to have a partially obstructing  large mass in the distal transverse colon/ proximal descending colon with concern for malignancy.  Pharmacy has been consulted on 04/14/24 to bridge with heparin  drip in case surgery is needed for colon mass.   Significant events:  - 7/29: vit K 2.5 mg PO x1 - 7/30: vit K 10mg  PO x1 in AM  and 5mg  PO x1 in PM - 7/31: vit K 10 mg PO x1 - 8/2: colonoscopy   Today, 04/15/2024: - INR 1.4 - Heparin  level = 0.76 (supratherapeutic) on heparin  gtt @ 900 units/hr - hgb 11.4 and plts 199 - No bleeding complication reported   Goal of Therapy:  Heparin  level 0.3-0.7 units/ml Monitor platelets by anticoagulation protocol: Yes   Plan:  - Decrease Heparin  drip to 800 units/hr - check 8 hr heparin  level after rate reduced - monitor for s/sx bleeding   Xxavier Noon, Arvin Fletcher, PharmD 04/15/2024,5:08 AM

## 2024-04-15 NOTE — Progress Notes (Signed)
 PHARMACY - ANTICOAGULATION CONSULT NOTE  Pharmacy Consult for heparin   Indication: hx atrial fibrillation and stroke (PTA warfarin on hold)  Allergies  Allergen Reactions   Sulfa Antibiotics Rash    rash    Patient Measurements: Height: 5' 2 (157.5 cm) Weight: 65.8 kg (145 lb 1 oz) IBW/kg (Calculated) : 50.1 HEPARIN  DW (KG): 63.6  Vital Signs: Temp: 97.8 F (36.6 C) (08/04 1313) BP: 135/67 (08/04 1313) Pulse Rate: 55 (08/04 1313)  Labs: Recent Labs    04/13/24 0356 04/14/24 0319 04/14/24 1746 04/15/24 0329 04/15/24 1432  HGB  --   --  11.9* 11.4*  --   HCT  --   --  40.4 36.3  --   PLT  --   --  201 199  --   LABPROT 19.4* 20.9*  --  17.7*  --   INR 1.6* 1.7*  --  1.4*  --   HEPARINUNFRC  --   --  0.46 0.76* 0.57  CREATININE 0.84 0.52  --   --   --     Estimated Creatinine Clearance: 42.4 mL/min (by C-G formula based on SCr of 0.52 mg/dL).   Medications:  - PTA warfarin regimen: 3.75 mg daily per office note on 03/25/24 (last dose taken on 7/26)   Assessment: Patient is an 89 y.o F with hx CVA and afib on warfarin PTA who presented to the ED on 04/08/24 with c/o abdominal pain and n/v/d.  Abdominal/pelvis CT on 04/08/24 showed colonic mass and lung nodules. Warfarin placed on hold on admission and INR reversed with vit K for colonoscopy procedure. She underwent colonoscopy on 8/2 and was noted to have a partially obstructing large mass in the distal transverse colon/ proximal descending colon with concern for malignancy.  Pharmacy has been consulted on 04/14/24 to bridge with heparin  drip in case surgery is needed for colon mass.   Significant events:  - 7/29: vit K 2.5 mg PO x1 - 7/30: vit K 10mg  PO x1 in AM  and 5mg  PO x1 in PM - 7/31: vit K 10 mg PO x1 - 8/2: colonoscopy   Today, 04/15/2024: - Most recent heparin  level now therapeutic after reducing heparin  infusion to 800 units/hr - Hgb slightly low but stable; Plt stable WNL - No bleeding complication  reported   Goal of Therapy:  Heparin  level 0.3-0.7 units/ml Monitor platelets by anticoagulation protocol: Yes   Plan:  - Continue heparin  infusion at 800 units/hr - Check confirmatory heparin  level with AM labs - daily heparin  level and CBC - monitor for s/sx bleeding  - F/u for ability to transition back to warfarin  Cap Massi A, PharmD 04/15/2024,4:37 PM

## 2024-04-15 NOTE — Progress Notes (Signed)
 Chaplain attempted follow up visit, however patient Melissa Knight was on the phone with her son at the time. She is aware that chaplains remain available if and whenever needed.

## 2024-04-15 NOTE — Progress Notes (Signed)
 Daily Progress Note   Patient Name: Melissa Knight       Date: 04/15/2024 DOB: Feb 01, 1933  Age: 89 y.o. MRN#: [id number] Attending Physician: Vernon Ranks, MD Primary Care Physician: [name], FNP Admit Date: 04/08/2024  Reason for Consultation/Follow-up: Establishing goals of care  Subjective: Awake alert Sitting in chair Did not sleep well last night, denies pain.   Length of Stay: 6  Current Medications: Scheduled Meds:   amLODipine   2.5 mg Oral Daily   atorvastatin   10 mg Oral q1800   feeding supplement  1 Container Oral TID BM   levETIRAcetam   500 mg Oral Daily   levothyroxine   88 mcg Oral Q0600   metoprolol  tartrate  100 mg Oral Daily   And   metoprolol  tartrate  50 mg Oral QHS    Continuous Infusions:  heparin  800 Units/hr (04/15/24 0615)    PRN Meds: acetaminophen  **OR** acetaminophen , ondansetron  **OR** ondansetron  (ZOFRAN ) IV, mouth rinse, prochlorperazine   Physical Exam         Awake alert Resting in chair No distress Regular work of breathing  Vital Signs: BP 135/67 (BP Location: Right Arm)   Pulse (!) 55   Temp 97.8 F (36.6 C)   Resp 15   Ht 5' 2 (1.575 m)   Wt 65.8 kg   SpO2 99%   BMI 26.53 kg/m  SpO2: SpO2: 99 % O2 Device: O2 Device: Room Air O2 Flow Rate:    Intake/output summary:  Intake/Output Summary (Last 24 hours) at 04/15/2024 1317 Last data filed at 04/15/2024 1030 Gross per 24 hour  Intake 781.6 ml  Output --  Net 781.6 ml   LBM: Last BM Date : 04/14/24 Baseline Weight: Weight: 65.8 kg Most recent weight: Weight: 65.8 kg       Palliative Assessment/Data:      Patient Active Problem List   Diagnosis Date Noted   Palliative care encounter 04/14/2024   Counseling and coordination of care 04/14/2024   Goals of  care, counseling/discussion 04/14/2024   Malnutrition of moderate degree 04/11/2024   Coagulopathy (HCC) 04/09/2024   Hypokalemia 04/09/2024   History of embolic stroke 04/09/2024   Colonic mass 04/08/2024   Morbid obesity (HCC) 10/05/2016   Lichen sclerosus of female genitalia 10/10/2014   Stress incontinence 10/10/2014   Hypothyroidism (acquired) 06/05/2014   Metabolic syndrome  01/01/2014   Hyperlipidemia 02/05/2013   Permanent atrial fibrillation (HCC) 07/02/2012   Late effect of cerebrovascular accident 06/30/2012   Seizure disorder, generalized convulsive, intractable (HCC) 06/30/2012   Essential hypertension 06/30/2012    Palliative Care Assessment & Plan   Patient Profile:    Assessment:  Patient Profile: Melissa Knight is a 89 y.o. female with a hx of permanent A-fib, hypertension, hyperlipidemia, hypothyroidism, history of CVA 06/2012 and history of seizure. near-obstruction colonic mass - colonoscopy with near-obstructing distal transverse mass, biopsies pending  Cardiology consult for pre op clearance has been requested.   Recommendations/Plan:  Full code full scope care, amenable to surgical interventions if offered.  Palliative care to continue to follow along.  Social and Psycho social components of care: Patient lives by herself, her husband got COVID and PNA a few months ago, which has exacerbated his mild cognitive impairment and he is now in SNF.  She has 3 sons, 2 live out of state, son Cathlyn lives in [place], [place].   Goals of Care and Additional Recommendations: Limitations on Scope of Treatment: Full Scope Treatment  Code Status:    Code Status Orders  (From admission, onward)           Start     Ordered   04/09/24 0249  Full code  Continuous       Question:  By:  Answer:  Consent: discussion documented in EHR   04/09/24 0249           Code Status History     This patient has a current code status but no historical code status.        Prognosis:  Unable to determine  Discharge Planning: To Be Determined  Care plan was discussed with patient.   Thank you for allowing the Palliative Medicine Team to assist in the care of this patient. Mod MDM.      Greater than 50%  of this time was spent counseling and coordinating care related to the above assessment and plan.  Lonia Serve, MD  Please contact Palliative Medicine Team phone at [phone] for questions and concerns.

## 2024-04-15 NOTE — Progress Notes (Signed)
   Echo overall reassuring with normal LV function -no further preoperative cardiac work-up at this time.  Will follow-up after surgery if she proceed with that.  Vinie KYM Maxcy, MD, [facility], FNLA, FACP  [place]  [facility]  Medical Director of the [facility] Lipid [facility] &  Cardiovascular Risk Reduction Clinic Diplomate of the American Board of Clinical Lipidology Attending Cardiologist  Direct Dial: [phone]  Fax: [phone]  Website:  [email or web address]

## 2024-04-15 NOTE — Consult Note (Signed)
 Cardiology Consultation   Patient ID: Melissa Knight MRN: [id number]; DOB: Sep 14, 1932  Admit date: 04/08/2024 Date of Consult: 04/15/2024  PCP:  [name], FNP   [facility] Providers Cardiologist:  New - seen remotely by Dr. Wonda and Glendia Ferrier     Patient Profile: Melissa Knight is a 89 y.o. female with a hx of permanent A-fib, hypertension, hyperlipidemia, hypothyroidism, history of CVA 06/2012 and history of seizure who is being seen 04/15/2024 for the evaluation of preop clearance at the request of Dr. Eletha.  History of Present Illness: Melissa Knight is a very pleasant 89 year old female with past medical history of permanent A-fib, hypertension, hyperlipidemia, hypothyroidism, history of CVA 06/2012 and history of seizure.  She has significant family history of CAD with father having heart attack at age 53 and a brother and sister had a heart attack in her 50s.  She herself fortunately has never been diagnosed with CAD nor has she ever had a heart attack.  She used to be followed by Dr. Wonda and Glendia Ferrier.  Echocardiogram in October 2013 showed EF 60 to 65%, mild MR, PASP 33.  Carotid Doppler was negative for significant carotid artery disease.  Her stroke was felt to be cardioembolic, she was anticoagulated with Xarelto  but transition to warfarin later due to cost.  Her warfarin was managed by PCP.  Last cardiology visit was in January 2018, she has been lost to follow-up since.  She says she lives with her son.  She does not do much strenuous activity.  She is fairly independent and does not require much care.  She occasionally goes to the supermarket, however rely on a cart to keep her balance.  She denies any exertional chest pain or shortness of breath.  She has lost 50 pounds in the past year.  She presented to [facility] on 04/08/2024 due to intermittent abdominal pain, nausea and vomiting.  CT of the abdomen showed a mass in the mid transverse colon  likely represent colon cancer, multiple pulmonary nodules, largest measuring 4 mm, metastasis could not be excluded.  GI service was consulted and she underwent a colonoscopy on 04/14/2024 after holding Coumadin , this revealed a likely partially obstructing malignant tumor in the distal transverse colon.  The lesion was biopsied.  General surgery was consulted for the partially obstructive colonic mass.  Cardiology service consulted for preop clearance.   Past Medical History:  Diagnosis Date   Acute ischemic stroke (HCC) 06/12/2012   acute right parietal ischemic stroke - likely cardioembolic in setting of AFib => coumadin  started and followed by PCP   Atrial fibrillation (HCC) 06/12/2012   Echocardiogram 06/30/12: EF 60-65%, mild MR, PASP 33.   Coagulopathy (HCC) 04/09/2024   Hyperlipidemia    Hypertension    Hypothyroidism    Lichen sclerosus et atrophicus of the vulva    PONV (postoperative nausea and vomiting)    Seizure (HCC) 06/12/2012   in the setting of acute ischemic stroke    Past Surgical History:  Procedure Laterality Date   BLADDER SURGERY     bladder prolapse   CATARACT EXTRACTION Left 07/2023   CATARACT EXTRACTION W/PHACO Left 08/04/2023   Procedure: CATARACT EXTRACTION PHACO AND INTRAOCULAR LENS PLACEMENT (IOC);  Surgeon: Juli Blunt, MD;  Location: [facility];  Service: Ophthalmology;  Laterality: Left;  CDE: 6.75   COLONOSCOPY N/A 04/13/2024   Procedure: COLONOSCOPY;  Surgeon: Elicia Claw, MD;  Location: [facility];  Service: Gastroenterology;  Laterality: N/A;   TUBAL LIGATION  Home Medications:  Prior to Admission medications   Medication Sig Start Date End Date Taking? Authorizing Provider  amLODipine  (NORVASC ) 2.5 MG tablet Take 1 tablet (2.5 mg total) by mouth daily. 10/27/23  Yes Gladis, Mary-Margaret, FNP  atorvastatin  (LIPITOR) 10 MG tablet Take 1 tablet (10 mg total) by mouth daily at 6 PM. 10/27/23  Yes Gladis, Mary-Margaret, FNP  betamethasone   dipropionate 0.05 % cream Apply daily as needed to affected area 06/11/20  Yes Gladis, Mary-Margaret, FNP  calcium -vitamin D (OSCAL WITH D) 500-200 MG-UNIT tablet Take 1 tablet by mouth.   Yes [provider]  folic acid  (FOLVITE ) 1 MG tablet TAKE 1 TABLET BY MOUTH EVERY DAY 04/02/24  Yes Gladis, Mary-Margaret, FNP  furosemide  (LASIX ) 20 MG tablet TAKE 1 TABLET EVERY DAY AS NEEDED 10/27/23  Yes Gladis, Mary-Margaret, FNP  levETIRAcetam  (KEPPRA ) 500 MG tablet Take 1 tablet (500 mg total) by mouth daily. 10/27/23  Yes Martin, Mary-Margaret, FNP  levothyroxine  (SYNTHROID ) 88 MCG tablet Take 1 tablet (88 mcg total) by mouth daily. 03/19/24  Yes Martin, Mary-Margaret, FNP  lisinopril  (ZESTRIL ) 20 MG tablet TAKE 1 TABLET BY MOUTH TWICE A DAY 02/23/24  Yes Gladis, Mary-Margaret, FNP  metoprolol  tartrate (LOPRESSOR ) 50 MG tablet TAKE 2 TABLETS BY MOUTH IN MORNING AND 1 TABLET BY MOUTH EVERY NIGHT AT BEDTIME 10/27/23  Yes Martin, Mary-Margaret, FNP  TURMERIC PO Take 1 Dose by mouth daily.   Yes [provider]  warfarin (COUMADIN ) 7.5 MG tablet Take 1 tablet (7.5 mg total) by mouth one time only at 6 PM. Patient taking differently: Take 3.75 mg by mouth daily. 10/27/23  Yes Gladis, Mary-Margaret, FNP    Scheduled Meds:  amLODipine   2.5 mg Oral Daily   atorvastatin   10 mg Oral q1800   feeding supplement  1 Container Oral TID BM   levETIRAcetam   500 mg Oral Daily   levothyroxine   88 mcg Oral Q0600   metoprolol  tartrate  100 mg Oral Daily   And   metoprolol  tartrate  50 mg Oral QHS   Continuous Infusions:  heparin  800 Units/hr (04/15/24 0615)   PRN Meds: acetaminophen  **OR** acetaminophen , ondansetron  **OR** ondansetron  (ZOFRAN ) IV, mouth rinse, prochlorperazine   Allergies:    Allergies  Allergen Reactions   Sulfa Antibiotics Rash    rash    Social History:   Social History   Socioeconomic History   Marital status: Married    Spouse name: Not on file   Number of children: 3    Years of education: 13   Highest education level: Some college, no degree  Occupational History   Occupation: retired    Comment: Clinical biochemist  Tobacco Use   Smoking status: Former    Current packs/day: 0.00    Types: Cigarettes    Quit date: 09/30/1978    Years since quitting: 45.5   Smokeless tobacco: Never  Vaping Use   Vaping status: Never Used  Substance and Sexual Activity   Alcohol use: No   Drug use: No   Sexual activity: Not on file  Other Topics Concern   Not on file  Social History Narrative   Not on file   Social Drivers of Health   Financial Resource Strain: Low Risk  (10/26/2023)   Overall Financial Resource Strain (CARDIA)    Difficulty of Paying Living Expenses: Not hard at all  Food Insecurity: No Food Insecurity (04/09/2024)   Hunger Vital Sign    Worried About Running Out of Food in the Last Year: Never  true    Ran Out of Food in the Last Year: Never true  Transportation Needs: No Transportation Needs (04/09/2024)   PRAPARE - Administrator, Civil Service (Medical): No    Lack of Transportation (Non-Medical): No  Physical Activity: Inactive (10/26/2023)   Exercise Vital Sign    Days of Exercise per Week: 0 days    Minutes of Exercise per Session: 0 min  Stress: No Stress Concern Present (10/26/2023)   [employer] of Occupational Health - Occupational Stress Questionnaire    Feeling of Stress : Only a little  Social Connections: Socially Integrated (04/09/2024)   Social Connection and Isolation Panel    Frequency of Communication with Friends and Family: Twice a week    Frequency of Social Gatherings with Friends and Family: More than three times a week    Attends Religious Services: More than 4 times per year    Active Member of [employer] or Organizations: Yes    Attends Banker Meetings: Never    Marital Status: Married  Catering manager Violence: Not At Risk (04/09/2024)   Humiliation, Afraid, Rape, and Kick questionnaire     Fear of Current or Ex-Partner: No    Emotionally Abused: No    Physically Abused: No    Sexually Abused: No    Family History:    Family History  Problem Relation Age of Onset   Atrial fibrillation Mother        pacemaker   Hypertension Mother    Heart disease Sister        MI in 63s   Stroke Sister    Heart disease Father    Heart disease Sister    Heart disease Sister    Hypertension Sister    Anxiety disorder Sister    Heart disease Brother    Cancer Brother        pancreatic   Heart disease Son    Stroke Son 50     ROS:  Please see the history of present illness.   All other ROS reviewed and negative.     Physical Exam/Data: Vitals:   04/14/24 2122 04/15/24 0409 04/15/24 0800 04/15/24 0932  BP: (!) 157/67 (!) 163/85 (!) 175/108 133/73  Pulse:  61 (!) 58 (!) 57  Resp:  15    Temp:  97.6 F (36.4 C)    TempSrc:      SpO2:  100%  100%  Weight:      Height:        Intake/Output Summary (Last 24 hours) at 04/15/2024 1203 Last data filed at 04/15/2024 1030 Gross per 24 hour  Intake 781.6 ml  Output --  Net 781.6 ml      04/13/2024   10:32 AM 04/08/2024    6:18 PM 03/25/2024   12:27 PM  Last 3 Weights  Weight (lbs) 145 lb 1 oz 145 lb 156 lb  Weight (kg) 65.8 kg 65.772 kg 70.761 kg     Body mass index is 26.53 kg/m.  General:  Well nourished, well developed, in no acute distress HEENT: normal Neck: no JVD Vascular: No carotid bruits; Distal pulses 2+ bilaterally Cardiac:  normal S1, S2; RRR; no murmur  Lungs:  clear to auscultation bilaterally, no wheezing, rhonchi or rales  Abd: soft, nontender, no hepatomegaly  Ext: no edema Musculoskeletal:  No deformities, BUE and BLE strength normal and equal Skin: warm and dry  Neuro:  CNs 2-12 intact, no focal abnormalities noted Psych:  Normal affect  EKG:  The EKG was personally reviewed and demonstrates: Rate controlled atrial fibrillation Telemetry:  Telemetry was personally reviewed and demonstrates:  Not on telemetry  Relevant CV Studies:  Echo 06/30/2012 LV EF: 60% -   65%   Study Conclusions   - Left ventricle: The cavity size was below normal. Wall    thickness was normal. Systolic function was normal. The    estimated ejection fraction was in the range of 60% to    65%.  - Mitral valve: Mild regurgitation.  - Right ventricle: The cavity size was mildly dilated.  - Pulmonary arteries: PA peak pressure: 33mm Hg (S).   Laboratory Data: High Sensitivity Troponin:  No results for input(s): TROPONINIHS in the last 720 hours.   Chemistry Recent Labs  Lab 04/12/24 0315 04/13/24 0356 04/14/24 0319  NA 135 137 137  K 3.5 3.9 3.8  CL 106 108 106  CO2 23 20* 22  GLUCOSE 85 99 120*  BUN <5* <5* <5*  CREATININE 0.86 0.84 0.52  CALCIUM  8.2* 8.6* 8.5*  GFRNONAA >60 >60 >60  ANIONGAP 6 9 9     Recent Labs  Lab 04/08/24 1822 04/09/24 0320  PROT 6.4* 5.1*  ALBUMIN 3.8 2.7*  AST 19 14*  ALT 7 9  ALKPHOS 38 27*  BILITOT 0.6 1.0   Lipids No results for input(s): CHOL, TRIG, HDL, LABVLDL, LDLCALC, CHOLHDL in the last 168 hours.  Hematology Recent Labs  Lab 04/12/24 0315 04/14/24 1746 04/15/24 0329  WBC 5.1 7.7 7.4  RBC 3.46* 4.22 3.90  HGB 9.8* 11.9* 11.4*  HCT 32.2* 40.4 36.3  MCV 93.1 95.7 93.1  MCH 28.3 28.2 29.2  MCHC 30.4 29.5* 31.4  RDW 13.7 13.6 13.6  PLT 172 201 199   Thyroid  No results for input(s): TSH, FREET4 in the last 168 hours.  BNPNo results for input(s): BNP, PROBNP in the last 168 hours.  DDimer No results for input(s): DDIMER in the last 168 hours.  Radiology/Studies:  CT CHEST W CONTRAST Result Date: 04/15/2024 CLINICAL DATA:  Colon cancer, assess treatment response. * Tracking Code: BO * EXAM: CT CHEST WITH CONTRAST TECHNIQUE: Multidetector CT imaging of the chest was performed during intravenous contrast administration. RADIATION DOSE REDUCTION: This exam was performed according to the departmental dose-optimization  program which includes automated exposure control, adjustment of the mA and/or kV according to patient size and/or use of iterative reconstruction technique. CONTRAST:  75mL OMNIPAQUE  IOHEXOL  300 MG/ML  SOLN COMPARISON:  None Available. FINDINGS: Cardiovascular: Normal cardiac size. No pericardial effusion. No aortic aneurysm. There are coronary artery calcifications, in keeping with coronary artery disease. There are also moderate peripheral atherosclerotic vascular calcifications of thoracic aorta and its major branches. Mediastinum/Nodes: Visualized thyroid  gland appears grossly unremarkable. No solid / cystic mediastinal masses. The esophagus is nondistended precluding optimal assessment. There are multiple partially calcified mediastinal and right hilar lymph nodes, which are nonspecific but commonly seen as a sequela of prior granulomatous infection. No axillary or left hilar lymphadenopathy by size criteria. Lungs/Pleura: The central tracheo-bronchial tree is patent. There multiple calcified granulomas throughout bilateral lungs with largest in the right upper lobe measuring up to 6 mm. No suspicious lung nodule. There is trace left pleural effusion, which is new since the prior study from 04/08/2024. no right pleural effusion. No mass, consolidation or pneumothorax. Upper Abdomen: Multiple calcified granulomas noted in the spleen and liver. There is an additional subcentimeter hypoattenuating structure in the right hepatic lobe, which is too small to adequately  characterize. Small diverticulum noted arising from the second part of duodenum. Remaining visualized upper abdominal viscera within normal limits. Musculoskeletal: The visualized soft tissues of the chest wall are grossly unremarkable. No suspicious osseous lesions. There are mild to moderate multilevel degenerative changes in the visualized spine. IMPRESSION: 1. No metastatic disease identified within the chest. 2. Multiple other nonacute  observations, as described above. Aortic Atherosclerosis (ICD10-I70.0). Electronically Signed   By: Ree Molt M.D.   On: 04/15/2024 12:00     Assessment and Plan: Preoperative clearance:  - Seen by Dr. Eletha of general surgery service for the partially obstructing transverse colon mass.  - She does not have prior history of CAD, she denies any recent exertional chest pain or shortness of breath.  She is in permanent A-fib.  Coumadin  held, patient is on IV heparin .  - She is unable to achieve more than 4 METS of activity, will obtain echocardiogram.  Given lack of any anginal symptom or history of CAD, if echocardiogram is normal, may not need stress test prior to surgery.  Will discuss with MD.  Partially obstructing transverse colon mass: Reported 50 pound weight loss over the past year.  Presented with abdominal discomfort, nausea and vomiting.  CT noted partially obstructing transverse colon mass.  This is also confirmed on colonoscopy.  CT image also showed multiple pulmonary nodules, largest measuring 4 mm.  Seen by general surgery service, oncology service also consulted.  Permanent A-fib: Last seen by cardiology service in the office in January 2018, has been lost for follow-up.  On warfarin managed by PCP.  Heart rate very well-controlled on arrival to the hospital.  Hypertension: Blood pressure well-controlled  Hyperlipidemia: On atorvastatin   History of CVA 06/2012: Cardioembolic in the setting of A-fib.  History of seizure: No recent recurrence.   Risk Assessment/Risk Scores:         CHA2DS2-VASc Score = 6   This indicates a 9.7% annual risk of stroke. The patient's score is based upon: CHF History: 0 HTN History: 1 Diabetes History: 0 Stroke History: 2 Vascular Disease History: 0 Age Score: 2 Gender Score: 1        For questions or updates, please contact [facility] Please consult [email or web address] for contact info under    Signed, Mehkai Gallo, [place]   04/15/2024 12:03 PM

## 2024-04-16 DIAGNOSIS — K6389 Other specified diseases of intestine: Secondary | ICD-10-CM | POA: Diagnosis not present

## 2024-04-16 LAB — PROTIME-INR
INR: 1.2 (ref 0.8–1.2)
Prothrombin Time: 15.7 s — ABNORMAL HIGH (ref 11.4–15.2)

## 2024-04-16 LAB — CBC
HCT: 36.8 % (ref 36.0–46.0)
Hemoglobin: 11 g/dL — ABNORMAL LOW (ref 12.0–15.0)
MCH: 28.3 pg (ref 26.0–34.0)
MCHC: 29.9 g/dL — ABNORMAL LOW (ref 30.0–36.0)
MCV: 94.6 fL (ref 80.0–100.0)
Platelets: 186 K/uL (ref 150–400)
RBC: 3.89 MIL/uL (ref 3.87–5.11)
RDW: 14 % (ref 11.5–15.5)
WBC: 11.7 K/uL — ABNORMAL HIGH (ref 4.0–10.5)
nRBC: 0 % (ref 0.0–0.2)

## 2024-04-16 LAB — HEPARIN LEVEL (UNFRACTIONATED)
Heparin Unfractionated: 0.72 [IU]/mL — ABNORMAL HIGH (ref 0.30–0.70)
Heparin Unfractionated: 0.63 [IU]/mL (ref 0.30–0.70)

## 2024-04-16 LAB — CEA: CEA: 6.5 ng/mL — ABNORMAL HIGH (ref 0.0–4.7)

## 2024-04-16 MED ORDER — CHLORHEXIDINE GLUCONATE CLOTH 2 % EX PADS
6.0000 | MEDICATED_PAD | Freq: Once | CUTANEOUS | Status: AC
  Administered 2024-04-16: 6 via TOPICAL

## 2024-04-16 MED ORDER — NEOMYCIN SULFATE 500 MG PO TABS
1000.0000 mg | ORAL_TABLET | Freq: Once | ORAL | Status: AC
  Administered 2024-04-16: 1000 mg via ORAL
  Filled 2024-04-16: qty 2

## 2024-04-16 MED ORDER — ENSURE PRE-SURGERY PO LIQD: 592.0000 mL | Freq: Once | ORAL | Status: DC

## 2024-04-16 MED ORDER — METRONIDAZOLE 500 MG PO TABS: 1000.0000 mg | ORAL_TABLET | ORAL | Status: DC

## 2024-04-16 MED ORDER — ALVIMOPAN 12 MG PO CAPS
12.0000 mg | ORAL_CAPSULE | ORAL | Status: AC
  Administered 2024-04-17: 12 mg via ORAL
  Filled 2024-04-16 (×2): qty 1

## 2024-04-16 MED ORDER — ACETAMINOPHEN 500 MG PO TABS
1000.0000 mg | ORAL_TABLET | ORAL | Status: AC
  Administered 2024-04-17: 1000 mg via ORAL
  Filled 2024-04-16: qty 2

## 2024-04-16 MED ORDER — ENSURE PRE-SURGERY PO LIQD: 296.0000 mL | Freq: Once | ORAL | Status: DC

## 2024-04-16 MED ORDER — NEOMYCIN SULFATE 500 MG PO TABS: 1000.0000 mg | ORAL_TABLET | ORAL | Status: DC

## 2024-04-16 MED ORDER — SODIUM CHLORIDE 0.9 % IV SOLN
2.0000 g | INTRAVENOUS | Status: AC
  Administered 2024-04-17: 2 g via INTRAVENOUS
  Filled 2024-04-16: qty 2

## 2024-04-16 MED ORDER — METRONIDAZOLE 500 MG PO TABS
1000.0000 mg | ORAL_TABLET | Freq: Once | ORAL | Status: AC
  Administered 2024-04-16: 1000 mg via ORAL
  Filled 2024-04-16: qty 2

## 2024-04-16 MED ORDER — ENSURE PRE-SURGERY PO LIQD
592.0000 mL | Freq: Once | ORAL | Status: AC
  Administered 2024-04-16: 592 mL via ORAL
  Filled 2024-04-16: qty 592

## 2024-04-16 MED ORDER — ENSURE PRE-SURGERY PO LIQD
296.0000 mL | Freq: Once | ORAL | Status: AC
  Administered 2024-04-17: 296 mL via ORAL
  Filled 2024-04-16: qty 296

## 2024-04-16 NOTE — Progress Notes (Addendum)
 PHARMACY - ANTICOAGULATION CONSULT NOTE  Pharmacy Consult for heparin   Indication: hx atrial fibrillation and stroke (PTA warfarin on hold)  Allergies  Allergen Reactions   Sulfa Antibiotics Rash    rash    Patient Measurements: Height: 5' 2 (157.5 cm) Weight: 65.8 kg (145 lb 1 oz) IBW/kg (Calculated) : 50.1 HEPARIN  DW (KG): 63.6  Vital Signs: Temp: 97.8 F (36.6 C) (08/05 0947) Temp Source: Oral (08/05 0947) BP: 129/82 (08/05 0947) Pulse Rate: 59 (08/05 0947)  Labs: Recent Labs    04/14/24 0319 04/14/24 1746 04/14/24 1746 04/15/24 0329 04/15/24 1432 04/16/24 0328 04/16/24 1322  HGB  --  11.9*   < > 11.4*  --  11.0*  --   HCT  --  40.4  --  36.3  --  36.8  --   PLT  --  201  --  199  --  186  --   LABPROT 20.9*  --   --  17.7*  --  15.7*  --   INR 1.7*  --   --  1.4*  --  1.2  --   HEPARINUNFRC  --  0.46   < > 0.76* 0.57 0.72* 0.63  CREATININE 0.52  --   --   --   --   --   --    < > = values in this interval not displayed.    Estimated Creatinine Clearance: 42.4 mL/min (by C-G formula based on SCr of 0.52 mg/dL).   Medications:  - PTA warfarin regimen: 3.75 mg daily per office note on 03/25/24 (last dose taken on 7/26)   Assessment: Patient is an 89 y.o F with hx CVA and afib on warfarin PTA who presented to the ED on 04/08/24 with c/o abdominal pain and n/v/d.  Abdominal/pelvis CT on 04/08/24 showed colonic mass and lung nodules. Warfarin placed on hold on admission and INR reversed with vit K for colonoscopy procedure. She underwent colonoscopy on 8/2 and was noted to have a partially obstructing large mass in the distal transverse colon/ proximal descending colon with concern for malignancy.  Pharmacy has been consulted on 04/14/24 to bridge with heparin  drip in case surgery is needed for colon mass.   Significant events:  - 7/29: vit K 2.5 mg PO x1 - 7/30: vit K 10mg  PO x1 in AM  and 5mg  PO x1 in PM - 7/31: vit K 10 mg PO x1 - 8/2: colonoscopy  Today,  04/16/2024: - HL 0.63 therapeutic on heparin  700 units/hr - Hgb slightly low but stable; Plt stable WNL - RN reports bloody urine this afternoon, MD and surgery aware  Goal of Therapy:  Heparin  level 0.3-0.7 units/ml Monitor platelets by anticoagulation protocol: Yes   Plan:  - Per MD, hold heparin  now. RN to follow-up with MD this evening (prior to dayshift RN leaving) to re-evaluate bloody urine.  - heparin  to be held tonight at 2030 in preparation of tomorrow's partial colectomy procedure - daily heparin  level and CBC - monitor for s/sx bleeding  - F/u for ability to transition back to warfarin  Addendum: - Patient had a second episode of hematuria so will continue to hold heparin  until surgery complete tomorrow, per MD.   Lacinda Moats, PharmD Clinical Pharmacist  8/5/20252:40 PM

## 2024-04-16 NOTE — Progress Notes (Signed)
 Daily Progress Note   Patient Name: Melissa Knight       Date: 04/16/2024 DOB: Jul 06, 1933  Age: 89 y.o. MRN#: [id number] Attending Physician: Vernon Ranks, MD Primary Care Physician: [name], FNP Admit Date: 04/08/2024  Reason for Consultation/Follow-up: Establishing goals of care  Subjective: No acute distress, anticipating surgery in am, biopsy from colonoscopy pending.    Length of Stay: 7  Current Medications: Scheduled Meds:   [START ON 04/17/2024] acetaminophen   1,000 mg Oral On Call to OR   [START ON 04/17/2024] alvimopan   12 mg Oral On Call to OR   amLODipine   2.5 mg Oral Daily   atorvastatin   10 mg Oral q1800   Chlorhexidine  Gluconate Cloth  6 each Topical Once   feeding supplement  1 Container Oral TID BM   levETIRAcetam   500 mg Oral Daily   levothyroxine   88 mcg Oral Q0600   metoprolol  tartrate  100 mg Oral Daily   And   metoprolol  tartrate  50 mg Oral QHS    Continuous Infusions:  [START ON 04/17/2024] cefoTEtan  (CEFOTAN ) IV     heparin  700 Units/hr (04/16/24 0532)    PRN Meds: acetaminophen  **OR** acetaminophen , ondansetron  **OR** ondansetron  (ZOFRAN ) IV, mouth rinse, prochlorperazine   Physical Exam         Awake alert Resting in chair No distress Regular work of breathing  Vital Signs: BP 129/82 (BP Location: Right Arm)   Pulse (!) 59   Temp 97.8 F (36.6 C) (Oral)   Resp 14   Ht 5' 2 (1.575 m)   Wt 65.8 kg   SpO2 99%   BMI 26.53 kg/m  SpO2: SpO2: 99 % O2 Device: O2 Device: Room Air O2 Flow Rate:    Intake/output summary:  Intake/Output Summary (Last 24 hours) at 04/16/2024 1028 Last data filed at 04/16/2024 1000 Gross per 24 hour  Intake 1748.78 ml  Output --  Net 1748.78 ml   LBM: Last BM Date : 04/16/24 Baseline Weight: Weight: 65.8  kg Most recent weight: Weight: 65.8 kg       Palliative Assessment/Data:      Patient Active Problem List   Diagnosis Date Noted   Palliative care encounter 04/14/2024   Counseling and coordination of care 04/14/2024   Goals of care, counseling/discussion 04/14/2024   Malnutrition of moderate degree 04/11/2024  Coagulopathy (HCC) 04/09/2024   Hypokalemia 04/09/2024   History of embolic stroke 04/09/2024   Colonic mass 04/08/2024   Morbid obesity (HCC) 10/05/2016   Lichen sclerosus of female genitalia 10/10/2014   Stress incontinence 10/10/2014   Hypothyroidism (acquired) 06/05/2014   Metabolic syndrome 01/01/2014   Hyperlipidemia 02/05/2013   Permanent atrial fibrillation (HCC) 07/02/2012   Late effect of cerebrovascular accident 06/30/2012   Seizure disorder, generalized convulsive, intractable (HCC) 06/30/2012   Essential hypertension 06/30/2012    Palliative Care Assessment & Plan   Patient Profile:    Assessment:  Patient Profile: Melissa Knight is a 89 y.o. female with a hx of permanent A-fib, hypertension, hyperlipidemia, hypothyroidism, history of CVA 06/2012 and history of seizure. near-obstruction colonic mass - colonoscopy with near-obstructing distal transverse mass, biopsies pending  Cardiology consult for pre op clearance has been requested.   Recommendations/Plan:  Full code full scope care, anticipating surgery in am. PMT to follow hospital course, post op course and remains available for ongoing goals of care discussions.    Social and Psycho social components of care: Patient lives by herself, her husband got COVID and PNA a few months ago, which has exacerbated his mild cognitive impairment and he is now in SNF.  She has 3 sons, 2 live out of state, son Cathlyn lives in [place], [place].   Goals of Care and Additional Recommendations: Limitations on Scope of Treatment: Full Scope Treatment  Code Status:    Code Status Orders  (From admission, onward)            Start     Ordered   04/09/24 0249  Full code  Continuous       Question:  By:  Answer:  Consent: discussion documented in EHR   04/09/24 0249           Code Status History     This patient has a current code status but no historical code status.       Prognosis:  Unable to determine  Discharge Planning: To Be Determined  Care plan was discussed with IDT  Thank you for allowing the Palliative Medicine Team to assist in the care of this patient. low MDM.      Greater than 50%  of this time was spent counseling and coordinating care related to the above assessment and plan.  Lonia Serve, MD  Please contact Palliative Medicine Team phone at [phone] for questions and concerns.

## 2024-04-16 NOTE — Progress Notes (Signed)
 PHARMACY - ANTICOAGULATION CONSULT NOTE  Pharmacy Consult for heparin   Indication: hx atrial fibrillation and stroke (PTA warfarin on hold)  Allergies  Allergen Reactions   Sulfa Antibiotics Rash    rash    Patient Measurements: Height: 5' 2 (157.5 cm) Weight: 65.8 kg (145 lb 1 oz) IBW/kg (Calculated) : 50.1 HEPARIN  DW (KG): 63.6  Vital Signs: Temp: 98.3 F (36.8 C) (08/04 2046) Temp Source: Oral (08/04 2046) BP: 105/63 (08/04 2046) Pulse Rate: 75 (08/04 2046)  Labs: Recent Labs    04/14/24 0319 04/14/24 1746 04/14/24 1746 04/15/24 0329 04/15/24 1432 04/16/24 0328  HGB  --  11.9*   < > 11.4*  --  11.0*  HCT  --  40.4  --  36.3  --  36.8  PLT  --  201  --  199  --  186  LABPROT 20.9*  --   --  17.7*  --  15.7*  INR 1.7*  --   --  1.4*  --  1.2  HEPARINUNFRC  --  0.46   < > 0.76* 0.57 0.72*  CREATININE 0.52  --   --   --   --   --    < > = values in this interval not displayed.    Estimated Creatinine Clearance: 42.4 mL/min (by C-G formula based on SCr of 0.52 mg/dL).   Medications:  - PTA warfarin regimen: 3.75 mg daily per office note on 03/25/24 (last dose taken on 7/26)   Assessment: Patient is an 89 y.o F with hx CVA and afib on warfarin PTA who presented to the ED on 04/08/24 with c/o abdominal pain and n/v/d.  Abdominal/pelvis CT on 04/08/24 showed colonic mass and lung nodules. Warfarin placed on hold on admission and INR reversed with vit K for colonoscopy procedure. She underwent colonoscopy on 8/2 and was noted to have a partially obstructing large mass in the distal transverse colon/ proximal descending colon with concern for malignancy.  Pharmacy has been consulted on 04/14/24 to bridge with heparin  drip in case surgery is needed for colon mass.   Significant events:  - 7/29: vit K 2.5 mg PO x1 - 7/30: vit K 10mg  PO x1 in AM  and 5mg  PO x1 in PM - 7/31: vit K 10 mg PO x1 - 8/2: colonoscopy   Today, 04/16/2024: - HL 0.72 slightly supra-therapeutic on  800 units/hr - Hgb slightly low but stable; Plt stable WNL - No bleeding complication reported   Goal of Therapy:  Heparin  level 0.3-0.7 units/ml Monitor platelets by anticoagulation protocol: Yes   Plan:  - decrease heparin  drip to 700 units/hr - Check heparin  level in 8 hours - daily heparin  level and CBC - monitor for s/sx bleeding  - F/u for ability to transition back to warfarin  Leeroy Mace RPh 04/16/2024, 5:13 AM

## 2024-04-16 NOTE — Plan of Care (Signed)
  Problem: Coping: Goal: Level of anxiety will decrease Outcome: Progressing   Problem: Elimination: Goal: Will not experience complications related to bowel motility Outcome: Progressing Goal: Will not experience complications related to urinary retention Outcome: Progressing   Problem: Safety: Goal: Ability to remain free from injury will improve Outcome: Progressing   Problem: Skin Integrity: Goal: Risk for impaired skin integrity will decrease Outcome: Progressing   

## 2024-04-16 NOTE — Progress Notes (Signed)
 Assessment & Plan: HD#8 - near-obstruction colonic mass  - colonoscopy with near-obstructing distal transverse mass, biopsies pending  - atrial fib - Coumadin  on hold, heparin  gtts, will hold tonight at 2030 for OR tomorrow  - pre-op cardiology evaluation appreciated.  Echo looks good.  - CLD, bowel prep today, abx bowel prep, NPO p 0430 for OR at 0830  - hopefully can avoid colostomy.  Pathology pending from colonoscopy.       Burnard FORBES Banter, [facility] practice Office: [phone]        Chief Complaint: Near-obstruction colonic mass  Subjective: Patient in bed, comfortable.  No complaints  Objective: Vital signs in last 24 hours: Temp:  [97.8 F (36.6 C)-98.7 F (37.1 C)] 98.7 F (37.1 C) (08/05 0535) Pulse Rate:  [55-75] 55 (08/05 0535) Resp:  [15-16] 16 (08/05 0535) BP: (105-135)/(63-73) 113/64 (08/05 0535) SpO2:  [98 %-100 %] 99 % (08/05 0535) Last BM Date : 04/15/24  Intake/Output from previous day: 08/04 0701 - 08/05 0700 In: 1508.8 [P.O.:1320; I.V.:188.8] Out: -  Intake/Output this shift: No intake/output data recorded.  Physical Exam: Gen: NAD, laying in bed Abdomen - soft, non-tender, no mass palpable  Lab Results:  Recent Labs    04/15/24 0329 04/16/24 0328  WBC 7.4 11.7*  HGB 11.4* 11.0*  HCT 36.3 36.8  PLT 199 186   BMET Recent Labs    04/14/24 0319  NA 137  K 3.8  CL 106  CO2 22  GLUCOSE 120*  BUN <5*  CREATININE 0.52  CALCIUM  8.5*   PT/INR Recent Labs    04/15/24 0329 04/16/24 0328  LABPROT 17.7* 15.7*  INR 1.4* 1.2   Comprehensive Metabolic Panel:    Component Value Date/Time   NA 137 04/14/2024 0319   NA 137 04/13/2024 0356   NA 144 05/08/2023 1218   NA 141 10/11/2022 1154   K 3.8 04/14/2024 0319   K 3.9 04/13/2024 0356   CL 106 04/14/2024 0319   CL 108 04/13/2024 0356   CO2 22 04/14/2024 0319   CO2 20 (L) 04/13/2024 0356   BUN <5 (L) 04/14/2024 0319   BUN <5 (L)  04/13/2024 0356   BUN 12 05/08/2023 1218   BUN 11 10/11/2022 1154   CREATININE 0.52 04/14/2024 0319   CREATININE 0.84 04/13/2024 0356   CREATININE 0.69 01/30/2013 1117   GLUCOSE 120 (H) 04/14/2024 0319   GLUCOSE 99 04/13/2024 0356   CALCIUM  8.5 (L) 04/14/2024 0319   CALCIUM  8.6 (L) 04/13/2024 0356   AST 14 (L) 04/09/2024 0320   AST 19 04/08/2024 1822   ALT 9 04/09/2024 0320   ALT 7 04/08/2024 1822   ALKPHOS 27 (L) 04/09/2024 0320   ALKPHOS 38 04/08/2024 1822   BILITOT 1.0 04/09/2024 0320   BILITOT 0.6 04/08/2024 1822   BILITOT 0.5 05/08/2023 1218   BILITOT 0.4 10/11/2022 1154   PROT 5.1 (L) 04/09/2024 0320   PROT 6.4 (L) 04/08/2024 1822   PROT 5.8 (L) 05/08/2023 1218   PROT 6.1 10/11/2022 1154   ALBUMIN 2.7 (L) 04/09/2024 0320   ALBUMIN 3.8 04/08/2024 1822   ALBUMIN 3.8 05/08/2023 1218   ALBUMIN 3.9 10/11/2022 1154    Studies/Results: ECHOCARDIOGRAM COMPLETE Result Date: 04/15/2024    ECHOCARDIOGRAM REPORT   Patient Name:   Norene Negrete Date of Exam: 04/15/2024 Medical Rec #:  [id number]    Height:       62.0 in Accession #:    [id number]  Weight:       145.1 lb Date of Birth:  1933-02-12     BSA:          1.668 m Patient Age:    89 years     BP:           135/67 mmHg Patient Gender: F            HR:           97 bpm. Exam Location:  Inpatient Procedure: 2D Echo, Color Doppler and Cardiac Doppler (Both Spectral and Color            Flow Doppler were utilized during procedure). Indications:    Atrial fibrillation  History:        Patient has no prior history of Echocardiogram examinations.                 Risk Factors:Hypertension and Dyslipidemia.  Sonographer:    Philomena Daring Referring Phys: [id number] HAO MENG IMPRESSIONS  1. Left ventricular ejection fraction, by estimation, is 60 to 65%. The left ventricle has normal function. The left ventricle has no regional wall motion abnormalities. Left ventricular diastolic function could not be evaluated.  2. Right ventricular systolic function  is normal. The right ventricular size is normal.  3. The mitral valve is normal in structure. Mild mitral valve regurgitation. No evidence of mitral stenosis.  4. The aortic valve is tricuspid. There is mild calcification of the aortic valve. Aortic valve regurgitation is not visualized. Aortic valve sclerosis/calcification is present, without any evidence of aortic stenosis.  5. The inferior vena cava is normal in size with greater than 50% respiratory variability, suggesting right atrial pressure of 3 mmHg. FINDINGS  Left Ventricle: Left ventricular ejection fraction, by estimation, is 60 to 65%. The left ventricle has normal function. The left ventricle has no regional wall motion abnormalities. The left ventricular internal cavity size was normal in size. There is  no left ventricular hypertrophy. Left ventricular diastolic function could not be evaluated due to atrial fibrillation. Left ventricular diastolic function could not be evaluated. Right Ventricle: The right ventricular size is normal. No increase in right ventricular wall thickness. Right ventricular systolic function is normal. Left Atrium: Left atrial size was normal in size. Right Atrium: Right atrial size was normal in size. Pericardium: There is no evidence of pericardial effusion. Mitral Valve: The mitral valve is normal in structure. Mild mitral valve regurgitation. No evidence of mitral valve stenosis. Tricuspid Valve: The tricuspid valve is normal in structure. Tricuspid valve regurgitation is mild . No evidence of tricuspid stenosis. Aortic Valve: The aortic valve is tricuspid. There is mild calcification of the aortic valve. Aortic valve regurgitation is not visualized. Aortic valve sclerosis/calcification is present, without any evidence of aortic stenosis. Pulmonic Valve: The pulmonic valve was normal in structure. Pulmonic valve regurgitation is not visualized. No evidence of pulmonic stenosis. Aorta: The aortic root is normal in size  and structure. Venous: The inferior vena cava is normal in size with greater than 50% respiratory variability, suggesting right atrial pressure of 3 mmHg. IAS/Shunts: No atrial level shunt detected by color flow Doppler.  LEFT VENTRICLE PLAX 2D LVIDd:         3.50 cm     Diastology LVIDs:         2.80 cm     LV e' medial:    6.74 cm/s LV PW:         1.30 cm  LV E/e' medial:  12.5 LV IVS:        1.20 cm     LV e' lateral:   10.30 cm/s LVOT diam:     1.60 cm     LV E/e' lateral: 8.2 LV SV:         27 LV SV Index:   16 LVOT Area:     2.01 cm  LV Volumes (MOD) LV vol d, MOD A2C: 41.3 ml LV vol d, MOD A4C: 58.9 ml LV vol s, MOD A2C: 13.8 ml LV vol s, MOD A4C: 18.5 ml LV SV MOD A2C:     27.5 ml LV SV MOD A4C:     58.9 ml LV SV MOD BP:      33.8 ml RIGHT VENTRICLE             IVC RV S prime:     11.40 cm/s  IVC diam: 1.20 cm TAPSE (M-mode): 1.4 cm LEFT ATRIUM             Index        RIGHT ATRIUM           Index LA Vol (A2C):   38.7 ml 23.20 ml/m  RA Area:     16.10 cm LA Vol (A4C):   49.0 ml 29.38 ml/m  RA Volume:   36.00 ml  21.59 ml/m LA Biplane Vol: 45.0 ml 26.98 ml/m  AORTIC VALVE LVOT Vmax:   77.10 cm/s LVOT Vmean:  49.200 cm/s LVOT VTI:    0.132 m  AORTA Ao Root diam: 3.00 cm Ao Asc diam:  3.80 cm MITRAL VALVE               TRICUSPID VALVE MV Area (PHT): 3.49 cm    TR Peak grad:   24.8 mmHg MV E velocity: 84.40 cm/s  TR Vmax:        249.00 cm/s                             SHUNTS                            Systemic VTI:  0.13 m                            Systemic Diam: 1.60 cm Morene Brownie Electronically signed by Morene Brownie Signature Date/Time: 04/15/2024/5:49:34 PM    Final    CT CHEST W CONTRAST Result Date: 04/15/2024 CLINICAL DATA:  Colon cancer, assess treatment response. * Tracking Code: BO * EXAM: CT CHEST WITH CONTRAST TECHNIQUE: Multidetector CT imaging of the chest was performed during intravenous contrast administration. RADIATION DOSE REDUCTION: This exam was performed according to the  departmental dose-optimization program which includes automated exposure control, adjustment of the mA and/or kV according to patient size and/or use of iterative reconstruction technique. CONTRAST:  75mL OMNIPAQUE  IOHEXOL  300 MG/ML  SOLN COMPARISON:  None Available. FINDINGS: Cardiovascular: Normal cardiac size. No pericardial effusion. No aortic aneurysm. There are coronary artery calcifications, in keeping with coronary artery disease. There are also moderate peripheral atherosclerotic vascular calcifications of thoracic aorta and its major branches. Mediastinum/Nodes: Visualized thyroid  gland appears grossly unremarkable. No solid / cystic mediastinal masses. The esophagus is nondistended precluding optimal assessment. There are multiple partially calcified mediastinal and right hilar lymph nodes, which are nonspecific but commonly seen as a sequela  of prior granulomatous infection. No axillary or left hilar lymphadenopathy by size criteria. Lungs/Pleura: The central tracheo-bronchial tree is patent. There multiple calcified granulomas throughout bilateral lungs with largest in the right upper lobe measuring up to 6 mm. No suspicious lung nodule. There is trace left pleural effusion, which is new since the prior study from 04/08/2024. no right pleural effusion. No mass, consolidation or pneumothorax. Upper Abdomen: Multiple calcified granulomas noted in the spleen and liver. There is an additional subcentimeter hypoattenuating structure in the right hepatic lobe, which is too small to adequately characterize. Small diverticulum noted arising from the second part of duodenum. Remaining visualized upper abdominal viscera within normal limits. Musculoskeletal: The visualized soft tissues of the chest wall are grossly unremarkable. No suspicious osseous lesions. There are mild to moderate multilevel degenerative changes in the visualized spine. IMPRESSION: 1. No metastatic disease identified within the chest. 2.  Multiple other nonacute observations, as described above. Aortic Atherosclerosis (ICD10-I70.0). Electronically Signed   By: Ree Molt M.D.   On: 04/15/2024 12:00      Burnard FORBES Banter 04/16/2024  Patient ID: [name], female   DOB: 1933-02-14, 89 y.o.   MRN: [id number]

## 2024-04-16 NOTE — Anesthesia Preprocedure Evaluation (Signed)
 Anesthesia Evaluation  Patient identified by MRN, date of birth, ID band Patient awake    Reviewed: Allergy & Precautions, NPO status , Patient's Chart, lab work & pertinent test results  History of Anesthesia Complications (+) PONV and history of anesthetic complications  Airway Mallampati: III  TM Distance: >3 FB Neck ROM: Full    Dental  (+) Missing, Dental Advisory Given,    Pulmonary former smoker   breath sounds clear to auscultation       Cardiovascular hypertension, Pt. on medications and Pt. on home beta blockers + dysrhythmias Atrial Fibrillation  Rhythm:Regular Rate:Normal     Neuro/Psych Seizures -,  CVA    GI/Hepatic negative GI ROS, Neg liver ROS,,,  Endo/Other  negative endocrine ROSHypothyroidism    Renal/GU negative Renal ROS     Musculoskeletal negative musculoskeletal ROS (+)    Abdominal   Peds  Hematology negative hematology ROS (+)   Anesthesia Other Findings   Reproductive/Obstetrics                              Anesthesia Physical Anesthesia Plan  ASA: 3  Anesthesia Plan: General   Post-op Pain Management: Tylenol  PO (pre-op)*   Induction: Intravenous  PONV Risk Score and Plan: 4 or greater and Ondansetron , Treatment may vary due to age or medical condition and TIVA  Airway Management Planned: Oral ETT  Additional Equipment: None  Intra-op Plan:   Post-operative Plan: Extubation in OR  Informed Consent: I have reviewed the patients History and Physical, chart, labs and discussed the procedure including the risks, benefits and alternatives for the proposed anesthesia with the patient or authorized representative who has indicated his/her understanding and acceptance.     Dental advisory given  Plan Discussed with: CRNA  Anesthesia Plan Comments:          Anesthesia Quick Evaluation

## 2024-04-16 NOTE — Progress Notes (Signed)
 PROGRESS NOTE    [name]  FMW:[id number] DOB: 07/10/1933 DOA: 04/08/2024 PCP: [name], FNP   Brief Narrative:  Keilee Denman is a 89 y.o. female with history of permanent atrial fibrillation, hypertension, hypothyroidism, embolic stroke presents to the ER with complaints of abdominal pain with nausea vomiting and diarrhea since 3 days. she has lost at least 50 pounds in the last 1 year.    In the ER CT abdomen pelvis shows features concerning for colonic mass in the transverse colon concerning for colon cancer.  There is also lung nodules in the chest fields concerning for possible metastasis.  Admitted under hospital service, status post colonoscopy which shows near obstructing mass, general surgery consulted.  Assessment & Plan:   Principal Problem:   Colonic mass Active Problems:   Seizure disorder, generalized convulsive, intractable (HCC)   Permanent atrial fibrillation (HCC)   Hypothyroidism (acquired)   Coagulopathy (HCC)   Hypokalemia   History of embolic stroke   Malnutrition of moderate degree   Palliative care encounter   Counseling and coordination of care   Goals of care, counseling/discussion  Colon mass: Concerning for malignancy.  [name] on board.  Status post Colonoscopy 04/13/2024 showed partially obstructing mass with pinpoint opening in the distal transverse/proximal descending colon. Biopsies taken.polyps from sigmoid colon removed with a cold snare.  General surgery consulted per GI recommendations, patient may need partial colectomy.  Seen by cardiology for preoperative clearance, is considered to be high risk for the surgery and patient understands and has agreed to take the risk and proceed with the surgery.  I was informed by RN that she is scheduled to have surgery tomorrow.  Pathology report still pending.  CT chest with contrast was ordered for completeness of the staging which shows no metastatic disease.  History of paroxysmal atrial  fibrillation/coagulopathy/supratherapeutic INR: Rate is controlled.  Continue metoprolol .  Continue to hold Coumadin  for upcoming surgery.  Continue heparin  for now.  Acute normocytic anemia: Likely secondary to cancer.  Slight drop in hemoglobin down to 9.8, no indication of transfusion.  Essential hypertension: Controlled, continue amlodipine  and metoprolol .  Holding lisinopril  for now.  History of stroke: Continue statins.  Holding Coumadin .  Hypokalemia: Resolved.  Acquired hypothyroidism: Continue Synthroid .  History of seizure disorder: Continue Keppra .  UTI ruled out: Previous documentation and states that there is possible UTI and patient on empiric antibiotics however patient does not appear to be on any antibiotics.  Patient denies any urinary complaints.  No fever and no leukocytosis.  Doubt UTI.  Asymptomatic bacteriuria ruled in.  DVT prophylaxis: SCD's Start: 04/16/24 0725 SCDs Start: 04/09/24 0249   Code Status: Full Code  Family Communication:  None present at bedside.  Plan of care discussed with patient in length and he/she verbalized understanding and agreed with it.  Status is: Inpatient Remains inpatient appropriate because: Needs colectomy.   Estimated body mass index is 26.53 kg/m as calculated from the following:   Height as of this encounter: 5' 2 (1.575 m).   Weight as of this encounter: 65.8 kg.    Nutritional Assessment: Body mass index is 26.53 kg/m.SABRA Seen by dietician.  I agree with the assessment and plan as outlined below: Nutrition Status: Nutrition Problem: Moderate Malnutrition Etiology: chronic illness Signs/Symptoms: mild muscle depletion, percent weight loss (13% in 5.5 months) Percent weight loss: 13 % (in 5.5 months) Interventions: Refer to RD note for recommendations, Boost Breeze  . Skin Assessment: I have examined the patient's skin and I  agree with the wound assessment as performed by the wound care RN as outlined below:     Consultants:  GI General Surgery  Cardiology Procedures:  As above  Antimicrobials:  Anti-infectives (From admission, onward)    Start     Dose/Rate Route Frequency Ordered Stop   04/17/24 0600  cefoTEtan  (CEFOTAN ) 2 g in sodium chloride  0.9 % 100 mL IVPB        2 g 200 mL/hr over 30 Minutes Intravenous On call to O.R. 04/16/24 0724 04/18/24 0559   04/16/24 1400  neomycin  (MYCIFRADIN ) tablet 1,000 mg  Status:  Discontinued       Placed in And Linked Group   1,000 mg Oral 3 times per day 04/16/24 0724 04/16/24 0725   04/16/24 1400  metroNIDAZOLE  (FLAGYL ) tablet 1,000 mg  Status:  Discontinued       Placed in And Linked Group   1,000 mg Oral 3 times per day 04/16/24 0724 04/16/24 0725   04/09/24 0430  cefTRIAXone  (ROCEPHIN ) 1 g in sodium chloride  0.9 % 100 mL IVPB  Status:  Discontinued        1 g 200 mL/hr over 30 Minutes Intravenous Every 24 hours 04/09/24 0340 04/09/24 1432         Subjective: Patient seen and examined.  She has no complaints at all.  Objective: Vitals:   04/15/24 0932 04/15/24 1313 04/15/24 2046 04/16/24 0535  BP: 133/73 135/67 105/63 113/64  Pulse: (!) 57 (!) 55 75 (!) 55  Resp:  15 16 16   Temp:  97.8 F (36.6 C) 98.3 F (36.8 C) 98.7 F (37.1 C)  TempSrc:   Oral Oral  SpO2: 100% 99% 98% 99%  Weight:      Height:        Intake/Output Summary (Last 24 hours) at 04/16/2024 0751 Last data filed at 04/16/2024 0600 Gross per 24 hour  Intake 1508.78 ml  Output --  Net 1508.78 ml   Filed Weights   04/08/24 1818 04/13/24 1032  Weight: 65.8 kg 65.8 kg    Examination:  General exam: Appears calm and comfortable  Respiratory system: Clear to auscultation. Respiratory effort normal. Cardiovascular system: S1 & S2 heard, RRR. No JVD, murmurs, rubs, gallops or clicks. No pedal edema. Gastrointestinal system: Abdomen is nondistended, soft and nontender. No organomegaly or masses felt. Normal bowel sounds heard. Central nervous system: Alert  and oriented. No focal neurological deficits. Extremities: Symmetric 5 x 5 power. Skin: No rashes, lesions or ulcers.  Psychiatry: Judgement and insight appear normal. Mood & affect appropriate.   Data Reviewed: I have personally reviewed following labs and imaging studies  CBC: Recent Labs  Lab 04/11/24 0317 04/12/24 0315 04/14/24 1746 04/15/24 0329 04/16/24 0328  WBC 8.1 5.1 7.7 7.4 11.7*  HGB 11.8* 9.8* 11.9* 11.4* 11.0*  HCT 37.7 32.2* 40.4 36.3 36.8  MCV 93.3 93.1 95.7 93.1 94.6  PLT 173 172 201 199 186   Basic Metabolic Panel: Recent Labs  Lab 04/10/24 0319 04/11/24 0317 04/12/24 0315 04/13/24 0356 04/14/24 0319  NA 137 137 135 137 137  K 3.2* 4.1 3.5 3.9 3.8  CL 105 104 106 108 106  CO2 23 22 23  20* 22  GLUCOSE 76 149* 85 99 120*  BUN 7* <5* <5* <5* <5*  CREATININE 0.52 0.55 0.86 0.84 0.52  CALCIUM  8.0* 8.6* 8.2* 8.6* 8.5*   GFR: Estimated Creatinine Clearance: 42.4 mL/min (by C-G formula based on SCr of 0.52 mg/dL). Liver Function Tests: No results for  input(s): AST, ALT, ALKPHOS, BILITOT, PROT, ALBUMIN in the last 168 hours.  No results for input(s): LIPASE, AMYLASE in the last 168 hours.  No results for input(s): AMMONIA in the last 168 hours. Coagulation Profile: Recent Labs  Lab 04/12/24 1600 04/13/24 0356 04/14/24 0319 04/15/24 0329 04/16/24 0328  INR 1.6* 1.6* 1.7* 1.4* 1.2   Cardiac Enzymes: No results for input(s): CKTOTAL, CKMB, CKMBINDEX, TROPONINI in the last 168 hours. BNP (last 3 results) No results for input(s): PROBNP in the last 8760 hours. HbA1C: No results for input(s): HGBA1C in the last 72 hours. CBG: No results for input(s): GLUCAP in the last 168 hours. Lipid Profile: No results for input(s): CHOL, HDL, LDLCALC, TRIG, CHOLHDL, LDLDIRECT in the last 72 hours. Thyroid  Function Tests: No results for input(s): TSH, T4TOTAL, FREET4, T3FREE, THYROIDAB in the last 72  hours. Anemia Panel: No results for input(s): VITAMINB12, FOLATE, FERRITIN, TIBC, IRON, RETICCTPCT in the last 72 hours. Sepsis Labs: No results for input(s): PROCALCITON, LATICACIDVEN in the last 168 hours.   Recent Results (from the past 240 hours)  C Difficile Quick Screen w PCR reflex     Status: None   Collection Time: 04/09/24  7:44 AM   Specimen: STOOL  Result Value Ref Range Status   C Diff antigen NEGATIVE NEGATIVE Final   C Diff toxin NEGATIVE NEGATIVE Final   C Diff interpretation No C. difficile detected.  Final    Comment: Performed at [facility], [address] [place], [place] [address]  Gastrointestinal Panel by PCR , Stool     Status: None   Collection Time: 04/09/24  7:44 AM   Specimen: Stool  Result Value Ref Range Status   Campylobacter species NOT DETECTED NOT DETECTED Final   Plesimonas shigelloides NOT DETECTED NOT DETECTED Final   Salmonella species NOT DETECTED NOT DETECTED Final   Yersinia enterocolitica NOT DETECTED NOT DETECTED Final   Vibrio species NOT DETECTED NOT DETECTED Final   Vibrio cholerae NOT DETECTED NOT DETECTED Final   Enteroaggregative E coli (EAEC) NOT DETECTED NOT DETECTED Final   Enteropathogenic E coli (EPEC) NOT DETECTED NOT DETECTED Final   Enterotoxigenic E coli (ETEC) NOT DETECTED NOT DETECTED Final   Shiga like toxin producing E coli (STEC) NOT DETECTED NOT DETECTED Final   Shigella/Enteroinvasive E coli (EIEC) NOT DETECTED NOT DETECTED Final   Cryptosporidium NOT DETECTED NOT DETECTED Final   Cyclospora cayetanensis NOT DETECTED NOT DETECTED Final   Entamoeba histolytica NOT DETECTED NOT DETECTED Final   Giardia lamblia NOT DETECTED NOT DETECTED Final   Adenovirus F40/41 NOT DETECTED NOT DETECTED Final   Astrovirus NOT DETECTED NOT DETECTED Final   Norovirus GI/GII NOT DETECTED NOT DETECTED Final   Rotavirus A NOT DETECTED NOT DETECTED Final   Sapovirus (I, II, IV, and V) NOT DETECTED NOT  DETECTED Final    Comment: Performed at [facility], [address] [place], [place] [address]     Radiology Studies: ECHOCARDIOGRAM COMPLETE Result Date: 04/15/2024    ECHOCARDIOGRAM REPORT   Patient Name:   Johnelle Arduini Date of Exam: 04/15/2024 Medical Rec #:  [id number]    Height:       62.0 in Accession #:    [id number]   Weight:       145.1 lb Date of Birth:  [birth year]     BSA:          1.668 m Patient Age:    89 years     BP:  135/67 mmHg Patient Gender: F            HR:           97 bpm. Exam Location:  Inpatient Procedure: 2D Echo, Color Doppler and Cardiac Doppler (Both Spectral and Color            Flow Doppler were utilized during procedure). Indications:    Atrial fibrillation  History:        Patient has no prior history of Echocardiogram examinations.                 Risk Factors:Hypertension and Dyslipidemia.  Sonographer:    Philomena Daring Referring Phys: [id number] HAO MENG IMPRESSIONS  1. Left ventricular ejection fraction, by estimation, is 60 to 65%. The left ventricle has normal function. The left ventricle has no regional wall motion abnormalities. Left ventricular diastolic function could not be evaluated.  2. Right ventricular systolic function is normal. The right ventricular size is normal.  3. The mitral valve is normal in structure. Mild mitral valve regurgitation. No evidence of mitral stenosis.  4. The aortic valve is tricuspid. There is mild calcification of the aortic valve. Aortic valve regurgitation is not visualized. Aortic valve sclerosis/calcification is present, without any evidence of aortic stenosis.  5. The inferior vena cava is normal in size with greater than 50% respiratory variability, suggesting right atrial pressure of 3 mmHg. FINDINGS  Left Ventricle: Left ventricular ejection fraction, by estimation, is 60 to 65%. The left ventricle has normal function. The left ventricle has no regional wall motion abnormalities. The left ventricular internal cavity  size was normal in size. There is  no left ventricular hypertrophy. Left ventricular diastolic function could not be evaluated due to atrial fibrillation. Left ventricular diastolic function could not be evaluated. Right Ventricle: The right ventricular size is normal. No increase in right ventricular wall thickness. Right ventricular systolic function is normal. Left Atrium: Left atrial size was normal in size. Right Atrium: Right atrial size was normal in size. Pericardium: There is no evidence of pericardial effusion. Mitral Valve: The mitral valve is normal in structure. Mild mitral valve regurgitation. No evidence of mitral valve stenosis. Tricuspid Valve: The tricuspid valve is normal in structure. Tricuspid valve regurgitation is mild . No evidence of tricuspid stenosis. Aortic Valve: The aortic valve is tricuspid. There is mild calcification of the aortic valve. Aortic valve regurgitation is not visualized. Aortic valve sclerosis/calcification is present, without any evidence of aortic stenosis. Pulmonic Valve: The pulmonic valve was normal in structure. Pulmonic valve regurgitation is not visualized. No evidence of pulmonic stenosis. Aorta: The aortic root is normal in size and structure. Venous: The inferior vena cava is normal in size with greater than 50% respiratory variability, suggesting right atrial pressure of 3 mmHg. IAS/Shunts: No atrial level shunt detected by color flow Doppler.  LEFT VENTRICLE PLAX 2D LVIDd:         3.50 cm     Diastology LVIDs:         2.80 cm     LV e' medial:    6.74 cm/s LV PW:         1.30 cm     LV E/e' medial:  12.5 LV IVS:        1.20 cm     LV e' lateral:   10.30 cm/s LVOT diam:     1.60 cm     LV E/e' lateral: 8.2 LV SV:  27 LV SV Index:   16 LVOT Area:     2.01 cm  LV Volumes (MOD) LV vol d, MOD A2C: 41.3 ml LV vol d, MOD A4C: 58.9 ml LV vol s, MOD A2C: 13.8 ml LV vol s, MOD A4C: 18.5 ml LV SV MOD A2C:     27.5 ml LV SV MOD A4C:     58.9 ml LV SV MOD BP:       33.8 ml RIGHT VENTRICLE             IVC RV S prime:     11.40 cm/s  IVC diam: 1.20 cm TAPSE (M-mode): 1.4 cm LEFT ATRIUM             Index        RIGHT ATRIUM           Index LA Vol (A2C):   38.7 ml 23.20 ml/m  RA Area:     16.10 cm LA Vol (A4C):   49.0 ml 29.38 ml/m  RA Volume:   36.00 ml  21.59 ml/m LA Biplane Vol: 45.0 ml 26.98 ml/m  AORTIC VALVE LVOT Vmax:   77.10 cm/s LVOT Vmean:  49.200 cm/s LVOT VTI:    0.132 m  AORTA Ao Root diam: 3.00 cm Ao Asc diam:  3.80 cm MITRAL VALVE               TRICUSPID VALVE MV Area (PHT): 3.49 cm    TR Peak grad:   24.8 mmHg MV E velocity: 84.40 cm/s  TR Vmax:        249.00 cm/s                             SHUNTS                            Systemic VTI:  0.13 m                            Systemic Diam: 1.60 cm Morene Brownie Electronically signed by Morene Brownie Signature Date/Time: 04/15/2024/5:49:34 PM    Final    CT CHEST W CONTRAST Result Date: 04/15/2024 CLINICAL DATA:  Colon cancer, assess treatment response. * Tracking Code: BO * EXAM: CT CHEST WITH CONTRAST TECHNIQUE: Multidetector CT imaging of the chest was performed during intravenous contrast administration. RADIATION DOSE REDUCTION: This exam was performed according to the departmental dose-optimization program which includes automated exposure control, adjustment of the mA and/or kV according to patient size and/or use of iterative reconstruction technique. CONTRAST:  75mL OMNIPAQUE  IOHEXOL  300 MG/ML  SOLN COMPARISON:  None Available. FINDINGS: Cardiovascular: Normal cardiac size. No pericardial effusion. No aortic aneurysm. There are coronary artery calcifications, in keeping with coronary artery disease. There are also moderate peripheral atherosclerotic vascular calcifications of thoracic aorta and its major branches. Mediastinum/Nodes: Visualized thyroid  gland appears grossly unremarkable. No solid / cystic mediastinal masses. The esophagus is nondistended precluding optimal assessment. There are  multiple partially calcified mediastinal and right hilar lymph nodes, which are nonspecific but commonly seen as a sequela of prior granulomatous infection. No axillary or left hilar lymphadenopathy by size criteria. Lungs/Pleura: The central tracheo-bronchial tree is patent. There multiple calcified granulomas throughout bilateral lungs with largest in the right upper lobe measuring up to 6 mm. No suspicious lung nodule. There is trace left pleural effusion, which is new  since the prior study from 04/08/2024. no right pleural effusion. No mass, consolidation or pneumothorax. Upper Abdomen: Multiple calcified granulomas noted in the spleen and liver. There is an additional subcentimeter hypoattenuating structure in the right hepatic lobe, which is too small to adequately characterize. Small diverticulum noted arising from the second part of duodenum. Remaining visualized upper abdominal viscera within normal limits. Musculoskeletal: The visualized soft tissues of the chest wall are grossly unremarkable. No suspicious osseous lesions. There are mild to moderate multilevel degenerative changes in the visualized spine. IMPRESSION: 1. No metastatic disease identified within the chest. 2. Multiple other nonacute observations, as described above. Aortic Atherosclerosis (ICD10-I70.0). Electronically Signed   By: Ree Molt M.D.   On: 04/15/2024 12:00     Scheduled Meds:  [START ON 04/17/2024] acetaminophen   1,000 mg Oral On Call to OR   [START ON 04/17/2024] alvimopan   12 mg Oral On Call to OR   amLODipine   2.5 mg Oral Daily   atorvastatin   10 mg Oral q1800   Chlorhexidine  Gluconate Cloth  6 each Topical Once   And   Chlorhexidine  Gluconate Cloth  6 each Topical Once   feeding supplement  1 Container Oral TID BM   levETIRAcetam   500 mg Oral Daily   levothyroxine   88 mcg Oral Q0600   metoprolol  tartrate  100 mg Oral Daily   And   metoprolol  tartrate  50 mg Oral QHS   Continuous Infusions:  [START ON  04/17/2024] cefoTEtan  (CEFOTAN ) IV     heparin  700 Units/hr (04/16/24 0532)      LOS: 7 days   Fredia Skeeter, MD [facility]  04/16/2024, 7:51 AM   *Please note that this is a verbal dictation therefore any spelling or grammatical errors are due to the Dragon Medical One system interpretation.  Please page via Amion and do not message via secure chat for urgent patient care matters. Secure chat can be used for non urgent patient care matters.  How to contact the [facility] Attending or Consulting provider 7A - 7P or covering provider during after hours 7P -7A, for this patient?  Check the care team in [facility] and look for a) attending/consulting [facility] provider listed and b) the [facility] team listed. Page or secure chat 7A-7P. Log into [email or web address] and use [facility] universal password to access. If you do not have the password, please contact the hospital operator. Locate the [facility] provider you are looking for under [facility] and page to a number that you can be directly reached. If you still have difficulty reaching the provider, please page the [facility] (Director on Call) for the Hospitalists listed on amion for assistance.

## 2024-04-16 NOTE — Plan of Care (Signed)

## 2024-04-16 NOTE — Progress Notes (Signed)
 Pt had 200 ml of red bloody urine. MD and pharmacist notified. Heparin  on hold for now. Will continue to monitor for active bleeding and keep MD informed. Pt currently resting in recliner. Pt finished bowel prep (miralax ). Had 3 loose stool, yellowish brown in color. Pt on clear liquid diet. No c/o n/v or pain.

## 2024-04-16 NOTE — Progress Notes (Signed)
 Informed consent signed by the pt and stored in the pt's chart.

## 2024-04-16 NOTE — Plan of Care (Signed)
  Problem: Coping: Goal: Level of anxiety will decrease Outcome: Progressing   Problem: Elimination: Goal: Will not experience complications related to bowel motility Outcome: Progressing Goal: Will not experience complications related to urinary retention Outcome: Progressing   Problem: Safety: Goal: Ability to remain free from injury will improve Outcome: Progressing   

## 2024-04-17 ENCOUNTER — Other Ambulatory Visit: Payer: Self-pay

## 2024-04-17 ENCOUNTER — Inpatient Hospital Stay (HOSPITAL_COMMUNITY): Payer: Self-pay | Admitting: Certified Registered Nurse Anesthetist

## 2024-04-17 ENCOUNTER — Encounter (HOSPITAL_COMMUNITY): Payer: Self-pay | Admitting: Internal Medicine

## 2024-04-17 ENCOUNTER — Encounter (HOSPITAL_COMMUNITY)
Admission: EM | Disposition: A | Discharge status: Home Health | Payer: Self-pay | Source: Home / Self Care | Attending: Internal Medicine

## 2024-04-17 DIAGNOSIS — I10 Essential (primary) hypertension: Secondary | ICD-10-CM

## 2024-04-17 DIAGNOSIS — K6389 Other specified diseases of intestine: Secondary | ICD-10-CM | POA: Diagnosis not present

## 2024-04-17 DIAGNOSIS — E039 Hypothyroidism, unspecified: Secondary | ICD-10-CM | POA: Diagnosis not present

## 2024-04-17 DIAGNOSIS — I4891 Unspecified atrial fibrillation: Secondary | ICD-10-CM | POA: Diagnosis not present

## 2024-04-17 DIAGNOSIS — D123 Benign neoplasm of transverse colon: Secondary | ICD-10-CM

## 2024-04-17 HISTORY — PX: LAPAROTOMY: SHX154

## 2024-04-17 LAB — BASIC METABOLIC PANEL WITH GFR
Anion gap: 10 (ref 5–15)
BUN: 7 mg/dL — ABNORMAL LOW (ref 8–23)
CO2: 25 mmol/L (ref 22–32)
Calcium: 8.7 mg/dL — ABNORMAL LOW (ref 8.9–10.3)
Chloride: 103 mmol/L (ref 98–111)
Creatinine, Ser: 0.56 mg/dL (ref 0.44–1.00)
GFR, Estimated: >60 mL/min (ref >60)
Glucose, Bld: 97 mg/dL (ref 70–99)
Potassium: 4.1 mmol/L (ref 3.5–5.1)
Sodium: 138 mmol/L (ref 135–145)

## 2024-04-17 LAB — TYPE AND SCREEN
ABO/RH(D): AB NEG
Antibody Screen: NEGATIVE

## 2024-04-17 LAB — SURGICAL PCR SCREEN
MRSA, PCR: NEGATIVE
Staphylococcus aureus: POSITIVE — AB

## 2024-04-17 LAB — CBC
HCT: 34 % — ABNORMAL LOW (ref 36.0–46.0)
Hemoglobin: 10.3 g/dL — ABNORMAL LOW (ref 12.0–15.0)
MCH: 28.3 pg (ref 26.0–34.0)
MCHC: 30.3 g/dL (ref 30.0–36.0)
MCV: 93.4 fL (ref 80.0–100.0)
Platelets: 213 K/uL (ref 150–400)
RBC: 3.64 MIL/uL — ABNORMAL LOW (ref 3.87–5.11)
RDW: 14.2 % (ref 11.5–15.5)
WBC: 7.3 K/uL (ref 4.0–10.5)
nRBC: 0 % (ref 0.0–0.2)

## 2024-04-17 LAB — PROTIME-INR
INR: 1.1 (ref 0.8–1.2)
Prothrombin Time: 14.4 s (ref 11.4–15.2)

## 2024-04-17 LAB — SURGICAL PATHOLOGY

## 2024-04-17 SURGERY — LAPAROTOMY, EXPLORATORY
Anesthesia: General

## 2024-04-17 MED ORDER — PHENYLEPHRINE HCL (PRESSORS) 10 MG/ML IV SOLN
INTRAVENOUS | Status: DC | PRN
  Administered 2024-04-17 (×2): 80 ug via INTRAVENOUS

## 2024-04-17 MED ORDER — CHLORHEXIDINE GLUCONATE CLOTH 2 % EX PADS: 6.0000 | MEDICATED_PAD | Freq: Every day | CUTANEOUS | Status: DC

## 2024-04-17 MED ORDER — SODIUM CHLORIDE 0.45 % IV SOLN: INTRAVENOUS | Status: DC

## 2024-04-17 MED ORDER — HEPARIN (PORCINE) 25000 UT/250ML-% IV SOLN
800.0000 [IU]/h | INTRAVENOUS | Status: DC
  Administered 2024-04-17 (×2): 700 [IU]/h via INTRAVENOUS
  Administered 2024-04-19: 850 [IU]/h via INTRAVENOUS
  Filled 2024-04-17 (×2): qty 250

## 2024-04-17 MED ORDER — PROPOFOL 500 MG/50ML IV EMUL
INTRAVENOUS | Status: DC | PRN
  Administered 2024-04-17: 125 ug/kg/min via INTRAVENOUS

## 2024-04-17 MED ORDER — HYDROMORPHONE HCL 1 MG/ML IJ SOLN: 1.0000 mg | INTRAMUSCULAR | Status: DC | PRN

## 2024-04-17 MED ORDER — ONDANSETRON HCL 4 MG/2ML IJ SOLN
INTRAMUSCULAR | Status: AC
  Filled 2024-04-17: qty 2

## 2024-04-17 MED ORDER — ROCURONIUM BROMIDE 100 MG/10ML IV SOLN
INTRAVENOUS | Status: DC | PRN
  Administered 2024-04-17: 50 mg via INTRAVENOUS

## 2024-04-17 MED ORDER — MUPIROCIN 2 % EX OINT
1.0000 | TOPICAL_OINTMENT | Freq: Two times a day (BID) | CUTANEOUS | Status: AC
  Administered 2024-04-17 – 2024-04-21 (×10): 1 via NASAL
  Filled 2024-04-17: qty 22

## 2024-04-17 MED ORDER — FENTANYL CITRATE (PF) 100 MCG/2ML IJ SOLN
INTRAMUSCULAR | Status: AC
  Filled 2024-04-17: qty 2

## 2024-04-17 MED ORDER — LACTATED RINGERS IV SOLN: INTRAVENOUS | Status: DC

## 2024-04-17 MED ORDER — ONDANSETRON HCL 4 MG/2ML IJ SOLN
2.0000 mg | Freq: Once | INTRAMUSCULAR | Status: AC
  Administered 2024-04-17: 2 mg via INTRAVENOUS

## 2024-04-17 MED ORDER — LACTATED RINGERS IV SOLN: INTRAVENOUS | Status: DC | PRN

## 2024-04-17 MED ORDER — AMISULPRIDE (ANTIEMETIC) 5 MG/2ML IV SOLN
INTRAVENOUS | Status: AC
Start: 2024-04-17 — End: 2024-04-17
  Filled 2024-04-17: qty 4

## 2024-04-17 MED ORDER — PHENYLEPHRINE HCL-NACL 20-0.9 MG/250ML-% IV SOLN
INTRAVENOUS | Status: AC
  Filled 2024-04-17: qty 250

## 2024-04-17 MED ORDER — ROCURONIUM BROMIDE 10 MG/ML (PF) SYRINGE
PREFILLED_SYRINGE | INTRAVENOUS | Status: AC
  Filled 2024-04-17: qty 10

## 2024-04-17 MED ORDER — ONDANSETRON HCL 4 MG/2ML IJ SOLN
INTRAMUSCULAR | Status: DC | PRN
  Administered 2024-04-17: 4 mg via INTRAVENOUS

## 2024-04-17 MED ORDER — FENTANYL CITRATE (PF) 100 MCG/2ML IJ SOLN
INTRAMUSCULAR | Status: DC | PRN
  Administered 2024-04-17 (×2): 50 ug via INTRAVENOUS

## 2024-04-17 MED ORDER — ALVIMOPAN 12 MG PO CAPS
12.0000 mg | ORAL_CAPSULE | Freq: Two times a day (BID) | ORAL | Status: DC
  Administered 2024-04-18 – 2024-04-20 (×3): 12 mg via ORAL
  Filled 2024-04-17 (×5): qty 1

## 2024-04-17 MED ORDER — SUGAMMADEX SODIUM 200 MG/2ML IV SOLN
INTRAVENOUS | Status: AC
Start: 2024-04-17 — End: 2024-04-17
  Filled 2024-04-17: qty 2

## 2024-04-17 MED ORDER — FENTANYL CITRATE PF 50 MCG/ML IJ SOSY: 25.0000 ug | PREFILLED_SYRINGE | INTRAMUSCULAR | Status: DC | PRN

## 2024-04-17 MED ORDER — PHENYLEPHRINE 80 MCG/ML (10ML) SYRINGE FOR IV PUSH (FOR BLOOD PRESSURE SUPPORT)
PREFILLED_SYRINGE | INTRAVENOUS | Status: AC
  Filled 2024-04-17: qty 10

## 2024-04-17 MED ORDER — TRAMADOL HCL 50 MG PO TABS: 50.0000 mg | ORAL_TABLET | Freq: Four times a day (QID) | ORAL | Status: DC | PRN

## 2024-04-17 MED ORDER — SUGAMMADEX SODIUM 200 MG/2ML IV SOLN
INTRAVENOUS | Status: DC | PRN
  Administered 2024-04-17: 200 mg via INTRAVENOUS

## 2024-04-17 MED ORDER — OXYCODONE HCL 5 MG PO TABS
5.0000 mg | ORAL_TABLET | ORAL | Status: DC | PRN
  Administered 2024-04-18: 5 mg via ORAL
  Filled 2024-04-17: qty 2

## 2024-04-17 MED ORDER — DEXAMETHASONE SODIUM PHOSPHATE 10 MG/ML IJ SOLN
INTRAMUSCULAR | Status: AC
  Filled 2024-04-17: qty 1

## 2024-04-17 MED ORDER — LIDOCAINE HCL (PF) 2 % IJ SOLN
INTRAMUSCULAR | Status: AC
Start: 2024-04-17 — End: 2024-04-17
  Filled 2024-04-17: qty 5

## 2024-04-17 MED ORDER — PROPOFOL 10 MG/ML IV BOLUS
INTRAVENOUS | Status: DC | PRN
  Administered 2024-04-17: 30 mg via INTRAVENOUS
  Administered 2024-04-17: 100 mg via INTRAVENOUS

## 2024-04-17 MED ORDER — DEXAMETHASONE SODIUM PHOSPHATE 4 MG/ML IJ SOLN
INTRAMUSCULAR | Status: DC | PRN
  Administered 2024-04-17: 5 mg via INTRAVENOUS

## 2024-04-17 MED ORDER — PROPOFOL 10 MG/ML IV BOLUS
INTRAVENOUS | Status: AC
  Filled 2024-04-17: qty 20

## 2024-04-17 MED ORDER — PHENYLEPHRINE HCL-NACL 20-0.9 MG/250ML-% IV SOLN
INTRAVENOUS | Status: DC | PRN
  Administered 2024-04-17: 40 ug/min via INTRAVENOUS

## 2024-04-17 MED ORDER — 0.9 % SODIUM CHLORIDE (POUR BTL) OPTIME
TOPICAL | Status: DC | PRN
  Administered 2024-04-17: 2000 mL

## 2024-04-17 MED ORDER — LIDOCAINE HCL (CARDIAC) PF 100 MG/5ML IV SOSY
PREFILLED_SYRINGE | INTRAVENOUS | Status: DC | PRN
  Administered 2024-04-17: 50 mg via INTRAVENOUS

## 2024-04-17 MED ORDER — CHLORHEXIDINE GLUCONATE 0.12 % MT SOLN
15.0000 mL | Freq: Once | OROMUCOSAL | Status: AC
  Administered 2024-04-17: 15 mL via OROMUCOSAL

## 2024-04-17 MED ORDER — AMISULPRIDE (ANTIEMETIC) 5 MG/2ML IV SOLN
10.0000 mg | Freq: Once | INTRAVENOUS | Status: AC
  Administered 2024-04-17: 10 mg via INTRAVENOUS

## 2024-04-17 SURGICAL SUPPLY — 36 items
BAG COUNTER SPONGE SURGICOUNT (BAG) IMPLANT
BLADE EXTENDED COATED 6.5IN (ELECTRODE) IMPLANT
CHLORAPREP W/TINT 26 (MISCELLANEOUS) ×4 IMPLANT
COVER SURGICAL LIGHT HANDLE (MISCELLANEOUS) ×4 IMPLANT
DRSG OPSITE POSTOP 4X10 (GAUZE/BANDAGES/DRESSINGS) IMPLANT
DRSG OPSITE POSTOP 4X6 (GAUZE/BANDAGES/DRESSINGS) IMPLANT
DRSG OPSITE POSTOP 4X8 (GAUZE/BANDAGES/DRESSINGS) IMPLANT
ELECT REM PT RETURN 15FT ADLT (MISCELLANEOUS) ×2 IMPLANT
GAUZE PAD ABD 8X10 STRL (GAUZE/BANDAGES/DRESSINGS) IMPLANT
GAUZE SPONGE 4X4 12PLY STRL (GAUZE/BANDAGES/DRESSINGS) IMPLANT
GLOVE SURG ORTHO 8.0 STRL STRW (GLOVE) ×4 IMPLANT
GLOVE SURG SYN 7.5 PF PI (GLOVE) ×2 IMPLANT
GOWN STRL REUS W/ TWL XL LVL3 (GOWN DISPOSABLE) ×8 IMPLANT
HANDLE SUCTION POOLE (INSTRUMENTS) IMPLANT
KIT TURNOVER KIT A (KITS) ×2 IMPLANT
LIGASURE IMPACT 36 18CM CVD LR (INSTRUMENTS) IMPLANT
PACK COLON (CUSTOM PROCEDURE TRAY) ×2 IMPLANT
PENCIL SMOKE EVACUATOR (MISCELLANEOUS) IMPLANT
RELOAD STAPLE 75 3.8 BLU REG (ENDOMECHANICALS) IMPLANT
STAPLER 90 3.5 STD SLIM (STAPLE) IMPLANT
STAPLER PROXIMATE 75MM BLUE (STAPLE) IMPLANT
STAPLER SKIN PROX 35W (STAPLE) ×2 IMPLANT
SUT NOV 1 T60/GS (SUTURE) IMPLANT
SUT NOVA NAB DX-16 0-1 5-0 T12 (SUTURE) IMPLANT
SUT NOVA T20/GS 25 (SUTURE) IMPLANT
SUT PDS AB 1 TP1 96 (SUTURE) IMPLANT
SUT SILK 2 0 SH CR/8 (SUTURE) ×2 IMPLANT
SUT SILK 2 0SH CR/8 30 (SUTURE) IMPLANT
SUT SILK 2-0 18XBRD TIE 12 (SUTURE) ×2 IMPLANT
SUT SILK 2-0 30XBRD TIE 12 (SUTURE) ×2 IMPLANT
SUT SILK 3 0 SH CR/8 (SUTURE) ×2 IMPLANT
SUT SILK 3-0 18XBRD TIE 12 (SUTURE) ×2 IMPLANT
TOWEL OR 17X26 10 PK STRL BLUE (TOWEL DISPOSABLE) IMPLANT
TRAY FOLEY MTR SLVR 16FR STAT (SET/KITS/TRAYS/PACK) ×2 IMPLANT
TRAY FOLEY W/BAG SLVR 14FR LF (SET/KITS/TRAYS/PACK) IMPLANT
TUBING CONNECTING 10 (TUBING) ×4 IMPLANT

## 2024-04-17 NOTE — Transfer of Care (Signed)
 Immediate Anesthesia Transfer of Care Note  Patient: Melissa Knight  Procedure(s) Performed: EXPLORATORY LAPAROTOMY WITH TRANSVERSE COLECTOMY  Patient Location: PACU  Anesthesia Type:General  Level of Consciousness: sedated  Airway & Oxygen Therapy: Patient Spontanous Breathing and Patient connected to face mask oxygen  Post-op Assessment: Report given to RN and Post -op Vital signs reviewed and stable  Post vital signs: Reviewed and stable  Last Vitals:  Vitals Value Taken Time  BP 181/93 04/17/24 10:40  Temp    Pulse 59 04/17/24 10:41  Resp 21 04/17/24 10:41  SpO2 100 % 04/17/24 10:41  Vitals shown include unfiled device data.  Last Pain:  Vitals:   04/17/24 0736  TempSrc:   PainSc: 0-No pain         Complications: No notable events documented.

## 2024-04-17 NOTE — Plan of Care (Signed)
 PMT no charge note  Patient in OR today.  Op note reviewed Monitor hospital course PMT to monitor post op course and remains available for ongoing goals of care discussions.  No charge Lonia Serve MD [facility].

## 2024-04-17 NOTE — Op Note (Signed)
 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  Patient underwent colonoscopy with biopsy.  Pathology results are pending.  Patient now comes to surgery for resection.  Procedure:  The patient was seen in the pre-op holding area. The risks, benefits, complications, treatment options, and expected outcomes were previously discussed with the patient. The patient agreed with the proposed plan and has signed the informed consent form.  The patient was brought to the operating room by the surgical team, identified as Melissa Knight and the procedure verified. A time out was completed and the above information confirmed.  Following administration of general anesthesia, the patient is positioned and then prepped and draped in the usual aseptic fashion.  After ascertaining that an adequate level of anesthesia been achieved, a upper midline abdominal incision is made with a #10 blade.  Dissection is carried through subcutaneous tissues and hemostasis achieved with the electrocautery.  Fascia is incised in the midline and the peritoneal cavity is entered cautiously.  Peritoneal cavity is explored.  There is a mass in the mid transverse colon.  There is a colonoscopic tattoo approximately 10 cm distal to the mass.  Palpation of the remainder of the peritoneal cavity shows no evidence of metastatic disease.  Omentum is mobilized off of the transverse colon.  The transverse colon is mobilized proximally and distally without taking down the hepatic flexure or the splenic flexure.  There is moderate redundancy of the transverse colon  which will allow for resection and anastomosis.  A point approximately 15 cm proximal to the tumor is selected.  It is transected with a GIA stapler.  Mesentery is divided with the LigaSure.  A point approximately 15 cm distal to the tumor and just distal to the colonoscopic tattoo is selected and transected with the GIA stapler.  Again the mesentery is divided with the LigaSure.  Major vessels are clamped and divided with the LigaSure and then ligated with 2-0 silk ties.  The remainder of the mesentery is divided with the LigaSure and the specimen is excised.  The proximal margin is marked with a single suture.  The distal margin is marked with a double suture.  The entire specimen is submitted to pathology for review.  Next the proximal and distal transverse colon are prepared for anastomosis.  Overlying adipose tissue is cleared.  The colon is approximated with interrupted 2-0 silk sutures.  Colotomy is made in the proximal and distal segment and a GIA stapler is inserted, close, and fired.  This creates a common channel which shows good hemostasis.  Colotomy is closed with a TA 90 stapler.  Anastomosis appears to be widely patent and is not under any significant tension.  Mesenteric defect is closed with interrupted 2-0 silk sutures.  Abdomen is copiously irrigated with warm saline.  Remaining omentum is used to cover the anastomosis.  Saline is evacuated and good hemostasis is noted throughout the peritoneal cavity.  Midline abdominal incision is closed with interrupted #1 Novafil simple sutures.  Subcutaneous tissues are irrigated.  Skin edges are reapproximated with a stainless steel staples.  Wound was washed and dried  and a honeycomb dressing is applied.  Patient is awakened from anesthesia and transferred to the recovery room in stable condition.  The patient tolerated the procedure well.   Krystal Spinner, MD [facility] Surgery Office: [phone]

## 2024-04-17 NOTE — Progress Notes (Signed)
 Nutrition Follow-up  DOCUMENTATION CODES:   Non-severe (moderate) malnutrition in context of chronic illness  INTERVENTION:  - Clear Liquid diet once medically appropriate. - Boost Breeze po TID, each supplement provides 250 kcal and 9 grams of protein  - Would recommend Ensure Plus High Protein po BID once diet advanced past clear liquids. Each supplement provides 350 kcal and 20 grams of protein. - Encourage intake as tolerated.  - Monitor weight trends.   NUTRITION DIAGNOSIS:   Moderate Malnutrition related to chronic illness as evidenced by mild muscle depletion, percent weight loss (13% in 5.5 months). *ongoing  GOAL:   Patient will meet greater than or equal to 90% of their needs *not met  MONITOR:   PO intake, Supplement acceptance, Diet advancement, Labs, Weight trends  REASON FOR ASSESSMENT:   Malnutrition Screening Tool    ASSESSMENT:   89 y.o. female with PMH of permanent atrial fibrillation, HTN, hypothyroidism, embolic stroke who presented with complaints of abdominal pain with nausea vomiting and diarrhea for the last 3 days as well as significant weight loss. Admitted for colonic mass.  7/28 Admit 7/29 CLD 8/2 s/p colonoscopy, found to have partially obstructing tumor in the distal transverse colon suspicious for malignant ; Soft diet 8/4 CLD -> bowel prep 8/6 s/p transverse colectomy  Patient in OR this AM.  She has been on a soft diet since 8/2, documented to be consuming 40-75% of meals during that time.  She has been consuming Boost Breeze 1-3x/day.  She is currently NPO but clear liquid diet ordered pended this AM. Will follow for diet advancement and monitoring of oral intake.   Admit weight: 145# Current weight: 150# I&O's: +6.6L since admit   Medications reviewed and include: -   Labs reviewed:  K+ 3.2  Diet Order:   Diet Order             Diet NPO time specified Except for: Ice Chips, Sips with Meds  Diet effective ____                    EDUCATION NEEDS:  Education needs have been addressed  Skin:  Skin Assessment: Reviewed RN Assessment  Last BM:  8/5 - type 7  Height:  Ht Readings from Last 1 Encounters:  04/17/24 5' 2 (1.575 m)   Weight:  Wt Readings from Last 1 Encounters:  04/17/24 68 kg    BMI:  Body mass index is 27.44 kg/m.  Estimated Nutritional Needs:  Kcal:  1650-1950 kcals Protein:  85-100 grams Fluid:  >/= 1.7L    Trude Ned RD, LDN Contact via Secure Chat.

## 2024-04-17 NOTE — Progress Notes (Addendum)
 PHARMACY - ANTICOAGULATION CONSULT NOTE  Pharmacy Consult for heparin   Indication: hx atrial fibrillation and stroke (PTA warfarin on hold)  Allergies  Allergen Reactions   Sulfa Antibiotics Rash    rash    Patient Measurements: Height: 5' 2 (157.5 cm) Weight: 68 kg (150 lb) IBW/kg (Calculated) : 50.1 HEPARIN  DW (KG): 64.2  Vital Signs: Temp: 97.4 F (36.3 C) (08/06 1255) Temp Source: Oral (08/06 0730) BP: 139/66 (08/06 1255) Pulse Rate: 71 (08/06 1255)  Labs: Recent Labs    04/15/24 0329 04/15/24 1432 04/16/24 0328 04/16/24 1322 04/17/24 0318  HGB 11.4*  --  11.0*  --  10.3*  HCT 36.3  --  36.8  --  34.0*  PLT 199  --  186  --  213  LABPROT 17.7*  --  15.7*  --  14.4  INR 1.4*  --  1.2  --  1.1  HEPARINUNFRC 0.76* 0.57 0.72* 0.63  --   CREATININE  --   --   --   --  0.56    Estimated Creatinine Clearance: 43.1 mL/min (by C-G formula based on SCr of 0.56 mg/dL).   Medications:  - PTA warfarin regimen: 3.75 mg daily per office note on 03/25/24 (last dose taken on 7/26)   Assessment: Patient is an 89 y.o F with hx CVA and afib on warfarin PTA who presented to the ED on 04/08/24 with c/o abdominal pain and n/v/d.  Abdominal/pelvis CT on 04/08/24 showed colonic mass and lung nodules. Warfarin placed on hold on admission and INR reversed with vit K for colonoscopy procedure. She underwent colonoscopy on 8/2 and was noted to have a partially obstructing large mass in the distal transverse colon/ proximal descending colon with concern for malignancy.  Pharmacy has been consulted on 04/14/24 to bridge with heparin  drip in case surgery is needed for colon mass.   Significant events:  - 7/29: vit K 2.5 mg PO x1 - 7/30: vit K 10mg  PO x1 in AM  and 5mg  PO x1 in PM - 7/31: vit K 10 mg PO x1 - 8/2: colonoscopy  Today, 04/17/2024: - Patient underwent transverse colectomy 8/6. Per Dr.Gerkin, will restart heparin  infusion post operatively tonight at 2000.  - Hgb low stable, PLTc  stable   Goal of Therapy:  Heparin  level 0.3-0.7 units/ml Monitor platelets by anticoagulation protocol: Yes   Plan:  - Start heparin  infusion at 700 units/hr tonight at 2000 - Daily heparin  level and CBC - Monitor for s/sx bleeding/ episodes of hematuria - F/u for ability to transition back to warfarin  Thank you for allowing pharmacy to be a part of this patient's care.  Marget Hench, PharmD Clinical Pharmacist 8/6/20251:53 PM

## 2024-04-17 NOTE — Anesthesia Procedure Notes (Signed)
 Procedure Name: Intubation Date/Time: 04/17/2024 8:45 AM  Performed by: Buster Catheryn SAUNDERS, CRNAPre-anesthesia Checklist: Patient identified, Emergency Drugs available, Suction available and Patient being monitored Patient Re-evaluated:Patient Re-evaluated prior to induction Oxygen Delivery Method: Circle system utilized Preoxygenation: Pre-oxygenation with 100% oxygen Induction Type: IV induction Ventilation: Mask ventilation without difficulty Laryngoscope Size: Miller and 2 Grade View: Grade II Tube type: Oral Tube size: 7.0 mm Number of attempts: 1 Airway Equipment and Method: Stylet and Oral airway Placement Confirmation: ETT inserted through vocal cords under direct vision, positive ETCO2 and breath sounds checked- equal and bilateral Secured at: 21 cm Tube secured with: Tape Dental Injury: Teeth and Oropharynx as per pre-operative assessment

## 2024-04-17 NOTE — Anesthesia Postprocedure Evaluation (Signed)
 Anesthesia Post Note  Patient: Melissa Knight  Procedure(s) Performed: EXPLORATORY LAPAROTOMY WITH TRANSVERSE COLECTOMY     Patient location during evaluation: PACU Anesthesia Type: General Level of consciousness: awake and alert Pain management: pain level controlled Vital Signs Assessment: post-procedure vital signs reviewed and stable Respiratory status: spontaneous breathing, nonlabored ventilation, respiratory function stable and patient connected to nasal cannula oxygen Cardiovascular status: blood pressure returned to baseline and stable Postop Assessment: no apparent nausea or vomiting Anesthetic complications: no   No notable events documented.  Last Vitals:  Vitals:   04/17/24 1255 04/17/24 1459  BP: 139/66 135/69  Pulse: 71 65  Resp: 20 16  Temp: (!) 36.3 C 36.5 C  SpO2: 98% 98%    Last Pain:  Vitals:   04/17/24 1459  TempSrc: Oral  PainSc:                  Verdie Barrows D Lachrista Heslin

## 2024-04-17 NOTE — Plan of Care (Signed)

## 2024-04-17 NOTE — Progress Notes (Signed)
 PROGRESS NOTE    Melissa Knight  FMW:[id number] DOB: 31-Jan-1933 DOA: 04/08/2024 PCP: [name], FNP   Brief Narrative: 89 year old with past medical history significant for permanent A-fib, hypertension, hypothyroidism, embolic stroke presented to the ER complaining of abdominal pain, nausea vomiting and diarrhea since 3 days prior to admission.  She has lost at least 50 pounds over the last year.  In the ER CT abdomen and pelvis showed features concerning for colonic mass in the transverse colon concerning for colon cancer.  There is also lung nodules in the chest field concerning for possible metastasis.  Patient was admitted under hospital service, she underwent colonoscopy which showed near obstructing mass, general surgery consulted, patient underwent transverse colectomy by Dr. Krystal Budge on 8/6   Assessment & Plan:   Principal Problem:   Colonic mass Active Problems:   Seizure disorder, generalized convulsive, intractable (HCC)   Permanent atrial fibrillation (HCC)   Hypothyroidism (acquired)   Coagulopathy (HCC)   Hypokalemia   History of embolic stroke   Malnutrition of moderate degree   Palliative care encounter   Counseling and coordination of care   Goals of care, counseling/discussion  1-Colon mass, concerning for malignancy - Underwent colonoscopy 8/2 by [name], which showed partially obstructing mass with pinpoint opening in the distal transverse proximal descending colon.  Pathology transverse colon mass hyperplastic polyp negative for dysplasia, sigmoid colon polyp traditional serrated adenoma hyperplastic polyp. - General Surgery consulted, patient underwent transfer colectomy on 8/6. - Patient was evaluated by cardiology and felt to be high risk, patient understand risk and agreed to proceed with surgery. - CT chest was negative for metastatic disease. - Await surgical pathology Seen post op .  Appears to be stable.  History of paroxysmal A-fib,  initially with supratherapeutic INR. Continue metoprolol  Coumadin  held for upcoming surgery, she was bridged with heparin , resume heparin  when okay by general surgery  Acute normocytic anemia: The setting of malignancy With her hemoglobin  Essential hypertension: Continue amlodipine  and metoprolol  Continue to hold lisinopril  for now  History of stroke: Continue statin, hold Coumadin  pre op  Hypokalemia Replaced.   Acquired hypothyroidism: Continue with Synthroid  Seizure disorder: Continue with Keppra   UTI was rule out   Nutrition Problem: Moderate Malnutrition Etiology: chronic illness    Signs/Symptoms: mild muscle depletion, percent weight loss (13% in 5.5 months) Percent weight loss: 13 % (in 5.5 months)    Interventions: Refer to RD note for recommendations, Boost Breeze  Estimated body mass index is 27.44 kg/m as calculated from the following:   Height as of this encounter: 5' 2 (1.575 m).   Weight as of this encounter: 68 kg.   DVT prophylaxis: heparin   Code Status: Full code Family Communication: Disposition Plan:  Status is: Inpatient Remains inpatient appropriate because: colon mass    Consultants:  GI Surgery Cardiology   Procedures:  Colonoscopy   Antimicrobials:    Subjective: Patient seen post op. Nausea better. Denies worsening pain. Denies dyspnea.    Objective: Vitals:   04/17/24 1214 04/17/24 1215 04/17/24 1230 04/17/24 1255  BP:  (!) 148/69 137/75 139/66  Pulse:  70 69 71  Resp:  10 13 20   Temp: (!) 97.5 F (36.4 C)   (!) 97.4 F (36.3 C)  TempSrc:      SpO2:  97% 94% 98%  Weight:      Height:        Intake/Output Summary (Last 24 hours) at 04/17/2024 1312 Last data filed at 04/17/2024 1039 Gross  per 24 hour  Intake 1444.03 ml  Output 375 ml  Net 1069.03 ml   Filed Weights   04/08/24 1818 04/13/24 1032 04/17/24 0736  Weight: 65.8 kg 65.8 kg 68 kg    Examination:  General exam: Appears calm and comfortable   Respiratory system: Clear to auscultation. Respiratory effort normal. Cardiovascular system: S1 & S2 heard, RRR. No JVD, murmurs, rubs, gallops or clicks. No pedal edema. Gastrointestinal system: Abdomen is soft, mild tenderness, incision with clean dressing,  Central nervous system: Alert  Extremities: Symmetric 5 x 5 power.   Data Reviewed: I have personally reviewed following labs and imaging studies  CBC: Recent Labs  Lab 04/12/24 0315 04/14/24 1746 04/15/24 0329 04/16/24 0328 04/17/24 0318  WBC 5.1 7.7 7.4 11.7* 7.3  HGB 9.8* 11.9* 11.4* 11.0* 10.3*  HCT 32.2* 40.4 36.3 36.8 34.0*  MCV 93.1 95.7 93.1 94.6 93.4  PLT 172 201 199 186 213   Basic Metabolic Panel: Recent Labs  Lab 04/11/24 0317 04/12/24 0315 04/13/24 0356 04/14/24 0319 04/17/24 0318  NA 137 135 137 137 138  K 4.1 3.5 3.9 3.8 4.1  CL 104 106 108 106 103  CO2 22 23 20* 22 25  GLUCOSE 149* 85 99 120* 97  BUN <5* <5* <5* <5* 7*  CREATININE 0.55 0.86 0.84 0.52 0.56  CALCIUM  8.6* 8.2* 8.6* 8.5* 8.7*   GFR: Estimated Creatinine Clearance: 43.1 mL/min (by C-G formula based on SCr of 0.56 mg/dL). Liver Function Tests: No results for input(s): AST, ALT, ALKPHOS, BILITOT, PROT, ALBUMIN in the last 168 hours. No results for input(s): LIPASE, AMYLASE in the last 168 hours. No results for input(s): AMMONIA in the last 168 hours. Coagulation Profile: Recent Labs  Lab 04/13/24 0356 04/14/24 0319 04/15/24 0329 04/16/24 0328 04/17/24 0318  INR 1.6* 1.7* 1.4* 1.2 1.1   Cardiac Enzymes: No results for input(s): CKTOTAL, CKMB, CKMBINDEX, TROPONINI in the last 168 hours. BNP (last 3 results) No results for input(s): PROBNP in the last 8760 hours. HbA1C: No results for input(s): HGBA1C in the last 72 hours. CBG: No results for input(s): GLUCAP in the last 168 hours. Lipid Profile: No results for input(s): CHOL, HDL, LDLCALC, TRIG, CHOLHDL, LDLDIRECT in the last  72 hours. Thyroid  Function Tests: No results for input(s): TSH, T4TOTAL, FREET4, T3FREE, THYROIDAB in the last 72 hours. Anemia Panel: No results for input(s): VITAMINB12, FOLATE, FERRITIN, TIBC, IRON, RETICCTPCT in the last 72 hours. Sepsis Labs: No results for input(s): PROCALCITON, LATICACIDVEN in the last 168 hours.  Recent Results (from the past 240 hours)  C Difficile Quick Screen w PCR reflex     Status: None   Collection Time: 04/09/24  7:44 AM   Specimen: STOOL  Result Value Ref Range Status   C Diff antigen NEGATIVE NEGATIVE Final   C Diff toxin NEGATIVE NEGATIVE Final   C Diff interpretation No C. difficile detected.  Final    Comment: Performed at [facility], [address] [place], [place] [address]  Gastrointestinal Panel by PCR , Stool     Status: None   Collection Time: 04/09/24  7:44 AM   Specimen: Stool  Result Value Ref Range Status   Campylobacter species NOT DETECTED NOT DETECTED Final   Plesimonas shigelloides NOT DETECTED NOT DETECTED Final   Salmonella species NOT DETECTED NOT DETECTED Final   Yersinia enterocolitica NOT DETECTED NOT DETECTED Final   Vibrio species NOT DETECTED NOT DETECTED Final   Vibrio cholerae NOT DETECTED NOT DETECTED  Final   Enteroaggregative E coli (EAEC) NOT DETECTED NOT DETECTED Final   Enteropathogenic E coli (EPEC) NOT DETECTED NOT DETECTED Final   Enterotoxigenic E coli (ETEC) NOT DETECTED NOT DETECTED Final   Shiga like toxin producing E coli (STEC) NOT DETECTED NOT DETECTED Final   Shigella/Enteroinvasive E coli (EIEC) NOT DETECTED NOT DETECTED Final   Cryptosporidium NOT DETECTED NOT DETECTED Final   Cyclospora cayetanensis NOT DETECTED NOT DETECTED Final   Entamoeba histolytica NOT DETECTED NOT DETECTED Final   Giardia lamblia NOT DETECTED NOT DETECTED Final   Adenovirus F40/41 NOT DETECTED NOT DETECTED Final   Astrovirus NOT DETECTED NOT DETECTED Final   Norovirus GI/GII NOT  DETECTED NOT DETECTED Final   Rotavirus A NOT DETECTED NOT DETECTED Final   Sapovirus (I, II, IV, and V) NOT DETECTED NOT DETECTED Final    Comment: Performed at [facility], [address] [place], [place] [address]  Surgical pcr screen     Status: Abnormal   Collection Time: 04/16/24 11:30 PM   Specimen: Nasal Mucosa; Nasal Swab  Result Value Ref Range Status   MRSA, PCR NEGATIVE NEGATIVE Final   Staphylococcus aureus POSITIVE (A) NEGATIVE Final    Comment: (NOTE) The Xpert SA Assay (FDA approved for NASAL specimens in patients 64 years of age and older), is one component of a comprehensive surveillance program. It is not intended to diagnose infection nor to guide or monitor treatment. Performed at [facility], [address] [place], [place] [address]          Radiology Studies: ECHOCARDIOGRAM COMPLETE Result Date: 04/15/2024    ECHOCARDIOGRAM REPORT   Patient Name:   Leelah Luten Date of Exam: 04/15/2024 Medical Rec #:  [id number]    Height:       62.0 in Accession #:    [id number]   Weight:       145.1 lb Date of Birth:  17-Apr-1933     BSA:          1.668 m Patient Age:    89 years     BP:           135/67 mmHg Patient Gender: F            HR:           97 bpm. Exam Location:  Inpatient Procedure: 2D Echo, Color Doppler and Cardiac Doppler (Both Spectral and Color            Flow Doppler were utilized during procedure). Indications:    Atrial fibrillation  History:        Patient has no prior history of Echocardiogram examinations.                 Risk Factors:Hypertension and Dyslipidemia.  Sonographer:    Philomena Daring Referring Phys: [id number] HAO MENG IMPRESSIONS  1. Left ventricular ejection fraction, by estimation, is 60 to 65%. The left ventricle has normal function. The left ventricle has no regional wall motion abnormalities. Left ventricular diastolic function could not be evaluated.  2. Right ventricular systolic function is normal. The right  ventricular size is normal.  3. The mitral valve is normal in structure. Mild mitral valve regurgitation. No evidence of mitral stenosis.  4. The aortic valve is tricuspid. There is mild calcification of the aortic valve. Aortic valve regurgitation is not visualized. Aortic valve sclerosis/calcification is present, without any evidence of aortic stenosis.  5. The inferior vena cava is normal in size with greater  than 50% respiratory variability, suggesting right atrial pressure of 3 mmHg. FINDINGS  Left Ventricle: Left ventricular ejection fraction, by estimation, is 60 to 65%. The left ventricle has normal function. The left ventricle has no regional wall motion abnormalities. The left ventricular internal cavity size was normal in size. There is  no left ventricular hypertrophy. Left ventricular diastolic function could not be evaluated due to atrial fibrillation. Left ventricular diastolic function could not be evaluated. Right Ventricle: The right ventricular size is normal. No increase in right ventricular wall thickness. Right ventricular systolic function is normal. Left Atrium: Left atrial size was normal in size. Right Atrium: Right atrial size was normal in size. Pericardium: There is no evidence of pericardial effusion. Mitral Valve: The mitral valve is normal in structure. Mild mitral valve regurgitation. No evidence of mitral valve stenosis. Tricuspid Valve: The tricuspid valve is normal in structure. Tricuspid valve regurgitation is mild . No evidence of tricuspid stenosis. Aortic Valve: The aortic valve is tricuspid. There is mild calcification of the aortic valve. Aortic valve regurgitation is not visualized. Aortic valve sclerosis/calcification is present, without any evidence of aortic stenosis. Pulmonic Valve: The pulmonic valve was normal in structure. Pulmonic valve regurgitation is not visualized. No evidence of pulmonic stenosis. Aorta: The aortic root is normal in size and structure. Venous:  The inferior vena cava is normal in size with greater than 50% respiratory variability, suggesting right atrial pressure of 3 mmHg. IAS/Shunts: No atrial level shunt detected by color flow Doppler.  LEFT VENTRICLE PLAX 2D LVIDd:         3.50 cm     Diastology LVIDs:         2.80 cm     LV e' medial:    6.74 cm/s LV PW:         1.30 cm     LV E/e' medial:  12.5 LV IVS:        1.20 cm     LV e' lateral:   10.30 cm/s LVOT diam:     1.60 cm     LV E/e' lateral: 8.2 LV SV:         27 LV SV Index:   16 LVOT Area:     2.01 cm  LV Volumes (MOD) LV vol d, MOD A2C: 41.3 ml LV vol d, MOD A4C: 58.9 ml LV vol s, MOD A2C: 13.8 ml LV vol s, MOD A4C: 18.5 ml LV SV MOD A2C:     27.5 ml LV SV MOD A4C:     58.9 ml LV SV MOD BP:      33.8 ml RIGHT VENTRICLE             IVC RV S prime:     11.40 cm/s  IVC diam: 1.20 cm TAPSE (M-mode): 1.4 cm LEFT ATRIUM             Index        RIGHT ATRIUM           Index LA Vol (A2C):   38.7 ml 23.20 ml/m  RA Area:     16.10 cm LA Vol (A4C):   49.0 ml 29.38 ml/m  RA Volume:   36.00 ml  21.59 ml/m LA Biplane Vol: 45.0 ml 26.98 ml/m  AORTIC VALVE LVOT Vmax:   77.10 cm/s LVOT Vmean:  49.200 cm/s LVOT VTI:    0.132 m  AORTA Ao Root diam: 3.00 cm Ao Asc diam:  3.80 cm MITRAL VALVE  TRICUSPID VALVE MV Area (PHT): 3.49 cm    TR Peak grad:   24.8 mmHg MV E velocity: 84.40 cm/s  TR Vmax:        249.00 cm/s                             SHUNTS                            Systemic VTI:  0.13 m                            Systemic Diam: 1.60 cm Morene Brownie Electronically signed by Morene Brownie Signature Date/Time: 04/15/2024/5:49:34 PM    Final         Scheduled Meds:  NOREEN ON 04/18/2024] alvimopan   12 mg Oral BID   amLODipine   2.5 mg Oral Daily   atorvastatin   10 mg Oral q1800   feeding supplement  1 Container Oral TID BM   levETIRAcetam   500 mg Oral Daily   levothyroxine   88 mcg Oral Q0600   metoprolol  tartrate  100 mg Oral Daily   And   metoprolol  tartrate  50 mg Oral QHS    mupirocin  ointment  1 Application Nasal BID   Continuous Infusions:  sodium chloride        LOS: 8 days    Time spent: 35 minutes    Brynlee Pennywell A Tiajah Oyster, MD [facility]   If 7PM-7AM, please contact night-coverage [email or web address]  04/17/2024, 1:12 PM

## 2024-04-18 ENCOUNTER — Encounter (HOSPITAL_COMMUNITY): Payer: Self-pay | Admitting: Surgery

## 2024-04-18 DIAGNOSIS — I4821 Permanent atrial fibrillation: Secondary | ICD-10-CM | POA: Diagnosis not present

## 2024-04-18 DIAGNOSIS — K6389 Other specified diseases of intestine: Secondary | ICD-10-CM | POA: Diagnosis not present

## 2024-04-18 LAB — BASIC METABOLIC PANEL WITH GFR
Anion gap: 9 (ref 5–15)
BUN: 9 mg/dL (ref 8–23)
CO2: 22 mmol/L (ref 22–32)
Calcium: 8.2 mg/dL — ABNORMAL LOW (ref 8.9–10.3)
Chloride: 102 mmol/L (ref 98–111)
Creatinine, Ser: 0.73 mg/dL (ref 0.44–1.00)
GFR, Estimated: >60 mL/min (ref >60)
Glucose, Bld: 142 mg/dL — ABNORMAL HIGH (ref 70–99)
Potassium: 4.6 mmol/L (ref 3.5–5.1)
Sodium: 133 mmol/L — ABNORMAL LOW (ref 135–145)

## 2024-04-18 LAB — CBC
HCT: 33.6 % — ABNORMAL LOW (ref 36.0–46.0)
Hemoglobin: 10.7 g/dL — ABNORMAL LOW (ref 12.0–15.0)
MCH: 29.3 pg (ref 26.0–34.0)
MCHC: 31.8 g/dL (ref 30.0–36.0)
MCV: 92.1 fL (ref 80.0–100.0)
Platelets: 206 K/uL (ref 150–400)
RBC: 3.65 MIL/uL — ABNORMAL LOW (ref 3.87–5.11)
RDW: 14.2 % (ref 11.5–15.5)
WBC: 18.6 K/uL — ABNORMAL HIGH (ref 4.0–10.5)
nRBC: 0 % (ref 0.0–0.2)

## 2024-04-18 LAB — PROTIME-INR
INR: 1.2 (ref 0.8–1.2)
Prothrombin Time: 15.5 s — ABNORMAL HIGH (ref 11.4–15.2)

## 2024-04-18 LAB — HEPARIN LEVEL (UNFRACTIONATED)
Heparin Unfractionated: 0.22 [IU]/mL — ABNORMAL LOW (ref 0.30–0.70)
Heparin Unfractionated: 0.33 [IU]/mL (ref 0.30–0.70)

## 2024-04-18 MED ORDER — METHOCARBAMOL 500 MG PO TABS
500.0000 mg | ORAL_TABLET | Freq: Three times a day (TID) | ORAL | Status: DC
  Administered 2024-04-18 – 2024-04-24 (×26): 500 mg via ORAL
  Filled 2024-04-18 (×19): qty 1

## 2024-04-18 MED ORDER — ACETAMINOPHEN 500 MG PO TABS
1000.0000 mg | ORAL_TABLET | Freq: Four times a day (QID) | ORAL | Status: DC
  Administered 2024-04-18 – 2024-04-24 (×28): 1000 mg via ORAL
  Filled 2024-04-18 (×23): qty 2

## 2024-04-18 NOTE — Evaluation (Signed)
 Physical Therapy Evaluation Patient Details Name: Melissa Knight MRN: [id number] DOB: August 30, 1933 Today's Date: 04/18/2024  History of Present Illness  89 yo female admitted 04/08/24   complaining of abdominal pain, nausea vomiting and diarrhea. CT abdomen and pelvis showed features concerning for colonic mass in the transverse colon concerning for colon cancer.  S/P laparotomy with partial colectomy and primary anastomosis, Dr. Eletha 8/6. past medical history significant for permanent A-fib, hypertension, hypothyroidism, embolic stroke  Clinical Impression  Pt admitted with above diagnosis.  Pt currently with functional limitations due to the deficits listed below (see PT Problem List). Pt will benefit from acute skilled PT to increase their independence and safety with mobility to allow discharge.     The patient  eager to ambulate and pleased that she was able to ambulate in hall.  Patient should progress to return home. Patient reports family support. Patient's spouse is currently in rehab.       If plan is discharge home, recommend the following: A little help with bathing/dressing/bathroom;Assistance with cooking/housework;Assist for transportation;Help with stairs or ramp for entrance   Can travel by private vehicle        Equipment Recommendations Rolling walker (2 wheels)youth  Recommendations for Other Services       Functional Status Assessment Patient has had a recent decline in their functional status and demonstrates the ability to make significant improvements in function in a reasonable and predictable amount of time.     Precautions / Restrictions Precautions Precautions: Fall Precaution/Restrictions Comments: abd sg Restrictions Weight Bearing Restrictions Per Provider Order: No      Mobility  Bed Mobility               General bed mobility comments: in reclienr, return there    Transfers Overall transfer level: Needs assistance Equipment used: Rolling  walker (2 wheels) Transfers: Sit to/from Stand Sit to Stand: Supervision           General transfer comment: steady to stand from recliner    Ambulation/Gait Ambulation/Gait assistance: Contact guard assist Gait Distance (Feet): 90 Feet Assistive device: Rolling walker (2 wheels) Gait Pattern/deviations: Step-through pattern Gait velocity: decr     General Gait Details: gait steady with RW  Stairs            Wheelchair Mobility     Tilt Bed    Modified Rankin (Stroke Patients Only)       Balance Overall balance assessment: Mild deficits observed, not formally tested                                           Pertinent Vitals/Pain Pain Assessment Pain Assessment: Faces Faces Pain Scale: Hurts a little bit Pain Location: abd Pain Descriptors / Indicators: Discomfort Pain Intervention(s): Monitored during session    Home Living Family/patient expects to be discharged to:: Private residence Living Arrangements: Children;Spouse/significant other (spouse is inrehab) Available Help at Discharge: Family;Available 24 hours/day Type of Home: House Home Access: Stairs to enter Entrance Stairs-Rails: Left;Can reach Therapist, art of Steps: 4   Home Layout: One level Home Equipment: Cane - single point      Prior Function Prior Level of Function : Independent/Modified Independent;Driving             Mobility Comments: states recently started driving after not driving, spouse has not been able to drive, furniture cruises, uses cane when  out ADLs Comments: grandson takes to grocery store, independnet ADLs     Extremity/Trunk Assessment   Upper Extremity Assessment Upper Extremity Assessment: Overall WFL for tasks assessed    Lower Extremity Assessment Lower Extremity Assessment: Overall WFL for tasks assessed    Cervical / Trunk Assessment Cervical / Trunk Assessment: Normal;Other exceptions Cervical / Trunk  Exceptions: guarded from abd surg  Communication   Communication Communication: No apparent difficulties    Cognition Arousal: Alert Behavior During Therapy: WFL for tasks assessed/performed   PT - Cognitive impairments: No apparent impairments                         Following commands: Intact       Cueing       General Comments      Exercises     Assessment/Plan    PT Assessment Patient needs continued PT services  PT Problem List Decreased strength;Decreased mobility;Decreased activity tolerance;Pain       PT Treatment Interventions DME instruction;Therapeutic activities;Gait training;Therapeutic exercise;Patient/family education;Functional mobility training    PT Goals (Current goals can be found in the Care Plan section)  Acute Rehab PT Goals Patient Stated Goal: go home PT Goal Formulation: With patient Time For Goal Achievement: 05/02/24 Potential to Achieve Goals: Good    Frequency Min 3X/week     Co-evaluation               AM-PAC PT 6 Clicks Mobility  Outcome Measure Help needed turning from your back to your side while in a flat bed without using bedrails?: A Little Help needed moving from lying on your back to sitting on the side of a flat bed without using bedrails?: A Little Help needed moving to and from a bed to a chair (including a wheelchair)?: A Little Help needed standing up from a chair using your arms (e.g., wheelchair or bedside chair)?: A Little Help needed to walk in hospital room?: A Little Help needed climbing 3-5 steps with a railing? : A Little 6 Click Score: 18    End of Session Equipment Utilized During Treatment: Gait belt Activity Tolerance: Patient tolerated treatment well Patient left: in chair;with call bell/phone within reach;with chair alarm set Nurse Communication: Mobility status PT Visit Diagnosis: Unsteadiness on feet (R26.81);Difficulty in walking, not elsewhere classified (R26.2)    Time:  8880-8860 PT Time Calculation (min) (ACUTE ONLY): 20 min   Charges:   PT Evaluation $PT Eval Low Complexity: 1 Low   PT General Charges $$ ACUTE PT VISIT: 1 Visit         Darice Potters PT Acute Rehabilitation Services Office [phone]   Potters Darice Norris 04/18/2024, 12:41 PM

## 2024-04-18 NOTE — Progress Notes (Signed)
  Progress Note  Patient Name: Melissa Knight Date of Encounter: 04/18/2024 [facility] Cardiologist: None   Interval Summary   Post op day 1  Doing well, no acute complaints Remains in rate controlled A. Fib HR 60s  Vital Signs Vitals:   04/17/24 2245 04/18/24 0200 04/18/24 0521 04/18/24 0825  BP: 137/81 136/70 126/74 123/78  Pulse: 68 (!) 59 (!) 58 68  Resp: 16 19 18    Temp: 98.2 F (36.8 C) 98.4 F (36.9 C) 98.2 F (36.8 C)   TempSrc: Oral Oral Oral   SpO2: 96% 97% 97% 98%  Weight:      Height:        Intake/Output Summary (Last 24 hours) at 04/18/2024 0940 Last data filed at 04/18/2024 0600 Gross per 24 hour  Intake 2210.31 ml  Output 125 ml  Net 2085.31 ml      04/17/2024    7:36 AM 04/13/2024   10:32 AM 04/08/2024    6:18 PM  Last 3 Weights  Weight (lbs) 150 lb 145 lb 1 oz 145 lb  Weight (kg) 68.04 kg 65.8 kg 65.772 kg     Telemetry/ECG  N/A - Personally Reviewed  Physical Exam  GEN: No acute distress.   Neck: No JVD Cardiac: iRRR, no murmurs, rubs, or gallops.  Respiratory: Clear to auscultation bilaterally. GI: Soft, non-distended  MS: No edema  Assessment & Plan   Permanent atrial fibrillation Known history Last seen by cardiology service Jan 2018 On warfarin for [facility], managed by PCP  Not currently on telemetry, but rates have been in 60-70s Warfarin was held for surgery, bridged with heparin  Underwent transverse colectomy on 8/6  Patient reports doing well, no complaints Resume heparin  per general surgery  Currently on Lopressor  100 mg AM and 50 mg PM  Per primary Colonic mass History of stroke Hypertension Electrolyte disturbances Hypothyroidism Seizure disorder Anemia    For questions or updates, please contact [facility] Please consult [email or web address] for contact info under       Signed, Waddell DELENA Donath, PA-C

## 2024-04-18 NOTE — Progress Notes (Signed)
 Mobility Specialist - Progress Note   04/18/24 1523  Mobility  Activity Ambulated with assistance  Level of Assistance Contact guard assist, steadying assist  Assistive Device Front wheel walker  Distance Ambulated (ft) 90 ft  Range of Motion/Exercises Active  Activity Response Tolerated well  Mobility Referral Yes  Mobility visit 1 Mobility  Mobility Specialist Start Time (ACUTE ONLY) 1510  Mobility Specialist Stop Time (ACUTE ONLY) 1523  Mobility Specialist Time Calculation (min) (ACUTE ONLY) 13 min   Pt was found on recliner chair and agreeable to ambulate. No complaints. Returned to recliner chair with all needs met. Call bell in reach and chair alarm on.  Erminio Leos,  Mobility Specialist Can be reached via Secure Chat

## 2024-04-18 NOTE — Progress Notes (Signed)
 PHARMACY - ANTICOAGULATION CONSULT NOTE  Pharmacy Consult for heparin   Indication: hx atrial fibrillation and stroke (PTA warfarin on hold)  Allergies  Allergen Reactions   Sulfa Antibiotics Rash    rash    Patient Measurements: Height: 5' 2 (157.5 cm) Weight: 68 kg (150 lb) IBW/kg (Calculated) : 50.1 HEPARIN  DW (KG): 64.2  Vital Signs: Temp: 98.2 F (36.8 C) (08/07 0521) Temp Source: Oral (08/07 0521) BP: 123/78 (08/07 0825) Pulse Rate: 68 (08/07 0825)  Labs: Recent Labs    04/16/24 0328 04/16/24 1322 04/17/24 0318 04/18/24 0323  HGB 11.0*  --  10.3* 10.7*  HCT 36.8  --  34.0* 33.6*  PLT 186  --  213 206  LABPROT 15.7*  --  14.4 15.5*  INR 1.2  --  1.1 1.2  HEPARINUNFRC 0.72* 0.63  --  0.22*  CREATININE  --   --  0.56 0.73    Estimated Creatinine Clearance: 43.1 mL/min (by C-G formula based on SCr of 0.73 mg/dL).   Medications:  - PTA warfarin regimen: 3.75 mg daily per office note on 03/25/24 (last dose taken on 7/26)   Assessment: Patient is an 89 y.o F with hx CVA and afib on warfarin PTA who presented to the ED on 04/08/24 with c/o abdominal pain and n/v/d.  Abdominal/pelvis CT on 04/08/24 showed colonic mass and lung nodules. Warfarin placed on hold on admission and INR reversed with vit K for colonoscopy procedure. She underwent colonoscopy on 8/2 and was noted to have a partially obstructing large mass in the distal transverse colon/ proximal descending colon with concern for malignancy.  Pharmacy has been consulted on 04/14/24 to bridge with heparin  drip in case surgery is needed for colon mass.   Significant events:  - 7/29: vit K 2.5 mg PO x1 - 7/30: vit K 10mg  PO x1 in AM  and 5mg  PO x1 in PM - 7/31: vit K 10 mg PO x1 - 8/2: colonoscopy - 8/6: transverse colectomy  Today, 04/18/2024: - HL 0.33 therapeutic on 850 units/hr  - Hgb low stable, PLTc stable  - No interruptions with heparin  infusion or signs of bleeding per RN.  Goal of Therapy:  Heparin   level 0.3-0.7 units/ml Monitor platelets by anticoagulation protocol: Yes   Plan:  - Continue heparin  drip at 850 units/hr - Daily heparin  level and CBC - Monitor for s/sx bleeding/ episodes of hematuria - F/u for ability to transition back to warfarin  Thank you for allowing pharmacy to be a part of this patient's care.  Marget Hench, PharmD Clinical Pharmacist 8/7/20251:53 PM

## 2024-04-18 NOTE — Progress Notes (Signed)
 PHARMACY - ANTICOAGULATION CONSULT NOTE  Pharmacy Consult for heparin   Indication: hx atrial fibrillation and stroke (PTA warfarin on hold)  Allergies  Allergen Reactions   Sulfa Antibiotics Rash    rash    Patient Measurements: Height: 5' 2 (157.5 cm) Weight: 68 kg (150 lb) IBW/kg (Calculated) : 50.1 HEPARIN  DW (KG): 64.2  Vital Signs: Temp: 98.4 F (36.9 C) (08/07 0200) Temp Source: Oral (08/07 0200) BP: 136/70 (08/07 0200) Pulse Rate: 59 (08/07 0200)  Labs: Recent Labs    04/16/24 0328 04/16/24 1322 04/17/24 0318 04/18/24 0323  HGB 11.0*  --  10.3* 10.7*  HCT 36.8  --  34.0* 33.6*  PLT 186  --  213 206  LABPROT 15.7*  --  14.4  --   INR 1.2  --  1.1  --   HEPARINUNFRC 0.72* 0.63  --  0.22*  CREATININE  --   --  0.56 0.73    Estimated Creatinine Clearance: 43.1 mL/min (by C-G formula based on SCr of 0.73 mg/dL).   Medications:  - PTA warfarin regimen: 3.75 mg daily per office note on 03/25/24 (last dose taken on 7/26)   Assessment: Patient is an 89 y.o F with hx CVA and afib on warfarin PTA who presented to the ED on 04/08/24 with c/o abdominal pain and n/v/d.  Abdominal/pelvis CT on 04/08/24 showed colonic mass and lung nodules. Warfarin placed on hold on admission and INR reversed with vit K for colonoscopy procedure. She underwent colonoscopy on 8/2 and was noted to have a partially obstructing large mass in the distal transverse colon/ proximal descending colon with concern for malignancy.  Pharmacy has been consulted on 04/14/24 to bridge with heparin  drip in case surgery is needed for colon mass.   Significant events:  - 7/29: vit K 2.5 mg PO x1 - 7/30: vit K 10mg  PO x1 in AM  and 5mg  PO x1 in PM - 7/31: vit K 10 mg PO x1 - 8/2: colonoscopy  Today, 04/18/2024: - HL 0.22 subtherapeutic on 700 units/hr  - Hgb low stable, PLTc stable   Goal of Therapy:  Heparin  level 0.3-0.7 units/ml Monitor platelets by anticoagulation protocol: Yes   Plan:  - increase  heparin  drip to 850 units/hr - Heparin  level in 8 hours - Daily heparin  level and CBC - Monitor for s/sx bleeding/ episodes of hematuria - F/u for ability to transition back to warfarin  Thank you for allowing pharmacy to be a part of this patient's care.  Leeroy Mace RPh 04/18/2024, 4:41 AM

## 2024-04-18 NOTE — Progress Notes (Signed)
 PROGRESS NOTE    Melissa Knight  FMW:[id number] DOB: May 07, 1933 DOA: 04/08/2024 PCP: [name], FNP   Brief Narrative: 89 year old with past medical history significant for permanent A-fib, hypertension, hypothyroidism, embolic stroke presented to the ER complaining of abdominal pain, nausea vomiting and diarrhea since 3 days prior to admission.  She has lost at least 50 pounds over the last year.  In the ER CT abdomen and pelvis showed features concerning for colonic mass in the transverse colon concerning for colon cancer.  There is also lung nodules in the chest field concerning for possible metastasis.  Patient was admitted under hospital service, she underwent colonoscopy which showed near obstructing mass, general surgery consulted, patient underwent transverse colectomy by Dr. Krystal Budge on 8/6   Assessment & Plan:   Principal Problem:   Colonic mass Active Problems:   Seizure disorder, generalized convulsive, intractable (HCC)   Permanent atrial fibrillation (HCC)   Hypothyroidism (acquired)   Coagulopathy (HCC)   Hypokalemia   History of embolic stroke   Malnutrition of moderate degree   Palliative care encounter   Counseling and coordination of care   Goals of care, counseling/discussion  1-Colon mass, concerning for malignancy - Underwent colonoscopy 8/2 by [name], which showed partially obstructing mass with pinpoint opening in the distal transverse proximal descending colon.  Pathology transverse colon mass hyperplastic polyp negative for dysplasia, sigmoid colon polyp traditional serrated adenoma hyperplastic polyp. - General Surgery consulted, patient underwent transfer colectomy on 8/6. - Patient was evaluated by cardiology and felt to be high risk, patient understand risk and agreed to proceed with surgery. - CT chest was negative for metastatic disease. - Await surgical pathology -patient denies worsening pain, has not pass gas, tolerating clears.  -On  clear liquid, management per Sx -On Alvimopan   History of paroxysmal A-fib, initially with supratherapeutic INR. Continue metoprolol  Coumadin  held for upcoming surgery, she was bridged with heparin .  Heparin  resume 8/6 per surgery recommendations.   Acute normocytic anemia: The setting of malignancy Monitor hb,.    Essential hypertension: Continue amlodipine  and metoprolol  Continue to hold lisinopril  for now  History of stroke: Continue statin, hold Coumadin  pre op  Hypokalemia Replaced.   Acquired hypothyroidism: Continue with Synthroid  Seizure disorder: Continue with Keppra   UTI was rule out   Nutrition Problem: Moderate Malnutrition Etiology: chronic illness    Signs/Symptoms: mild muscle depletion, percent weight loss (13% in 5.5 months) Percent weight loss: 13 % (in 5.5 months)    Interventions: Refer to RD note for recommendations, Boost Breeze  Estimated body mass index is 27.44 kg/m as calculated from the following:   Height as of this encounter: 5' 2 (1.575 m).   Weight as of this encounter: 68 kg.   DVT prophylaxis: heparin   Code Status: Full code Family Communication: Disposition Plan:  Status is: Inpatient Remains inpatient appropriate because: colon mass    Consultants:  GI Surgery Cardiology   Procedures:  Colonoscopy   Antimicrobials:    Subjective: She is sitting recliner. Denies worsening abdominal pain.  Denies nausea.   Objective: Vitals:   04/17/24 1831 04/17/24 2245 04/18/24 0200 04/18/24 0521  BP: 135/77 137/81 136/70 126/74  Pulse: 65 68 (!) 59 (!) 58  Resp: 16 16 19 18   Temp: 99 F (37.2 C) 98.2 F (36.8 C) 98.4 F (36.9 C) 98.2 F (36.8 C)  TempSrc: Oral Oral Oral Oral  SpO2: 99% 96% 97% 97%  Weight:      Height:  Intake/Output Summary (Last 24 hours) at 04/18/2024 0722 Last data filed at 04/18/2024 0600 Gross per 24 hour  Intake 2210.31 ml  Output 125 ml  Net 2085.31 ml   Filed Weights   04/08/24  1818 04/13/24 1032 04/17/24 0736  Weight: 65.8 kg 65.8 kg 68 kg    Examination:  General exam: NAD Respiratory system: CTA Cardiovascular system: S 1, S 2 RRR Gastrointestinal system: BS decreased, soft, incision with clean dressing,  Central nervous system: alert, conversant, follows command Extremities: no edema   Data Reviewed: I have personally reviewed following labs and imaging studies  CBC: Recent Labs  Lab 04/14/24 1746 04/15/24 0329 04/16/24 0328 04/17/24 0318 04/18/24 0323  WBC 7.7 7.4 11.7* 7.3 18.6*  HGB 11.9* 11.4* 11.0* 10.3* 10.7*  HCT 40.4 36.3 36.8 34.0* 33.6*  MCV 95.7 93.1 94.6 93.4 92.1  PLT 201 199 186 213 206   Basic Metabolic Panel: Recent Labs  Lab 04/12/24 0315 04/13/24 0356 04/14/24 0319 04/17/24 0318 04/18/24 0323  NA 135 137 137 138 133*  K 3.5 3.9 3.8 4.1 4.6  CL 106 108 106 103 102  CO2 23 20* 22 25 22   GLUCOSE 85 99 120* 97 142*  BUN <5* <5* <5* 7* 9  CREATININE 0.86 0.84 0.52 0.56 0.73  CALCIUM  8.2* 8.6* 8.5* 8.7* 8.2*   GFR: Estimated Creatinine Clearance: 43.1 mL/min (by C-G formula based on SCr of 0.73 mg/dL). Liver Function Tests: No results for input(s): AST, ALT, ALKPHOS, BILITOT, PROT, ALBUMIN in the last 168 hours. No results for input(s): LIPASE, AMYLASE in the last 168 hours. No results for input(s): AMMONIA in the last 168 hours. Coagulation Profile: Recent Labs  Lab 04/14/24 0319 04/15/24 0329 04/16/24 0328 04/17/24 0318 04/18/24 0323  INR 1.7* 1.4* 1.2 1.1 1.2   Cardiac Enzymes: No results for input(s): CKTOTAL, CKMB, CKMBINDEX, TROPONINI in the last 168 hours. BNP (last 3 results) No results for input(s): PROBNP in the last 8760 hours. HbA1C: No results for input(s): HGBA1C in the last 72 hours. CBG: No results for input(s): GLUCAP in the last 168 hours. Lipid Profile: No results for input(s): CHOL, HDL, LDLCALC, TRIG, CHOLHDL, LDLDIRECT in the last 72  hours. Thyroid  Function Tests: No results for input(s): TSH, T4TOTAL, FREET4, T3FREE, THYROIDAB in the last 72 hours. Anemia Panel: No results for input(s): VITAMINB12, FOLATE, FERRITIN, TIBC, IRON, RETICCTPCT in the last 72 hours. Sepsis Labs: No results for input(s): PROCALCITON, LATICACIDVEN in the last 168 hours.  Recent Results (from the past 240 hours)  C Difficile Quick Screen w PCR reflex     Status: None   Collection Time: 04/09/24  7:44 AM   Specimen: STOOL  Result Value Ref Range Status   C Diff antigen NEGATIVE NEGATIVE Final   C Diff toxin NEGATIVE NEGATIVE Final   C Diff interpretation No C. difficile detected.  Final    Comment: Performed at [facility], [address] [place], [place] [address]  Gastrointestinal Panel by PCR , Stool     Status: None   Collection Time: 04/09/24  7:44 AM   Specimen: Stool  Result Value Ref Range Status   Campylobacter species NOT DETECTED NOT DETECTED Final   Plesimonas shigelloides NOT DETECTED NOT DETECTED Final   Salmonella species NOT DETECTED NOT DETECTED Final   Yersinia enterocolitica NOT DETECTED NOT DETECTED Final   Vibrio species NOT DETECTED NOT DETECTED Final   Vibrio cholerae NOT DETECTED NOT DETECTED Final   Enteroaggregative E coli (EAEC)  NOT DETECTED NOT DETECTED Final   Enteropathogenic E coli (EPEC) NOT DETECTED NOT DETECTED Final   Enterotoxigenic E coli (ETEC) NOT DETECTED NOT DETECTED Final   Shiga like toxin producing E coli (STEC) NOT DETECTED NOT DETECTED Final   Shigella/Enteroinvasive E coli (EIEC) NOT DETECTED NOT DETECTED Final   Cryptosporidium NOT DETECTED NOT DETECTED Final   Cyclospora cayetanensis NOT DETECTED NOT DETECTED Final   Entamoeba histolytica NOT DETECTED NOT DETECTED Final   Giardia lamblia NOT DETECTED NOT DETECTED Final   Adenovirus F40/41 NOT DETECTED NOT DETECTED Final   Astrovirus NOT DETECTED NOT DETECTED Final   Norovirus GI/GII NOT  DETECTED NOT DETECTED Final   Rotavirus A NOT DETECTED NOT DETECTED Final   Sapovirus (I, II, IV, and V) NOT DETECTED NOT DETECTED Final    Comment: Performed at [facility], [address] [place], [place] [address]  Surgical pcr screen     Status: Abnormal   Collection Time: 04/16/24 11:30 PM   Specimen: Nasal Mucosa; Nasal Swab  Result Value Ref Range Status   MRSA, PCR NEGATIVE NEGATIVE Final   Staphylococcus aureus POSITIVE (A) NEGATIVE Final    Comment: (NOTE) The Xpert SA Assay (FDA approved for NASAL specimens in patients 52 years of age and older), is one component of a comprehensive surveillance program. It is not intended to diagnose infection nor to guide or monitor treatment. Performed at [facility], [address], [place], [place] [address]          Radiology Studies: No results found.       Scheduled Meds:  alvimopan   12 mg Oral BID   amLODipine   2.5 mg Oral Daily   atorvastatin   10 mg Oral q1800   feeding supplement  1 Container Oral TID BM   levETIRAcetam   500 mg Oral Daily   levothyroxine   88 mcg Oral Q0600   metoprolol  tartrate  100 mg Oral Daily   And   metoprolol  tartrate  50 mg Oral QHS   mupirocin  ointment  1 Application Nasal BID   Continuous Infusions:  sodium chloride  75 mL/hr at 04/18/24 0615   heparin  850 Units/hr (04/18/24 0510)     LOS: 9 days    Time spent: 35 minutes    Elandra Powell A Demarkus Remmel, MD [facility]   If 7PM-7AM, please contact night-coverage [email or web address]  04/18/2024, 7:22 AM

## 2024-04-18 NOTE — Progress Notes (Signed)
 1 Day Post-Op  Subjective: Doing well today.  Sore, but pain controlled.  Sat up in the chair a while yesterday.  No flatus.  No nausea and drinking some CLD/breeze.    ROS: See above, otherwise other systems negative  Objective: Vital signs in last 24 hours: Temp:  [95.4 F (35.2 C)-99 F (37.2 C)] 98.2 F (36.8 C) (08/07 0521) Pulse Rate:  [58-73] 68 (08/07 0825) Resp:  [10-27] 18 (08/07 0521) BP: (123-181)/(66-93) 123/78 (08/07 0825) SpO2:  [94 %-100 %] 98 % (08/07 0825) Last BM Date : 04/16/24  Intake/Output from previous day: 08/06 0701 - 08/07 0700 In: 2210.3 [P.O.:300; I.V.:1810.3; IV Piggyback:100] Out: 125 [Urine:100; Blood:25] Intake/Output this shift: No intake/output data recorded.  PE: Abd: soft, appropriately tender, ND, midline incision with staples and honeycomb, some old drainage at inferior aspect.  Lab Results:  Recent Labs    04/17/24 0318 04/18/24 0323  WBC 7.3 18.6*  HGB 10.3* 10.7*  HCT 34.0* 33.6*  PLT 213 206   BMET Recent Labs    04/17/24 0318 04/18/24 0323  NA 138 133*  K 4.1 4.6  CL 103 102  CO2 25 22  GLUCOSE 97 142*  BUN 7* 9  CREATININE 0.56 0.73  CALCIUM  8.7* 8.2*   PT/INR Recent Labs    04/17/24 0318 04/18/24 0323  LABPROT 14.4 15.5*  INR 1.1 1.2   CMP     Component Value Date/Time   NA 133 (L) 04/18/2024 0323   NA 144 05/08/2023 1218   K 4.6 04/18/2024 0323   CL 102 04/18/2024 0323   CO2 22 04/18/2024 0323   GLUCOSE 142 (H) 04/18/2024 0323   BUN 9 04/18/2024 0323   BUN 12 05/08/2023 1218   CREATININE 0.73 04/18/2024 0323   CREATININE 0.69 01/30/2013 1117   CALCIUM  8.2 (L) 04/18/2024 0323   PROT 5.1 (L) 04/09/2024 0320   PROT 5.8 (L) 05/08/2023 1218   ALBUMIN 2.7 (L) 04/09/2024 0320   ALBUMIN 3.8 05/08/2023 1218   AST 14 (L) 04/09/2024 0320   ALT 9 04/09/2024 0320   ALKPHOS 27 (L) 04/09/2024 0320   BILITOT 1.0 04/09/2024 0320   BILITOT 0.5 05/08/2023 1218   GFRNONAA >60 04/18/2024 0323    GFRNONAA 84 01/30/2013 1117   GFRAA 89 11/03/2020 1243   GFRAA >89 01/30/2013 1117   Lipase     Component Value Date/Time   LIPASE 15 04/08/2024 1822       Studies/Results: No results found.  Anti-infectives: Anti-infectives (From admission, onward)    Start     Dose/Rate Route Frequency Ordered Stop   04/17/24 0600  cefoTEtan  (CEFOTAN ) 2 g in sodium chloride  0.9 % 100 mL IVPB        2 g 200 mL/hr over 30 Minutes Intravenous On call to O.R. 04/16/24 0724 04/17/24 1309   04/16/24 2200  neomycin  (MYCIFRADIN ) tablet 1,000 mg       Placed in And Linked Group   1,000 mg Oral  Once 04/16/24 2047 04/16/24 2124   04/16/24 2200  metroNIDAZOLE  (FLAGYL ) tablet 1,000 mg       Placed in And Linked Group   1,000 mg Oral  Once 04/16/24 2047 04/16/24 2124   04/16/24 1400  neomycin  (MYCIFRADIN ) tablet 1,000 mg  Status:  Discontinued       Placed in And Linked Group   1,000 mg Oral 3 times per day 04/16/24 0724 04/16/24 0725   04/16/24 1400  metroNIDAZOLE  (FLAGYL ) tablet 1,000 mg  Status:  Discontinued       Placed in And Linked Group   1,000 mg Oral 3 times per day 04/16/24 0724 04/16/24 0725   04/09/24 0430  cefTRIAXone  (ROCEPHIN ) 1 g in sodium chloride  0.9 % 100 mL IVPB  Status:  Discontinued        1 g 200 mL/hr over 30 Minutes Intravenous Every 24 hours 04/09/24 0340 04/09/24 1432        Assessment/Plan POD 1, s/p laparotomy with partial colectomy and primary anastomosis, Dr. Eletha 8/6 -path pending -multimodal pain control -CLD, await bowel function -cont entereg  -pt eval today, mobilize -IS/pulm toilet -WBC up to 18 from 7 pre-op.  Not unexpected post op due to inflammatory reaction.  Should come down nicely, but will monitor.  Hgb stable, heparin  has been resumed   FEN - CLD/IVFs per TRH VTE - heparin  gtt ID - none currently needed  H/o CVA A fib HTN HLD hypothyroidism    LOS: 9 days    Melissa Knight Banter , [facility] Surgery 04/18/2024, 9:14  AM Please see Amion for pager number during day hours 7:00am-4:30pm or 7:00am -11:30am on weekends

## 2024-04-19 ENCOUNTER — Ambulatory Visit: Payer: Self-pay | Admitting: Surgery

## 2024-04-19 ENCOUNTER — Other Ambulatory Visit: Payer: Self-pay | Admitting: *Deleted

## 2024-04-19 DIAGNOSIS — I4821 Permanent atrial fibrillation: Secondary | ICD-10-CM | POA: Diagnosis not present

## 2024-04-19 DIAGNOSIS — K6389 Other specified diseases of intestine: Secondary | ICD-10-CM

## 2024-04-19 LAB — HEPARIN LEVEL (UNFRACTIONATED)
Heparin Unfractionated: 0.69 [IU]/mL (ref 0.30–0.70)
Heparin Unfractionated: 0.36 [IU]/mL (ref 0.30–0.70)

## 2024-04-19 LAB — CBC
HCT: 28.2 % — ABNORMAL LOW (ref 36.0–46.0)
Hemoglobin: 8.9 g/dL — ABNORMAL LOW (ref 12.0–15.0)
MCH: 29.5 pg (ref 26.0–34.0)
MCHC: 31.6 g/dL (ref 30.0–36.0)
MCV: 93.4 fL (ref 80.0–100.0)
Platelets: 165 K/uL (ref 150–400)
RBC: 3.02 MIL/uL — ABNORMAL LOW (ref 3.87–5.11)
RDW: 14.6 % (ref 11.5–15.5)
WBC: 10.6 K/uL — ABNORMAL HIGH (ref 4.0–10.5)
nRBC: 0 % (ref 0.0–0.2)

## 2024-04-19 LAB — BASIC METABOLIC PANEL WITH GFR
Anion gap: 8 (ref 5–15)
BUN: 9 mg/dL (ref 8–23)
CO2: 22 mmol/L (ref 22–32)
Calcium: 7.9 mg/dL — ABNORMAL LOW (ref 8.9–10.3)
Chloride: 104 mmol/L (ref 98–111)
Creatinine, Ser: 0.72 mg/dL (ref 0.44–1.00)
GFR, Estimated: >60 mL/min (ref >60)
Glucose, Bld: 98 mg/dL (ref 70–99)
Potassium: 3.8 mmol/L (ref 3.5–5.1)
Sodium: 134 mmol/L — ABNORMAL LOW (ref 135–145)

## 2024-04-19 LAB — PROTIME-INR
INR: 1.2 (ref 0.8–1.2)
Prothrombin Time: 16.2 s — ABNORMAL HIGH (ref 11.4–15.2)

## 2024-04-19 MED ORDER — WARFARIN - PHARMACIST DOSING INPATIENT: Freq: Every day | Status: DC

## 2024-04-19 MED ORDER — WARFARIN SODIUM 2.5 MG PO TABS
3.7500 mg | ORAL_TABLET | Freq: Once | ORAL | Status: DC
  Filled 2024-04-19: qty 1

## 2024-04-19 MED ORDER — WARFARIN SODIUM 4 MG PO TABS
4.0000 mg | ORAL_TABLET | Freq: Once | ORAL | Status: AC
  Administered 2024-04-19: 4 mg via ORAL
  Filled 2024-04-19: qty 1

## 2024-04-19 MED ORDER — ENSURE PLUS HIGH PROTEIN PO LIQD
237.0000 mL | Freq: Two times a day (BID) | ORAL | Status: DC
  Administered 2024-04-19 – 2024-04-21 (×4): 237 mL via ORAL

## 2024-04-19 MED ORDER — PANTOPRAZOLE SODIUM 40 MG IV SOLR
40.0000 mg | Freq: Once | INTRAVENOUS | Status: AC
  Administered 2024-04-19: 40 mg via INTRAVENOUS
  Filled 2024-04-19: qty 10

## 2024-04-19 NOTE — Progress Notes (Signed)
 2 Days Post-Op  Subjective: Doing well today.  Some flatus, no BM.  No nausea.  Tired of the CLD.  Ambulated 2x around the unit yesterday.  Discussed pathology report with her and oncology referral.  Pulling 1000 on IS  Objective: Vital signs in last 24 hours: Temp:  [97.8 F (36.6 C)-98.4 F (36.9 C)] 98.4 F (36.9 C) (08/08 0556) Pulse Rate:  [59-62] 62 (08/08 0556) Resp:  [15-18] 15 (08/08 0556) BP: (109-128)/(55-67) 109/55 (08/08 0556) SpO2:  [96 %-98 %] 96 % (08/08 0556) Last BM Date : 04/16/24  Intake/Output from previous day: 08/07 0701 - 08/08 0700 In: 2390.5 [P.O.:450; I.V.:1940.5] Out: 0  Intake/Output this shift: Total I/O In: 99.8 [I.V.:99.8] Out: -   PE: Abd: soft, appropriately tender, ND, midline incision with staples and honeycomb, some old drainage at inferior aspect.  Great BS  Lab Results:  Recent Labs    04/18/24 0323 04/19/24 0316  WBC 18.6* 10.6*  HGB 10.7* 8.9*  HCT 33.6* 28.2*  PLT 206 165   BMET Recent Labs    04/18/24 0323 04/19/24 0316  NA 133* 134*  K 4.6 3.8  CL 102 104  CO2 22 22  GLUCOSE 142* 98  BUN 9 9  CREATININE 0.73 0.72  CALCIUM  8.2* 7.9*   PT/INR Recent Labs    04/18/24 0323 04/19/24 0316  LABPROT 15.5* 16.2*  INR 1.2 1.2   CMP     Component Value Date/Time   NA 134 (L) 04/19/2024 0316   NA 144 05/08/2023 1218   K 3.8 04/19/2024 0316   CL 104 04/19/2024 0316   CO2 22 04/19/2024 0316   GLUCOSE 98 04/19/2024 0316   BUN 9 04/19/2024 0316   BUN 12 05/08/2023 1218   CREATININE 0.72 04/19/2024 0316   CREATININE 0.69 01/30/2013 1117   CALCIUM  7.9 (L) 04/19/2024 0316   PROT 5.1 (L) 04/09/2024 0320   PROT 5.8 (L) 05/08/2023 1218   ALBUMIN 2.7 (L) 04/09/2024 0320   ALBUMIN 3.8 05/08/2023 1218   AST 14 (L) 04/09/2024 0320   ALT 9 04/09/2024 0320   ALKPHOS 27 (L) 04/09/2024 0320   BILITOT 1.0 04/09/2024 0320   BILITOT 0.5 05/08/2023 1218   GFRNONAA >60 04/19/2024 0316   GFRNONAA 84 01/30/2013 1117    GFRAA 89 11/03/2020 1243   GFRAA >89 01/30/2013 1117   Lipase     Component Value Date/Time   LIPASE 15 04/08/2024 1822       Studies/Results: No results found.  Anti-infectives: Anti-infectives (From admission, onward)    Start     Dose/Rate Route Frequency Ordered Stop   04/17/24 0600  cefoTEtan  (CEFOTAN ) 2 g in sodium chloride  0.9 % 100 mL IVPB        2 g 200 mL/hr over 30 Minutes Intravenous On call to O.R. 04/16/24 0724 04/17/24 1309   04/16/24 2200  neomycin  (MYCIFRADIN ) tablet 1,000 mg       Placed in And Linked Group   1,000 mg Oral  Once 04/16/24 2047 04/16/24 2124   04/16/24 2200  metroNIDAZOLE  (FLAGYL ) tablet 1,000 mg       Placed in And Linked Group   1,000 mg Oral  Once 04/16/24 2047 04/16/24 2124   04/16/24 1400  neomycin  (MYCIFRADIN ) tablet 1,000 mg  Status:  Discontinued       Placed in And Linked Group   1,000 mg Oral 3 times per day 04/16/24 0724 04/16/24 0725   04/16/24 1400  metroNIDAZOLE  (FLAGYL ) tablet 1,000  mg  Status:  Discontinued       Placed in And Linked Group   1,000 mg Oral 3 times per day 04/16/24 0724 04/16/24 0725   04/09/24 0430  cefTRIAXone  (ROCEPHIN ) 1 g in sodium chloride  0.9 % 100 mL IVPB  Status:  Discontinued        1 g 200 mL/hr over 30 Minutes Intravenous Every 24 hours 04/09/24 0340 04/09/24 1432        Assessment/Plan POD 2, s/p laparotomy with partial colectomy and primary anastomosis, Dr. Eletha 8/6 -path with adenocarcinoma with negative margins and 2/20 LNs with mets.  I have spoken to Dr. Cloretta who is happy to see her.  His office will arrange her follow up. -multimodal pain control -FLD and await further bowel function -cont entereg  -pt rec HH PT, mobilize -IS/pulm toilet -WBC 10K today.  Hgb 8.9 from 10.7.  monitor, cbc in am  FEN - FLD/IVFs per TRH VTE - heparin  gtt ID - none currently needed  H/o CVA A fib HTN HLD hypothyroidism    LOS: 10 days    Burnard FORBES Banter , [facility]  Surgery 04/19/2024, 9:09 AM Please see Amion for pager number during day hours 7:00am-4:30pm or 7:00am -11:30am on weekends

## 2024-04-19 NOTE — Progress Notes (Signed)
 Physical Therapy Treatment Patient Details Name: Melissa Knight MRN: [id number] DOB: 1932-09-27 Today's Date: 04/19/2024   History of Present Illness 89 yo female admitted 04/08/24   complaining of abdominal pain, nausea vomiting and diarrhea. CT abdomen and pelvis showed features concerning for colonic mass in the transverse colon concerning for colon cancer.  S/P laparotomy with partial colectomy and primary anastomosis, Dr. Eletha 8/6. past medical history significant for permanent A-fib, hypertension, hypothyroidism, embolic stroke    PT Comments   Pt admitted with above diagnosis.  Pt currently with functional limitations due to the deficits listed below (see PT Problem List). Pt seated in recliner when PT arrived. Pt motivated to participate with therapy. Pt is S for sit to stand  from recliner and BSC with RW or SPC (per PLOF).  Gait in hallway with RW slight trunk flexion and min cues with S 110 feet and short amb bouts 15 x 2 in personal room with [facility] with CGA and min cues. Pt indicated abdominal soreness along incision line no real pain. Pt had episode of loose and uncontrolled BM toward end of therapy session. Pt indicated that she had an episode this am as well. Pt expressed concern per d/c home alone if something like this were to happen she would not be able to manage. Pt required A for hygiene and for donning/doffing gown and socks. Pt states she has family close and supportive neighbors and spouse is currently SNF for rehab. PT updated d/c plan at this time to continued inpatient follow up therapy, <3 hours/day, however may change as pt progresses with therapy in hospital. Pt left seated in recliner and all needs in place. Pt will benefit from acute skilled PT to increase their independence and safety with mobility to allow discharge.      If plan is discharge home, recommend the following: A little help with bathing/dressing/bathroom;Assistance with cooking/housework;Assist for  transportation;Help with stairs or ramp for entrance   Can travel by private vehicle     Yes  Equipment Recommendations  Rolling walker (2 wheels)    Recommendations for Other Services       Precautions / Restrictions Precautions Precautions: Fall Precaution/Restrictions Comments: abd sg Restrictions Weight Bearing Restrictions Per Provider Order: No     Mobility  Bed Mobility               General bed mobility comments: pt seated in recliner and returned to recliner    Transfers Overall transfer level: Needs assistance Equipment used: Rolling walker (2 wheels) Transfers: Sit to/from Stand Sit to Stand: Supervision           General transfer comment: steady to stand from reclinerand [facility] and pt able to push to stand, transfers assessed with RW and [facility]    Ambulation/Gait Ambulation/Gait assistance: Contact guard assist, Supervision Gait Distance (Feet): 110 Feet Assistive device: Rolling walker (2 wheels) Gait Pattern/deviations: Step-through pattern, Trunk flexed Gait velocity: decreased     General Gait Details: gait steady with RW and slight trunk flexion with pt indicating just using the RW for balance, trial with SPC for gait assessment in personal room with no overt LOB and min cues   Stairs             Wheelchair Mobility     Tilt Bed    Modified Rankin (Stroke Patients Only)       Balance Overall balance assessment: Mild deficits observed, not formally tested  Communication Communication Communication: No apparent difficulties  Cognition Arousal: Alert Behavior During Therapy: WFL for tasks assessed/performed   PT - Cognitive impairments: No apparent impairments                         Following commands: Intact      Cueing    Exercises      General Comments        Pertinent Vitals/Pain Pain Assessment Pain Assessment: 0-10 Pain Score: 0-No  pain Pain Location: abd Pain Descriptors / Indicators: Discomfort, Sore Pain Intervention(s): Limited activity within patient's tolerance, Monitored during session    Home Living                          Prior Function            PT Goals (current goals can now be found in the care plan section) Acute Rehab PT Goals Patient Stated Goal: go home PT Goal Formulation: With patient Time For Goal Achievement: 05/02/24 Potential to Achieve Goals: Good Progress towards PT goals: Progressing toward goals    Frequency    Min 3X/week      PT Plan      Co-evaluation              AM-PAC PT 6 Clicks Mobility   Outcome Measure  Help needed turning from your back to your side while in a flat bed without using bedrails?: A Little Help needed moving from lying on your back to sitting on the side of a flat bed without using bedrails?: A Little Help needed moving to and from a bed to a chair (including a wheelchair)?: A Little Help needed standing up from a chair using your arms (e.g., wheelchair or bedside chair)?: A Little Help needed to walk in hospital room?: A Little Help needed climbing 3-5 steps with a railing? : A Little 6 Click Score: 18    End of Session Equipment Utilized During Treatment: Gait belt Activity Tolerance: Patient tolerated treatment well Patient left: in chair;with call bell/phone within reach;with chair alarm set Nurse Communication: Mobility status PT Visit Diagnosis: Unsteadiness on feet (R26.81);Difficulty in walking, not elsewhere classified (R26.2)     Time: 8752-8685 PT Time Calculation (min) (ACUTE ONLY): 27 min  Charges:                            [place], PT Acute Rehab    [place] VEAR Drone 04/19/2024, 3:49 PM

## 2024-04-19 NOTE — Progress Notes (Signed)
 PROGRESS NOTE    Melissa Knight  FMW:[id number] DOB: June 04, 1933 DOA: 04/08/2024 PCP: [name], FNP   Brief Narrative: 89 year old with past medical history significant for permanent A-fib, hypertension, hypothyroidism, embolic stroke presented to the ER complaining of abdominal pain, nausea vomiting and diarrhea since 3 days prior to admission.  She has lost at least 50 pounds over the last year.  In the ER CT abdomen and pelvis showed features concerning for colonic mass in the transverse colon concerning for colon cancer.  There is also lung nodules in the chest field concerning for possible metastasis.  Patient was admitted under hospital service, she underwent colonoscopy which showed near obstructing mass, general surgery consulted, patient underwent transverse colectomy by Dr. 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  - Underwent colonoscopy 8/2 by [name], which showed partially obstructing mass with pinpoint opening in the distal transverse proximal descending colon.  Pathology transverse colon mass hyperplastic polyp negative for dysplasia, sigmoid colon polyp traditional serrated adenoma hyperplastic polyp. - General Surgery consulted, patient underwent transfer colectomy on 8/6. - Patient was evaluated by cardiology and felt to be high risk, patient understand risk and agreed to proceed with surgery. - CT chest was negative for metastatic disease. - Await surgical pathology -Patient denies worsening pain,had BM.  -Management per  Sx -On Alvimopan  Diet advance to full liquid.   History of paroxysmal A-fib, initially with supratherapeutic INR. Continue metoprolol  Coumadin  held for upcoming surgery, she was bridged with heparin .  Heparin  resume 8/6 per surgery recommendations.  Resume Coumadin  if ok by surgery   Acute normocytic anemia: The setting of malignancy Monitor hb,. Trending down 10---8.9   Essential hypertension: Continue amlodipine  and metoprolol  Continue to hold lisinopril  for now  History of stroke: Continue statin, hold Coumadin  pre op  Hypokalemia Replaced.   Acquired hypothyroidism: Continue with Synthroid  Seizure disorder: Continue with Keppra   UTI was rule out   Nutrition Problem: Moderate Malnutrition Etiology: chronic illness    Signs/Symptoms: mild muscle depletion, percent weight loss (13% in 5.5 months) Percent weight loss: 13 % (in 5.5 months)    Interventions: Refer to RD note for recommendations, Boost Breeze  Estimated body mass index is 27.44 kg/m as calculated from the following:   Height as of this encounter: 5' 2 (1.575 m).   Weight as of this encounter: 68 kg.   DVT prophylaxis: heparin   Code Status: Full code Family Communication: Disposition Plan:  Status is: Inpatient Remains inpatient appropriate because: colon mass    Consultants:  GI Surgery Cardiology   Procedures:  Colonoscopy   Antimicrobials:    Subjective: She is feeling ok, had BM today. Denies nausea.    Objective: Vitals:   04/18/24 0825 04/18/24 1420 04/18/24 1942 04/19/24 0556  BP: 123/78 128/67 (!) 117/58 (!) 109/55  Pulse: 68 (!) 59 60 62  Resp:  18 17 15   Temp:  98 F (36.7 C) 97.8 F (36.6 C) 98.4 F (36.9 C)  TempSrc:  Oral Oral Oral  SpO2: 98% 98% 97% 96%  Weight:      Height:  Intake/Output Summary (Last 24 hours) at 04/19/2024 0710 Last data filed at 04/19/2024 0600 Gross per 24 hour  Intake 2390.53 ml  Output 0 ml  Net 2390.53 ml   Filed  Weights   04/08/24 1818 04/13/24 1032 04/17/24 0736  Weight: 65.8 kg 65.8 kg 68 kg    Examination:  General exam: NAD Respiratory system: CTA Cardiovascular system: S 1, S 2 RRR Gastrointestinal system: BS present, soft, nt  incision with clean dressing,  Central nervous system: alert Extremities: no edema   Data Reviewed: I have personally reviewed following labs and imaging studies  CBC: Recent Labs  Lab 04/15/24 0329 04/16/24 0328 04/17/24 0318 04/18/24 0323 04/19/24 0316  WBC 7.4 11.7* 7.3 18.6* 10.6*  HGB 11.4* 11.0* 10.3* 10.7* 8.9*  HCT 36.3 36.8 34.0* 33.6* 28.2*  MCV 93.1 94.6 93.4 92.1 93.4  PLT 199 186 213 206 165   Basic Metabolic Panel: Recent Labs  Lab 04/13/24 0356 04/14/24 0319 04/17/24 0318 04/18/24 0323 04/19/24 0316  NA 137 137 138 133* 134*  K 3.9 3.8 4.1 4.6 3.8  CL 108 106 103 102 104  CO2 20* 22 25 22 22   GLUCOSE 99 120* 97 142* 98  BUN <5* <5* 7* 9 9  CREATININE 0.84 0.52 0.56 0.73 0.72  CALCIUM  8.6* 8.5* 8.7* 8.2* 7.9*   GFR: Estimated Creatinine Clearance: 43.1 mL/min (by C-G formula based on SCr of 0.72 mg/dL). Liver Function Tests: No results for input(s): AST, ALT, ALKPHOS, BILITOT, PROT, ALBUMIN in the last 168 hours. No results for input(s): LIPASE, AMYLASE in the last 168 hours. No results for input(s): AMMONIA in the last 168 hours. Coagulation Profile: Recent Labs  Lab 04/15/24 0329 04/16/24 0328 04/17/24 0318 04/18/24 0323 04/19/24 0316  INR 1.4* 1.2 1.1 1.2 1.2   Cardiac Enzymes: No results for input(s): CKTOTAL, CKMB, CKMBINDEX, TROPONINI in the last 168 hours. BNP (last 3 results) No results for input(s): PROBNP in the last 8760 hours. HbA1C: No results for input(s): HGBA1C in the last 72 hours. CBG: No results for input(s): GLUCAP in the last 168 hours. Lipid Profile: No results for input(s): CHOL, HDL, LDLCALC, TRIG, CHOLHDL, LDLDIRECT in the last 72  hours. Thyroid  Function Tests: No results for input(s): TSH, T4TOTAL, FREET4, T3FREE, THYROIDAB in the last 72 hours. Anemia Panel: No results for input(s): VITAMINB12, FOLATE, FERRITIN, TIBC, IRON, RETICCTPCT in the last 72 hours. Sepsis Labs: No results for input(s): PROCALCITON, LATICACIDVEN in the last 168 hours.  Recent Results (from the past 240 hours)  C Difficile Quick Screen w PCR reflex     Status: None   Collection Time: 04/09/24  7:44 AM   Specimen: STOOL  Result Value Ref Range Status   C Diff antigen NEGATIVE NEGATIVE Final   C Diff toxin NEGATIVE NEGATIVE Final   C Diff interpretation No C. difficile detected.  Final    Comment: Performed at [facility], [address] [place], [place] [address]  Gastrointestinal Panel by PCR , Stool     Status: None   Collection Time: 04/09/24  7:44 AM   Specimen: Stool  Result Value Ref Range Status   Campylobacter species NOT DETECTED NOT DETECTED Final   Plesimonas shigelloides NOT DETECTED NOT DETECTED Final   Salmonella species NOT DETECTED NOT DETECTED Final   Yersinia enterocolitica NOT DETECTED NOT DETECTED Final   Vibrio species NOT DETECTED NOT DETECTED Final   Vibrio cholerae NOT DETECTED NOT DETECTED Final   Enteroaggregative E coli (EAEC) NOT  DETECTED NOT DETECTED Final   Enteropathogenic E coli (EPEC) NOT DETECTED NOT DETECTED Final   Enterotoxigenic E coli (ETEC) NOT DETECTED NOT DETECTED Final   Shiga like toxin producing E coli (STEC) NOT DETECTED NOT DETECTED Final   Shigella/Enteroinvasive E coli (EIEC) NOT DETECTED NOT DETECTED Final   Cryptosporidium NOT DETECTED NOT DETECTED Final   Cyclospora cayetanensis NOT DETECTED NOT DETECTED Final   Entamoeba histolytica NOT DETECTED NOT DETECTED Final   Giardia lamblia NOT DETECTED NOT DETECTED Final   Adenovirus F40/41 NOT DETECTED NOT DETECTED Final   Astrovirus NOT DETECTED NOT DETECTED Final   Norovirus GI/GII NOT  DETECTED NOT DETECTED Final   Rotavirus A NOT DETECTED NOT DETECTED Final   Sapovirus (I, II, IV, and V) NOT DETECTED NOT DETECTED Final    Comment: Performed at [facility], [address], [place], [place] [address]  Surgical pcr screen     Status: Abnormal   Collection Time: 04/16/24 11:30 PM   Specimen: Nasal Mucosa; Nasal Swab  Result Value Ref Range Status   MRSA, PCR NEGATIVE NEGATIVE Final   Staphylococcus aureus POSITIVE (A) NEGATIVE Final    Comment: (NOTE) The Xpert SA Assay (FDA approved for NASAL specimens in patients 25 years of age and older), is one component of a comprehensive surveillance program. It is not intended to diagnose infection nor to guide or monitor treatment. Performed at [facility], [address] [place], [place] [address]          Radiology Studies: No results found.       Scheduled Meds:  acetaminophen   1,000 mg Oral Q6H   alvimopan   12 mg Oral BID   amLODipine   2.5 mg Oral Daily   atorvastatin   10 mg Oral q1800   feeding supplement  1 Container Oral TID BM   levETIRAcetam   500 mg Oral Daily   levothyroxine   88 mcg Oral Q0600   methocarbamol   500 mg Oral TID   metoprolol  tartrate  100 mg Oral Daily   And   metoprolol  tartrate  50 mg Oral QHS   mupirocin  ointment  1 Application Nasal BID   Continuous Infusions:  sodium chloride  75 mL/hr at 04/18/24 1953   heparin  850 Units/hr (04/19/24 0603)     LOS: 10 days    Time spent: 35 minutes    [place] Bohac A Danika Kluender, MD [facility]   If 7PM-7AM, please contact night-coverage [email or web address]  04/19/2024, 7:10 AM

## 2024-04-19 NOTE — Progress Notes (Signed)
 Path reviewed.  Negative margins, two of 20 lymph nodes with metastasis.  Will likely benefit from chemo Rx.  Will request oncology consultation.  Krystal Spinner, MD [facility] A [facility] practice Office: [phone]

## 2024-04-19 NOTE — Discharge Instructions (Signed)
 [facility]      [facility], [name]  OPEN ABDOMINAL SURGERY: POST OP INSTRUCTIONS  Always review your discharge instruction sheet given to you by the facility where your surgery was performed.  IF YOU HAVE DISABILITY OR FAMILY LEAVE FORMS, YOU MUST BRING THEM TO THE OFFICE FOR PROCESSING.  PLEASE DO NOT GIVE THEM TO YOUR DOCTOR.  A prescription for pain medication may be given to you upon discharge.  Take your pain medication as prescribed, if needed.  If narcotic pain medicine is not needed, then you may take acetaminophen (Tylenol) or ibuprofen (Advil) as needed. Take your usually prescribed medications unless otherwise directed. If you need a refill on your pain medication, please contact your pharmacy. They will contact our office to request authorization.  Prescriptions will not be filled after 5pm or on week-ends. You should follow a light diet the first few days after arrival home, such as soup and crackers, pudding, etc.unless your doctor has advised otherwise. A high-fiber, low fat diet can be resumed as tolerated.   Be sure to include lots of fluids daily. Most patients will experience some swelling and bruising on the chest and neck area.  Ice packs will help.  Swelling and bruising can take several days to resolve Most patients will experience some swelling and bruising in the area of the incision. Ice pack will help. Swelling and bruising can take several days to resolve..  It is common to experience some constipation if taking pain medication after surgery.  Increasing fluid intake and taking a stool softener will usually help or prevent this problem from occurring.  A mild laxative (Milk of Magnesia or Miralax) should be taken according to package directions if there are no bowel movements after 48 hours.  You may have steri-strips (small skin tapes) in place directly over the incision.  These strips should be left on the skin for 7-10 days.  If your surgeon used skin  glue on the incision, you may shower in 24 hours.  The glue will flake off over the next 2-3 weeks.  Any sutures or staples will be removed at the office during your follow-up visit. You may find that a light gauze bandage over your incision may keep your staples from being rubbed or pulled. You may shower and replace the bandage daily. ACTIVITIES:  You may resume regular (light) daily activities beginning the next day--such as daily self-care, walking, climbing stairs--gradually increasing activities as tolerated.  You may have sexual intercourse when it is comfortable.  Refrain from any heavy lifting or straining until approved by your doctor. You may drive when you no longer are taking prescription pain medication, you can comfortably wear a seatbelt, and you can safely maneuver your car and apply brakes Return to Work: ___________________________________ Melissa Knight should see your doctor in the office for a follow-up appointment approximately two weeks after your surgery.  Make sure that you call for this appointment within a day or two after you arrive home to insure a convenient appointment time. OTHER INSTRUCTIONS:  _____________________________________________________________ _____________________________________________________________  WHEN TO CALL YOUR DOCTOR: Fever over 101.0 Inability to urinate Nausea and/or vomiting Extreme swelling or bruising Continued bleeding from incision. Increased pain, redness, or drainage from the incision. Difficulty swallowing or breathing Muscle cramping or spasms. Numbness or tingling in hands or feet or around lips.  The clinic staff is available to answer your questions during regular business hours.  Please don't hesitate to call and ask to speak to one of  the nurses if you have concerns.  For further questions, please visit [email or web address]

## 2024-04-19 NOTE — Plan of Care (Signed)

## 2024-04-19 NOTE — Progress Notes (Addendum)
 PHARMACY - ANTICOAGULATION CONSULT NOTE  Pharmacy Consult for heparin   Indication: hx atrial fibrillation and stroke (PTA warfarin on hold)  Allergies  Allergen Reactions   Sulfa Antibiotics Rash    rash    Patient Measurements: Height: 5' 2 (157.5 cm) Weight: 68 kg (150 lb) IBW/kg (Calculated) : 50.1 HEPARIN  DW (KG): 64.2  Vital Signs: Temp: 98.4 F (36.9 C) (08/08 0556) Temp Source: Oral (08/08 0556) BP: 109/55 (08/08 0556) Pulse Rate: 62 (08/08 0556)  Labs: Recent Labs    04/17/24 0318 04/18/24 0323 04/18/24 1338 04/19/24 0316  HGB 10.3* 10.7*  --  8.9*  HCT 34.0* 33.6*  --  28.2*  PLT 213 206  --  165  LABPROT 14.4 15.5*  --  16.2*  INR 1.1 1.2  --  1.2  HEPARINUNFRC  --  0.22* 0.33 0.69  CREATININE 0.56 0.73  --  0.72    Estimated Creatinine Clearance: 43.1 mL/min (by C-G formula based on SCr of 0.72 mg/dL).   Medications:  - PTA warfarin regimen: 3.75 mg daily per office note on 03/25/24 (last dose taken on 7/26)   Assessment: Patient is an 89 y.o F with hx CVA and afib on warfarin PTA who presented to the ED on 04/08/24 with c/o abdominal pain and n/v/d.  Abdominal/pelvis CT on 04/08/24 showed colonic mass and lung nodules. Warfarin placed on hold on admission and INR reversed with vit K for colonoscopy procedure. She underwent colonoscopy on 8/2 and was noted to have a partially obstructing large mass in the distal transverse colon/ proximal descending colon with concern for malignancy.  Pharmacy has been consulted on 04/14/24 to bridge with heparin  drip in case surgery is needed for colon mass.   Significant events:  - 7/29: vit K 2.5 mg PO x1 - 7/30: vit K 10mg  PO x1 in AM  and 5mg  PO x1 in PM - 7/31: vit K 10 mg PO x1 - 8/2: colonoscopy - 8/6: transverse colectomy  Today, 04/19/2024: - HL 0.65 therapeutic (higher end of range) on 850 units/hr  - Hgb 10.7>8.9 - low, PLTc 165 - low stable  - No interruptions with heparin  infusion or signs of bleeding per  RN.  Goal of Therapy:  INR goal 2-3 Heparin  level 0.3-0.7 units/ml Monitor platelets by anticoagulation protocol: Yes   Plan:  - Decrease heparin  drip to  800 units/hr - Obtain 8hr heparin  level - Daily heparin  level and CBC - Monitor for s/sx bleeding/ episodes of hematuria - F/u for ability to transition back to warfarin per surgery   Addendum 1422: Per surgery, pharmacy to begin transitioning to PTA Warfarin Warfarin 4mg  x1 at 1600 Continue heparin  drip at 800 units/hr and discontinue once INR is at goal of 2 Possible DDI: levothyroxine  may increase the anticoagulant effects of warfarin  Thank you for allowing pharmacy to be a part of this patient's care.  Marget Hench, PharmD Clinical Pharmacist 8/8/20258:25 AM

## 2024-04-19 NOTE — Progress Notes (Signed)
 Daily Progress Note   Patient Name: Melissa Knight       Date: 04/19/2024 DOB: [birth year]  Age: 89 y.o. MRN#: [id number] Attending Physician: Melissa Knight LABOR, MD Primary Care Physician: Melissa Mustard, FNP Admit Date: 04/08/2024  Reason for Consultation/Follow-up: Establishing goals of care  Subjective: No acute distress, resting in bed    Length of Stay: 10  Current Medications: Scheduled Meds:   acetaminophen   1,000 mg Oral Q6H   alvimopan   12 mg Oral BID   amLODipine   2.5 mg Oral Daily   atorvastatin   10 mg Oral q1800   feeding supplement  237 mL Oral BID BM   levETIRAcetam   500 mg Oral Daily   levothyroxine   88 mcg Oral Q0600   methocarbamol   500 mg Oral TID   metoprolol  tartrate  100 mg Oral Daily   And   metoprolol  tartrate  50 mg Oral QHS   mupirocin  ointment  1 Application Nasal BID    Continuous Infusions:  sodium chloride  75 mL/hr at 04/19/24 1005   heparin  850 Units/hr (04/19/24 0603)    PRN Meds: HYDROmorphone  (DILAUDID ) injection, ondansetron  **OR** ondansetron  (ZOFRAN ) IV, mouth rinse, oxyCODONE , traMADol   Physical Exam         Awake alert Resting in bed No distress Regular work of breathing Mild abd tenderness  Vital Signs: BP (!) 100/54 (BP Location: Right Arm)   Pulse (!) 56   Temp 98.4 F (36.9 C) (Oral)   Resp 15   Ht 5' 2 (1.575 m)   Wt 68 kg   SpO2 96%   BMI 27.44 kg/m  SpO2: SpO2: 96 % O2 Device: O2 Device: Room Air O2 Flow Rate: O2 Flow Rate (L/min): 8 L/min  Intake/output summary:  Intake/Output Summary (Last 24 hours) at 04/19/2024 1202 Last data filed at 04/19/2024 0930 Gross per 24 hour  Intake 2590.29 ml  Output 0 ml  Net 2590.29 ml   LBM: Last BM Date : 04/16/24 Baseline Weight: Weight: 65.8 kg Most recent weight:  Weight: 68 kg       Palliative Assessment/Data:      Patient Active Problem List   Diagnosis Date Noted   Palliative care encounter 04/14/2024   Counseling and coordination of care 04/14/2024   Goals of care, counseling/discussion 04/14/2024   Malnutrition of moderate degree 04/11/2024  Coagulopathy (HCC) 04/09/2024   Hypokalemia 04/09/2024   History of embolic stroke 04/09/2024   Colonic mass 04/08/2024   Morbid obesity (HCC) 10/05/2016   Lichen sclerosus of female genitalia 10/10/2014   Stress incontinence 10/10/2014   Hypothyroidism (acquired) 06/05/2014   Metabolic syndrome 01/01/2014   Hyperlipidemia 02/05/2013   Permanent atrial fibrillation (HCC) 07/02/2012   Late effect of cerebrovascular accident 06/30/2012   Seizure disorder, generalized convulsive, intractable (HCC) 06/30/2012   Essential hypertension 06/30/2012    Palliative Care Assessment & Plan   Patient Profile:    Assessment:  Patient Profile: Melissa Knight is a 89 y.o. female with a hx of permanent A-fib, hypertension, hyperlipidemia, hypothyroidism, history of CVA 06/2012 and history of seizure. near-obstruction colonic mass - colonoscopy with near-obstructing distal transverse mass, biopsies pending  Cardiology consult for pre op clearance has been requested.   Recommendations/Plan:  Full code full scope care  Metastatic adenocarcinoma transverse colon - oncology also consulted.  POD 2, s/p laparotomy with partial colectomy and primary anastomosis, Dr. Eletha 8/6  PMT remains available for ongoing goals of care discussions.    Social and Psycho social components of care: Patient lives by herself, her husband got COVID and PNA a few months ago, which has exacerbated his mild cognitive impairment and he is now in SNF.  She has 3 sons, 2 live out of state, son Melissa Knight lives in [place], [place].   Goals of Care and Additional Recommendations: Limitations on Scope of Treatment: Full Scope Treatment  Code  Status:    Code Status Orders  (From admission, onward)           Start     Ordered   04/09/24 0249  Full code  Continuous       Question:  By:  Answer:  Consent: discussion documented in EHR   04/09/24 0249           Code Status History     This patient has a current code status but no historical code status.       Prognosis:  Unable to determine  Discharge Planning: To Be Determined  Care plan was discussed with IDT  Thank you for allowing the Palliative Medicine Team to assist in the care of this patient. low MDM.      Greater than 50%  of this time was spent counseling and coordinating care related to the above assessment and plan.  Melissa Serve, MD  Please contact Palliative Medicine Team phone at [phone] for questions and concerns.

## 2024-04-19 NOTE — Progress Notes (Unsigned)
 Oncology Discharge Planning Note  [facility] Address: [address], [place], [place] [address] Hours of Operation:  [facility] [place], Monday - Friday  Clinic Contact Information:  [phone]) [phone]  Oncology Care Team: Medical Oncologist:  Cloretta  Patient Details: Name:  Melissa Knight, Melissa Knight MRN:   [id number] DOB:   07-02-1933 Reason for Current Admission: @PPROB @  Discharge Planning Narrative: Notification of admission received by inpatient team for [facility].  Discharge follow-up appointments for oncology are current and available on the AVS and MyChart.   Upon discharge from the hospital, hematology/oncology's post discharge plan of care for the outpatient setting is:   May 09, 2024 at 2:15pm with Olam Ned NP and Dr Cloretta  [facility] [address] [place], [place] [address]  [phone]   Actis Geofm will be called within two business days after discharge to review hematology/oncology's plan of care for full understanding.    Outpatient Oncology Specific Care Only: Oncology appointment transportation needs addressed?:  no Oncology medication management for symptom management addressed?:  not applicable Chemo Alert Card reviewed?:  not applicable Immunotherapy Alert Card reviewed?:  not applicable

## 2024-04-19 NOTE — Progress Notes (Addendum)
 PHARMACY - ANTICOAGULATION CONSULT NOTE  Pharmacy Consult for heparin  >> warfarin Indication: hx atrial fibrillation and stroke on warfarin PTA  Allergies  Allergen Reactions   Sulfa Antibiotics Rash    rash    Patient Measurements: Height: 5' 2 (157.5 cm) Weight: 68 kg (150 lb) IBW/kg (Calculated) : 50.1 HEPARIN  DW (KG): 64.2  Vital Signs: Temp: 98.4 F (36.9 C) (08/08 2326) Temp Source: Oral (08/08 2326) BP: 118/60 (08/08 2326) Pulse Rate: 65 (08/08 2326)  Labs: Recent Labs    04/17/24 0318 04/17/24 0318 04/18/24 0323 04/18/24 1338 04/19/24 0316 04/19/24 2136 04/19/24 2346 04/19/24 2348  HGB 10.3*  --  10.7*  --  8.9*  --  8.4*  --   HCT 34.0*  --  33.6*  --  28.2*  --  27.1*  --   PLT 213  --  206  --  165  --  165  --   LABPROT 14.4  --  15.5*  --  16.2*  --   --  15.8*  INR 1.1  --  1.2  --  1.2  --   --  1.2  HEPARINUNFRC  --    < > 0.22* 0.33 0.69 0.36  --   --   CREATININE 0.56  --  0.73  --  0.72  --   --   --    < > = values in this interval not displayed.    Estimated Creatinine Clearance: 43.1 mL/min (by C-G formula based on SCr of 0.72 mg/dL).   Medications:  - PTA warfarin regimen: 3.75 mg daily per office note on 03/25/24 (last dose taken on 7/26)   Assessment: Patient is an 89 y.o F with hx CVA and afib on warfarin PTA who presented to the ED on 04/08/24 with c/o abdominal pain and n/v/d.  Abdominal/pelvis CT on 04/08/24 showed colonic mass and lung nodules. Warfarin placed on hold on admission and INR reversed with vit K for colonoscopy procedure. She underwent colonoscopy on 8/2 and was noted to have a partially obstructing large mass in the distal transverse colon/ proximal descending colon with concern for malignancy.  Pharmacy has been consulted on 04/14/24 to bridge with heparin  drip in case surgery is needed for colon mass.   Significant events:  - 7/29: vit K 2.5 mg PO x1 - 7/30: vit K 10mg  PO x1 in AM  and 5mg  PO x1 in PM - 7/31: vit K 10  mg PO x1 - 8/2: colonoscopy - 8/6: transverse colectomy  Today, 04/20/2024: - HL 0.36 therapeutic on heparin  800 units/hr  - Hgb 10.7>8.9 - low - PLTc 165 - low, stable  - INR 1.2--low (warfarin resumed today, per surgery) - No s/sx of bleeding, per RN  Goal of Therapy:  INR goal 2-3 Heparin  level 0.3-0.7 units/ml Monitor platelets by anticoagulation protocol: Yes   Plan:  - Continue heparin  drip at 800 units/hr and discontinue once INR is at goal of 2 - Daily heparin  level, INR, and CBC - Monitor for s/sx bleeding/ episodes of hematuria   0035 Addendum: - Nurse tech reported BRB to RN while patient going to the bathroom. Repeat CBC and INR ordered by APP and heparin  gtt paused @2344 . - Repeat INR 1.2--low - Repeat CBC showed hgb 8.4 (low but stable compared to this AM) and plts 165 (low, stable) - Per APP, okay to resume heparin  now   0518 Addendum: - Heparin  level 0.42--therapeutic on heparin  800 units/hr - Hgb 8.5 (slightly improved from  earlier), plts 178 (slightly improved from earlier) - No other s/sx of bleeding per RN, but FOBT is positive >> okay to continue heparin  gtt for now, per APP - Check heparin  level and CBC daily  - Continue to monitor for s/sx of bleeding    Thank you for allowing pharmacy to be a part of this patient's care.  Lacinda Moats, PharmD Clinical Pharmacist  8/9/202512:35 AM

## 2024-04-20 DIAGNOSIS — K6389 Other specified diseases of intestine: Secondary | ICD-10-CM | POA: Diagnosis not present

## 2024-04-20 DIAGNOSIS — I4821 Permanent atrial fibrillation: Secondary | ICD-10-CM | POA: Diagnosis not present

## 2024-04-20 LAB — CBC
HCT: 27.1 % — ABNORMAL LOW (ref 36.0–46.0)
HCT: 28.2 % — ABNORMAL LOW (ref 36.0–46.0)
HCT: 28.4 % — ABNORMAL LOW (ref 36.0–46.0)
HCT: 32.1 % — ABNORMAL LOW (ref 36.0–46.0)
Hemoglobin: 8.4 g/dL — ABNORMAL LOW (ref 12.0–15.0)
Hemoglobin: 8.5 g/dL — ABNORMAL LOW (ref 12.0–15.0)
Hemoglobin: 8.7 g/dL — ABNORMAL LOW (ref 12.0–15.0)
Hemoglobin: 9.9 g/dL — ABNORMAL LOW (ref 12.0–15.0)
MCH: 27.8 pg (ref 26.0–34.0)
MCH: 28.9 pg (ref 26.0–34.0)
MCH: 29.2 pg (ref 26.0–34.0)
MCH: 29.6 pg (ref 26.0–34.0)
MCHC: 29.9 g/dL — ABNORMAL LOW (ref 30.0–36.0)
MCHC: 30.8 g/dL (ref 30.0–36.0)
MCHC: 30.9 g/dL (ref 30.0–36.0)
MCHC: 31 g/dL (ref 30.0–36.0)
MCV: 92.8 fL (ref 80.0–100.0)
MCV: 93.6 fL (ref 80.0–100.0)
MCV: 94.1 fL (ref 80.0–100.0)
MCV: 95.9 fL (ref 80.0–100.0)
Platelets: 165 K/uL (ref 150–400)
Platelets: 177 K/uL (ref 150–400)
Platelets: 178 K/uL (ref 150–400)
Platelets: 227 K/uL (ref 150–400)
RBC: 2.88 MIL/uL — ABNORMAL LOW (ref 3.87–5.11)
RBC: 2.94 MIL/uL — ABNORMAL LOW (ref 3.87–5.11)
RBC: 3.06 MIL/uL — ABNORMAL LOW (ref 3.87–5.11)
RBC: 3.43 MIL/uL — ABNORMAL LOW (ref 3.87–5.11)
RDW: 14.6 % (ref 11.5–15.5)
RDW: 14.6 % (ref 11.5–15.5)
RDW: 14.6 % (ref 11.5–15.5)
RDW: 14.7 % (ref 11.5–15.5)
WBC: 10 K/uL (ref 4.0–10.5)
WBC: 6.7 K/uL (ref 4.0–10.5)
WBC: 6.9 K/uL (ref 4.0–10.5)
WBC: 7.6 K/uL (ref 4.0–10.5)
nRBC: 0 % (ref 0.0–0.2)
nRBC: 0 % (ref 0.0–0.2)
nRBC: 0 % (ref 0.0–0.2)
nRBC: 0 % (ref 0.0–0.2)

## 2024-04-20 LAB — HEMOGLOBIN AND HEMATOCRIT, BLOOD
HCT: 28 % — ABNORMAL LOW (ref 36.0–46.0)
Hemoglobin: 8.7 g/dL — ABNORMAL LOW (ref 12.0–15.0)

## 2024-04-20 LAB — URINALYSIS, ROUTINE W REFLEX MICROSCOPIC
Bilirubin Urine: NEGATIVE
Glucose, UA: NEGATIVE mg/dL
Ketones, ur: NEGATIVE mg/dL
Nitrite: NEGATIVE
Protein, ur: NEGATIVE mg/dL
Specific Gravity, Urine: 1.008 (ref 1.005–1.030)
pH: 5 (ref 5.0–8.0)

## 2024-04-20 LAB — TYPE AND SCREEN
ABO/RH(D): AB NEG
Antibody Screen: NEGATIVE

## 2024-04-20 LAB — HEPARIN LEVEL (UNFRACTIONATED): Heparin Unfractionated: 0.42 [IU]/mL (ref 0.30–0.70)

## 2024-04-20 LAB — PROTIME-INR
INR: 1.2 (ref 0.8–1.2)
INR: 1.3 — ABNORMAL HIGH (ref 0.8–1.2)
Prothrombin Time: 15.8 s — ABNORMAL HIGH (ref 11.4–15.2)
Prothrombin Time: 16.6 s — ABNORMAL HIGH (ref 11.4–15.2)

## 2024-04-20 LAB — OCCULT BLOOD X 1 CARD TO LAB, STOOL: Fecal Occult Bld: POSITIVE — AB

## 2024-04-20 MED ORDER — PANTOPRAZOLE SODIUM 40 MG IV SOLR
40.0000 mg | Freq: Two times a day (BID) | INTRAVENOUS | Status: DC
  Administered 2024-04-20 – 2024-04-24 (×14): 40 mg via INTRAVENOUS
  Filled 2024-04-20 (×9): qty 10

## 2024-04-20 MED ORDER — HEPARIN (PORCINE) 25000 UT/250ML-% IV SOLN: 800.0000 [IU]/h | INTRAVENOUS | Status: DC

## 2024-04-20 MED ORDER — WARFARIN SODIUM 2.5 MG PO TABS
3.7500 mg | ORAL_TABLET | Freq: Once | ORAL | Status: DC
  Filled 2024-04-20: qty 1

## 2024-04-20 NOTE — Progress Notes (Addendum)
 PHARMACY - ANTICOAGULATION CONSULT NOTE  Pharmacy Consult for warfarin + heparin  bridge Indication: hx atrial fibrillation and stroke on warfarin PTA  Allergies  Allergen Reactions   Sulfa Antibiotics Rash    rash    Patient Measurements: Height: 5' 2 (157.5 cm) Weight: 68 kg (150 lb) IBW/kg (Calculated) : 50.1 HEPARIN  DW (KG): 64.2  Vital Signs: Temp: 98.1 F (36.7 C) (08/09 0503) Temp Source: Oral (08/09 0503) BP: 134/67 (08/09 0503) Pulse Rate: 61 (08/09 0503)  Labs: Recent Labs    04/18/24 0323 04/18/24 1338 04/19/24 0316 04/19/24 2136 04/19/24 2346 04/19/24 2348 04/20/24 0337 04/20/24 0755  HGB 10.7*  --  8.9*  --  8.4*  --  8.5* 8.7*  HCT 33.6*  --  28.2*  --  27.1*  --  28.4* 28.2*  PLT 206  --  165  --  165  --  178 177  LABPROT 15.5*  --  16.2*  --   --  15.8* 16.6*  --   INR 1.2  --  1.2  --   --  1.2 1.3*  --   HEPARINUNFRC 0.22*   < > 0.69 0.36  --   --  0.42  --   CREATININE 0.73  --  0.72  --   --   --   --   --    < > = values in this interval not displayed.    Estimated Creatinine Clearance: 43.1 mL/min (by C-G formula based on SCr of 0.72 mg/dL).   Medications:  - PTA warfarin regimen: 3.75 mg daily per office note on 03/25/24 (last dose taken prior to admission was on 7/26)   Assessment: Patient is an 89 y.o F with hx CVA and afib on warfarin PTA who presented to the ED on 04/08/24 with c/o abdominal pain and n/v/d.  Abdominal/pelvis CT on 04/08/24 showed colonic mass and lung nodules. Warfarin placed on hold on admission and INR reversed with vit K for colonoscopy procedure. She underwent colonoscopy on 8/2 and was noted to have a partially obstructing large mass in the distal transverse colon/ proximal descending colon with concern for malignancy.  Pharmacy consulted to dose heparin  on 8/3 in anticipation of further surgical intervention.   Pt underwent ex lap with transverse colectomy on 04/17/24. Heparin  resumed on 8/6 post-op per [facility]  instructions. Warfarin resumed 8/8 - pharmacy consulted to dose warfarin with heparin  bridge until INR therapeutic.   Significant events:  - 7/29: vit K 2.5 mg PO x1 - 7/30: vit K 10mg  PO x1 in AM  and 5mg  PO x1 in PM - 7/31: vit K 10 mg PO x1 - 8/2: colonoscopy - 8/6: transverse colectomy. Heparin  resumed post-op PM - 8/8: pt reported blood in urine and stool overnight. Heparin  was briefly paused from 8/8 @ 23:44 > 8/9 @ 00:35.  -8/9: Heparin  drip discontinued by MD @ 07:11. Pharmacy not notified. Reached out to MD this morning verbal order to resume UFH and continue with warfarin dosing.   Today, 04/20/24 INR = 1.3 remains subtherapeutic Will take several days to see full effect of resuming warfarin on INR. Major DDI: None CBC: Hgb (8.7) low but consistent with previous labs, Plt WNL Discussed anticoagulation plan with Dr. Madelyne > ok to resume heparin  now, continue with warfarin  D/w RN, FOBT+ overnight but no signs of bleeding at this time. Monitoring.   Goal of Therapy:  INR goal 2-3 Heparin  level 0.3-0.7 units/ml Monitor platelets by anticoagulation protocol: Yes  Plan:  Resume heparin  at previously therapeutic rate of 800 units/hr Check 8 hour heparin  level Warfarin 3.75 mg PO once (home dose) CBC, heparin  level, INR daily Monitor for signs of bleeding  Ronal CHRISTELLA Rav, PharmD 04/20/24 11:00 AM  Addendum: Dr. Madelyne reached out > pt with black stool. MD is discontinuing heparin  drip and repeating CBC at 16:00. Per MD, hold off on giving any warfarin until after CBC results assessed.   Heparin  consult discontinued per MD. Warfarin order discontinued and on hold pending verbal order from provider to resume warfarin.   Ronal CHRISTELLA Rav, PharmD 04/20/24 12:02 PM  Addendum: Per Dr. Madelyne, holding warfarin as well with coffee ground BM. Pharmacy dosing consult discontinued.   Ronal CHRISTELLA Rav, PharmD 04/20/24 1:39 PM

## 2024-04-20 NOTE — Progress Notes (Signed)
 Daily Progress Note   Patient Name: Melissa Knight       Date: 04/20/2024 DOB: 1933-07-03  Age: 89 y.o. MRN#: [id number] Attending Physician: Madelyne Owen LABOR, MD Primary Care Physician: [name], FNP Admit Date: 04/08/2024  Reason for Consultation/Follow-up: Establishing goals of care  Subjective: No acute distress, resting in bed, does not have any more bleeding. Asks when she can go home.    Length of Stay: 11  Current Medications: Scheduled Meds:   acetaminophen   1,000 mg Oral Q6H   amLODipine   2.5 mg Oral Daily   atorvastatin   10 mg Oral q1800   feeding supplement  237 mL Oral BID BM   levETIRAcetam   500 mg Oral Daily   levothyroxine   88 mcg Oral Q0600   methocarbamol   500 mg Oral TID   metoprolol  tartrate  100 mg Oral Daily   And   metoprolol  tartrate  50 mg Oral QHS   mupirocin  ointment  1 Application Nasal BID   pantoprazole  (PROTONIX ) IV  40 mg Intravenous Q12H    Continuous Infusions:  sodium chloride  75 mL/hr at 04/20/24 1008    PRN Meds: HYDROmorphone  (DILAUDID ) injection, ondansetron  **OR** ondansetron  (ZOFRAN ) IV, mouth rinse, oxyCODONE , traMADol   Physical Exam         Awake alert Resting in chair No distress Regular work of breathing Mild abd tenderness  Vital Signs: BP 134/67 (BP Location: Right Arm)   Pulse 61   Temp 98.1 F (36.7 C) (Oral)   Resp 16   Ht 5' 2 (1.575 m)   Wt 68 kg   SpO2 97%   BMI 27.44 kg/m  SpO2: SpO2: 97 % O2 Device: O2 Device: Room Air O2 Flow Rate: O2 Flow Rate (L/min): 8 L/min  Intake/output summary:  Intake/Output Summary (Last 24 hours) at 04/20/2024 1327 Last data filed at 04/20/2024 0600 Gross per 24 hour  Intake 1629.37 ml  Output --  Net 1629.37 ml   LBM: Last BM Date : 04/19/24 Baseline Weight:  Weight: 65.8 kg Most recent weight: Weight: 68 kg       Palliative Assessment/Data:      Patient Active Problem List   Diagnosis Date Noted   Palliative care encounter 04/14/2024   Counseling and coordination of care 04/14/2024   Goals of care, counseling/discussion 04/14/2024   Malnutrition of  moderate degree 04/11/2024   Coagulopathy (HCC) 04/09/2024   Hypokalemia 04/09/2024   History of embolic stroke 04/09/2024   Colonic mass 04/08/2024   Morbid obesity (HCC) 10/05/2016   Lichen sclerosus of female genitalia 10/10/2014   Stress incontinence 10/10/2014   Hypothyroidism (acquired) 06/05/2014   Metabolic syndrome 01/01/2014   Hyperlipidemia 02/05/2013   Permanent atrial fibrillation (HCC) 07/02/2012   Late effect of cerebrovascular accident 06/30/2012   Seizure disorder, generalized convulsive, intractable (HCC) 06/30/2012   Essential hypertension 06/30/2012    Palliative Care Assessment & Plan   Patient Profile:    Assessment:  Patient Profile: Melissa Knight is a 89 y.o. female with a hx of permanent A-fib, hypertension, hyperlipidemia, hypothyroidism, history of CVA 06/2012 and history of seizure. near-obstruction colonic mass - colonoscopy with near-obstructing distal transverse mass, biopsies pending  Cardiology consult for pre op clearance has been requested.   Recommendations/Plan:  Full code full scope care  Metastatic adenocarcinoma transverse colon - oncology also consulted.  POD 3, s/p laparotomy with partial colectomy and primary anastomosis, Dr. Eletha 8/6  PMT remains available for ongoing goals of care discussions.    Social and Psycho social components of care: Patient lives by herself, her husband got COVID and PNA a few months ago, which has exacerbated his mild cognitive impairment and he is now in SNF.  She has 3 sons, 2 live out of state, son Cathlyn lives in [place], [place].   Goals of Care and Additional Recommendations: Limitations on Scope of  Treatment: Full Scope Treatment  Code Status:    Code Status Orders  (From admission, onward)           Start     Ordered   04/09/24 0249  Full code  Continuous       Question:  By:  Answer:  Consent: discussion documented in EHR   04/09/24 0249           Code Status History     This patient has a current code status but no historical code status.       Prognosis:  Unable to determine  Discharge Planning: To Be Determined  Care plan was discussed with IDT  Thank you for allowing the Palliative Medicine Team to assist in the care of this patient. low MDM.      Greater than 50%  of this time was spent counseling and coordinating care related to the above assessment and plan.  Lonia Serve, MD  Please contact Palliative Medicine Team phone at [phone] for questions and concerns.

## 2024-04-20 NOTE — Plan of Care (Signed)
 Problem: Clinical Measurements: Goal: Ability to maintain clinical measurements within normal limits will improve Outcome: Progressing   Problem: Activity: Goal: Risk for activity intolerance will decrease Outcome: Progressing   Problem: Coping: Goal: Level of anxiety will decrease Outcome: Progressing   Problem: Pain Managment: Goal: General experience of comfort will improve and/or be controlled Outcome: Progressing   Jon LULLA Reins, RN 04/20/24 7:22 PM

## 2024-04-20 NOTE — Progress Notes (Signed)
 3 Days Post-Op   Subjective/Chief Complaint: No complaints Tolerating full liquid diet Having BM's   Objective: Vital signs in last 24 hours: Temp:  [97.9 F (36.6 C)-98.4 F (36.9 C)] 98.1 F (36.7 C) (08/09 0503) Pulse Rate:  [49-66] 61 (08/09 0503) Resp:  [16-17] 16 (08/09 0503) BP: (100-134)/(54-70) 134/67 (08/09 0503) SpO2:  [96 %-98 %] 97 % (08/09 0503) Last BM Date : 04/19/24  Intake/Output from previous day: 08/08 0701 - 08/09 0700 In: 1829.1 [P.O.:430; I.V.:1399.1] Out: -  Intake/Output this shift: No intake/output data recorded.  Exam: Awake and alert Up in a chair Abdomen soft, minimally tender  Lab Results:  Recent Labs    04/20/24 0337 04/20/24 0755  WBC 6.9 6.7  HGB 8.5* 8.7*  HCT 28.4* 28.2*  PLT 178 177   BMET Recent Labs    04/18/24 0323 04/19/24 0316  NA 133* 134*  K 4.6 3.8  CL 102 104  CO2 22 22  GLUCOSE 142* 98  BUN 9 9  CREATININE 0.73 0.72  CALCIUM  8.2* 7.9*   PT/INR Recent Labs    04/19/24 2348 04/20/24 0337  LABPROT 15.8* 16.6*  INR 1.2 1.3*   ABG No results for input(s): PHART, HCO3 in the last 72 hours.  Invalid input(s): PCO2, PO2  Studies/Results: No results found.  Anti-infectives: Anti-infectives (From admission, onward)    Start     Dose/Rate Route Frequency Ordered Stop   04/17/24 0600  cefoTEtan  (CEFOTAN ) 2 g in sodium chloride  0.9 % 100 mL IVPB        2 g 200 mL/hr over 30 Minutes Intravenous On call to O.R. 04/16/24 0724 04/17/24 1309   04/16/24 2200  neomycin  (MYCIFRADIN ) tablet 1,000 mg       Placed in And Linked Group   1,000 mg Oral  Once 04/16/24 2047 04/16/24 2124   04/16/24 2200  metroNIDAZOLE  (FLAGYL ) tablet 1,000 mg       Placed in And Linked Group   1,000 mg Oral  Once 04/16/24 2047 04/16/24 2124   04/16/24 1400  neomycin  (MYCIFRADIN ) tablet 1,000 mg  Status:  Discontinued       Placed in And Linked Group   1,000 mg Oral 3 times per day 04/16/24 0724 04/16/24 0725    04/16/24 1400  metroNIDAZOLE  (FLAGYL ) tablet 1,000 mg  Status:  Discontinued       Placed in And Linked Group   1,000 mg Oral 3 times per day 04/16/24 0724 04/16/24 0725   04/09/24 0430  cefTRIAXone  (ROCEPHIN ) 1 g in sodium chloride  0.9 % 100 mL IVPB  Status:  Discontinued        1 g 200 mL/hr over 30 Minutes Intravenous Every 24 hours 04/09/24 0340 04/09/24 1432       Assessment/Plan: POD 3, s/p laparotomy with partial colectomy and primary anastomosis, Dr. Eletha 8/6   -doing well -advance to a soft diet -labs stable  Vicenta Poli MD 04/20/2024

## 2024-04-20 NOTE — Progress Notes (Signed)
 PROGRESS NOTE    Melissa Knight  FMW:[id number] DOB: November 27, 1932 DOA: 04/08/2024 PCP: [name], FNP   Brief Narrative: 89 year old with past medical history significant for permanent A-fib, hypertension, hypothyroidism, embolic stroke presented to the ER complaining of abdominal pain, nausea vomiting and diarrhea since 3 days prior to admission.  She has lost at least 50 pounds over the last year.  In the ER CT abdomen and pelvis showed features concerning for colonic mass in the transverse colon concerning for colon cancer.  There is also lung nodules in the chest field concerning for possible metastasis.  Patient was admitted under hospital service, she underwent colonoscopy which showed near obstructing mass, general surgery consulted, patient underwent transverse colectomy by Dr. 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  - Underwent colonoscopy 8/2 by [name], which showed partially obstructing mass with pinpoint opening in the distal transverse proximal descending colon.  Pathology transverse colon mass hyperplastic polyp negative for dysplasia, sigmoid colon polyp traditional serrated adenoma hyperplastic polyp. - General Surgery consulted, patient underwent transfer colectomy on 8/6. - Patient was evaluated by cardiology and felt to be high risk, patient understand risk and agreed to proceed with surgery. -CT chest was negative for metastatic disease. -Surgical pathology: adenocarcinoma.  -Management per Sx -On Alvimopan  -Diet advance to  full liquid.  -Had some bloody stool and or hematuria last night. This morning has had 2 coffee color BM, watery. -Discussed with cardiology and general Sx plan to hold Anticoagulation.  -Start IV PPI BID.   History of paroxysmal A-fib: Initially with supratherapeutic INR. Continue metoprolol  Heparin  was  resume 8/6 per surgery recommendations.  Had some bloody stool now black stool.  Plan to hold anticoagulation.   Melena, Vs hematochezia:  Anticoagulation stopped.  Start IV protonix .  Monitor Hb.  Transfusion as needed  Acute normocytic anemia: The setting of malignancy Monitor hb,. Trending down 10---8.9   Essential hypertension: Continue amlodipine  and metoprolol  Continue to hold lisinopril  for now  History of stroke: Continue statin, hold Coumadin  pre op  Hypokalemia Replaced.   Acquired hypothyroidism: Continue with Synthroid  Seizure disorder: Continue with Keppra   UTI was rule out   Nutrition Problem: Moderate Malnutrition Etiology: chronic illness    Signs/Symptoms: mild muscle depletion, percent weight loss (13% in 5.5 months) Percent weight loss: 13 % (in 5.5 months)    Interventions: Refer to RD note for recommendations, Boost Breeze  Estimated body mass index is 27.44 kg/m as calculated from the following:   Height as of this encounter: 5' 2 (1.575 m).   Weight as of this encounter: 68 kg.   DVT prophylaxis: heparin   Code Status: Full code Family Communication: Disposition Plan:  Status is: Inpatient Remains inpatient appropriate because: colon mass    Consultants:  GI Surgery Cardiology   Procedures:  Colonoscopy   Antimicrobials:    Subjective: Had some bloody stool vs hematuria last night. 2 black watery stool today.   Objective: Vitals:   04/19/24 1409 04/19/24 2049 04/19/24 2326 04/20/24 0503  BP: 115/70 119/66 118/60 134/67  Pulse: 66 63 65  61  Resp: 17 16 16 16   Temp: 97.9 F (36.6 C) 98.1 F (36.7 C) 98.4 F (36.9  C) 98.1 F (36.7 C)  TempSrc:  Oral Oral Oral  SpO2: 98% 97% 96% 97%  Weight:      Height:        Intake/Output Summary (Last 24 hours) at 04/20/2024 1035 Last data filed at 04/20/2024 0600 Gross per 24 hour  Intake 1629.37 ml  Output --  Net 1629.37 ml   Filed Weights   04/08/24 1818 04/13/24 1032 04/17/24 0736  Weight: 65.8 kg 65.8 kg 68 kg    Examination:  General exam: NAD Respiratory system: CTA Cardiovascular system: S1, S 2 RRR Gastrointestinal system: BS present, soft, nt  clean dressing,  Central nervous system: ALer Extremities: no edema   Data Reviewed: I have personally reviewed following labs and imaging studies  CBC: Recent Labs  Lab 04/18/24 0323 04/19/24 0316 04/19/24 2346 04/20/24 0337 04/20/24 0755  WBC 18.6* 10.6* 7.6 6.9 6.7  HGB 10.7* 8.9* 8.4* 8.5* 8.7*  HCT 33.6* 28.2* 27.1* 28.4* 28.2*  MCV 92.1 93.4 94.1 92.8 95.9  PLT 206 165 165 178 177   Basic Metabolic Panel: Recent Labs  Lab 04/14/24 0319 04/17/24 0318 04/18/24 0323 04/19/24 0316  NA 137 138 133* 134*  K 3.8 4.1 4.6 3.8  CL 106 103 102 104  CO2 22 25 22 22   GLUCOSE 120* 97 142* 98  BUN <5* 7* 9 9  CREATININE 0.52 0.56 0.73 0.72  CALCIUM  8.5* 8.7* 8.2* 7.9*   GFR: Estimated Creatinine Clearance: 43.1 mL/min (by C-G formula based on SCr of 0.72 mg/dL). Liver Function Tests: No results for input(s): AST, ALT, ALKPHOS, BILITOT, PROT, ALBUMIN in the last 168 hours. No results for input(s): LIPASE, AMYLASE in the last 168 hours. No results for input(s): AMMONIA in the last 168 hours. Coagulation Profile: Recent Labs  Lab 04/17/24 0318 04/18/24 0323 04/19/24 0316 04/19/24 2348 04/20/24 0337  INR 1.1 1.2 1.2 1.2 1.3*   Cardiac Enzymes: No results for input(s): CKTOTAL, CKMB, CKMBINDEX, TROPONINI in the last 168 hours. BNP (last 3 results) No results for input(s): PROBNP in the last 8760 hours. HbA1C: No results for input(s): HGBA1C in  the last 72 hours. CBG: No results for input(s): GLUCAP in the last 168 hours. Lipid Profile: No results for input(s): CHOL, HDL, LDLCALC, TRIG, CHOLHDL, LDLDIRECT in the last 72 hours. Thyroid  Function Tests: No results for input(s): TSH, T4TOTAL, FREET4, T3FREE, THYROIDAB in the last 72 hours. Anemia Panel: No results for input(s): VITAMINB12, FOLATE, FERRITIN, TIBC, IRON, RETICCTPCT in the last 72 hours. Sepsis Labs: No results for input(s): PROCALCITON, LATICACIDVEN in the last 168 hours.  Recent Results (from the past 240 hours)  Surgical pcr screen     Status: Abnormal   Collection Time: 04/16/24 11:30 PM   Specimen: Nasal Mucosa; Nasal Swab  Result Value Ref Range Status   MRSA, PCR NEGATIVE NEGATIVE Final   Staphylococcus aureus POSITIVE (A) NEGATIVE Final    Comment: (NOTE) The Xpert SA Assay (FDA approved for NASAL specimens in patients 48 years of age and older), is one component of a comprehensive surveillance program. It is not intended to diagnose infection nor to guide or monitor treatment. Performed at [facility], [address] [place], [place] [address]          Radiology Studies: No results found.       Scheduled Meds:  acetaminophen   1,000 mg Oral Q6H  alvimopan   12 mg Oral BID   amLODipine   2.5 mg Oral Daily   atorvastatin   10 mg Oral q1800   feeding supplement  237 mL Oral BID BM   levETIRAcetam   500 mg Oral Daily   levothyroxine   88 mcg Oral Q0600   methocarbamol   500 mg Oral TID   metoprolol  tartrate  100 mg Oral Daily   And   metoprolol  tartrate  50 mg Oral QHS   mupirocin  ointment  1 Application Nasal BID   Warfarin - Pharmacist Dosing Inpatient   Does not apply q1600   Continuous Infusions:  sodium chloride  75 mL/hr at 04/20/24 1008     LOS: 11 days    Time spent: 35 minutes    Vashawn Ekstein A Jaiyanna Safran, MD [facility]   If 7PM-7AM, please contact  night-coverage [email or web address]  04/20/2024, 10:35 AM

## 2024-04-20 NOTE — Progress Notes (Addendum)
     Patient Name: Melissa Knight           DOB: 1933-09-07  MRN: [id number]      Admission Date: 04/08/2024  Attending Provider: Madelyne Owen LABOR, MD  Primary Diagnosis: Colonic mass   Level of care: Med-Surg   OVERNIGHT EVENT  Unclear bleeding source Notified by bedside RN of bright red blood observed in the toilet following patient's voiding and bowel movement.   Unclear volume of blood and unclear if source of bleeding is hematuria vs. rectal bleeding.  Collect FOBT, urinalysis. 7/28- FOBT positive on admission due to near obstructing colonic mass, constipation 8/5- Prior RN notes reported a 1x episode of ~ 200 ml red bloody urine,   Patient is POD 2 following laparotomy with partial colectomy and primary anastomosis.   She denies any lightheadedness, nausea, vomiting, hematemesis, melena, rectal pain, dysuria, or other urinary symptoms. Abdomen soft, mildly tender, no external hemorrhoids noted.  Patient takes Coumadin  at home for history of CVA and PAF.  Currently on heparin  gtt with plans to bridge back to Coumadin .  She received 4 mg of Coumadin  this afternoon.  Check INR. Last hemoglobin 8.9.  Currently hemodynamically stable.  Will hold heparin  GTT until CBC is back.   Plan: Hold heparin  until CBC is back.  Okay to restart if hemoglobin is stable. CBC INR Evaluate source of bleeding- FOBT, Urinalysis IV Protonix    Addendum: Hemoglobin 8.4, stable. INR 1.2.  No additional bleeding reported.  Okay to continue heparin , discussed with pharmacy. Nursing staff to continue monitoring for bleeding.    Melissa Guastella, DNP, ACNPC- AG [facility] [place]

## 2024-04-20 NOTE — Evaluation (Signed)
 Occupational Therapy Evaluation Patient Details Name: Melissa Knight MRN: [id number] DOB: Mar 26, 1933 Today's Date: 04/20/2024   History of Present Illness   89 yo female admitted 04/08/24   complaining of abdominal pain, nausea vomiting and diarrhea. CT abdomen and pelvis showed features concerning for colonic mass in the transverse colon concerning for colon cancer.  S/P laparotomy with partial colectomy and primary anastomosis, Dr. Eletha 8/6. past medical history significant for permanent A-fib, hypertension, hypothyroidism, embolic stroke     Clinical Impressions PTA, patient lives at home with husband who is recovering in rehab and patient is typically his caregiver and independent including driving. Has a local son and family and other son and DIL in [place]. Currently, patient presents with deficits outlined below (see OT Problem List for details) most significantly  decreased activity tolerance and balance limiting BADL's and functional mobility. Patient requires continued Acute care hospital level OT services to progress safety and functional performance and allow for discharge. Patient will benefit from continued inpatient follow up therapy, <3 hours/day.        If plan is discharge home, recommend the following:   A little help with walking and/or transfers;A little help with bathing/dressing/bathroom;Assistance with cooking/housework;Assist for transportation;Help with stairs or ramp for entrance     Functional Status Assessment   Patient has had a recent decline in their functional status and demonstrates the ability to make significant improvements in function in a reasonable and predictable amount of time.     Equipment Recommendations   Other (comment) (TBD post rehab, if home needs shower chair)      Precautions/Restrictions   Precautions Precautions: Fall Precaution/Restrictions Comments: abd sg Restrictions Weight Bearing Restrictions Per Provider Order: No      Mobility Bed Mobility               General bed mobility comments: pt seated in recliner and returned to recliner    Transfers Overall transfer level: Needs assistance Equipment used: Rolling walker (2 wheels) Transfers: Sit to/from Stand, Bed to chair/wheelchair/BSC Sit to Stand: Supervision     Step pivot transfers: Contact guard assist     General transfer comment: min cues for hand placement and pacing, amb to and from bathroom and short hallway distance with report of fatigue with RW      Balance Overall balance assessment: Mild deficits observed, not formally tested                                         ADL either performed or assessed with clinical judgement   ADL Overall ADL's : Needs assistance/impaired Eating/Feeding: Independent   Grooming: Wash/dry hands;Wash/dry face;Oral care;Contact guard assist;Standing   Upper Body Bathing: Contact guard assist;Sitting   Lower Body Bathing: Minimal assistance;Sit to/from stand   Upper Body Dressing : Contact guard assist;Sitting   Lower Body Dressing: Minimal assistance;Sit to/from stand   Toilet Transfer: Contact guard assist   Toileting- Clothing Manipulation and Hygiene: Set up;Sitting/lateral lean   Tub/ Shower Transfer: Minimal assistance;Rolling walker (2 wheels);Shower seat   Functional mobility during ADLs: Contact guard assist;Rolling walker (2 wheels) General ADL Comments: fatigues easily     Vision Baseline Vision/History: 0 No visual deficits;1 Wears glasses Ability to See in Adequate Light: 0 Adequate Patient Visual Report: No change from baseline Vision Assessment?: No apparent visual deficits;Wears glasses for reading  Pertinent Vitals/Pain Pain Assessment Pain Assessment: 0-10 Pain Score: 0-No pain     Extremity/Trunk Assessment Upper Extremity Assessment Upper Extremity Assessment: Right hand dominant;Overall WFL for tasks assessed   Lower  Extremity Assessment Lower Extremity Assessment: Defer to PT evaluation   Cervical / Trunk Assessment Cervical / Trunk Assessment: Normal;Other exceptions   Communication Communication Communication: No apparent difficulties   Cognition Arousal: Alert Behavior During Therapy: WFL for tasks assessed/performed Cognition: No apparent impairments                               Following commands: Intact       Cueing  General Comments   Cueing Techniques: Verbal cues  no SOB on RA, but rests needed after 10 minutes of light activity           Home Living Family/patient expects to be discharged to:: Private residence Living Arrangements: Spouse/significant other Available Help at Discharge: Family;Available 24 hours/day Type of Home: House Home Access: Stairs to enter [employer] of Steps: 4 Entrance Stairs-Rails: Left;Can reach both;Right Home Layout: One level     Bathroom Shower/Tub: IT trainer: Standard Bathroom Accessibility: Yes How Accessible: Accessible via walker Home Equipment: Cane - single point;Adaptive equipment Adaptive Equipment: Reacher        Prior Functioning/Environment Prior Level of Function : Independent/Modified Independent;Driving             Mobility Comments: states recently started driving after not driving, spouse has not been able to drive, furniture cruises, uses cane when out ADLs Comments: grandson takes to grocery store, independent ADLs    OT Problem List: Impaired balance (sitting and/or standing);Decreased activity tolerance   OT Treatment/Interventions: Self-care/ADL training;Therapeutic exercise;Energy conservation;DME and/or AE instruction;Therapeutic activities;Balance training;Patient/family education      OT Goals(Current goals can be found in the care plan section)   Acute Rehab OT Goals Patient Stated Goal: to go to get stronger where my husband is OT  Goal Formulation: With patient Time For Goal Achievement: 05/04/24 Potential to Achieve Goals: Good ADL Goals Pt Will Perform Lower Body Bathing: with supervision;sit to/from stand Pt Will Perform Lower Body Dressing: with supervision;sit to/from stand Pt Will Transfer to Toilet: with supervision;ambulating;regular height toilet Pt Will Perform Toileting - Clothing Manipulation and hygiene: with supervision;sit to/from stand Pt Will Perform Tub/Shower Transfer: Tub transfer;shower seat;ambulating;with supervision Pt/caregiver will Perform Home Exercise Program: Increased strength;Both right and left upper extremity;Independently;With written HEP provided Additional ADL Goal #1: Patient will teach back and integrate 5 P's of ECT with BADL's and mobility   OT Frequency:  Min 2X/week       AM-PAC OT 6 Clicks Daily Activity     Outcome Measure Help from another person eating meals?: None Help from another person taking care of personal grooming?: A Little Help from another person toileting, which includes using toliet, bedpan, or urinal?: A Little Help from another person bathing (including washing, rinsing, drying)?: A Little Help from another person to put on and taking off regular upper body clothing?: A Little Help from another person to put on and taking off regular lower body clothing?: A Little 6 Click Score: 19   End of Session Equipment Utilized During Treatment: Gait belt;Rolling walker (2 wheels) Nurse Communication: Mobility status  Activity Tolerance: Patient limited by fatigue Patient left: in chair;with call bell/phone within reach;with chair alarm set;Other (comment) (lab staff present)  OT Visit Diagnosis: Unsteadiness on  feet (R26.81)                Time: 8493-8449 OT Time Calculation (min): 44 min Charges:  OT General Charges $OT Visit: 1 Visit OT Evaluation $OT Eval Low Complexity: 1 Low OT Treatments $Self Care/Home Management : 8-22 mins  Lamira Borin  OT/L Acute Rehabilitation Department  [phone]  04/20/2024, 5:10 PM

## 2024-04-21 DIAGNOSIS — I4821 Permanent atrial fibrillation: Secondary | ICD-10-CM | POA: Diagnosis not present

## 2024-04-21 DIAGNOSIS — K6389 Other specified diseases of intestine: Secondary | ICD-10-CM | POA: Diagnosis not present

## 2024-04-21 LAB — CBC
HCT: 27.8 % — ABNORMAL LOW (ref 36.0–46.0)
Hemoglobin: 8.5 g/dL — ABNORMAL LOW (ref 12.0–15.0)
MCH: 28.6 pg (ref 26.0–34.0)
MCHC: 30.6 g/dL (ref 30.0–36.0)
MCV: 93.6 fL (ref 80.0–100.0)
Platelets: 181 K/uL (ref 150–400)
RBC: 2.97 MIL/uL — ABNORMAL LOW (ref 3.87–5.11)
RDW: 14.6 % (ref 11.5–15.5)
WBC: 5.5 K/uL (ref 4.0–10.5)
nRBC: 0 % (ref 0.0–0.2)

## 2024-04-21 LAB — PROTIME-INR
INR: 1.3 — ABNORMAL HIGH (ref 0.8–1.2)
Prothrombin Time: 17.3 s — ABNORMAL HIGH (ref 11.4–15.2)

## 2024-04-21 NOTE — Progress Notes (Signed)
 PROGRESS NOTE    Melissa Knight  FMW:[id number] DOB: 03-Jul-1933 DOA: 04/08/2024 PCP: [name], FNP   Brief Narrative: 89 year old with past medical history significant for permanent A-fib, hypertension, hypothyroidism, embolic stroke presented to the ER complaining of abdominal pain, nausea vomiting and diarrhea since 3 days prior to admission.  She has lost at least 50 pounds over the last year.  In the ER CT abdomen and pelvis showed features concerning for colonic mass in the transverse colon concerning for colon cancer.  There is also lung nodules in the chest field concerning for possible metastasis.  Patient was admitted under hospital service, she underwent colonoscopy which showed near obstructing mass, general surgery consulted, patient underwent transverse colectomy by Dr. 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  - Underwent colonoscopy 8/2 by [name], which showed partially obstructing mass with pinpoint opening in the distal transverse proximal descending colon.  Pathology transverse colon mass hyperplastic polyp negative for dysplasia, sigmoid colon polyp traditional serrated adenoma hyperplastic polyp. - General Surgery consulted, patient underwent transfer colectomy on 8/6. - Patient was evaluated by cardiology and felt to be high risk, patient understand risk and agreed to proceed with surgery. -CT chest was negative for metastatic disease. -Surgical pathology: adenocarcinoma.  -Management per Sx -On Alvimopan  -Diet advance to  full liquid.  -Had some bloody stool and or hematuria 8/08. The morning of 8/09 has had 2 coffee color BM, watery. -Discussed with cardiology and general Sx plan to hold Anticoagulation.  -Started IV PPI BID.  Continue to hold anticoagulation and monitor hb   History of paroxysmal A-fib: Initially with supratherapeutic INR. Continue metoprolol  Heparin  was  resume 8/6 per surgery recommendations.  Had some bloody stool now black stool.  Continue to hold anticoagulation.   Melena, Vs hematochezia:  Anticoagulation stopped.  Start IV protonix .  Monitor Hb.  Transfusion as needed  Acute normocytic anemia: The setting of malignancy Monitor hb,. Trending down 10---8.9   Essential hypertension: Continue amlodipine  and metoprolol  Continue to hold lisinopril  for now  History of stroke: Continue statin, hold Coumadin  pre op  Hypokalemia Replaced.   Acquired hypothyroidism: Continue with Synthroid  Seizure disorder: Continue with Keppra   UTI was rule out   Nutrition Problem: Moderate Malnutrition Etiology: chronic illness    Signs/Symptoms: mild muscle depletion, percent weight loss (13% in 5.5 months) Percent weight loss: 13 % (in 5.5 months)    Interventions: Refer to RD note for recommendations, Boost Breeze  Estimated body mass index is 27.44 kg/m as calculated from the following:   Height as of this encounter: 5' 2 (1.575 m).   Weight as of this encounter: 68 kg.   DVT prophylaxis: heparin   Code Status: Full code Family Communication: Disposition Plan:  Status is: Inpatient Remains inpatient appropriate because: colon mass    Consultants:  GI Surgery Cardiology   Procedures:  Colonoscopy   Antimicrobials:    Subjective: She denies worsening  abdominal  pain.  Had black BM today   Objective: Vitals:   04/20/24 1414 04/20/24 2036 04/20/24 2202 04/21/24 0457  BP: 133/65  120/75 128/72 130/63  Pulse: (!) 51 64 66 62  Resp: 18 16  16   Temp: 98 F  (36.7 C) 98.2 F (36.8 C)  97.6 F (36.4 C)  TempSrc:  Oral    SpO2: 100% 100% 98% 98%  Weight:      Height:        Intake/Output Summary (Last 24 hours) at 04/21/2024 0731 Last data filed at 04/20/2024 1819 Gross per 24 hour  Intake 969.02 ml  Output --  Net 969.02 ml   Filed Weights   04/08/24 1818 04/13/24 1032 04/17/24 0736  Weight: 65.8 kg 65.8 kg 68 kg    Examination:  General exam: NAD Respiratory system: CTA Cardiovascular system: S 1, S 2 RRR Gastrointestinal system: BS present, soft, nt  clean dressing,  Central nervous system: Alert,  Extremities: no edema   Data Reviewed: I have personally reviewed following labs and imaging studies  CBC: Recent Labs  Lab 04/19/24 2346 04/20/24 0337 04/20/24 0755 04/20/24 1553 04/20/24 2257 04/21/24 0317  WBC 7.6 6.9 6.7 10.0  --  5.5  HGB 8.4* 8.5* 8.7* 9.9* 8.7* 8.5*  HCT 27.1* 28.4* 28.2* 32.1* 28.0* 27.8*  MCV 94.1 92.8 95.9 93.6  --  93.6  PLT 165 178 177 227  --  181   Basic Metabolic Panel: Recent Labs  Lab 04/17/24 0318 04/18/24 0323 04/19/24 0316  NA 138 133* 134*  K 4.1 4.6 3.8  CL 103 102 104  CO2 25 22 22   GLUCOSE 97 142* 98  BUN 7* 9 9  CREATININE 0.56 0.73 0.72  CALCIUM  8.7* 8.2* 7.9*   GFR: Estimated Creatinine Clearance: 43.1 mL/min (by C-G formula based on SCr of 0.72 mg/dL). Liver Function Tests: No results for input(s): AST, ALT, ALKPHOS, BILITOT, PROT, ALBUMIN in the last 168 hours. No results for input(s): LIPASE, AMYLASE in the last 168 hours. No results for input(s): AMMONIA in the last 168 hours. Coagulation Profile: Recent Labs  Lab 04/18/24 0323 04/19/24 0316 04/19/24 2348 04/20/24 0337 04/21/24 0317  INR 1.2 1.2 1.2 1.3* 1.3*   Cardiac Enzymes: No results for input(s): CKTOTAL, CKMB, CKMBINDEX, TROPONINI in the last 168 hours. BNP (last 3 results) No results for input(s): PROBNP in the last 8760 hours. HbA1C: No results for input(s):  HGBA1C in the last 72 hours. CBG: No results for input(s): GLUCAP in the last 168 hours. Lipid Profile: No results for input(s): CHOL, HDL, LDLCALC, TRIG, CHOLHDL, LDLDIRECT in the last 72 hours. Thyroid  Function Tests: No results for input(s): TSH, T4TOTAL, FREET4, T3FREE, THYROIDAB in the last 72 hours. Anemia Panel: No results for input(s): VITAMINB12, FOLATE, FERRITIN, TIBC, IRON, RETICCTPCT in the last 72 hours. Sepsis Labs: No results for input(s): PROCALCITON, LATICACIDVEN in the last 168 hours.  Recent Results (from the past 240 hours)  Surgical pcr screen     Status: Abnormal   Collection Time: 04/16/24 11:30 PM   Specimen: Nasal Mucosa; Nasal Swab  Result Value Ref Range Status   MRSA, PCR NEGATIVE NEGATIVE Final   Staphylococcus aureus POSITIVE (A) NEGATIVE Final    Comment: (NOTE) The Xpert SA Assay (FDA approved for NASAL specimens in patients 89 years of age and older), is one component of a comprehensive surveillance program. It is not intended to diagnose infection nor to guide or monitor treatment. Performed at [facility], [address] [place], [place] [address]          Radiology Studies: No results found.  Scheduled Meds:  acetaminophen   1,000 mg Oral Q6H   amLODipine   2.5 mg Oral Daily   atorvastatin   10 mg Oral q1800   feeding supplement  237 mL Oral BID BM   levETIRAcetam   500 mg Oral Daily   levothyroxine   88 mcg Oral Q0600   methocarbamol   500 mg Oral TID   metoprolol  tartrate  100 mg Oral Daily   And   metoprolol  tartrate  50 mg Oral QHS   mupirocin  ointment  1 Application Nasal BID   pantoprazole  (PROTONIX ) IV  40 mg Intravenous Q12H   Continuous Infusions:  sodium chloride  Stopped (04/21/24 0515)     LOS: 12 days    Time spent: 35 minutes    Tanea Moga A Damiya Sandefur, MD [facility]   If 7PM-7AM, please contact  night-coverage [email or web address]  04/21/2024, 7:31 AM

## 2024-04-21 NOTE — Progress Notes (Signed)
 Daily Progress Note   Patient Name: [name]       Date: 04/21/2024 DOB: Feb 20, 1933  Age: 89 y.o. MRN#: [id number] Attending Physician: Madelyne Owen LABOR, MD Primary Care Physician: [name], FNP Admit Date: 04/08/2024  Reason for Consultation/Follow-up: Establishing goals of care  Subjective: No acute distress, resting in bed, does not have any more bleeding. Good PO intake.    Length of Stay: 12  Current Medications: Scheduled Meds:   acetaminophen   1,000 mg Oral Q6H   amLODipine   2.5 mg Oral Daily   atorvastatin   10 mg Oral q1800   feeding supplement  237 mL Oral BID BM   levETIRAcetam   500 mg Oral Daily   levothyroxine   88 mcg Oral Q0600   methocarbamol   500 mg Oral TID   metoprolol  tartrate  100 mg Oral Daily   And   metoprolol  tartrate  50 mg Oral QHS   mupirocin  ointment  1 Application Nasal BID   pantoprazole  (PROTONIX ) IV  40 mg Intravenous Q12H    Continuous Infusions:  sodium chloride  Stopped (04/21/24 0515)    PRN Meds: HYDROmorphone  (DILAUDID ) injection, ondansetron  **OR** ondansetron  (ZOFRAN ) IV, mouth rinse, oxyCODONE , traMADol   Physical Exam         Awake alert Resting in bed No distress Regular work of breathing Mild abd tenderness  Vital Signs: BP 132/65   Pulse 62   Temp 97.6 F (36.4 C)   Resp 16   Ht 5' 2 (1.575 m)   Wt 68 kg   SpO2 98%   BMI 27.44 kg/m  SpO2: SpO2: 98 % O2 Device: O2 Device: Room Air O2 Flow Rate: O2 Flow Rate (L/min): 8 L/min  Intake/output summary:  Intake/Output Summary (Last 24 hours) at 04/21/2024 1225 Last data filed at 04/21/2024 0900 Gross per 24 hour  Intake 849.02 ml  Output --  Net 849.02 ml   LBM: Last BM Date : 04/20/24 Baseline Weight: Weight: 65.8 kg Most recent weight: Weight: 68  kg       Palliative Assessment/Data:      Patient Active Problem List   Diagnosis Date Noted   Palliative care encounter 04/14/2024   Counseling and coordination of care 04/14/2024   Goals of care, counseling/discussion 04/14/2024   Malnutrition of moderate degree 04/11/2024   Coagulopathy (HCC) 04/09/2024  Hypokalemia 04/09/2024   History of embolic stroke 04/09/2024   Colonic mass 04/08/2024   Morbid obesity (HCC) 10/05/2016   Lichen sclerosus of female genitalia 10/10/2014   Stress incontinence 10/10/2014   Hypothyroidism (acquired) 06/05/2014   Metabolic syndrome 01/01/2014   Hyperlipidemia 02/05/2013   Permanent atrial fibrillation (HCC) 07/02/2012   Late effect of cerebrovascular accident 06/30/2012   Seizure disorder, generalized convulsive, intractable (HCC) 06/30/2012   Essential hypertension 06/30/2012    Palliative Care Assessment & Plan   Patient Profile:    Assessment:  Patient Profile: Annaliese Saez is a 89 y.o. female with a hx of permanent A-fib, hypertension, hyperlipidemia, hypothyroidism, history of CVA 06/2012 and history of seizure. near-obstruction colonic mass - colonoscopy with near-obstructing distal transverse mass, biopsies pending  Cardiology consult for pre op clearance has been requested.   Recommendations/Plan:  Full code full scope care  Metastatic adenocarcinoma transverse colon - oncology also consulted.  POD 4, s/p laparotomy with partial colectomy and primary anastomosis, Dr. Eletha 8/6  PMT remains available for ongoing goals of care discussions.  Anticoagulation is being held in the interim due to bloody/dark stool.    Social and Psycho social components of care: Patient lives by herself, her husband got COVID and PNA a few months ago, which has exacerbated his mild cognitive impairment and he is now in SNF.  She has 3 sons, 2 live out of state, son Cathlyn lives in [place], [place].   Goals of Care and Additional  Recommendations: Limitations on Scope of Treatment: Full Scope Treatment  Code Status:    Code Status Orders  (From admission, onward)           Start     Ordered   04/09/24 0249  Full code  Continuous       Question:  By:  Answer:  Consent: discussion documented in EHR   04/09/24 0249           Code Status History     This patient has a current code status but no historical code status.       Prognosis:  Unable to determine  Discharge Planning: To Be Determined  Care plan was discussed with IDT  Thank you for allowing the Palliative Medicine Team to assist in the care of this patient. low MDM.      Greater than 50%  of this time was spent counseling and coordinating care related to the above assessment and plan.  Lonia Serve, MD  Please contact Palliative Medicine Team phone at [phone] for questions and concerns.

## 2024-04-21 NOTE — Plan of Care (Signed)
  Problem: Clinical Measurements: Goal: Ability to maintain clinical measurements within normal limits will improve Outcome: Progressing   Problem: Activity: Goal: Risk for activity intolerance will decrease Outcome: Progressing   Problem: Nutrition: Goal: Adequate nutrition will be maintained Outcome: Progressing   Problem: Pain Managment: Goal: General experience of comfort will improve and/or be controlled Outcome: Progressing   Problem: Activity: Goal: Ability to tolerate increased activity will improve Outcome: Progressing

## 2024-04-21 NOTE — Progress Notes (Signed)
 4 Days Post-Op   Subjective/Chief Complaint: No complaints other than some coffee-ground stools.  She is eating well and tolerating a diet.  She is ambulating   Objective: Vital signs in last 24 hours: Temp:  [97.6 F (36.4 C)-98.2 F (36.8 C)] 97.6 F (36.4 C) (08/10 0457) Pulse Rate:  [51-66] 62 (08/10 0457) Resp:  [16-18] 16 (08/10 0457) BP: (120-133)/(63-75) 130/63 (08/10 0457) SpO2:  [98 %-100 %] 98 % (08/10 0457) Last BM Date : 04/20/24  Intake/Output from previous day: 08/09 0701 - 08/10 0700 In: 969 [P.O.:360; I.V.:609] Out: -  Intake/Output this shift: No intake/output data recorded.  Exam: She appears well on exam Her abdomen is soft and nontender and her incision is healing well  Lab Results:  Recent Labs    04/20/24 1553 04/20/24 2257 04/21/24 0317  WBC 10.0  --  5.5  HGB 9.9* 8.7* 8.5*  HCT 32.1* 28.0* 27.8*  PLT 227  --  181   BMET Recent Labs    04/19/24 0316  NA 134*  K 3.8  CL 104  CO2 22  GLUCOSE 98  BUN 9  CREATININE 0.72  CALCIUM  7.9*   PT/INR Recent Labs    04/20/24 0337 04/21/24 0317  LABPROT 16.6* 17.3*  INR 1.3* 1.3*   ABG No results for input(s): PHART, HCO3 in the last 72 hours.  Invalid input(s): PCO2, PO2  Studies/Results: No results found.  Anti-infectives: Anti-infectives (From admission, onward)    Start     Dose/Rate Route Frequency Ordered Stop   04/17/24 0600  cefoTEtan  (CEFOTAN ) 2 g in sodium chloride  0.9 % 100 mL IVPB        2 g 200 mL/hr over 30 Minutes Intravenous On call to O.R. 04/16/24 0724 04/17/24 1309   04/16/24 2200  neomycin  (MYCIFRADIN ) tablet 1,000 mg       Placed in And Linked Group   1,000 mg Oral  Once 04/16/24 2047 04/16/24 2124   04/16/24 2200  metroNIDAZOLE  (FLAGYL ) tablet 1,000 mg       Placed in And Linked Group   1,000 mg Oral  Once 04/16/24 2047 04/16/24 2124   04/16/24 1400  neomycin  (MYCIFRADIN ) tablet 1,000 mg  Status:  Discontinued       Placed in And Linked  Group   1,000 mg Oral 3 times per day 04/16/24 0724 04/16/24 0725   04/16/24 1400  metroNIDAZOLE  (FLAGYL ) tablet 1,000 mg  Status:  Discontinued       Placed in And Linked Group   1,000 mg Oral 3 times per day 04/16/24 0724 04/16/24 0725   04/09/24 0430  cefTRIAXone  (ROCEPHIN ) 1 g in sodium chloride  0.9 % 100 mL IVPB  Status:  Discontinued        1 g 200 mL/hr over 30 Minutes Intravenous Every 24 hours 04/09/24 0340 04/09/24 1432       Assessment/Plan:  POD 4, s/p laparotomy with partial colectomy and primary anastomosis, Dr. Eletha 8/6   -She is likely having a small ooze from her anastomosis especially in light of her need for anticoagulation.  Her hemoglobin has drifted down.  She remains hemodynamically stable.  Anticoagulation is on hold.  This should stop without the need for any intervention.  I explained this to her. -Continue her regular diet -Repeat hemoglobin in the morning -We will continue to follow  Vicenta Poli 04/21/2024

## 2024-04-22 DIAGNOSIS — I4821 Permanent atrial fibrillation: Secondary | ICD-10-CM | POA: Diagnosis not present

## 2024-04-22 DIAGNOSIS — K6389 Other specified diseases of intestine: Secondary | ICD-10-CM | POA: Diagnosis not present

## 2024-04-22 LAB — CBC
HCT: 27.9 % — ABNORMAL LOW (ref 36.0–46.0)
Hemoglobin: 8.8 g/dL — ABNORMAL LOW (ref 12.0–15.0)
MCH: 29.2 pg (ref 26.0–34.0)
MCHC: 31.5 g/dL (ref 30.0–36.0)
MCV: 92.7 fL (ref 80.0–100.0)
Platelets: 191 K/uL (ref 150–400)
RBC: 3.01 MIL/uL — ABNORMAL LOW (ref 3.87–5.11)
RDW: 14.7 % (ref 11.5–15.5)
WBC: 7.1 K/uL (ref 4.0–10.5)
nRBC: 0 % (ref 0.0–0.2)

## 2024-04-22 LAB — PROTIME-INR
INR: 1.3 — ABNORMAL HIGH (ref 0.8–1.2)
Prothrombin Time: 16.8 s — ABNORMAL HIGH (ref 11.4–15.2)

## 2024-04-22 LAB — SURGICAL PATHOLOGY

## 2024-04-22 NOTE — Progress Notes (Signed)
 PROGRESS NOTE    Melissa Knight  FMW:[id number] DOB: 1933/04/03 DOA: 04/08/2024 PCP: [name], FNP   Brief Narrative: 89 year old with past medical history significant for permanent A-fib, hypertension, hypothyroidism, embolic stroke presented to the ER complaining of abdominal pain, nausea vomiting and diarrhea since 3 days prior to admission.  She has lost at least 50 pounds over the last year.  In the ER CT abdomen and pelvis showed features concerning for colonic mass in the transverse colon concerning for colon cancer.  There is also lung nodules in the chest field concerning for possible metastasis.  Patient was admitted under hospital service, she underwent colonoscopy which showed near obstructing mass, general surgery consulted, patient underwent transverse colectomy by Dr. 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  - Underwent colonoscopy 8/2 by [name], which showed partially obstructing mass with pinpoint opening in the distal transverse proximal descending colon.  Pathology transverse colon mass hyperplastic polyp negative for dysplasia, sigmoid colon polyp traditional serrated adenoma hyperplastic polyp. - General Surgery consulted, patient underwent transfer colectomy on 8/6. - Patient was evaluated by cardiology and felt to be high risk, patient understand risk and agreed to proceed with surgery. -CT chest was negative for metastatic disease. -Surgical pathology: adenocarcinoma.  -Management per Sx -On Alvimopan  -Diet advance to  Soft diet.  -Had some bloody stool and or hematuria 8/08. The morning of 8/09 has had 2 coffee color BM, watery. -Discussed with cardiology and general Sx plan to hold Anticoagulation. Discussed with Sx 8/11 plan to hold anticoagulation for 1 week .  -Continue with  IV PPI BID.  Continue to hold anticoagulation and monitor hb. Hb stable.  Nurse report small amount of blood per night shift last night   History of paroxysmal A-fib: Initially with supratherapeutic INR. Continue metoprolol  Heparin  was  resume 8/6 per surgery recommendations.  Had some bloody stool now black stool.  Continue to hold anticoagulation. Discussed with Sx hold it for one week.   Melena, Vs hematochezia: Oozing from Sx  Anticoagulation stopped.  Continue with IV protonix .  Monitor Hb. Stable today at 8,.6 Transfusion as needed  Acute normocytic anemia: The setting of malignancy Monitor hb,. Trending down 10---8.9   Essential hypertension: Continue amlodipine  and metoprolol  Continue to hold lisinopril  for now  History of stroke: Continue statin, hold Coumadin  pre op  Hypokalemia Replaced.   Acquired hypothyroidism: Continue with Synthroid  Seizure disorder: Continue with Keppra   UTI was rule out   Nutrition Problem: Moderate Malnutrition Etiology: chronic illness    Signs/Symptoms: mild muscle depletion, percent weight loss (13% in 5.5 months) Percent weight loss: 13 % (in 5.5 months)    Interventions: Refer to RD note for recommendations, Boost Breeze  Estimated body mass index is 27.44 kg/m as calculated from the following:   Height as of this encounter: 5' 2 (1.575 m).   Weight as of this encounter: 68 kg.   DVT prophylaxis: heparin   Code Status: Full code Family Communication: Disposition Plan:  Status is: Inpatient Remains inpatient appropriate because: colon mass    Consultants:  GI  Surgery Cardiology   Procedures:  Colonoscopy   Antimicrobials:     Subjective: Doing well, tolerating diet. No BM when I saw her today. Per nurse patient had small bloody stool last night.    Objective: Vitals:   04/21/24 1328 04/21/24 2032 04/22/24 0456 04/22/24 0756  BP: 139/67 129/76 127/64 132/76  Pulse: 62 (!) 58 64 67  Resp: 16 17 16 15   Temp: 98.1 F (36.7 C) 98.3 F (36.8 C) 98.2 F (36.8 C)   TempSrc: Oral Oral Oral   SpO2: 98% 98% 99% 98%  Weight:      Height:        Intake/Output Summary (Last 24 hours) at 04/22/2024 1009 Last data filed at 04/21/2024 1328 Gross per 24 hour  Intake 240 ml  Output --  Net 240 ml   Filed Weights   04/08/24 1818 04/13/24 1032 04/17/24 0736  Weight: 65.8 kg 65.8 kg 68 kg    Examination:  General exam: NAD Respiratory system: CTA Cardiovascular system: S 1, S 2 RRR Gastrointestinal system: BS present, soft, nt, clean dressing Central nervous system: Alert, follows command Extremities: no edema   Data Reviewed: I have personally reviewed following labs and imaging studies  CBC: Recent Labs  Lab 04/20/24 0337 04/20/24 0755 04/20/24 1553 04/20/24 2257 04/21/24 0317 04/22/24 0327  WBC 6.9 6.7 10.0  --  5.5 7.1  HGB 8.5* 8.7* 9.9* 8.7* 8.5* 8.8*  HCT 28.4* 28.2* 32.1* 28.0* 27.8* 27.9*  MCV 92.8 95.9 93.6  --  93.6 92.7  PLT 178 177 227  --  181 191   Basic Metabolic Panel: Recent Labs  Lab 04/17/24 0318 04/18/24 0323 04/19/24 0316  NA 138 133* 134*  K 4.1 4.6 3.8  CL 103 102 104  CO2 25 22 22   GLUCOSE 97 142* 98  BUN 7* 9 9  CREATININE 0.56 0.73 0.72  CALCIUM  8.7* 8.2* 7.9*   GFR: Estimated Creatinine Clearance: 43.1 mL/min (by C-G formula based on SCr of 0.72 mg/dL). Liver Function Tests: No results for input(s): AST, ALT, ALKPHOS, BILITOT, PROT, ALBUMIN in the last 168 hours. No results for input(s): LIPASE, AMYLASE in the last 168 hours. No results for input(s): AMMONIA in the last 168 hours. Coagulation Profile: Recent Labs  Lab  04/19/24 0316 04/19/24 2348 04/20/24 0337 04/21/24 0317 04/22/24 0327  INR 1.2 1.2 1.3* 1.3* 1.3*   Cardiac Enzymes: No results for input(s): CKTOTAL, CKMB, CKMBINDEX, TROPONINI in the last 168 hours. BNP (last 3 results) No results for input(s): PROBNP in the last 8760 hours. HbA1C: No results for input(s): HGBA1C in the last 72 hours. CBG: No results for input(s): GLUCAP in the last 168 hours. Lipid Profile: No results for input(s): CHOL, HDL, LDLCALC, TRIG, CHOLHDL, LDLDIRECT in the last 72 hours. Thyroid  Function Tests: No results for input(s): TSH, T4TOTAL, FREET4, T3FREE, THYROIDAB in the last 72 hours. Anemia Panel: No results for input(s): VITAMINB12, FOLATE, FERRITIN, TIBC, IRON, RETICCTPCT in the last 72 hours. Sepsis Labs: No results for input(s): PROCALCITON, LATICACIDVEN in the last 168 hours.  Recent Results (from the past 240 hours)  Surgical pcr screen     Status: Abnormal   Collection Time: 04/16/24 11:30 PM   Specimen: Nasal Mucosa; Nasal Swab  Result Value Ref Range Status   MRSA, PCR NEGATIVE NEGATIVE Final   Staphylococcus aureus POSITIVE (A) NEGATIVE Final    Comment: (NOTE) The Xpert SA Assay (FDA approved for NASAL specimens in patients 3 years of age and older), is  one component of a comprehensive surveillance program. It is not intended to diagnose infection nor to guide or monitor treatment. Performed at [facility], [address] [place], [place] [address]          Radiology Studies: No results found.       Scheduled Meds:  acetaminophen   1,000 mg Oral Q6H   amLODipine   2.5 mg Oral Daily   atorvastatin   10 mg Oral q1800   feeding supplement  237 mL Oral BID BM   levETIRAcetam   500 mg Oral Daily   levothyroxine   88 mcg Oral Q0600   methocarbamol   500 mg Oral TID   metoprolol  tartrate  100 mg Oral Daily   And   metoprolol  tartrate  50 mg Oral QHS    pantoprazole  (PROTONIX ) IV  40 mg Intravenous Q12H   Continuous Infusions:     LOS: 13 days    Time spent: 35 minutes    Rashia Mckesson A Johari Pinney, MD [facility]   If 7PM-7AM, please contact night-coverage [email or web address]  04/22/2024, 10:09 AM

## 2024-04-22 NOTE — Progress Notes (Addendum)
 Progress Note  5 Days Post-Op  Subjective: Patient feeling well overall. Reports no pain. Having bowel movements that are more formed. Having flatulence. Denies nausea/vomiting.   ROS  All negative with the exception of above.  Objective: Vital signs in last 24 hours: Temp:  [98.1 F (36.7 C)-98.3 F (36.8 C)] 98.2 F (36.8 C) (08/11 0456) Pulse Rate:  [58-67] 67 (08/11 0756) Resp:  [15-17] 15 (08/11 0756) BP: (127-139)/(64-76) 132/76 (08/11 0756) SpO2:  [98 %-99 %] 98 % (08/11 0756) Last BM Date : 04/21/24  Intake/Output from previous day: 08/10 0701 - 08/11 0700 In: 480 [P.O.:480] Out: -  Intake/Output this shift: No intake/output data recorded.  PE: General: Pleasant, female who is laying in bed in NAD. Lungs: Respiratory effort nonlabored Abd: Soft, NT, ND. Midline incision C/D/I. Honeycomb dressing removed. Psych: A&Ox3 with an appropriate affect.    Lab Results:  Recent Labs    04/21/24 0317 04/22/24 0327  WBC 5.5 7.1  HGB 8.5* 8.8*  HCT 27.8* 27.9*  PLT 181 191   BMET No results for input(s): NA, K, CL, CO2, GLUCOSE, BUN, CREATININE, CALCIUM  in the last 72 hours. PT/INR Recent Labs    04/21/24 0317 04/22/24 0327  LABPROT 17.3* 16.8*  INR 1.3* 1.3*   CMP     Component Value Date/Time   NA 134 (L) 04/19/2024 0316   NA 144 05/08/2023 1218   K 3.8 04/19/2024 0316   CL 104 04/19/2024 0316   CO2 22 04/19/2024 0316   GLUCOSE 98 04/19/2024 0316   BUN 9 04/19/2024 0316   BUN 12 05/08/2023 1218   CREATININE 0.72 04/19/2024 0316   CREATININE 0.69 01/30/2013 1117   CALCIUM  7.9 (L) 04/19/2024 0316   PROT 5.1 (L) 04/09/2024 0320   PROT 5.8 (L) 05/08/2023 1218   ALBUMIN 2.7 (L) 04/09/2024 0320   ALBUMIN 3.8 05/08/2023 1218   AST 14 (L) 04/09/2024 0320   ALT 9 04/09/2024 0320   ALKPHOS 27 (L) 04/09/2024 0320   BILITOT 1.0 04/09/2024 0320   BILITOT 0.5 05/08/2023 1218   GFRNONAA >60 04/19/2024 0316   GFRNONAA 84 01/30/2013 1117    GFRAA 89 11/03/2020 1243   GFRAA >89 01/30/2013 1117   Lipase     Component Value Date/Time   LIPASE 15 04/08/2024 1822       Studies/Results: No results found.  Anti-infectives: Anti-infectives (From admission, onward)    Start     Dose/Rate Route Frequency Ordered Stop   04/17/24 0600  cefoTEtan  (CEFOTAN ) 2 g in sodium chloride  0.9 % 100 mL IVPB        2 g 200 mL/hr over 30 Minutes Intravenous On call to O.R. 04/16/24 0724 04/17/24 1309   04/16/24 2200  neomycin  (MYCIFRADIN ) tablet 1,000 mg       Placed in And Linked Group   1,000 mg Oral  Once 04/16/24 2047 04/16/24 2124   04/16/24 2200  metroNIDAZOLE  (FLAGYL ) tablet 1,000 mg       Placed in And Linked Group   1,000 mg Oral  Once 04/16/24 2047 04/16/24 2124   04/16/24 1400  neomycin  (MYCIFRADIN ) tablet 1,000 mg  Status:  Discontinued       Placed in And Linked Group   1,000 mg Oral 3 times per day 04/16/24 0724 04/16/24 0725   04/16/24 1400  metroNIDAZOLE  (FLAGYL ) tablet 1,000 mg  Status:  Discontinued       Placed in And Linked Group   1,000 mg Oral 3 times per day  04/16/24 0724 04/16/24 0725   04/09/24 0430  cefTRIAXone  (ROCEPHIN ) 1 g in sodium chloride  0.9 % 100 mL IVPB  Status:  Discontinued        1 g 200 mL/hr over 30 Minutes Intravenous Every 24 hours 04/09/24 0340 04/09/24 1432        Assessment/Plan POD 5, s/p laparotomy with partial colectomy and primary anastomosis, Dr. Eletha 8/6 -Patient appears well on exam. -Vitals stable. Afebrile -WBC 7.1; Hemoglobin 8.8 - Trends stable -PT 16.8 from 17.3; INR 1.3 -Continue to hold anticoagulation.  -Continue with soft to regular diet. -Discussed post-operative restrictions. Will arrange for RN visit to have staples removed in about 10 days. Will arrange for follow up with Dr. Eletha in 4 weeks outpatient. Will sign off at this time. Please call for additional concerns or questions.   FEN: Ensure, soft VTE: SCDs ID: None currently    LOS: 13 days    I reviewed specialist notes, hospitalist notes, last 24 h vitals and pain scores, last 48 h intake and output, last 24 h labs and trends, and last 24 h imaging results.   ADDENDUM: Informed by hospitalist of bloody stools. Discussed with MD. Hemoglobin reviewed and has been stable overall for 3 days. We will continue to follow.   Marjorie Carlyon Favre, [facility] 04/22/2024, 11:47 AM Please see Amion for pager number during day hours 7:00am-4:30pm

## 2024-04-22 NOTE — Progress Notes (Signed)
 Occupational Therapy Treatment Patient Details Name: Melissa Knight MRN: [id number] DOB: 08-22-1933 Today's Date: 04/22/2024   History of present illness 89 yo female admitted 04/08/24   complaining of abdominal pain, nausea vomiting and diarrhea. CT abdomen and pelvis showed features concerning for colonic mass in the transverse colon concerning for colon cancer.  S/P laparotomy with partial colectomy and primary anastomosis, Dr. Eletha 8/6. past medical history significant for permanent A-fib, hypertension, hypothyroidism, embolic stroke   OT comments  Patient seen for skilled OT session. Appreciate chart notes from team re: needs and d/c plan. Patient open to all therapy presented. Issued and trained in 5 P's of energy conservation with recommendation for showering seated (info provided) and use of reacher for falls prevention which patient has in home. See below for current status. Agree with plan for home with family support and HHOT services however patient feels home  therapy not needed at this time. Patient requires continued Acute care hospital level OT services to progress safety and functional performance and allow for discharge.        If plan is discharge home, recommend the following:  A little help with walking and/or transfers;A little help with bathing/dressing/bathroom;Assistance with cooking/housework;Assist for transportation;Help with stairs or ramp for entrance   Equipment Recommendations  BSC/3in1;Tub/shower seat (son purchasing commode and patient will explore shower seat post discharge based on handout provided by OT)       Precautions / Restrictions Precautions Precautions: Fall Recall of Precautions/Restrictions: Intact Precaution/Restrictions Comments: abd surgery Restrictions Weight Bearing Restrictions Per Provider Order: No       Mobility Bed Mobility Overal bed mobility:  (patient in recliner and remained)                  Transfers Overall  transfer level: Needs assistance Equipment used: Rolling walker (2 wheels), None Transfers: Sit to/from Stand, Bed to chair/wheelchair/BSC Sit to Stand: Supervision     Step pivot transfers: Supervision     General transfer comment: amb with supervision and completed toileting on regular toilet     Balance Overall balance assessment: No apparent balance deficits (not formally assessed)                                         ADL either performed or assessed with clinical judgement   ADL Overall ADL's : Needs assistance/impaired Eating/Feeding: Independent   Grooming: Wash/dry hands;Wash/dry face;Standing;Modified independent   Upper Body Bathing: Modified independent;Sitting   Lower Body Bathing: Sit to/from stand;Sitting/lateral leans;Supervison/ safety   Upper Body Dressing : Modified independent;Sitting   Lower Body Dressing: Sit to/from stand;Supervision/safety   Toilet Transfer: Supervision/safety;Rolling walker (2 wheels)   Toileting- Clothing Manipulation and Hygiene: Modified independent   Tub/ Forensic scientist Details (indicate cue type and reason): rec shower seat and patient will explore on her own with handout provided Functional mobility during ADLs: Supervision/safety;Rolling walker (2 wheels) General ADL Comments: bathroom level ADL's completed with no pain or SOB; Issued and trained in 5 P's of ECT with + teach back post education during bathroom level ADL's.     Extremity/Trunk Assessment Upper Extremity Assessment Upper Extremity Assessment: Right hand dominant;Overall [facility] for tasks assessed   Lower Extremity Assessment Lower Extremity Assessment: Overall WFL for tasks assessed                 Communication Communication Communication: No  apparent difficulties   Cognition Arousal: Alert Behavior During Therapy: WFL for tasks assessed/performed Cognition: No apparent impairments                                Following commands: Intact        Cueing   Cueing Techniques: Verbal cues        General Comments no edema or skin issues noted    Pertinent Vitals/ Pain       Pain Assessment Pain Assessment: No/denies pain   Frequency  Min 2X/week        Progress Toward Goals  OT Goals(current goals can now be found in the care plan section)  Progress towards OT goals: Progressing toward goals  Acute Rehab OT Goals Patient Stated Goal: to go home tonight OT Goal Formulation: With patient Time For Goal Achievement: 05/04/24 Potential to Achieve Goals: Good ADL Goals Pt Will Perform Lower Body Bathing: with supervision;sit to/from stand Pt Will Perform Lower Body Dressing: with supervision;sit to/from stand Pt Will Transfer to Toilet: with supervision;ambulating;regular height toilet Pt Will Perform Toileting - Clothing Manipulation and hygiene: with supervision;sit to/from stand Pt Will Perform Tub/Shower Transfer: Tub transfer;shower seat;ambulating;with supervision Pt/caregiver will Perform Home Exercise Program: Increased strength;Both right and left upper extremity;Independently;With written HEP provided Additional ADL Goal #1: Patient will teach back and integrate 5 P's of ECT with BADL's and mobility  Plan         AM-PAC OT 6 Clicks Daily Activity     Outcome Measure   Help from another person eating meals?: None Help from another person taking care of personal grooming?: None Help from another person toileting, which includes using toliet, bedpan, or urinal?: A Little Help from another person bathing (including washing, rinsing, drying)?: A Little Help from another person to put on and taking off regular upper body clothing?: None Help from another person to put on and taking off regular lower body clothing?: A Little 6 Click Score: 21    End of Session Equipment Utilized During Treatment: Gait belt;Rolling walker (2  wheels)  OT Visit Diagnosis: Unsteadiness on feet (R26.81)   Activity Tolerance Patient tolerated treatment well   Patient Left in chair;with call bell/phone within reach;with chair alarm set   Nurse Communication Mobility status        Time: 8441-8369 OT Time Calculation (min): 32 min  Charges: OT General Charges $OT Visit: 1 Visit OT Treatments $Self Care/Home Management : 8-22 mins $Therapeutic Activity: 8-22 mins  Gabriana Wilmott OT/L Acute Rehabilitation Department  [phone]  04/22/2024, 4:36 PM

## 2024-04-22 NOTE — Plan of Care (Signed)
  Problem: Coping: Goal: Level of anxiety will decrease Outcome: Progressing   Problem: Elimination: Goal: Will not experience complications related to bowel motility Outcome: Progressing Goal: Will not experience complications related to urinary retention Outcome: Progressing   Problem: Pain Managment: Goal: General experience of comfort will improve and/or be controlled Outcome: Progressing

## 2024-04-22 NOTE — Progress Notes (Signed)
 Physical Therapy Treatment Patient Details Name: Melissa Knight MRN: [id number] DOB: Jun 05, 1933 Today's Date: 04/22/2024   History of Present Illness 89 yo female admitted 04/08/24   complaining of abdominal pain, nausea vomiting and diarrhea. CT abdomen and pelvis showed features concerning for colonic mass in the transverse colon concerning for colon cancer.  S/P laparotomy with partial colectomy and primary anastomosis, Dr. Eletha 8/6. past medical history significant for permanent A-fib, hypertension, hypothyroidism, embolic stroke    PT Comments  Pt is making excellent progress toward PT goals. Pt states she wants to go home. Reports her church family will be checking on her quite a bit, providing meals etc. Amb distance to tolerance, pt using RW for majority of distance, abel to amb short distance without device as well as ascend/descend stairs with supervision for safety. Pt would be appropriate to d/c home with HHPT from PT standpoint, RW for home.    If plan is discharge home, recommend the following: A little help with bathing/dressing/bathroom;Assistance with cooking/housework;Assist for transportation;Help with stairs or ramp for entrance   Can travel by private vehicle        Equipment Recommendations  Rolling walker (2 wheels)    Recommendations for Other Services       Precautions / Restrictions Precautions Precautions: Fall Recall of Precautions/Restrictions: Intact Precaution/Restrictions Comments: abd surgery Restrictions Weight Bearing Restrictions Per Provider Order: No     Mobility  Bed Mobility Overal bed mobility: Independent                  Transfers   Equipment used: Rolling walker (2 wheels), None Transfers: Sit to/from Stand Sit to Stand: Supervision           General transfer comment: initial cues for hand placement. no physical assist    Ambulation/Gait Ambulation/Gait assistance: Supervision Gait Distance (Feet): 220 Feet Assistive  device: Rolling walker (2 wheels), None Gait Pattern/deviations: Step-through pattern, Trunk flexed Gait velocity: decreased but improving, functional     General Gait Details: ongoing education for RW position and trunk extension as able. pt steady with light use of RW, no LOB. able to amb short distance in room without device   Stairs Stairs: Yes Stairs assistance: Contact guard assist, Supervision Stair Management: Two rails, Forwards, Alternating pattern Number of Stairs: 3 (2/3) General stair comments: supervision for safety, no LOB, no physical assist   Wheelchair Mobility     Tilt Bed    Modified Rankin (Stroke Patients Only)       Balance                                            Communication Communication Communication: No apparent difficulties  Cognition Arousal: Alert Behavior During Therapy: WFL for tasks assessed/performed   PT - Cognitive impairments: No apparent impairments                         Following commands: Intact      Cueing Cueing Techniques: Verbal cues  Exercises      General Comments        Pertinent Vitals/Pain Pain Assessment Pain Assessment: No/denies pain    Home Living                          Prior Function  PT Goals (current goals can now be found in the care plan section) Acute Rehab PT Goals Patient Stated Goal: go home PT Goal Formulation: With patient Time For Goal Achievement: 05/02/24 Potential to Achieve Goals: Good Progress towards PT goals: Progressing toward goals    Frequency    Min 2X/week      PT Plan      Co-evaluation              AM-PAC PT 6 Clicks Mobility   Outcome Measure  Help needed turning from your back to your side while in a flat bed without using bedrails?: A Little Help needed moving from lying on your back to sitting on the side of a flat bed without using bedrails?: A Little Help needed moving to and from a  bed to a chair (including a wheelchair)?: A Little Help needed standing up from a chair using your arms (e.g., wheelchair or bedside chair)?: A Little Help needed to walk in hospital room?: A Little Help needed climbing 3-5 steps with a railing? : A Little 6 Click Score: 18    End of Session Equipment Utilized During Treatment: Gait belt Activity Tolerance: Patient tolerated treatment well Patient left: in chair;with call bell/phone within reach;with chair alarm set Nurse Communication: Mobility status PT Visit Diagnosis: Unsteadiness on feet (R26.81);Difficulty in walking, not elsewhere classified (R26.2)     Time: 1100-1118 PT Time Calculation (min) (ACUTE ONLY): 18 min  Charges:    $Gait Training: 8-22 mins PT General Charges $$ ACUTE PT VISIT: 1 Visit                     Lorcan Shelp, PT  Acute Rehab Dept [facility]) [phone]  04/22/2024    [facility] 04/22/2024, 11:26 AM

## 2024-04-22 NOTE — Progress Notes (Signed)
 Daily Progress Note   Patient Name: Melissa Knight       Date: 04/22/2024 DOB: 1933-06-08  Age: 89 y.o. MRN#: [id number] Attending Physician: Madelyne Owen LABOR, MD Primary Care Physician: [name], FNP Admit Date: 04/08/2024  Reason for Consultation/Follow-up: Establishing goals of care  Subjective: No acute distress, resting in bed,no distress, tolerating PT   Length of Stay: 13  Current Medications: Scheduled Meds:   acetaminophen   1,000 mg Oral Q6H   amLODipine   2.5 mg Oral Daily   atorvastatin   10 mg Oral q1800   feeding supplement  237 mL Oral BID BM   levETIRAcetam   500 mg Oral Daily   levothyroxine   88 mcg Oral Q0600   methocarbamol   500 mg Oral TID   metoprolol  tartrate  100 mg Oral Daily   And   metoprolol  tartrate  50 mg Oral QHS   pantoprazole  (PROTONIX ) IV  40 mg Intravenous Q12H    Continuous Infusions:    PRN Meds: HYDROmorphone  (DILAUDID ) injection, ondansetron  **OR** ondansetron  (ZOFRAN ) IV, mouth rinse, oxyCODONE , traMADol   Physical Exam         Awake alert Resting in bed No distress Regular work of breathing Mild abd tenderness  Vital Signs: BP 132/76 (BP Location: Right Arm)   Pulse 67   Temp 98.2 F (36.8 C) (Oral)   Resp 15   Ht 5' 2 (1.575 m)   Wt 68 kg   SpO2 98%   BMI 27.44 kg/m  SpO2: SpO2: 98 % O2 Device: O2 Device: Room Air O2 Flow Rate: O2 Flow Rate (L/min): 8 L/min  Intake/output summary:  Intake/Output Summary (Last 24 hours) at 04/22/2024 1400 Last data filed at 04/22/2024 1000 Gross per 24 hour  Intake 0 ml  Output --  Net 0 ml   LBM: Last BM Date : 04/21/24 Baseline Weight: Weight: 65.8 kg Most recent weight: Weight: 68 kg       Palliative Assessment/Data:      Patient Active Problem List    Diagnosis Date Noted   Palliative care encounter 04/14/2024   Counseling and coordination of care 04/14/2024   Goals of care, counseling/discussion 04/14/2024   Malnutrition of moderate degree 04/11/2024   Coagulopathy (HCC) 04/09/2024   Hypokalemia 04/09/2024   History of embolic stroke 04/09/2024   Colonic mass 04/08/2024   Morbid  obesity (HCC) 10/05/2016   Lichen sclerosus of female genitalia 10/10/2014   Stress incontinence 10/10/2014   Hypothyroidism (acquired) 06/05/2014   Metabolic syndrome 01/01/2014   Hyperlipidemia 02/05/2013   Permanent atrial fibrillation (HCC) 07/02/2012   Late effect of cerebrovascular accident 06/30/2012   Seizure disorder, generalized convulsive, intractable (HCC) 06/30/2012   Essential hypertension 06/30/2012    Palliative Care Assessment & Plan   Patient Profile:    Assessment:  Patient Profile: Melissa Knight is a 89 y.o. female with a hx of permanent A-fib, hypertension, hyperlipidemia, hypothyroidism, history of CVA 06/2012 and history of seizure. near-obstruction colonic mass - colonoscopy with near-obstructing distal transverse mass, biopsies pending  Cardiology consult for pre op clearance has been requested.   Recommendations/Plan:  Full code full scope care  Metastatic adenocarcinoma transverse colon - oncology also consulted.  s/p laparotomy with partial colectomy and primary anastomosis, Dr. Eletha 8/6   Social and Psycho social components of care: Patient lives by herself, her husband got COVID and PNA a few months ago, which has exacerbated his mild cognitive impairment and he is now in SNF.  She has 3 sons, 2 live out of state, son Cathlyn lives in [place], [place].   04-22-24: Recommend palliative care at the cancer center for ongoing goals of care follow up.   Goals of Care and Additional Recommendations: Limitations on Scope of Treatment: Full Scope Treatment  Code Status:    Code Status Orders  (From admission, onward)            Start     Ordered   04/09/24 0249  Full code  Continuous       Question:  By:  Answer:  Consent: discussion documented in EHR   04/09/24 0249           Code Status History     This patient has a current code status but no historical code status.       Prognosis:  guarded  Discharge Planning: Likely home with home health.   Care plan was discussed with IDT  Thank you for allowing the Palliative Medicine Team to assist in the care of this patient. low MDM.      Greater than 50%  of this time was spent counseling and coordinating care related to the above assessment and plan.  Lonia Serve, MD  Please contact Palliative Medicine Team phone at [phone] for questions and concerns.

## 2024-04-22 NOTE — Plan of Care (Signed)

## 2024-04-22 NOTE — TOC Transition Note (Signed)
 Transition of Care [facility]) - Discharge Note   Patient Details  Name: Melissa Knight MRN: [id number] Date of Birth: Apr 13, 1933  Transition of Care [facility]) CM/SW Contact:  NORMAN ASPEN, LCSW Phone Number: 04/22/2024, 3:53 PM   Clinical Narrative:     Met with pt today to discuss dc planning needs.  Pt feels that she has made significant gains with mobility and ADLs over past few days.  Per PT, confirming gains and recommendations for dc home.  HHPT had been recommended last week, however, pt does not feel she needs this.  She reports that her son/ his family are very supportive and she feels safe with dc home alone.  She notes she DOES need RW and no DME agency preference - order placed with [employer] for delivery to room.  No further TOC needs noted but will continue to follow.  Final next level of care: Home/Self Care Barriers to Discharge: No Barriers Identified   Patient Goals and CMS Choice Patient states their goals for this hospitalization and ongoing recovery are:: return home          Discharge Placement                       Discharge Plan and Services Additional resources added to the After Visit Summary for                  DME Arranged: [facility] DME Agency: [employer] Date DME Agency Contacted: 04/22/24 Time DME Agency Contacted: 1500 Representative spoke with at DME Agency: Darlyn            Social Drivers of Health (SDOH) Interventions SDOH Screenings   Food Insecurity: No Food Insecurity (04/09/2024)  Housing: Low Risk  (04/09/2024)  Transportation Needs: No Transportation Needs (04/09/2024)  Utilities: Not At Risk (04/09/2024)  Alcohol Screen: Low Risk  (08/31/2023)  Depression (PHQ2-9): Low Risk  (03/25/2024)  Financial Resource Strain: Low Risk  (10/26/2023)  Physical Activity: Inactive (10/26/2023)  Social Connections: Socially Integrated (04/09/2024)  Stress: No Stress Concern Present (10/26/2023)  Tobacco Use: Medium Risk (04/17/2024)  Health  Literacy: Adequate Health Literacy (08/31/2023)     Readmission Risk Interventions    04/10/2024    1:23 PM  Readmission Risk Prevention Plan  Transportation Screening Complete  PCP or Specialist Appt within 5-7 Days Complete  Home Care Screening Complete  Medication Review (RN CM) Complete

## 2024-04-23 ENCOUNTER — Telehealth: Payer: Self-pay

## 2024-04-23 DIAGNOSIS — I4821 Permanent atrial fibrillation: Secondary | ICD-10-CM | POA: Diagnosis not present

## 2024-04-23 DIAGNOSIS — K6389 Other specified diseases of intestine: Secondary | ICD-10-CM | POA: Diagnosis not present

## 2024-04-23 LAB — PROTIME-INR
INR: 1.1 (ref 0.8–1.2)
Prothrombin Time: 15.3 s — ABNORMAL HIGH (ref 11.4–15.2)

## 2024-04-23 LAB — CBC
HCT: 28.2 % — ABNORMAL LOW (ref 36.0–46.0)
Hemoglobin: 9 g/dL — ABNORMAL LOW (ref 12.0–15.0)
MCH: 29.3 pg (ref 26.0–34.0)
MCHC: 31.9 g/dL (ref 30.0–36.0)
MCV: 91.9 fL (ref 80.0–100.0)
Platelets: 218 K/uL (ref 150–400)
RBC: 3.07 MIL/uL — ABNORMAL LOW (ref 3.87–5.11)
RDW: 14.9 % (ref 11.5–15.5)
WBC: 6.4 K/uL (ref 4.0–10.5)
nRBC: 0 % (ref 0.0–0.2)

## 2024-04-23 LAB — BASIC METABOLIC PANEL WITH GFR
Anion gap: 6 (ref 5–15)
BUN: 10 mg/dL (ref 8–23)
CO2: 25 mmol/L (ref 22–32)
Calcium: 8.3 mg/dL — ABNORMAL LOW (ref 8.9–10.3)
Chloride: 107 mmol/L (ref 98–111)
Creatinine, Ser: 0.61 mg/dL (ref 0.44–1.00)
GFR, Estimated: >60 mL/min (ref >60)
Glucose, Bld: 89 mg/dL (ref 70–99)
Potassium: 3.6 mmol/L (ref 3.5–5.1)
Sodium: 138 mmol/L (ref 135–145)

## 2024-04-23 NOTE — Telephone Encounter (Signed)
 Attempted to call son. Someone picked up call but could not hear me. Will try again

## 2024-04-23 NOTE — Progress Notes (Signed)
 PROGRESS NOTE    Melissa Knight  FMW:[id number] DOB: [birth year] DOA: 04/08/2024 PCP: [name], FNP   Brief Narrative: 89 year old with past medical history significant for permanent A-fib, hypertension, hypothyroidism, embolic stroke presented to the ER complaining of abdominal pain, nausea vomiting and diarrhea since 3 days prior to admission.  She has lost at least 50 pounds over the last year.  In the ER CT abdomen and pelvis showed features concerning for colonic mass in the transverse colon concerning for colon cancer.  There is also lung nodules in the chest field concerning for possible metastasis.  Patient was admitted under hospital service, she underwent colonoscopy which showed near obstructing mass, general surgery consulted, patient underwent transverse colectomy by Dr. Krystal Budge on 8/6  Patient has been able to tolerate diet. She develop melena and  blood in the stool/ . Heparin  Coumadin  have been discontinue. Plan is to hold anticoagulation for 1-or 2 weeks per surgery. Hb has remain stable. Had black stool today but hb stable.   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  - Underwent colonoscopy 8/2 by [name], which showed partially obstructing mass with pinpoint opening in the distal transverse proximal descending colon.  Pathology transverse colon mass hyperplastic polyp negative for dysplasia, sigmoid colon polyp traditional serrated adenoma hyperplastic polyp. - General Surgery consulted, patient underwent transfer colectomy on 8/6. - Patient was  evaluated by cardiology and felt to be high risk, patient understand risk and agreed to proceed with surgery. -CT chest was negative for metastatic disease. -Surgical pathology: adenocarcinoma.  -Management per Sx -On Alvimopan  -Diet advance to Soft diet.  -Had some bloody stool and or hematuria 8/08. The morning of 8/09 has had 2 coffee color BM, watery. -Discussed with cardiology and general Sx plan to hold Anticoagulation. Discussed with Sx 8/11 plan to hold anticoagulation for 1-2 week .  -Continue with  IV PPI BID.  Continue to hold anticoagulation and monitor hb. Hb stable.  8/10: Nurse report small amount of blood per night shift last night  Only had one BM today, no BM yesterday, stool still black.   History of paroxysmal A-fib: Initially with supratherapeutic INR. Continue metoprolol  Heparin  was  resume 8/6 per surgery recommendations.  Had some bloody stool now black stool.  Continue to hold anticoagulation. Discussed with Sx hold it for one week.   Melena, Vs hematochezia: Oozing from Sx  Anticoagulation stopped.  Continue with IV protonix .  Monitor Hb. Stable today at 8,.6--9 Transfusion as needed  Acute normocytic anemia: The setting of malignancy Monitor hb,. Trending down 10---8.9   Essential hypertension: Continue amlodipine  and metoprolol  Continue to hold lisinopril  for now  History of stroke: Continue statin, hold Coumadin  pre op  Hypokalemia Replaced.   Acquired hypothyroidism: Continue with Synthroid  Seizure disorder: Continue with Keppra   UTI was rule out   Nutrition Problem: Moderate Malnutrition Etiology: chronic illness    Signs/Symptoms: mild muscle depletion, percent weight loss (13% in 5.5 months) Percent weight loss: 13 % (in 5.5 months)    Interventions: Refer to RD note for recommendations, Boost Breeze  Estimated body mass index  is 27.44 kg/m as calculated from the following:   Height as of this encounter: 5' 2 (1.575 m).    Weight as of this encounter: 68 kg.   DVT prophylaxis: heparin   Code Status: Full code Family Communication: Son 8/12 Disposition Plan:  Status is: Inpatient Remains inpatient appropriate because: colon mass    Consultants:  GI Surgery Cardiology   Procedures:  Colonoscopy   Antimicrobials:    Subjective: She is doing well, didn't had BM yesterday. Had Black BM today     Objective: Vitals:   04/22/24 2030 04/22/24 2221 04/23/24 0405 04/23/24 0926  BP: 125/74  130/73 116/62  Pulse: 64 64 (!) 54 (!) 59  Resp: 17  16   Temp: 98.3 F (36.8 C)  98.1 F (36.7 C)   TempSrc: Oral  Oral   SpO2: 97%  98%   Weight:      Height:        Intake/Output Summary (Last 24 hours) at 04/23/2024 1538 Last data filed at 04/23/2024 0950 Gross per 24 hour  Intake 760 ml  Output --  Net 760 ml   Filed Weights   04/08/24 1818 04/13/24 1032 04/17/24 0736  Weight: 65.8 kg 65.8 kg 68 kg    Examination:  General exam: NAD Respiratory system: CTA Cardiovascular system: S 1, S 2 RRR Gastrointestinal system: BS present, soft, nt, staples in placed.  Central nervous system: Alert, follows command Extremities: no edema   Data Reviewed: I have personally reviewed following labs and imaging studies  CBC: Recent Labs  Lab 04/20/24 0755 04/20/24 1553 04/20/24 2257 04/21/24 0317 04/22/24 0327 04/23/24 0316  WBC 6.7 10.0  --  5.5 7.1 6.4  HGB 8.7* 9.9* 8.7* 8.5* 8.8* 9.0*  HCT 28.2* 32.1* 28.0* 27.8* 27.9* 28.2*  MCV 95.9 93.6  --  93.6 92.7 91.9  PLT 177 227  --  181 191 218   Basic Metabolic Panel: Recent Labs  Lab 04/17/24 0318 04/18/24 0323 04/19/24 0316 04/23/24 0316  NA 138 133* 134* 138  K 4.1 4.6 3.8 3.6  CL 103 102 104 107  CO2 25 22 22 25   GLUCOSE 97 142* 98 89  BUN 7* 9 9 10   CREATININE 0.56 0.73 0.72 0.61  CALCIUM  8.7* 8.2* 7.9* 8.3*   GFR: Estimated Creatinine Clearance: 43.1 mL/min (by C-G formula based on SCr of 0.61 mg/dL). Liver Function  Tests: No results for input(s): AST, ALT, ALKPHOS, BILITOT, PROT, ALBUMIN in the last 168 hours. No results for input(s): LIPASE, AMYLASE in the last 168 hours. No results for input(s): AMMONIA in the last 168 hours. Coagulation Profile: Recent Labs  Lab 04/19/24 2348 04/20/24 0337 04/21/24 0317 04/22/24 0327 04/23/24 0316  INR 1.2 1.3* 1.3* 1.3* 1.1   Cardiac Enzymes: No results for input(s): CKTOTAL, CKMB, CKMBINDEX, TROPONINI in the last 168 hours. BNP (last 3 results) No results for input(s): PROBNP in the last 8760 hours. HbA1C: No results for input(s): HGBA1C in the last 72 hours. CBG: No results for input(s): GLUCAP in the last 168 hours. Lipid Profile: No results for input(s): CHOL, HDL, LDLCALC, TRIG, CHOLHDL, LDLDIRECT in the last 72 hours. Thyroid  Function Tests: No results for input(s): TSH, T4TOTAL, FREET4, T3FREE, THYROIDAB in the last 72 hours. Anemia Panel: No results for input(s): VITAMINB12, FOLATE, FERRITIN, TIBC, IRON, RETICCTPCT in the last 72 hours. Sepsis Labs: No results for input(s): PROCALCITON, LATICACIDVEN in the last 168 hours.  Recent Results (from the past 240 hours)  Surgical pcr screen  Status: Abnormal   Collection Time: 04/16/24 11:30 PM   Specimen: Nasal Mucosa; Nasal Swab  Result Value Ref Range Status   MRSA, PCR NEGATIVE NEGATIVE Final   Staphylococcus aureus POSITIVE (A) NEGATIVE Final    Comment: (NOTE) The Xpert SA Assay (FDA approved for NASAL specimens in patients 7 years of age and older), is one component of a comprehensive surveillance program. It is not intended to diagnose infection nor to guide or monitor treatment. Performed at [facility], [address] [place], [place] [address]          Radiology Studies: No results found.       Scheduled Meds:  acetaminophen   1,000 mg Oral Q6H   amLODipine   2.5 mg Oral  Daily   atorvastatin   10 mg Oral q1800   feeding supplement  237 mL Oral BID BM   levETIRAcetam   500 mg Oral Daily   levothyroxine   88 mcg Oral Q0600   methocarbamol   500 mg Oral TID   metoprolol  tartrate  100 mg Oral Daily   And   metoprolol  tartrate  50 mg Oral QHS   pantoprazole  (PROTONIX ) IV  40 mg Intravenous Q12H   Continuous Infusions:     LOS: 14 days    Time spent: 35 minutes    Colum Colt A Bambi Fehnel, MD [facility]   If 7PM-7AM, please contact night-coverage [email or web address]  04/23/2024, 3:38 PM

## 2024-04-23 NOTE — Progress Notes (Signed)
 Progress Note  6 Days Post-Op  Subjective: Patient reports no new concerns. Has not had a bowel movement today. Reports flatulence. Tolerating soft diet. Denies nausea and vomiting.  ROS  All negative with the exception of above.  Objective: Vital signs in last 24 hours: Temp:  [97.9 F (36.6 C)-98.3 F (36.8 C)] 98.1 F (36.7 C) (08/12 0405) Pulse Rate:  [54-64] 59 (08/12 0926) Resp:  [16-17] 16 (08/12 0405) BP: (116-130)/(62-74) 116/62 (08/12 0926) SpO2:  [97 %-100 %] 98 % (08/12 0405) Last BM Date : 04/21/24  Intake/Output from previous day: 08/11 0701 - 08/12 0700 In: 520 [P.O.:520] Out: -  Intake/Output this shift: Total I/O In: 240 [P.O.:240] Out: -   PE: General: Pleasant female who is sitting in chair at bedside. Lungs: Respiratory effort nonlabored Abd: No changes; Soft, NT, ND. Midline incision C/D/I.      Lab Results:  Recent Labs    04/22/24 0327 04/23/24 0316  WBC 7.1 6.4  HGB 8.8* 9.0*  HCT 27.9* 28.2*  PLT 191 218   BMET Recent Labs    04/23/24 0316  NA 138  K 3.6  CL 107  CO2 25  GLUCOSE 89  BUN 10  CREATININE 0.61  CALCIUM  8.3*   PT/INR Recent Labs    04/22/24 0327 04/23/24 0316  LABPROT 16.8* 15.3*  INR 1.3* 1.1   CMP     Component Value Date/Time   NA 138 04/23/2024 0316   NA 144 05/08/2023 1218   K 3.6 04/23/2024 0316   CL 107 04/23/2024 0316   CO2 25 04/23/2024 0316   GLUCOSE 89 04/23/2024 0316   BUN 10 04/23/2024 0316   BUN 12 05/08/2023 1218   CREATININE 0.61 04/23/2024 0316   CREATININE 0.69 01/30/2013 1117   CALCIUM  8.3 (L) 04/23/2024 0316   PROT 5.1 (L) 04/09/2024 0320   PROT 5.8 (L) 05/08/2023 1218   ALBUMIN 2.7 (L) 04/09/2024 0320   ALBUMIN 3.8 05/08/2023 1218   AST 14 (L) 04/09/2024 0320   ALT 9 04/09/2024 0320   ALKPHOS 27 (L) 04/09/2024 0320   BILITOT 1.0 04/09/2024 0320   BILITOT 0.5 05/08/2023 1218   GFRNONAA >60 04/23/2024 0316   GFRNONAA 84 01/30/2013 1117   GFRAA 89 11/03/2020 1243    GFRAA >89 01/30/2013 1117   Lipase     Component Value Date/Time   LIPASE 15 04/08/2024 1822       Studies/Results: No results found.  Anti-infectives: Anti-infectives (From admission, onward)    Start     Dose/Rate Route Frequency Ordered Stop   04/17/24 0600  cefoTEtan  (CEFOTAN ) 2 g in sodium chloride  0.9 % 100 mL IVPB        2 g 200 mL/hr over 30 Minutes Intravenous On call to O.R. 04/16/24 0724 04/17/24 1309   04/16/24 2200  neomycin  (MYCIFRADIN ) tablet 1,000 mg       Placed in And Linked Group   1,000 mg Oral  Once 04/16/24 2047 04/16/24 2124   04/16/24 2200  metroNIDAZOLE  (FLAGYL ) tablet 1,000 mg       Placed in And Linked Group   1,000 mg Oral  Once 04/16/24 2047 04/16/24 2124   04/16/24 1400  neomycin  (MYCIFRADIN ) tablet 1,000 mg  Status:  Discontinued       Placed in And Linked Group   1,000 mg Oral 3 times per day 04/16/24 0724 04/16/24 0725   04/16/24 1400  metroNIDAZOLE  (FLAGYL ) tablet 1,000 mg  Status:  Discontinued  Placed in And Linked Group   1,000 mg Oral 3 times per day 04/16/24 0724 04/16/24 0725   04/09/24 0430  cefTRIAXone  (ROCEPHIN ) 1 g in sodium chloride  0.9 % 100 mL IVPB  Status:  Discontinued        1 g 200 mL/hr over 30 Minutes Intravenous Every 24 hours 04/09/24 0340 04/09/24 1432        Assessment/Plan POD 6, s/p laparotomy with partial colectomy and primary anastomosis, Dr. Eletha 8/6 -Patient appears well. -Vitals stable. Afebrile -WBC 6.4; Hemoglobin 9.0 - Trends stable -PT 15.3 from 17.3; INR 1.3 -Continue to hold anticoagulation for 1-2 weeks.  -Continue with soft to regular diet. -Discussed post-operative restrictions. Will arrange for RN visit to have staples removed in about 10 days. Will arrange for follow up with Dr. Eletha in 4 weeks outpatient. Stable for discharge from surgical standpoint.    FEN: Ensure, soft VTE: SCDs ID: None currently    LOS: 14 days   I reviewed nursing notes, specialist notes,  hospitalist notes, last 24 h vitals and pain scores, last 48 h intake and output, last 24 h labs and trends, and last 24 h imaging results.     Marjorie Carlyon Favre, [facility] 04/23/2024, 10:51 AM Please see Amion for pager number during day hours 7:00am-4:30pm

## 2024-04-23 NOTE — Telephone Encounter (Signed)
 Copied from CRM [id number]. Topic: Clinical - Medical Advice >> Apr 23, 2024  4:04 PM Graeme ORN wrote: Reason for CRM: Patient son called. Patient currently in hospital recovering from cancer surgery. Wants Ronal Quant or someone to call him to go over what will be needed after discharge. May need after care aor referrals to specialist or other things that only PCP can do. Call him at   Tanda Hacker - (Child) [phone]

## 2024-04-23 NOTE — Telephone Encounter (Signed)
 Please review

## 2024-04-23 NOTE — Progress Notes (Signed)
 Mobility Specialist - Progress Note   04/23/24 0842  Mobility  Activity Ambulated with assistance  Level of Assistance Standby assist, set-up cues, supervision of patient - no hands on  Assistive Device Front wheel walker  Distance Ambulated (ft) 230 ft  Range of Motion/Exercises Active  Activity Response Tolerated well  Mobility Referral Yes  Mobility visit 1 Mobility  Mobility Specialist Start Time (ACUTE ONLY) 0830  Mobility Specialist Stop Time (ACUTE ONLY) 0840  Mobility Specialist Time Calculation (min) (ACUTE ONLY) 10 min   Pt was found on recliner chair and agreeable to ambulate. No complaints with session and returned to recliner chair with all needs met. Call bell in reach.  Erminio Leos,  Mobility Specialist Can be reached via Secure Chat

## 2024-04-24 DIAGNOSIS — K6389 Other specified diseases of intestine: Secondary | ICD-10-CM | POA: Diagnosis not present

## 2024-04-24 LAB — CBC
HCT: 29.7 % — ABNORMAL LOW (ref 36.0–46.0)
Hemoglobin: 9.1 g/dL — ABNORMAL LOW (ref 12.0–15.0)
MCH: 28.6 pg (ref 26.0–34.0)
MCHC: 30.6 g/dL (ref 30.0–36.0)
MCV: 93.4 fL (ref 80.0–100.0)
Platelets: 235 K/uL (ref 150–400)
RBC: 3.18 MIL/uL — ABNORMAL LOW (ref 3.87–5.11)
RDW: 15.2 % (ref 11.5–15.5)
WBC: 6.2 K/uL (ref 4.0–10.5)
nRBC: 0 % (ref 0.0–0.2)

## 2024-04-24 LAB — PROTIME-INR
INR: 1.2 (ref 0.8–1.2)
Prothrombin Time: 15.5 s — ABNORMAL HIGH (ref 11.4–15.2)

## 2024-04-24 MED ORDER — OXYCODONE HCL 5 MG PO TABS: 5.0000 mg | ORAL_TABLET | Freq: Four times a day (QID) | ORAL | 0 refills | Status: DC | PRN

## 2024-04-24 MED ORDER — ONDANSETRON HCL 4 MG PO TABS
4.0000 mg | ORAL_TABLET | Freq: Three times a day (TID) | ORAL | 0 refills | Status: AC | PRN
Start: 2024-04-24 — End: ?

## 2024-04-24 MED ORDER — PANTOPRAZOLE SODIUM 40 MG PO TBEC: 40.0000 mg | DELAYED_RELEASE_TABLET | Freq: Every day | ORAL | 0 refills | Status: DC

## 2024-04-24 NOTE — Progress Notes (Signed)
 7 Days Post-Op   Subjective/Chief Complaint: Doing fine, had normal bm, tol diet   Objective: Vital signs in last 24 hours: Temp:  [97.4 F (36.3 C)-98.7 F (37.1 C)] 98.7 F (37.1 C) (08/13 0519) Pulse Rate:  [59-83] 63 (08/13 0519) Resp:  [16] 16 (08/13 0519) BP: (116-134)/(62-89) 134/75 (08/13 0519) SpO2:  [98 %] 98 % (08/13 0519) Last BM Date : 04/23/24  Intake/Output from previous day: 08/12 0701 - 08/13 0700 In: 1340 [P.O.:1340] Out: -  Intake/Output this shift: No intake/output data recorded.  Ab soft nontender incision without infection  Lab Results:  Recent Labs    04/23/24 0316 04/24/24 0317  WBC 6.4 6.2  HGB 9.0* 9.1*  HCT 28.2* 29.7*  PLT 218 235   BMET Recent Labs    04/23/24 0316  NA 138  K 3.6  CL 107  CO2 25  GLUCOSE 89  BUN 10  CREATININE 0.61  CALCIUM  8.3*   PT/INR Recent Labs    04/23/24 0316 04/24/24 0317  LABPROT 15.3* 15.5*  INR 1.1 1.2   ABG No results for input(s): PHART, HCO3 in the last 72 hours.  Invalid input(s): PCO2, PO2  Studies/Results: No results found.  Anti-infectives: Anti-infectives (From admission, onward)    Start     Dose/Rate Route Frequency Ordered Stop   04/17/24 0600  cefoTEtan  (CEFOTAN ) 2 g in sodium chloride  0.9 % 100 mL IVPB        2 g 200 mL/hr over 30 Minutes Intravenous On call to O.R. 04/16/24 0724 04/17/24 1309   04/16/24 2200  neomycin  (MYCIFRADIN ) tablet 1,000 mg       Placed in And Linked Group   1,000 mg Oral  Once 04/16/24 2047 04/16/24 2124   04/16/24 2200  metroNIDAZOLE  (FLAGYL ) tablet 1,000 mg       Placed in And Linked Group   1,000 mg Oral  Once 04/16/24 2047 04/16/24 2124   04/16/24 1400  neomycin  (MYCIFRADIN ) tablet 1,000 mg  Status:  Discontinued       Placed in And Linked Group   1,000 mg Oral 3 times per day 04/16/24 0724 04/16/24 0725   04/16/24 1400  metroNIDAZOLE  (FLAGYL ) tablet 1,000 mg  Status:  Discontinued       Placed in And Linked Group    1,000 mg Oral 3 times per day 04/16/24 0724 04/16/24 0725   04/09/24 0430  cefTRIAXone  (ROCEPHIN ) 1 g in sodium chloride  0.9 % 100 mL IVPB  Status:  Discontinued        1 g 200 mL/hr over 30 Minutes Intravenous Every 24 hours 04/09/24 0340 04/09/24 1432       Assessment/Plan: POD 7, s/p laparotomy with partial colectomy and primary anastomosis, Dr. Eletha 8/6 -hb stable -Continue to hold anticoagulation for 1-2 weeks.  -Continue with soft to regular diet. -Discussed post-operative restrictions. Will arrange for RN visit to have staples removed in about 10 days. Will arrange for follow up with Dr. Eletha in 4 weeks outpatient. Stable for discharge from surgical standpoint.    FEN: Ensure, soft VTE: SCDs ID: None currently    Melissa Knight 04/24/2024

## 2024-04-24 NOTE — Progress Notes (Signed)
 Occupational Therapy Treatment Patient Details Name: Melissa Knight MRN: [id number] DOB: 01-01-1933 Today's Date: 04/24/2024   History of present illness 89 yo female admitted 04/08/24   complaining of abdominal pain, nausea vomiting and diarrhea. CT abdomen and pelvis showed features concerning for colonic mass in the transverse colon concerning for colon cancer.  S/P laparotomy with partial colectomy and primary anastomosis, Dr. Eletha 8/6. past medical history significant for permanent A-fib, hypertension, hypothyroidism, embolic stroke   OT comments  Pt making good progress wit functional goals. Pt in chair upon OT arrival and ambulated with supervision  with RW to bathroom and completed toileting, dressing, stood at sink for grooming/hygiene. Pt eager to d/c home this afternoon      If plan is discharge home, recommend the following:  A little help with walking and/or transfers;A little help with bathing/dressing/bathroom;Assistance with cooking/housework;Assist for transportation;Help with stairs or ramp for entrance   Equipment Recommendations  BSC/3in1;Tub/shower seat    Recommendations for Other Services      Precautions / Restrictions Precautions Precautions: Fall Recall of Precautions/Restrictions: Intact Precaution/Restrictions Comments: abd surgery Restrictions Weight Bearing Restrictions Per Provider Order: No       Mobility Bed Mobility               General bed mobility comments: pt seated in recliner    Transfers Overall transfer level: Needs assistance Equipment used: Rolling walker (2 wheels), None Transfers: Sit to/from Stand Sit to Stand: Supervision     Step pivot transfers: Supervision     General transfer comment: ambulated with supervision to bathroom and completed toileting, dressing, stood at sink for grooming/hygiene     Balance Overall balance assessment: No apparent balance deficits (not formally assessed)                                          ADL either performed or assessed with clinical judgement   ADL Overall ADL's : Needs assistance/impaired     Grooming: Wash/dry hands;Wash/dry face;Standing;Modified independent           Upper Body Dressing : Modified independent;Standing   Lower Body Dressing: Supervision/safety;Sit to/from stand   Toilet Transfer: Supervision/safety;Rolling walker (2 wheels);Ambulation   Toileting- Clothing Manipulation and Hygiene: Modified independent   Tub/ Shower Transfer: Supervision/safety;Ambulation;Rolling walker (2 wheels);Grab bars   Functional mobility during ADLs: Supervision/safety;Rolling walker (2 wheels) General ADL Comments: ambulated with supervision to bathroom and completed toileting, dressing, stood at sink for grooming/hygiene    Extremity/Trunk Assessment Upper Extremity Assessment Upper Extremity Assessment: Overall WFL for tasks assessed   Lower Extremity Assessment Lower Extremity Assessment: Defer to PT evaluation        Vision Ability to See in Adequate Light: 0 Adequate Patient Visual Report: No change from baseline     Perception     Praxis     Communication Communication Communication: No apparent difficulties   Cognition Arousal: Alert Behavior During Therapy: WFL for tasks assessed/performed Cognition: No apparent impairments                               Following commands: Intact        Cueing   Cueing Techniques: Verbal cues  Exercises      Shoulder Instructions       General Comments      Pertinent Vitals/ Pain  Pain Assessment Pain Assessment: Faces Faces Pain Scale: Hurts a little bit Pain Location: ABD Pain Descriptors / Indicators: Discomfort, Sore Pain Intervention(s): Monitored during session, Repositioned  Home Living                                          Prior Functioning/Environment              Frequency  Min 2X/week         Progress Toward Goals  OT Goals(current goals can now be found in the care plan section)  Progress towards OT goals: Progressing toward goals     Plan      Co-evaluation                 AM-PAC OT 6 Clicks Daily Activity     Outcome Measure   Help from another person eating meals?: None Help from another person taking care of personal grooming?: None Help from another person toileting, which includes using toliet, bedpan, or urinal?: A Little Help from another person bathing (including washing, rinsing, drying)?: A Little Help from another person to put on and taking off regular upper body clothing?: None Help from another person to put on and taking off regular lower body clothing?: A Little 6 Click Score: 21    End of Session Equipment Utilized During Treatment: Gait belt;Rolling walker (2 wheels)  OT Visit Diagnosis: Unsteadiness on feet (R26.81)   Activity Tolerance Patient tolerated treatment well   Patient Left in chair;with call bell/phone within reach   Nurse Communication Mobility status        Time: 1001-1029 OT Time Calculation (min): 28 min  Charges: OT General Charges $OT Visit: 1 Visit OT Treatments $Self Care/Home Management : 8-22 mins $Therapeutic Activity: 8-22 mins    Jacques Karna Loose 04/24/2024, 12:24 PM

## 2024-04-24 NOTE — Discharge Summary (Signed)
 Physician Discharge Summary   Patient: Melissa Knight MRN: [id number] DOB: 05-23-1933  Admit date:     04/08/2024  Discharge date: 04/24/24  Discharge Physician: Deliliah Room   PCP: [name], FNP   Recommendations at discharge:    Follow up with your PCP in 1 week Follow-up outpatient with general surgery for staple removal on the scheduled appointment Follow-up outpatient with general surgeon, Dr. Eletha on the scheduled appointment Do not resume taking Eliquis  until you see your family doctor or general surgeon.  Return to the emergency room if you develop any blood in the stools, chest pain, worsening abdominal pain, fever or shortness of breath. 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  - Underwent colonoscopy 8/2 by [name], which showed partially obstructing mass with pinpoint opening in the distal transverse proximal descending colon.  Pathology transverse colon mass hyperplastic polyp negative for dysplasia, sigmoid colon polyp traditional serrated adenoma hyperplastic polyp. - General Surgery consulted, patient underwent transfer colectomy on 8/6. - Patient was evaluated by cardiology and felt to be high risk, patient understand risk and agreed to proceed with surgery. -CT chest was negative for metastatic disease. -Surgical pathology: adenocarcinoma.  -On Alvimopan  -Diet advanced to Soft diet.  -Had some bloody stool and or hematuria 8/08. The morning of 8/09 has had 2  coffee color BM, watery. -Discussed with cardiology and general Sx plan to hold Anticoagulation. Discussed with Sx 8/11 plan to hold anticoagulation for 1-2 weeks .  -Continue with PO PPI Continue to hold anticoagulation and monitor hb. -Appointment scheduled with oncology as an outpatient.    History of paroxysmal A-fib: Initially with supratherapeutic INR. Continue metoprolol  Heparin  was  resume 8/6 per surgery recommendations.  Had some bloody/dark stools Continue to hold anticoagulation. Discussed with Sx hold it for one week.      Acute normocytic anemia: In the setting of malignancy Monitor hb     Essential hypertension: Continue amlodipine  and metoprolol  Continue to hold lisinopril  for now   History of stroke: Continue statin, hold Coumadin     Hypokalemia Repleted   Acquired hypothyroidism: Continue with Synthroid  Seizure disorder: Continue with Keppra    UTI was rule out  Disposition: Home      Consultants: Surgery, Palliative, Procedures performed: s/p laparotomy with partial colectomy and primary anastomosis, Dr. Eletha 8/6   Disposition: Home Diet recommendation:  Soft, thin liquids DISCHARGE MEDICATION: Allergies as of 04/24/2024       Reactions   Sulfa Antibiotics Rash   rash        Medication List     STOP taking these medications    lisinopril  20 MG tablet Commonly known as: ZESTRIL    warfarin 7.5 MG tablet Commonly known as: COUMADIN        TAKE these medications    amLODipine  2.5 MG tablet Commonly known as: NORVASC  Take 1 tablet (2.5 mg total) by mouth daily.   atorvastatin  10 MG tablet Commonly known as: LIPITOR Take 1 tablet (10 mg total) by mouth daily at 6 PM.   betamethasone  dipropionate 0.05 %  cream Apply daily as needed to affected area   calcium -vitamin D 500-200 MG-UNIT tablet Commonly known as: OSCAL WITH D Take 1 tablet by mouth.   folic acid  1 MG tablet Commonly known as: FOLVITE  TAKE 1 TABLET BY MOUTH EVERY  DAY   furosemide  20 MG tablet Commonly known as: LASIX  TAKE 1 TABLET EVERY DAY AS NEEDED   levETIRAcetam  500 MG tablet Commonly known as: KEPPRA  Take 1 tablet (500 mg total) by mouth daily.   levothyroxine  88 MCG tablet Commonly known as: SYNTHROID  Take 1 tablet (88 mcg total) by mouth daily.   metoprolol  tartrate 50 MG tablet Commonly known as: LOPRESSOR  TAKE 2 TABLETS BY MOUTH IN MORNING AND 1 TABLET BY MOUTH EVERY NIGHT AT BEDTIME   ondansetron  4 MG tablet Commonly known as: ZOFRAN  Take 1 tablet (4 mg total) by mouth every 8 (eight) hours as needed for nausea or vomiting.   oxyCODONE  5 MG immediate release tablet Commonly known as: Oxy IR/ROXICODONE  Take 1 tablet (5 mg total) by mouth every 6 (six) hours as needed for severe pain (pain score 7-10).   TURMERIC PO Take 1 Dose by mouth daily.               Durable Medical Equipment  (From admission, onward)           Start     Ordered   04/22/24 1129  For home use only DME Walker rolling  Once       Question Answer Comment  Walker: With 5 Inch Wheels   Patient needs a walker to treat with the following condition Difficulty in walking, not elsewhere classified      04/22/24 1129            Follow-up Information     [facility] Follow up on 04/29/2024.   Specialty: General Surgery Why: This is an RN only appointment for staple removal, 10:30am, Arrive 30 minutes prior to your appointment time, Please bring your insurance card and photo ID Contact information: [address] [place] [address] [phone]         Eletha Boas, MD Follow up on 05/09/2024.   Specialty: General Surgery Why: 2:45pm, Arrive 15 minutes prior to your appointment time, Please bring your insurance card and photo ID Contact information: [address] [place] [address] [phone]         Cloretta Arley NOVAK, MD Follow up on 05/09/2024.   Specialty: Oncology Contact  information: [address] [place] [address] [phone]         [name], FNP. Schedule an appointment as soon as possible for a visit in 1 week(s).   Specialty: Family Medicine Contact information: [address] [place] [address] [phone]                Discharge Exam: Fredricka Weights   04/08/24 1818 04/13/24 1032 04/17/24 0736  Weight: 65.8 kg 65.8 kg 68 kg   Constitutional: NAD, calm, comfortable Eyes: PERRL, lids and conjunctivae normal ENMT: Mucous membranes are moist. Posterior pharynx clear of any exudate or lesions.Normal dentition.  Neck: normal, supple, no masses, no thyromegaly Respiratory: clear to auscultation bilaterally, no wheezing, no crackles. Normal respiratory effort. No accessory muscle use.  Cardiovascular: Regular rate and rhythm, no murmurs / rubs / gallops. No extremity edema. 2+ pedal pulses. No carotid bruits.  Abdomen: soft non tender incision, staples in place Musculoskeletal: no clubbing / cyanosis. No joint deformity upper  and lower extremities. Good ROM, no contractures. Normal muscle tone.  Skin: no rashes, lesions, ulcers. No induration Neurologic: CN 2-12 grossly intact. Sensation intact, DTR normal. Strength 5/5 x all 4 extremities.  Psychiatric: Normal judgment and insight. Alert and oriented x 3. Normal mood.    Condition at discharge: good  The results of significant diagnostics from this hospitalization (including imaging, microbiology, ancillary and laboratory) are listed below for reference.   Imaging Studies: ECHOCARDIOGRAM COMPLETE Result Date: 04/15/2024    ECHOCARDIOGRAM REPORT   Patient Name:   Emilly Stickels Date of Exam: 04/15/2024 Medical Rec #:  [id number]    Height:       62.0 in Accession #:    [id number]   Weight:       145.1 lb Date of Birth:  [birth year]     BSA:          1.668 m Patient Age:    89 years     BP:           135/67 mmHg Patient Gender: F            HR:           97 bpm.  Exam Location:  Inpatient Procedure: 2D Echo, Color Doppler and Cardiac Doppler (Both Spectral and Color            Flow Doppler were utilized during procedure). Indications:    Atrial fibrillation  History:        Patient has no prior history of Echocardiogram examinations.                 Risk Factors:Hypertension and Dyslipidemia.  Sonographer:    Philomena Daring Referring Phys: [id number] HAO MENG IMPRESSIONS  1. Left ventricular ejection fraction, by estimation, is 60 to 65%. The left ventricle has normal function. The left ventricle has no regional wall motion abnormalities. Left ventricular diastolic function could not be evaluated.  2. Right ventricular systolic function is normal. The right ventricular size is normal.  3. The mitral valve is normal in structure. Mild mitral valve regurgitation. No evidence of mitral stenosis.  4. The aortic valve is tricuspid. There is mild calcification of the aortic valve. Aortic valve regurgitation is not visualized. Aortic valve sclerosis/calcification is present, without any evidence of aortic stenosis.  5. The inferior vena cava is normal in size with greater than 50% respiratory variability, suggesting right atrial pressure of 3 mmHg. FINDINGS  Left Ventricle: Left ventricular ejection fraction, by estimation, is 60 to 65%. The left ventricle has normal function. The left ventricle has no regional wall motion abnormalities. The left ventricular internal cavity size was normal in size. There is  no left ventricular hypertrophy. Left ventricular diastolic function could not be evaluated due to atrial fibrillation. Left ventricular diastolic function could not be evaluated. Right Ventricle: The right ventricular size is normal. No increase in right ventricular wall thickness. Right ventricular systolic function is normal. Left Atrium: Left atrial size was normal in size. Right Atrium: Right atrial size was normal in size. Pericardium: There is no evidence of pericardial  effusion. Mitral Valve: The mitral valve is normal in structure. Mild mitral valve regurgitation. No evidence of mitral valve stenosis. Tricuspid Valve: The tricuspid valve is normal in structure. Tricuspid valve regurgitation is mild . No evidence of tricuspid stenosis. Aortic Valve: The aortic valve is tricuspid. There is mild calcification of the aortic valve. Aortic valve regurgitation is not visualized. Aortic valve sclerosis/calcification is present, without any evidence of  aortic stenosis. Pulmonic Valve: The pulmonic valve was normal in structure. Pulmonic valve regurgitation is not visualized. No evidence of pulmonic stenosis. Aorta: The aortic root is normal in size and structure. Venous: The inferior vena cava is normal in size with greater than 50% respiratory variability, suggesting right atrial pressure of 3 mmHg. IAS/Shunts: No atrial level shunt detected by color flow Doppler.  LEFT VENTRICLE PLAX 2D LVIDd:         3.50 cm     Diastology LVIDs:         2.80 cm     LV e' medial:    6.74 cm/s LV PW:         1.30 cm     LV E/e' medial:  12.5 LV IVS:        1.20 cm     LV e' lateral:   10.30 cm/s LVOT diam:     1.60 cm     LV E/e' lateral: 8.2 LV SV:         27 LV SV Index:   16 LVOT Area:     2.01 cm  LV Volumes (MOD) LV vol d, MOD A2C: 41.3 ml LV vol d, MOD A4C: 58.9 ml LV vol s, MOD A2C: 13.8 ml LV vol s, MOD A4C: 18.5 ml LV SV MOD A2C:     27.5 ml LV SV MOD A4C:     58.9 ml LV SV MOD BP:      33.8 ml RIGHT VENTRICLE             IVC RV S prime:     11.40 cm/s  IVC diam: 1.20 cm TAPSE (M-mode): 1.4 cm LEFT ATRIUM             Index        RIGHT ATRIUM           Index LA Vol (A2C):   38.7 ml 23.20 ml/m  RA Area:     16.10 cm LA Vol (A4C):   49.0 ml 29.38 ml/m  RA Volume:   36.00 ml  21.59 ml/m LA Biplane Vol: 45.0 ml 26.98 ml/m  AORTIC VALVE LVOT Vmax:   77.10 cm/s LVOT Vmean:  49.200 cm/s LVOT VTI:    0.132 m  AORTA Ao Root diam: 3.00 cm Ao Asc diam:  3.80 cm MITRAL VALVE               TRICUSPID  VALVE MV Area (PHT): 3.49 cm    TR Peak grad:   24.8 mmHg MV E velocity: 84.40 cm/s  TR Vmax:        249.00 cm/s                             SHUNTS                            Systemic VTI:  0.13 m                            Systemic Diam: 1.60 cm Morene Brownie Electronically signed by Morene Brownie Signature Date/Time: 04/15/2024/5:49:34 PM    Final    CT CHEST W CONTRAST Result Date: 04/15/2024 CLINICAL DATA:  Colon cancer, assess treatment response. * Tracking Code: BO * EXAM: CT CHEST WITH CONTRAST TECHNIQUE: Multidetector CT imaging of the chest was performed during intravenous contrast administration.  RADIATION DOSE REDUCTION: This exam was performed according to the departmental dose-optimization program which includes automated exposure control, adjustment of the mA and/or kV according to patient size and/or use of iterative reconstruction technique. CONTRAST:  75mL OMNIPAQUE  IOHEXOL  300 MG/ML  SOLN COMPARISON:  None Available. FINDINGS: Cardiovascular: Normal cardiac size. No pericardial effusion. No aortic aneurysm. There are coronary artery calcifications, in keeping with coronary artery disease. There are also moderate peripheral atherosclerotic vascular calcifications of thoracic aorta and its major branches. Mediastinum/Nodes: Visualized thyroid  gland appears grossly unremarkable. No solid / cystic mediastinal masses. The esophagus is nondistended precluding optimal assessment. There are multiple partially calcified mediastinal and right hilar lymph nodes, which are nonspecific but commonly seen as a sequela of prior granulomatous infection. No axillary or left hilar lymphadenopathy by size criteria. Lungs/Pleura: The central tracheo-bronchial tree is patent. There multiple calcified granulomas throughout bilateral lungs with largest in the right upper lobe measuring up to 6 mm. No suspicious lung nodule. There is trace left pleural effusion, which is new since the prior study from 04/08/2024. no  right pleural effusion. No mass, consolidation or pneumothorax. Upper Abdomen: Multiple calcified granulomas noted in the spleen and liver. There is an additional subcentimeter hypoattenuating structure in the right hepatic lobe, which is too small to adequately characterize. Small diverticulum noted arising from the second part of duodenum. Remaining visualized upper abdominal viscera within normal limits. Musculoskeletal: The visualized soft tissues of the chest wall are grossly unremarkable. No suspicious osseous lesions. There are mild to moderate multilevel degenerative changes in the visualized spine. IMPRESSION: 1. No metastatic disease identified within the chest. 2. Multiple other nonacute observations, as described above. Aortic Atherosclerosis (ICD10-I70.0). Electronically Signed   By: Ree Molt M.D.   On: 04/15/2024 12:00   CT ABDOMEN PELVIS W CONTRAST Result Date: 04/08/2024 CLINICAL DATA:  Abdominal pain, diarrhea, vomiting, bilateral lower quadrant pain. Symptoms began on Friday. EXAM: CT ABDOMEN AND PELVIS WITH CONTRAST TECHNIQUE: Multidetector CT imaging of the abdomen and pelvis was performed using the standard protocol following bolus administration of intravenous contrast. RADIATION DOSE REDUCTION: This exam was performed according to the departmental dose-optimization program which includes automated exposure control, adjustment of the mA and/or kV according to patient size and/or use of iterative reconstruction technique. CONTRAST:  OMNIPAQUE  IOHEXOL  300 MG/ML  SOLN COMPARISON:  None Available. FINDINGS: Lower chest: Motion artifact limits examination. Tiny low-density pulmonary nodules are demonstrated. Perhaps the most prominent is in the right lower lung, series 5, image 14, measuring 4 mm in diameter. Linear scarring in the right base. Hepatobiliary: Scattered calcifications in the liver, likely granulomas. Gallbladder and bile ducts are normal. Pancreas: Unremarkable. No  pancreatic ductal dilatation or surrounding inflammatory changes. Spleen: Multiple calcified granulomas in the spleen. Adrenals/Urinary Tract: No adrenal gland nodules. Lower pole left renal cyst measuring 4.4 cm diameter. No imaging follow-up is indicated. Nephrograms are symmetrical. No hydronephrosis or hydroureter. Bladder is normal for degree of distention. Stomach/Bowel: Stomach and small bowel are mostly decompressed. Small duodenal diverticulum. Liquid stool demonstrated throughout the colon. The right hemicolon is moderately distended. There is an annular constricting mass in the mid transverse colon likely representing colon cancer. The appendix is normal. Vascular/Lymphatic: Aortic atherosclerosis. No enlarged abdominal or pelvic lymph nodes. Reproductive: Uterus and bilateral adnexa are unremarkable. Other: Small amount of free fluid in the upper abdomen and pelvis is probably reactive. No free air. Abdominal wall musculature appears intact. Musculoskeletal: Degenerative changes in the spine. No acute bony abnormalities. No destructive  bone lesions. IMPRESSION: 1. There is an annular constricting lesion in the mid transverse colon likely representing colon cancer. There is mild proximal colonic dilatation. 2. Liquid stool in the colon likely representing infectious colitis. 3. Appendix is normal. 4. Aortic atherosclerosis. 5. Multiple pulmonary nodules, largest measuring 4 mm. In the setting of probable neoplasm, metastatic disease is not excluded although still unlikely. Oncological follow-up is recommended. Electronically Signed   By: Elsie Gravely M.D.   On: 04/08/2024 20:49    Microbiology: Results for orders placed or performed during the hospital encounter of 04/08/24  C Difficile Quick Screen w PCR reflex     Status: None   Collection Time: 04/09/24  7:44 AM   Specimen: STOOL  Result Value Ref Range Status   C Diff antigen NEGATIVE NEGATIVE Final   C Diff toxin NEGATIVE NEGATIVE Final    C Diff interpretation No C. difficile detected.  Final    Comment: Performed at [facility], [address] [place], [place] [address]  Gastrointestinal Panel by PCR , Stool     Status: None   Collection Time: 04/09/24  7:44 AM   Specimen: Stool  Result Value Ref Range Status   Campylobacter species NOT DETECTED NOT DETECTED Final   Plesimonas shigelloides NOT DETECTED NOT DETECTED Final   Salmonella species NOT DETECTED NOT DETECTED Final   Yersinia enterocolitica NOT DETECTED NOT DETECTED Final   Vibrio species NOT DETECTED NOT DETECTED Final   Vibrio cholerae NOT DETECTED NOT DETECTED Final   Enteroaggregative E coli (EAEC) NOT DETECTED NOT DETECTED Final   Enteropathogenic E coli (EPEC) NOT DETECTED NOT DETECTED Final   Enterotoxigenic E coli (ETEC) NOT DETECTED NOT DETECTED Final   Shiga like toxin producing E coli (STEC) NOT DETECTED NOT DETECTED Final   Shigella/Enteroinvasive E coli (EIEC) NOT DETECTED NOT DETECTED Final   Cryptosporidium NOT DETECTED NOT DETECTED Final   Cyclospora cayetanensis NOT DETECTED NOT DETECTED Final   Entamoeba histolytica NOT DETECTED NOT DETECTED Final   Giardia lamblia NOT DETECTED NOT DETECTED Final   Adenovirus F40/41 NOT DETECTED NOT DETECTED Final   Astrovirus NOT DETECTED NOT DETECTED Final   Norovirus GI/GII NOT DETECTED NOT DETECTED Final   Rotavirus A NOT DETECTED NOT DETECTED Final   Sapovirus (I, II, IV, and V) NOT DETECTED NOT DETECTED Final    Comment: Performed at [facility], [address] [place], [place] [address]  Surgical pcr screen     Status: Abnormal   Collection Time: 04/16/24 11:30 PM   Specimen: Nasal Mucosa; Nasal Swab  Result Value Ref Range Status   MRSA, PCR NEGATIVE NEGATIVE Final   Staphylococcus aureus POSITIVE (A) NEGATIVE Final    Comment: (NOTE) The Xpert SA Assay (FDA approved for NASAL specimens in patients 64 years of age and older), is one component of a  comprehensive surveillance program. It is not intended to diagnose infection nor to guide or monitor treatment. Performed at [facility], [address], [place], [place] [address]     Labs: CBC: Recent Labs  Lab 04/20/24 1553 04/20/24 2257 04/21/24 0317 04/22/24 0327 04/23/24 0316 04/24/24 0317  WBC 10.0  --  5.5 7.1 6.4 6.2  HGB 9.9* 8.7* 8.5* 8.8* 9.0* 9.1*  HCT 32.1* 28.0* 27.8* 27.9* 28.2* 29.7*  MCV 93.6  --  93.6 92.7 91.9 93.4  PLT 227  --  181 191 218 235   Basic Metabolic Panel: Recent Labs  Lab 04/18/24 0323 04/19/24 0316  04/23/24 0316  NA 133* 134* 138  K 4.6 3.8 3.6  CL 102 104 107  CO2 22 22 25   GLUCOSE 142* 98 89  BUN 9 9 10   CREATININE 0.73 0.72 0.61  CALCIUM  8.2* 7.9* 8.3*   Liver Function Tests: No results for input(s): AST, ALT, ALKPHOS, BILITOT, PROT, ALBUMIN in the last 168 hours. CBG: No results for input(s): GLUCAP in the last 168 hours.  Discharge time spent: 42 minutes.  Signed: Deliliah Room, MD [facility] 04/24/2024

## 2024-04-24 NOTE — Plan of Care (Signed)
  Problem: Clinical Measurements: Goal: Will remain free from infection Outcome: Progressing   Problem: Activity: Goal: Risk for activity intolerance will decrease Outcome: Progressing   Problem: Nutrition: Goal: Adequate nutrition will be maintained Outcome: Progressing   Problem: Coping: Goal: Level of anxiety will decrease Outcome: Progressing   Problem: Elimination: Goal: Will not experience complications related to urinary retention Outcome: Progressing   Problem: Safety: Goal: Ability to remain free from injury will improve Outcome: Progressing   Problem: Skin Integrity: Goal: Risk for impaired skin integrity will decrease Outcome: Progressing   Problem: Respiratory: Goal: Respiratory status will improve Outcome: Progressing   Problem: Skin Integrity: Goal: Will show signs of wound healing Outcome: Progressing

## 2024-04-25 ENCOUNTER — Encounter: Payer: Self-pay | Admitting: Nurse Practitioner

## 2024-04-25 ENCOUNTER — Ambulatory Visit (INDEPENDENT_AMBULATORY_CARE_PROVIDER_SITE_OTHER): Admitting: Nurse Practitioner

## 2024-04-25 ENCOUNTER — Ambulatory Visit: Payer: Self-pay | Admitting: Nurse Practitioner

## 2024-04-25 ENCOUNTER — Telehealth: Payer: Self-pay

## 2024-04-25 VITALS — BP 123/62 | HR 82 | Temp 97.1°F | Ht 62.0 in | Wt 143.0 lb

## 2024-04-25 DIAGNOSIS — I4821 Permanent atrial fibrillation: Secondary | ICD-10-CM | POA: Diagnosis not present

## 2024-04-25 DIAGNOSIS — Z9049 Acquired absence of other specified parts of digestive tract: Secondary | ICD-10-CM | POA: Diagnosis not present

## 2024-04-25 LAB — COAGUCHEK XS/INR WAIVED
INR: 1 (ref 0.9–1.1)
Prothrombin Time: 12.2 s

## 2024-04-25 NOTE — Telephone Encounter (Signed)
 Patient seen in office today and spoke and advised family while here

## 2024-04-25 NOTE — Transitions of Care (Post Inpatient/ED Visit) (Signed)
   04/25/2024  Name: Melissa Knight MRN: [id number] DOB: [birth year]  Today's TOC FU Call Status: Today's TOC FU Call Status:: Unsuccessful Call (1st Attempt) Unsuccessful Call (1st Attempt) Date: 04/25/24  Attempted to reach the patient regarding the most recent Inpatient/ED visit.  Follow Up Plan: Additional outreach attempts will be made to reach the patient to complete the Transitions of Care (Post Inpatient/ED visit) call.   Signature Julian Lemmings, LPN [facility] Nurse Health Advisor Direct Dial [phone]

## 2024-04-25 NOTE — Progress Notes (Signed)
 Subjective:    Patient ID: Melissa Knight, female    DOB: [birth year], 89 y.o.   MRN: [id number]   Chief Complaint: INR recheck  HPI  Patient recently had abdominal surgery for colon cancer. Her coumadin  was held during that time. She is not on coumadin  currently. Is to restart in a week. Was discharged yesterday. Denies any pain. Bowels are moving normal.  * her daughter in law is with her today. Says that she needs help at home and he does nit know what to do to get help.  Patient Active Problem List   Diagnosis Date Noted   Palliative care encounter 04/14/2024   Counseling and coordination of care 04/14/2024   Goals of care, counseling/discussion 04/14/2024   Malnutrition of moderate degree 04/11/2024   Coagulopathy (HCC) 04/09/2024   Hypokalemia 04/09/2024   History of embolic stroke 04/09/2024   Colonic mass 04/08/2024   Morbid obesity (HCC) 10/05/2016   Lichen sclerosus of female genitalia 10/10/2014   Stress incontinence 10/10/2014   Hypothyroidism (acquired) 06/05/2014   Metabolic syndrome 01/01/2014   Hyperlipidemia 02/05/2013   Permanent atrial fibrillation (HCC) 07/02/2012   Late effect of cerebrovascular accident 06/30/2012   Seizure disorder, generalized convulsive, intractable (HCC) 06/30/2012   Essential hypertension 06/30/2012       Review of Systems  Constitutional:  Negative for diaphoresis.  Eyes:  Negative for pain.  Respiratory:  Negative for shortness of breath.   Cardiovascular:  Negative for chest pain, palpitations and leg swelling.  Gastrointestinal:  Negative for abdominal pain.  Endocrine: Negative for polydipsia.  Skin:  Negative for rash.  Neurological:  Negative for dizziness, weakness and headaches.  Hematological:  Does not bruise/bleed easily.  All other systems reviewed and are negative.      Objective:   Physical Exam Vitals and nursing note reviewed.  Constitutional:      General: She is not in acute distress.    Appearance:  Normal appearance. She is well-developed.  Neck:     Vascular: No carotid bruit or JVD.  Cardiovascular:     Rate and Rhythm: Normal rate and regular rhythm.     Heart sounds: Normal heart sounds.  Pulmonary:     Effort: Pulmonary effort is normal. No respiratory distress.     Breath sounds: Normal breath sounds. No wheezing or rales.  Chest:     Chest wall: No tenderness.  Abdominal:     General: Bowel sounds are normal. There is no distension or abdominal bruit.     Palpations: Abdomen is soft. There is no hepatomegaly, splenomegaly, mass or pulsatile mass.     Tenderness: There is no abdominal tenderness.  Musculoskeletal:        General: Normal range of motion.     Cervical back: Normal range of motion and neck supple.  Lymphadenopathy:     Cervical: No cervical adenopathy.  Skin:    General: Skin is warm and dry.     Comments: Abdominal incision looks great  Neurological:     Mental Status: She is alert and oriented to person, place, and time.     Deep Tendon Reflexes: Reflexes are normal and symmetric.  Psychiatric:        Behavior: Behavior normal.        Thought Content: Thought content normal.        Judgment: Judgment normal.    BP 123/62   Pulse 82   Temp (!) 97.1 F (36.2 C) (Temporal)   Ht 5' 2 (1.575  m)   Wt 143 lb (64.9 kg)   SpO2 98%   BMI 26.16 kg/m          Assessment & Plan:  Melissa Knight in today with chief complaint of No chief complaint on file.   1. Permanent atrial fibrillation (HCC) (Primary) Back on coumadin  in 1 week - CoaguChek XS/INR Waived  2. S/P colon resection Keep follow up with surgeon Will follow up in 1 week to have staples removed Hospital records reviewed   The above assessment and management plan was discussed with the patient. The patient verbalized understanding of and has agreed to the management plan. Patient is aware to call the clinic if symptoms persist or worsen. Patient is aware when to return to the clinic  for a follow-up visit. Patient educated on when it is appropriate to go to the emergency department.   Mary-Margaret Gladis, FNP

## 2024-04-25 NOTE — Patient Instructions (Signed)

## 2024-04-26 NOTE — Transitions of Care (Post Inpatient/ED Visit) (Signed)
   04/26/2024  Name: Melissa Knight MRN: [id number] DOB: [birth year]  Today's TOC FU Call Status: Today's TOC FU Call Status:: Unsuccessful Call (1st Attempt) Unsuccessful Call (1st Attempt) Date: 04/25/24  Attempted to reach the patient regarding the most recent Inpatient/ED visit.  Follow Up Plan: No further outreach attempts will be made at this time. We have been unable to contact the patient. Patient already seen in office Signature Julian Lemmings, LPN [facility] Nurse Health Advisor Direct Dial [phone]

## 2024-05-03 ENCOUNTER — Encounter (HOSPITAL_COMMUNITY): Payer: Self-pay | Admitting: Oncology

## 2024-05-06 ENCOUNTER — Ambulatory Visit (INDEPENDENT_AMBULATORY_CARE_PROVIDER_SITE_OTHER): Admitting: Nurse Practitioner

## 2024-05-06 ENCOUNTER — Encounter: Payer: Self-pay | Admitting: Nurse Practitioner

## 2024-05-06 VITALS — BP 124/72 | HR 96 | Temp 97.9°F | Ht 62.0 in | Wt 143.0 lb

## 2024-05-06 DIAGNOSIS — I4821 Permanent atrial fibrillation: Secondary | ICD-10-CM | POA: Diagnosis not present

## 2024-05-06 DIAGNOSIS — Z4802 Encounter for removal of sutures: Secondary | ICD-10-CM | POA: Diagnosis not present

## 2024-05-06 MED ORDER — APIXABAN 5 MG PO TABS: 5.0000 mg | ORAL_TABLET | Freq: Two times a day (BID) | ORAL | 2 refills | Status: DC

## 2024-05-06 NOTE — Patient Instructions (Signed)
 Apixaban  Tablets What is this medication? APIXABAN  (a PIX a ban) prevents and treats blood clots. It is also used to lower the risk of stroke in people with AFib (atrial fibrillation). It belongs to a group of medications called blood thinners. This medicine may be used for other purposes; ask your health care provider or pharmacist if you have questions. COMMON BRAND NAME(S): Eliquis  What should I tell my care team before I take this medication? They need to know if you have any of these conditions: Antiphospholipid syndrome (APS) Bleeding problems Having surgery, an epidural, a spinal tap, or any other procedure that involves the area around your spine Kidney disease Liver disease Prosthetic heart valve An unusual or allergic reaction to apixaban , other medications, foods, dyes, or preservatives Pregnant or trying to get pregnant Breastfeeding How should I use this medication? Take this medication by mouth. Take it as directed on the prescription label at the same time every day. You can take it with or without food. If it upsets your stomach, take it with food. For your therapy to work as well as possible, take each dose exactly as prescribed on the prescription label. Do not skip doses. Skipping doses or stopping this medication can increase your risk of a blood clot or stroke. Keep taking this medication unless your care team tells you to stop. A special MedGuide will be given to you by the pharmacist with each prescription and refill. Be sure to read this information carefully each time. Talk to your care team about the use of this medication in children. While it may be prescribed for children as young as newborns for selected conditions, precautions do apply. Overdosage: If you think you have taken too much of this medicine contact a poison control center or emergency room at once. NOTE: This medicine is only for you. Do not share this medicine with others. What if I miss a dose? If  you miss a dose, take it as soon as you can. If it is almost time for your next dose, take only that dose. Do not take double or extra doses. If a child vomits or spits up 30 minutes or less after taking their dose, repeat that dose. If the child vomits more than 30 minutes after taking their dose, do not give another dose. Give their next dose at their normal scheduled time. Talk to your care team if the child continues to vomit or spit up after taking their dose. What may interact with this medication? Do not take this medication with any of the following: Defibrotide Factor X Mifepristone Prothrombin complex concentrate This medication may also interact with the following: Aspirin  and aspirin -like medications NSAIDs, medications for pain and inflammation, such as ibuprofen or naproxen Other medications that treat and prevent blood clots, such as clopidogrel, enoxaparin, dalteparin, heparin , warfarin Rifampin Ritonavir SNRIs, medications for depression, such as desvenlafaxine, duloxetine, levomilnacipran, venlafaxine Some medications for fungal infections, such as itraconazole or ketoconazole Some medications for seizures, such as carbamazepine or phenytoin SSRIs, medications for depression, such as citalopram, escitalopram, fluoxetine, fluvoxamine, paroxetine, sertraline St. John's wort Other medications may affect the way this medication works. Talk with your care team about all the medications you take. They may suggest changes to your treatment plan to lower the risk of side effects and to make sure your medications work as intended. This list may not describe all possible interactions. Give your health care provider a list of all the medicines, herbs, non-prescription drugs, or dietary supplements you  use. Also tell them if you smoke, drink alcohol, or use illegal drugs. Some items may interact with your medicine. What should I watch for while using this medication? Visit your care team  for regular checks on your progress. It is important to keep your scheduled visit. In children, this medication is dosed based on their weight. The dose will need to be adjusted by your care team as they grow and their weight changes. This ensures the child receives the correct dose. You may need blood work done while you are taking this medication. Avoid sports and activities that may cause injury while you are taking this medication. Severe falls or injuries can cause unseen bleeding. Be careful when using sharp tools or knives. Consider using an Neurosurgeon. Take special care brushing or flossing your teeth. Report any injuries, bruising, or red spots on the skin to your care team. Before having surgery, dental work, or another procedure, tell your care team that you are taking this medication. People who take this medication and have a spinal procedure are at risk of forming a blood clot in the space around the brain or spinal cord. This could cause paralysis (not being able to move). The risk is higher in people who have spinal problems or injuries, have had spinal surgery in the past, and for those with a tube (catheter) in their back. Taking other medications that also affect bleeding, such as NSAIDs or other blood thinners, can also increase the risk. Your care team will watch you closely. Let them know right away if you feel pain, tingling, or numbness in your legs or feet. Wear a medical ID bracelet or chain. Carry a card that describes your condition. List the medications and doses you take on the card. Talk to your care team if you may be pregnant. Serious fetal side effects, such as bleeding, may occur if you take this medication during pregnancy. There are benefits and risks to taking medications during pregnancy. Your care team can help you find the option that works for you. Talk to your care team before breastfeeding. Changes to your treatment plan may be needed. What side effects may I  notice from receiving this medication? Side effects that you should report to your care team as soon as possible: Allergic reactions--skin rash, itching, hives, swelling of the face, lips, tongue, or throat Bleeding--bloody or black, tar-like stools, vomiting blood or brown material that looks like coffee grounds, red or dark brown urine, small red or purple spots on skin, unusual bruising or bleeding Bleeding in the brain--severe headache, stiff neck, confusion, dizziness, change in vision, numbness or weakness of the face, arm, or leg, trouble speaking, trouble walking, vomiting Heavy periods Side effects that usually do not require medical attention (report these to your care team if they continue or are bothersome): Headache Vomiting This list may not describe all possible side effects. Call your doctor for medical advice about side effects. You may report side effects to FDA at 1-800-FDA-1088. Where should I keep my medication? Keep out of the reach of children and pets. Store at room temperature between 20 and 25 degrees C (68 and 77 degrees F). Get rid of any unused medication after the expiration date. To get rid of medications that are no longer needed or expired: Take the medication to a take-back program. Check with your pharmacy or law enforcement to find a location. If you cannot return the medication, check the label or package insert to see if the  medication should be thrown out in the garbage or flushed down the toilet. If you are not sure, ask your care team. If it is safe to put it in the trash, empty the medication out of the container. Mix it with cat litter, dirt, coffee grounds, or another unwanted substance. Seal the mixture in a bag or container. Put it in the trash. NOTE: This sheet is a summary. It may not cover all possible information. If you have questions about this medicine, talk to your doctor, pharmacist, or health care provider.  2025 Elsevier/Gold Standard  (2024-01-04 00:00:00)

## 2024-05-06 NOTE — Progress Notes (Signed)
 Subjective:    Patient ID: Melissa Knight, female    DOB: 1933-02-24, 89 y.o.   MRN: [id number]   Chief Complaint: staple removal  HPI  Patient had colon resection on 04/17/24. Has not had staples removed yet. Is here today for staple removal because it is difficult to go to [place] to have removed. She is doing well. Bowel movements have returned to normal. Appetite is increasing. Denies any pain. No nausea or vomiting.  Patient Active Problem List   Diagnosis Date Noted   Palliative care encounter 04/14/2024   Counseling and coordination of care 04/14/2024   Goals of care, counseling/discussion 04/14/2024   Malnutrition of moderate degree 04/11/2024   Coagulopathy (HCC) 04/09/2024   Hypokalemia 04/09/2024   History of embolic stroke 04/09/2024   Colonic mass 04/08/2024   Morbid obesity (HCC) 10/05/2016   Lichen sclerosus of female genitalia 10/10/2014   Stress incontinence 10/10/2014   Hypothyroidism (acquired) 06/05/2014   Metabolic syndrome 01/01/2014   Hyperlipidemia 02/05/2013   Permanent atrial fibrillation (HCC) 07/02/2012   Late effect of cerebrovascular accident 06/30/2012   Seizure disorder, generalized convulsive, intractable (HCC) 06/30/2012   Essential hypertension 06/30/2012       Review of Systems  Constitutional:  Negative for diaphoresis.  Eyes:  Negative for pain.  Respiratory:  Negative for shortness of breath.   Cardiovascular:  Negative for chest pain, palpitations and leg swelling.  Gastrointestinal:  Negative for abdominal pain.  Endocrine: Negative for polydipsia.  Skin:  Negative for rash.  Neurological:  Negative for dizziness, weakness and headaches.  Hematological:  Does not bruise/bleed easily.  All other systems reviewed and are negative.      Objective:   Physical Exam Constitutional:      Appearance: Normal appearance. She is obese.  Cardiovascular:     Rate and Rhythm: Normal rate and regular rhythm.     Heart sounds: Normal  heart sounds.  Pulmonary:     Effort: Pulmonary effort is normal.     Breath sounds: Normal breath sounds.  Skin:    General: Skin is warm.     Comments: Abdominal wound edges well approximated- no erythema or drainage  Neurological:     General: No focal deficit present.     Mental Status: She is alert and oriented to person, place, and time.  Psychiatric:        Mood and Affect: Mood normal.        Behavior: Behavior normal.      BP 124/72   Pulse 96   Temp 97.9 F (36.6 C) (Temporal)   Ht 5' 2 (1.575 m)   Wt 143 lb (64.9 kg)   BMI 26.16 kg/m        Assessment & Plan:  Melissa Knight in today with chief complaint of Suture / Staple Removal   1. Encounter for staple removal (Primary) Wound care discussed  2. Permanent atrial fibrillation (HCC) Started patient on eliquis  - patient choice No coumadin  - apixaban  (ELIQUIS ) 5 MG TABS tablet; Take 1 tablet (5 mg total) by mouth 2 (two) times daily.  Dispense: 60 tablet; Refill: 2    The above assessment and management plan was discussed with the patient. The patient verbalized understanding of and has agreed to the management plan. Patient is aware to call the clinic if symptoms persist or worsen. Patient is aware when to return to the clinic for a follow-up visit. Patient educated on when it is appropriate to go to the emergency department.  Mary-Margaret Gladis, FNP

## 2024-05-08 NOTE — Progress Notes (Deleted)
 New Hematology/Oncology Consult   Requesting MD: Dr. Krystal Spinner  [phone]      Reason for Consult: Colon cancer  HPI: [name] is an 89 year old woman referred due to recent diagnosis of colon cancer.  She presented to the emergency department 04/08/2024 with abdominal pain.  CT showed an annular constricting lesion in the mid transverse colon, mild proximal colonic dilatation; multiple pulmonary nodules noted, largest measured 4 mm.  Chest CT showed no evidence of metastatic disease.  CEA minimally elevated at 6.5.  Colonoscopy 04/13/2024 showed a frond-like/villous and infiltrative partially obstructing large mass in the distal transverse colon/proximal descending colon.  Biopsy was negative for dysplasia and malignancy.    She underwent a transverse colectomy by Dr. Spinner on 04/17/2024.  Pathology showed adenocarcinoma, 2.8 cm, invasive through the muscularis propria into pericolic tissue, all margins negative; metastatic adenocarcinoma present in 2 of 20 regional lymph nodes; pT3, pN1b; preserved expression of the major MMR proteins     Past Medical History:  Diagnosis Date   Acute ischemic stroke (HCC) 06/12/2012   acute right parietal ischemic stroke - likely cardioembolic in setting of AFib => coumadin  started and followed by PCP   Atrial fibrillation (HCC) 06/12/2012   Echocardiogram 06/30/12: EF 60-65%, mild MR, PASP 33.   Coagulopathy (HCC) 04/09/2024   Hyperlipidemia    Hypertension    Hypothyroidism    Lichen sclerosus et atrophicus of the vulva    PONV (postoperative nausea and vomiting)    Seizure (HCC) 06/12/2012   in the setting of acute ischemic stroke  :   Past Surgical History:  Procedure Laterality Date   BLADDER SURGERY     bladder prolapse   CATARACT EXTRACTION Left 07/2023   CATARACT EXTRACTION W/PHACO Left 08/04/2023   Procedure: CATARACT EXTRACTION PHACO AND INTRAOCULAR LENS PLACEMENT (IOC);  Surgeon: Juli Blunt, MD;  Location: [facility];   Service: Ophthalmology;  Laterality: Left;  CDE: 6.75   COLONOSCOPY N/A 04/13/2024   Procedure: COLONOSCOPY;  Surgeon: Elicia Claw, MD;  Location: [facility];  Service: Gastroenterology;  Laterality: N/A;   LAPAROTOMY N/A 04/17/2024   Procedure: EXPLORATORY LAPAROTOMY WITH TRANSVERSE COLECTOMY;  Surgeon: Spinner Krystal, MD;  Location: [facility];  Service: General;  Laterality: N/A;   TUBAL LIGATION       Current Outpatient Medications:    amLODipine  (NORVASC ) 2.5 MG tablet, Take 1 tablet (2.5 mg total) by mouth daily., Disp: 90 tablet, Rfl: 1   apixaban  (ELIQUIS ) 5 MG TABS tablet, Take 1 tablet (5 mg total) by mouth 2 (two) times daily., Disp: 60 tablet, Rfl: 2   atorvastatin  (LIPITOR) 10 MG tablet, Take 1 tablet (10 mg total) by mouth daily at 6 PM., Disp: 90 tablet, Rfl: 1   betamethasone  dipropionate 0.05 % cream, Apply daily as needed to affected area, Disp: 45 g, Rfl: 2   calcium -vitamin D (OSCAL WITH D) 500-200 MG-UNIT tablet, Take 1 tablet by mouth., Disp: , Rfl:    folic acid  (FOLVITE ) 1 MG tablet, TAKE 1 TABLET BY MOUTH EVERY DAY, Disp: 90 tablet, Rfl: 0   furosemide  (LASIX ) 20 MG tablet, TAKE 1 TABLET EVERY DAY AS NEEDED, Disp: 90 tablet, Rfl: 1   levETIRAcetam  (KEPPRA ) 500 MG tablet, Take 1 tablet (500 mg total) by mouth daily., Disp: 90 tablet, Rfl: 1   levothyroxine  (SYNTHROID ) 88 MCG tablet, Take 1 tablet (88 mcg total) by mouth daily., Disp: 90 tablet, Rfl: 0   metoprolol  tartrate (LOPRESSOR ) 50 MG tablet, TAKE 2 TABLETS BY MOUTH IN  MORNING AND 1 TABLET BY MOUTH EVERY NIGHT AT BEDTIME, Disp: 270 tablet, Rfl: 1   ondansetron  (ZOFRAN ) 4 MG tablet, Take 1 tablet (4 mg total) by mouth every 8 (eight) hours as needed for nausea or vomiting., Disp: 15 tablet, Rfl: 0   oxyCODONE  (OXY IR/ROXICODONE ) 5 MG immediate release tablet, Take 1 tablet (5 mg total) by mouth every 6 (six) hours as needed for severe pain (pain score 7-10)., Disp: 10 tablet, Rfl: 0   pantoprazole  (PROTONIX ) 40 MG  tablet, Take 1 tablet (40 mg total) by mouth daily., Disp: 15 tablet, Rfl: 0   TURMERIC PO, Take 1 Dose by mouth daily., Disp: , Rfl: :     Allergies  Allergen Reactions   Sulfa Antibiotics Rash    rash    FH:  SOCIAL HISTORY:  Review of Systems:  Physical Exam:  There were no vitals taken for this visit.  HEENT: *** Lungs: *** Cardiac: *** Abdomen: *** GU: ***  Vascular: *** Lymph nodes: *** Neurologic: *** Skin: *** Musculoskeletal: ***  LABS:  No results for input(s): WBC, HGB, HCT, PLT in the last 72 hours.  No results for input(s): NA, K, CL, CO2, GLUCOSE, BUN, CREATININE, CALCIUM  in the last 72 hours.    RADIOLOGY:  ECHOCARDIOGRAM COMPLETE Result Date: 04/15/2024    ECHOCARDIOGRAM REPORT   Patient Name:   Melissa Knight Date of Exam: 04/15/2024 Medical Rec #:  [id number]    Height:       62.0 in Accession #:    [id number]   Weight:       145.1 lb Date of Birth:  1933-03-30     BSA:          1.668 m Patient Age:    89 years     BP:           135/67 mmHg Patient Gender: F            HR:           97 bpm. Exam Location:  Inpatient Procedure: 2D Echo, Color Doppler and Cardiac Doppler (Both Spectral and Color            Flow Doppler were utilized during procedure). Indications:    Atrial fibrillation  History:        Patient has no prior history of Echocardiogram examinations.                 Risk Factors:Hypertension and Dyslipidemia.  Sonographer:    Philomena Daring Referring Phys: [id number] HAO MENG IMPRESSIONS  1. Left ventricular ejection fraction, by estimation, is 60 to 65%. The left ventricle has normal function. The left ventricle has no regional wall motion abnormalities. Left ventricular diastolic function could not be evaluated.  2. Right ventricular systolic function is normal. The right ventricular size is normal.  3. The mitral valve is normal in structure. Mild mitral valve regurgitation. No evidence of mitral stenosis.  4. The aortic valve is  tricuspid. There is mild calcification of the aortic valve. Aortic valve regurgitation is not visualized. Aortic valve sclerosis/calcification is present, without any evidence of aortic stenosis.  5. The inferior vena cava is normal in size with greater than 50% respiratory variability, suggesting right atrial pressure of 3 mmHg. FINDINGS  Left Ventricle: Left ventricular ejection fraction, by estimation, is 60 to 65%. The left ventricle has normal function. The left ventricle has no regional wall motion abnormalities. The left ventricular internal cavity size was normal in size. There is  no left ventricular hypertrophy. Left ventricular diastolic function could not be evaluated due to atrial fibrillation. Left ventricular diastolic function could not be evaluated. Right Ventricle: The right ventricular size is normal. No increase in right ventricular wall thickness. Right ventricular systolic function is normal. Left Atrium: Left atrial size was normal in size. Right Atrium: Right atrial size was normal in size. Pericardium: There is no evidence of pericardial effusion. Mitral Valve: The mitral valve is normal in structure. Mild mitral valve regurgitation. No evidence of mitral valve stenosis. Tricuspid Valve: The tricuspid valve is normal in structure. Tricuspid valve regurgitation is mild . No evidence of tricuspid stenosis. Aortic Valve: The aortic valve is tricuspid. There is mild calcification of the aortic valve. Aortic valve regurgitation is not visualized. Aortic valve sclerosis/calcification is present, without any evidence of aortic stenosis. Pulmonic Valve: The pulmonic valve was normal in structure. Pulmonic valve regurgitation is not visualized. No evidence of pulmonic stenosis. Aorta: The aortic root is normal in size and structure. Venous: The inferior vena cava is normal in size with greater than 50% respiratory variability, suggesting right atrial pressure of 3 mmHg. IAS/Shunts: No atrial level  shunt detected by color flow Doppler.  LEFT VENTRICLE PLAX 2D LVIDd:         3.50 cm     Diastology LVIDs:         2.80 cm     LV e' medial:    6.74 cm/s LV PW:         1.30 cm     LV E/e' medial:  12.5 LV IVS:        1.20 cm     LV e' lateral:   10.30 cm/s LVOT diam:     1.60 cm     LV E/e' lateral: 8.2 LV SV:         27 LV SV Index:   16 LVOT Area:     2.01 cm  LV Volumes (MOD) LV vol d, MOD A2C: 41.3 ml LV vol d, MOD A4C: 58.9 ml LV vol s, MOD A2C: 13.8 ml LV vol s, MOD A4C: 18.5 ml LV SV MOD A2C:     27.5 ml LV SV MOD A4C:     58.9 ml LV SV MOD BP:      33.8 ml RIGHT VENTRICLE             IVC RV S prime:     11.40 cm/s  IVC diam: 1.20 cm TAPSE (M-mode): 1.4 cm LEFT ATRIUM             Index        RIGHT ATRIUM           Index LA Vol (A2C):   38.7 ml 23.20 ml/m  RA Area:     16.10 cm LA Vol (A4C):   49.0 ml 29.38 ml/m  RA Volume:   36.00 ml  21.59 ml/m LA Biplane Vol: 45.0 ml 26.98 ml/m  AORTIC VALVE LVOT Vmax:   77.10 cm/s LVOT Vmean:  49.200 cm/s LVOT VTI:    0.132 m  AORTA Ao Root diam: 3.00 cm Ao Asc diam:  3.80 cm MITRAL VALVE               TRICUSPID VALVE MV Area (PHT): 3.49 cm    TR Peak grad:   24.8 mmHg MV E velocity: 84.40 cm/s  TR Vmax:        249.00 cm/s  SHUNTS                            Systemic VTI:  0.13 m                            Systemic Diam: 1.60 cm Morene Brownie Electronically signed by Morene Brownie Signature Date/Time: 04/15/2024/5:49:34 PM    Final    CT CHEST W CONTRAST Result Date: 04/15/2024 CLINICAL DATA:  Colon cancer, assess treatment response. * Tracking Code: BO * EXAM: CT CHEST WITH CONTRAST TECHNIQUE: Multidetector CT imaging of the chest was performed during intravenous contrast administration. RADIATION DOSE REDUCTION: This exam was performed according to the departmental dose-optimization program which includes automated exposure control, adjustment of the mA and/or kV according to patient size and/or use of iterative reconstruction  technique. CONTRAST:  75mL OMNIPAQUE  IOHEXOL  300 MG/ML  SOLN COMPARISON:  None Available. FINDINGS: Cardiovascular: Normal cardiac size. No pericardial effusion. No aortic aneurysm. There are coronary artery calcifications, in keeping with coronary artery disease. There are also moderate peripheral atherosclerotic vascular calcifications of thoracic aorta and its major branches. Mediastinum/Nodes: Visualized thyroid  gland appears grossly unremarkable. No solid / cystic mediastinal masses. The esophagus is nondistended precluding optimal assessment. There are multiple partially calcified mediastinal and right hilar lymph nodes, which are nonspecific but commonly seen as a sequela of prior granulomatous infection. No axillary or left hilar lymphadenopathy by size criteria. Lungs/Pleura: The central tracheo-bronchial tree is patent. There multiple calcified granulomas throughout bilateral lungs with largest in the right upper lobe measuring up to 6 mm. No suspicious lung nodule. There is trace left pleural effusion, which is new since the prior study from 04/08/2024. no right pleural effusion. No mass, consolidation or pneumothorax. Upper Abdomen: Multiple calcified granulomas noted in the spleen and liver. There is an additional subcentimeter hypoattenuating structure in the right hepatic lobe, which is too small to adequately characterize. Small diverticulum noted arising from the second part of duodenum. Remaining visualized upper abdominal viscera within normal limits. Musculoskeletal: The visualized soft tissues of the chest wall are grossly unremarkable. No suspicious osseous lesions. There are mild to moderate multilevel degenerative changes in the visualized spine. IMPRESSION: 1. No metastatic disease identified within the chest. 2. Multiple other nonacute observations, as described above. Aortic Atherosclerosis (ICD10-I70.0). Electronically Signed   By: Ree Molt M.D.   On: 04/15/2024 12:00   CT ABDOMEN  PELVIS W CONTRAST Result Date: 04/08/2024 CLINICAL DATA:  Abdominal pain, diarrhea, vomiting, bilateral lower quadrant pain. Symptoms began on Friday. EXAM: CT ABDOMEN AND PELVIS WITH CONTRAST TECHNIQUE: Multidetector CT imaging of the abdomen and pelvis was performed using the standard protocol following bolus administration of intravenous contrast. RADIATION DOSE REDUCTION: This exam was performed according to the departmental dose-optimization program which includes automated exposure control, adjustment of the mA and/or kV according to patient size and/or use of iterative reconstruction technique. CONTRAST:  OMNIPAQUE  IOHEXOL  300 MG/ML  SOLN COMPARISON:  None Available. FINDINGS: Lower chest: Motion artifact limits examination. Tiny low-density pulmonary nodules are demonstrated. Perhaps the most prominent is in the right lower lung, series 5, image 14, measuring 4 mm in diameter. Linear scarring in the right base. Hepatobiliary: Scattered calcifications in the liver, likely granulomas. Gallbladder and bile ducts are normal. Pancreas: Unremarkable. No pancreatic ductal dilatation or surrounding inflammatory changes. Spleen: Multiple calcified granulomas in the spleen. Adrenals/Urinary Tract: No adrenal gland nodules. Lower pole  left renal cyst measuring 4.4 cm diameter. No imaging follow-up is indicated. Nephrograms are symmetrical. No hydronephrosis or hydroureter. Bladder is normal for degree of distention. Stomach/Bowel: Stomach and small bowel are mostly decompressed. Small duodenal diverticulum. Liquid stool demonstrated throughout the colon. The right hemicolon is moderately distended. There is an annular constricting mass in the mid transverse colon likely representing colon cancer. The appendix is normal. Vascular/Lymphatic: Aortic atherosclerosis. No enlarged abdominal or pelvic lymph nodes. Reproductive: Uterus and bilateral adnexa are unremarkable. Other: Small amount of free fluid in the upper  abdomen and pelvis is probably reactive. No free air. Abdominal wall musculature appears intact. Musculoskeletal: Degenerative changes in the spine. No acute bony abnormalities. No destructive bone lesions. IMPRESSION: 1. There is an annular constricting lesion in the mid transverse colon likely representing colon cancer. There is mild proximal colonic dilatation. 2. Liquid stool in the colon likely representing infectious colitis. 3. Appendix is normal. 4. Aortic atherosclerosis. 5. Multiple pulmonary nodules, largest measuring 4 mm. In the setting of probable neoplasm, metastatic disease is not excluded although still unlikely. Oncological follow-up is recommended. Electronically Signed   By: Elsie Gravely M.D.   On: 04/08/2024 20:49    Assessment and Plan:   ***    Olam Ned, NP 05/08/2024, 3:51 PM

## 2024-05-09 ENCOUNTER — Inpatient Hospital Stay: Attending: Nurse Practitioner | Admitting: Nurse Practitioner

## 2024-05-09 NOTE — Progress Notes (Signed)
   PROVIDER:  KRYSTAL OZELL SPINNER, MD  MRN: [id number] DOB: 06/20/1933 DATE OF ENCOUNTER: 05/09/2024 Interval History:   Patient returns for postoperative visit having undergone partial colectomy on April 17, 2024.  Patient had a near obstructing adenocarcinoma of the transverse colon measuring 2.8 cm in greatest dimension with 2 of 20 lymph nodes positive for metastatic adenocarcinoma.  Patient was discussed at GI tumor board.  She has been evaluated by Dr. Arvella Hof.  Patient is tolerating a regular diet.  She denies any abdominal pain.  She is having normal bowel movements.  She is very pleased with her results after surgery.  Physical Examination:   Abdomen is soft without distention.  Upper midline surgical incision is healing nicely.  No sign of herniation.  No sign of infection.  No palpable masses.  No tenderness.   Assessment and Plan:    Diagnoses and all orders for this visit:  Adenocarcinoma, colon (CMS/HHS-HCC)  S/P partial colectomy    Patient has done very well following partial colectomy for adenocarcinoma of the colon.  Patient will begin applying topical creams to her incision as instructed below.  Patient will be seen in follow-up by Dr. Arvella Hof from medical oncology.  Patient will return in 6 weeks for wound check.  CARE OF SURGICAL INCISION   If there is still some Dermabond on your incision, use Vaseline to remove completely.  Apply Palmer's Cocoa Butter with Vitamin E cream to your incision 2 or 3 times daily.  Massage cream into incision for one minute with each application using small circular motions.  Use sunscreen (50 SPF or higher) for the first 6 months after surgery if area is exposed to sun.  You may alternate Mederma or other scar reducing cream with the cocoa butter cream if desired.   KRYSTAL SPINNER, MD [facility] Surgery Office: [phone]

## 2024-05-14 ENCOUNTER — Encounter: Payer: Self-pay | Admitting: *Deleted

## 2024-05-14 NOTE — Progress Notes (Signed)
 PATIENT NAVIGATOR PROGRESS NOTE  Name: Melissa Knight Date: 05/14/2024 MRN: [id number]  DOB: November 18, 1932   Reason for visit:  New pt appt  Comments:  Called and spoke with Melissa Knight and have her scheduled with Dr Cloretta on  9/11 at 1:40pm. Reviewed directions to building and parking as well as one support person allowed in the appt.  Given contact number to call with any questions   Time spent counseling/coordinating care: 30-45 minutes

## 2024-05-16 ENCOUNTER — Other Ambulatory Visit: Payer: Self-pay | Admitting: Nurse Practitioner

## 2024-05-16 DIAGNOSIS — E039 Hypothyroidism, unspecified: Secondary | ICD-10-CM

## 2024-05-21 ENCOUNTER — Other Ambulatory Visit: Payer: Self-pay | Admitting: Nurse Practitioner

## 2024-05-21 DIAGNOSIS — I10 Essential (primary) hypertension: Secondary | ICD-10-CM

## 2024-05-21 DIAGNOSIS — G40319 Generalized idiopathic epilepsy and epileptic syndromes, intractable, without status epilepticus: Secondary | ICD-10-CM

## 2024-05-23 ENCOUNTER — Inpatient Hospital Stay: Attending: Nurse Practitioner | Admitting: Oncology

## 2024-05-23 ENCOUNTER — Inpatient Hospital Stay

## 2024-05-23 ENCOUNTER — Other Ambulatory Visit: Payer: Self-pay | Admitting: *Deleted

## 2024-05-23 ENCOUNTER — Ambulatory Visit: Payer: Self-pay | Admitting: Oncology

## 2024-05-23 VITALS — BP 133/67 | HR 60 | Temp 97.7°F | Resp 18 | Ht 62.0 in | Wt 147.6 lb

## 2024-05-23 DIAGNOSIS — E039 Hypothyroidism, unspecified: Secondary | ICD-10-CM | POA: Diagnosis not present

## 2024-05-23 DIAGNOSIS — K6389 Other specified diseases of intestine: Secondary | ICD-10-CM

## 2024-05-23 DIAGNOSIS — Z8673 Personal history of transient ischemic attack (TIA), and cerebral infarction without residual deficits: Secondary | ICD-10-CM | POA: Diagnosis not present

## 2024-05-23 DIAGNOSIS — I10 Essential (primary) hypertension: Secondary | ICD-10-CM | POA: Insufficient documentation

## 2024-05-23 DIAGNOSIS — D649 Anemia, unspecified: Secondary | ICD-10-CM | POA: Diagnosis not present

## 2024-05-23 DIAGNOSIS — C184 Malignant neoplasm of transverse colon: Secondary | ICD-10-CM | POA: Insufficient documentation

## 2024-05-23 DIAGNOSIS — Z87891 Personal history of nicotine dependence: Secondary | ICD-10-CM | POA: Diagnosis not present

## 2024-05-23 DIAGNOSIS — I4891 Unspecified atrial fibrillation: Secondary | ICD-10-CM | POA: Insufficient documentation

## 2024-05-23 DIAGNOSIS — E785 Hyperlipidemia, unspecified: Secondary | ICD-10-CM | POA: Diagnosis not present

## 2024-05-23 LAB — CBC WITH DIFFERENTIAL (CANCER CENTER ONLY)
Abs Immature Granulocytes: 0.03 K/uL (ref 0.00–0.07)
Basophils Absolute: 0 K/uL (ref 0.0–0.1)
Basophils Relative: 1 %
Eosinophils Absolute: 0.1 K/uL (ref 0.0–0.5)
Eosinophils Relative: 1 %
HCT: 34 % — ABNORMAL LOW (ref 36.0–46.0)
Hemoglobin: 11.1 g/dL — ABNORMAL LOW (ref 12.0–15.0)
Immature Granulocytes: 0 %
Lymphocytes Relative: 32 %
Lymphs Abs: 2.6 K/uL (ref 0.7–4.0)
MCH: 30.2 pg (ref 26.0–34.0)
MCHC: 32.6 g/dL (ref 30.0–36.0)
MCV: 92.4 fL (ref 80.0–100.0)
Monocytes Absolute: 0.8 K/uL (ref 0.1–1.0)
Monocytes Relative: 10 %
Neutro Abs: 4.5 K/uL (ref 1.7–7.7)
Neutrophils Relative %: 56 %
Platelet Count: 196 K/uL (ref 150–400)
RBC: 3.68 MIL/uL — ABNORMAL LOW (ref 3.87–5.11)
RDW: 14.7 % (ref 11.5–15.5)
WBC Count: 8.1 K/uL (ref 4.0–10.5)
nRBC: 0 % (ref 0.0–0.2)

## 2024-05-23 LAB — CEA (ACCESS): CEA (CHCC): 5.6 ng/mL — ABNORMAL HIGH (ref 0.00–5.00)

## 2024-05-23 MED ORDER — COVID-19 MRNA VAC-TRIS(PFIZER) 30 MCG/0.3ML IM SUSY: 0.3000 mL | PREFILLED_SYRINGE | Freq: Once | INTRAMUSCULAR | 0 refills | Status: AC

## 2024-05-23 NOTE — Progress Notes (Signed)
 [facility] New Patient Consult   Requesting MD: [name], Fnp [address] [place],  [place] [address]   Melissa Knight 89 y.o.  1933-04-27    Reason for Consult: Colon cancer   HPI: [name] presented to the emergency room on 04/08/2024 with cramping abdominal discomfort, nausea, and diarrhea.  She reports a history of rectal bleeding for the past year. A CT abdomen/pelvis revealed distention of the right colon with a constricting mass in the mid transverse colon.  No enlarged abdominal pelvic lymph nodes. Dr. Redell Birk was consulted and a colonoscopy on 04/13/2024 revealed a infiltrating large mass in the distal transverse colon.  Biopsies were obtained and the area was tattooed.  Polyps were removed from the sigmoid colon.  The pathology revealed sessile serrated adenoma and a hyperplastic polyp of the sigmoid colon.  The transverse colon mass biopsy revealed a hyperplastic polyp.  Dr. Eletha was consulted and she was taken to the operating room on 04/17/2024 for a transverse colectomy.  A mass was noted in the mid transverse colon.  There was no evidence of metastatic disease.  She was discharged to home 04/24/2024.  She feels well.  She is here today with her daughter-in-law.  Past Medical History:  Diagnosis Date   Acute ischemic stroke (HCC) 06/12/2012   acute right parietal ischemic stroke - likely cardioembolic in setting of AFib => coumadin  started and followed by PCP   Atrial fibrillation (HCC) 06/12/2012   Echocardiogram 06/30/12: EF 60-65%, mild MR, PASP 33.   Coagulopathy (HCC) 04/09/2024   Hyperlipidemia    Hypertension    Hypothyroidism    Lichen sclerosus et atrophicus of the vulva    PONV (postoperative nausea and vomiting)    Seizure (HCC) 06/12/2012   in the setting of acute ischemic stroke    .  G4, P3, 1 miscarriage   .  Transverse colon cancer August 2025  Past Surgical History:  Procedure Laterality Date   BLADDER SURGERY      bladder prolapse   CATARACT EXTRACTION Left 07/2023   CATARACT EXTRACTION W/PHACO Left 08/04/2023   Procedure: CATARACT EXTRACTION PHACO AND INTRAOCULAR LENS PLACEMENT (IOC);  Surgeon: Juli Blunt, MD;  Location: [facility];  Service: Ophthalmology;  Laterality: Left;  CDE: 6.75   COLONOSCOPY N/A 04/13/2024   Procedure: COLONOSCOPY;  Surgeon: Elicia Claw, MD;  Location: [facility];  Service: Gastroenterology;  Laterality: N/A;   LAPAROTOMY N/A 04/17/2024   Procedure: EXPLORATORY LAPAROTOMY WITH TRANSVERSE COLECTOMY;  Surgeon: Eletha Boas, MD;  Location: [facility];  Service: General;  Laterality: N/A;   TUBAL LIGATION      Medications: Reviewed  Allergies:  Allergies  Allergen Reactions   Sulfa Antibiotics Rash    rash    Family history: A brother had prostate cancer  Social History:   She lives with her husband in [place].  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  She quit smoking cigarettes in 1985.  No alcohol use.  No transfusion history.  No risk factor for HIV or hepatitis.  ROS:   Positives include: Urinary incontinence, blood in the stool for 1 year, cramping abdominal pain with nausea and vomiting for 2 days prior to hospital admission  A complete ROS was otherwise negative.  Physical Exam:  Blood pressure 133/67, pulse 60, temperature 97.7 F (36.5 C), temperature source Oral, resp. rate 18, height 5' 2 (1.575 m), weight 147 lb 9.6 oz (67 kg), SpO2 99%.  HEENT: Oropharynx without  visible mass, neck without mass Lungs: Clear bilaterally, no respiratory distress Cardiac: Regular rate and rhythm Abdomen: Healed midline incision, no hepatosplenomegaly, no mass  Vascular: No leg edema Lymph nodes: No cervical, supraclavicular, axillary, or inguinal nodes Neurologic: Alert and oriented, the motor exam appears intact in the upper and lower extremities bilaterally Skin: No rash Musculoskeletal: No spine tenderness   LAB:  CBC  Lab Results   Component Value Date   WBC 6.2 04/24/2024   HGB 9.1 (L) 04/24/2024   HCT 29.7 (L) 04/24/2024   MCV 93.4 04/24/2024   PLT 235 04/24/2024   NEUTROABS 3.9 05/08/2023        CMP  Lab Results  Component Value Date   NA 138 04/23/2024   K 3.6 04/23/2024   CL 107 04/23/2024   CO2 25 04/23/2024   GLUCOSE 89 04/23/2024   BUN 10 04/23/2024   CREATININE 0.61 04/23/2024   CALCIUM  8.3 (L) 04/23/2024   PROT 5.1 (L) 04/09/2024   ALBUMIN 2.7 (L) 04/09/2024   AST 14 (L) 04/09/2024   ALT 9 04/09/2024   ALKPHOS 27 (L) 04/09/2024   BILITOT 1.0 04/09/2024   GFRNONAA >60 04/23/2024   GFRAA 89 11/03/2020     Lab Results  Component Value Date   CEA1 6.5 (H) 04/14/2024    Imaging:  As per HPI, CT images reviewed    Assessment/Plan:   Transverse colon cancer, status post a transverse colectomy 04/17/2024, stage III (T3 N1), 2/20 nodes positive MSS, no loss of mismatch repair protein expression 04/08/2024 CT abdomen/pelvis: Annular constricting lesion in the mid transverse colon with mild proximal colonic dilation, multiple pulmonary nodules-largest 4 mm 04/13/2024 colonoscopy: Partially obstructing mass in the distal transverse colon, 2 sigmoid colon polyps removed: Transverse colon mass-hyperplastic polyp, sigmoid polyps-serrated adenoma and hyperplastic polyp 04/14/2024 CT chest: Multiple calcified granulomas, no suspicious lung nodule, no metastatic disease 04/14/2024-elevated CEA  Anemia secondary #1 History of a CVA/seizure-2013 Atrial fibrillation   Disposition:   [name] has been diagnosed with stage III colon cancer.  I discussed the prognosis and reviewed details of the surgical pathology report with Ms. Hogen.  She has a significant chance of developing recurrent colon cancer over the next several years based on the tumor stage including 2 positive lymph nodes.  We discussed the benefit associated with adjuvant 5-fluorouracil chemotherapy in this setting.  I discussed the  expected decrease in the relapse rate with 5-fluorouracil.  She is 42, but appears younger than her biologic age and had a good performance status prior to becoming symptomatic from colon cancer.  I recommend adjuvant capecitabine.  We reviewed potential toxicities associated with capecitabine.  Ms. Adan indicates she does not wish to receive adjuvant chemotherapy.  She will return to the lab for a CBC and CEA.  She will return for an office visit and CEA in 3 months.  She understands there are treatment options if she develops recurrent colon cancer, but treatment will not be curative.  Ms. Josten does not appear to have hereditary nonpolyposis colon cancer syndrome, but her family members are at increased risk of developing colorectal cancer and should receive appropriate screening.    Arley Hof, MD  05/23/2024, 2:23 PM

## 2024-05-24 NOTE — Telephone Encounter (Signed)
-----   Message from Arley Hof sent at 05/23/2024  4:58 PM EDT ----- Please call patient with the hemoglobin is higher, the CEA remains mildly elevated, repeat CEA in 1 month, follow-up as scheduled. I canceled the chest CT, the radiologist feels the lung nodules are calcified and likely represent old areas of infection, no need for follow-up chest CT  ----- Message ----- From: Interface, Lab In [place] Sent: 05/23/2024   3:06 PM EDT To: Arley KATHEE Hof, MD

## 2024-05-24 NOTE — Telephone Encounter (Signed)
 Notified Melissa Knight that her  hemoglobin is higher, the CEA remains mildly elevated, repeat CEA in 1 month, follow-up as scheduled. The chest CT has been canceled. the radiologist feels the lung nodules are calcified and likely represent old areas of infection, no need for follow-up chest CT. Lab scheduled for 10/8

## 2024-06-04 ENCOUNTER — Telehealth: Payer: Self-pay

## 2024-06-04 NOTE — Telephone Encounter (Signed)
 The patient contacted us  to indicate her readiness to commence Xeloda therapy, and she has decided to proceed with the treatment. I informed her that I would relay this information to the provider.

## 2024-06-10 ENCOUNTER — Inpatient Hospital Stay (HOSPITAL_BASED_OUTPATIENT_CLINIC_OR_DEPARTMENT_OTHER): Admitting: Oncology

## 2024-06-10 VITALS — BP 145/82 | HR 68 | Temp 97.8°F | Resp 18 | Ht 62.0 in | Wt 151.0 lb

## 2024-06-10 DIAGNOSIS — E785 Hyperlipidemia, unspecified: Secondary | ICD-10-CM | POA: Diagnosis not present

## 2024-06-10 DIAGNOSIS — C184 Malignant neoplasm of transverse colon: Secondary | ICD-10-CM | POA: Diagnosis not present

## 2024-06-10 DIAGNOSIS — D649 Anemia, unspecified: Secondary | ICD-10-CM | POA: Diagnosis not present

## 2024-06-10 DIAGNOSIS — I4891 Unspecified atrial fibrillation: Secondary | ICD-10-CM | POA: Diagnosis not present

## 2024-06-10 DIAGNOSIS — Z8673 Personal history of transient ischemic attack (TIA), and cerebral infarction without residual deficits: Secondary | ICD-10-CM | POA: Diagnosis not present

## 2024-06-10 DIAGNOSIS — E039 Hypothyroidism, unspecified: Secondary | ICD-10-CM | POA: Diagnosis not present

## 2024-06-10 MED ORDER — CAPECITABINE 500 MG PO TABS: ORAL_TABLET | ORAL | 0 refills | Status: DC

## 2024-06-10 NOTE — Progress Notes (Signed)
  [facility] OFFICE PROGRESS NOTE   Diagnosis: Colon cancer  INTERVAL HISTORY:   [name] returns as scheduled.  She is here with her son.  She is eating.  She is having bowel movements.  No complaint. She has decided to proceed with adjuvant capecitabine. Objective:  Vital signs in last 24 hours:  Blood pressure (!) 145/82, pulse 68, temperature 97.8 F (36.6 C), temperature source Temporal, resp. rate 18, height 5' 2 (1.575 m), weight 151 lb (68.5 kg), SpO2 98%.   Resp: Lungs clear bilaterally Cardio: Regular rate and rhythm GI: No hepatosplenomegaly, nontender, healed midline incision Vascular: No leg edema   Lab Results:  Lab Results  Component Value Date   WBC 8.1 05/23/2024   HGB 11.1 (L) 05/23/2024   HCT 34.0 (L) 05/23/2024   MCV 92.4 05/23/2024   PLT 196 05/23/2024   NEUTROABS 4.5 05/23/2024    CMP  Lab Results  Component Value Date   NA 138 04/23/2024   K 3.6 04/23/2024   CL 107 04/23/2024   CO2 25 04/23/2024   GLUCOSE 89 04/23/2024   BUN 10 04/23/2024   CREATININE 0.61 04/23/2024   CALCIUM  8.3 (L) 04/23/2024   PROT 5.1 (L) 04/09/2024   ALBUMIN 2.7 (L) 04/09/2024   AST 14 (L) 04/09/2024   ALT 9 04/09/2024   ALKPHOS 27 (L) 04/09/2024   BILITOT 1.0 04/09/2024   GFRNONAA >60 04/23/2024   GFRAA 89 11/03/2020    Lab Results  Component Value Date   CEA1 6.5 (H) 04/14/2024   CEA 5.60 (H) 05/23/2024    Lab Results  Component Value Date   INR 1.0 04/25/2024   LABPROT 15.5 (H) 04/24/2024    Imaging:  No results found.  Medications: I have reviewed the patient's current medications.   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, stage III (T3 N1), 2/20 nodes positive MSS, no loss of mismatch repair protein expression 04/08/2024 CT abdomen/pelvis: Annular constricting lesion in the mid transverse colon with mild proximal colonic dilation, multiple pulmonary nodules-largest 4  mm 04/13/2024 colonoscopy: Partially obstructing mass in the distal transverse colon, 2 sigmoid colon polyps removed: Transverse colon mass-hyperplastic polyp, sigmoid polyps-serrated adenoma and hyperplastic polyp 04/14/2024 CT chest: Multiple calcified granulomas, no suspicious lung nodule, no metastatic disease 04/14/2024-elevated CEA  Anemia secondary #1 History of a CVA/seizure-2013 Atrial fibrillation    Disposition: [name] has a history of stage III colon cancer.  She has decided to proceed with adjuvant capecitabine.  The CEA was mildly elevated 04/14/2024.  We will repeat the CEA when she returns following cycle 1 capecitabine.  We will check a Signatera assay if the CEA remains elevated.  We reviewed potential toxicities associated with capecitabine including the chance of mucositis, diarrhea, hematologic toxicity, infection, bleeding, and cardiac toxicity.  We discussed the sun sensitivity, rash, hyperpigmentation, and hand/foot syndrome associated with capecitabine.  She agrees to proceed.  She will discontinue folic acid .  The plan is to begin adjuvant capecitabine 06/17/2024.  She will return for an office and lab visit 07/03/2024.  Arley Hof, MD  06/10/2024  2:45 PM

## 2024-06-11 ENCOUNTER — Telehealth: Payer: Self-pay | Admitting: Pharmacy Technician

## 2024-06-11 ENCOUNTER — Encounter: Payer: Self-pay | Admitting: Pharmacist

## 2024-06-11 ENCOUNTER — Telehealth: Payer: Self-pay | Admitting: Pharmacist

## 2024-06-11 ENCOUNTER — Other Ambulatory Visit (HOSPITAL_COMMUNITY): Payer: Self-pay

## 2024-06-11 ENCOUNTER — Other Ambulatory Visit: Payer: Self-pay

## 2024-06-11 ENCOUNTER — Other Ambulatory Visit: Payer: Self-pay | Admitting: Pharmacy Technician

## 2024-06-11 DIAGNOSIS — C184 Malignant neoplasm of transverse colon: Secondary | ICD-10-CM

## 2024-06-11 MED ORDER — CAPECITABINE 500 MG PO TABS
ORAL_TABLET | ORAL | 0 refills | Status: DC
  Filled 2024-06-11: qty 70, 21d supply, fill #0

## 2024-06-11 NOTE — Telephone Encounter (Signed)
 Clinical Pharmacist Practitioner Encounter   Received new prescription for Xeloda (capecitabine) for the adjuvant treatment of stage III colon cancer, planned duration 3-6 months. Planned start 06/17/24  BMP from 04/23/24 assessed, no relevant lab abnormalities. Prescription dose and frequency assessed.   Current medication list in Epic reviewed, no relevant DDIs with capecitabine identified.  Evaluated chart and no patient barriers to medication adherence identified.   Prescription has been e-scribed to the [facility] for benefits analysis and approval.  Oral Oncology Clinic will continue to follow for insurance authorization, copayment issues, initial counseling and start date.   Melissa Knight, PharmD, BCOP, CPP Hematology/Oncology Clinical Pharmacist [facility] Oral Chemotherapy Navigation Clinic [phone]  06/11/2024 11:29 AM

## 2024-06-11 NOTE — Progress Notes (Signed)
 Patient education documented in EPIC note on 06/11/24.

## 2024-06-11 NOTE — Telephone Encounter (Signed)
 Encounter opened in error

## 2024-06-11 NOTE — Telephone Encounter (Signed)
 Oral Oncology Patient Advocate Encounter  After completing a benefits investigation, prior authorization for Capecitabine is not required at this time through Medicare Part B.  Patient's copay is $5.54.     Brenae Lasecki (Patty) Chet Burnet, CPhT  [facility], Zelda Salmon, [facility] - Oral Chemotherapy Patient Advocate Specialist III Phone: [phone]  Fax: [phone]

## 2024-06-11 NOTE — Progress Notes (Signed)
 Specialty Pharmacy Initial Fill Coordination Note  Melissa Knight is a 89 y.o. female contacted today regarding refills of specialty medication(s) Capecitabine (XELODA) .  Patient requested Delivery  on 06/13/24  to verified address [address]  [place] [address]   Medication will be filled on 10/01.   Patient is aware of $5.54 copayment.   Melissa Knight (Melissa Knight) Melissa Knight, CPhT  [facility], Melissa Knight, [facility] - Oral Chemotherapy Patient Advocate Specialist III Phone: [phone]  Fax: [phone]

## 2024-06-11 NOTE — Telephone Encounter (Signed)
 Clinical Pharmacist Practitioner Encounter   [facility] (Specialty) will deliver medication to patient on 06/13/24.  Patient knows not to start until 06/17/24.   Patient Education I spoke with patient for overview of new oral chemotherapy medication: Xeloda (capecitabine) for the adjuvant treatment of stage III colon cancer, planned duration 3-6 months. Planned start 06/17/24   Treatment goal: Curative  Counseled patient on administration, dosing, side effects, monitoring, drug-food interactions, safe handling, storage, and disposal. Patient will take 3 tablets (1500 mg) by mouth in AM and 2 tablets (1000 mg) in PM. Take with food. Take for 14 days, then hold for 7 days. Repeat every 21 days.   Side effects include but not limited to: diarrhea, hand-foot syndrome, mouth sores, edema, decreased wbc, fatigue, N/V Diarrhea: Patient knows to use loperamide as needed and call the office if they are having 4 or more loose stools per day. Hand-foot syndrome: Recommended the use of Udderly Smooth Extra Care 20. Also recommended patient use diclofenac gel, 1 fingertip amount on palms of hands and soles of feet twice daily. Patient knows to report any skin changes they notice.  Mouth sores: Patient knows to request magic mouthwash if needed.    Reviewed with patient importance of keeping a medication schedule and plan for any missed doses.  After discussion with patient no patient barriers to medication adherence identified.   Distress evaluation: Distress thermometer completed during telephone call and reviewed with patient. Due to score, social work referral has not been sent.  Communication and Learning Assessment Primary learner: patient Barriers to learning: No barriers Preferred language: English Learning preferences: Listening Reading   Ms. Yebra voiced understanding and appreciation. All questions answered. Medication handout provided.  Provided patient with Oral Chemotherapy  Navigation Clinic phone number. Patient knows to call the office with questions or concerns. Oral Chemotherapy Navigation Clinic will continue to follow.  Karman Veney N. Hamish Banks, PharmD, BCOP, CPP Hematology/Oncology Clinical Pharmacist [facility] Oral Chemotherapy Navigation Clinic [phone]  06/11/2024 2:36 PM

## 2024-06-11 NOTE — Telephone Encounter (Signed)
 Oral Oncology Patient Advocate Encounter  Patient successfully OnBoarded and drug education provided by pharmacist. Medication scheduled to be shipped on 10/01 for delivery on 10/02 from [facility] to patient's address. Patient also knows to call me at [phone] with any questions or concerns regarding receiving medication or if there is any unexpected change in co-pay.    Roneisha Stern (Patty) Chet Burnet, CPhT  [facility], Zelda Salmon, [facility] - Oral Chemotherapy Patient Advocate Specialist III Phone: [phone]  Fax: [phone]

## 2024-06-11 NOTE — Progress Notes (Signed)
 Encounter opened in error

## 2024-06-12 ENCOUNTER — Other Ambulatory Visit: Payer: Self-pay

## 2024-06-18 ENCOUNTER — Other Ambulatory Visit: Payer: Self-pay

## 2024-06-19 ENCOUNTER — Other Ambulatory Visit

## 2024-06-22 ENCOUNTER — Other Ambulatory Visit: Payer: Self-pay | Admitting: Nurse Practitioner

## 2024-06-22 DIAGNOSIS — I10 Essential (primary) hypertension: Secondary | ICD-10-CM

## 2024-06-22 DIAGNOSIS — I4821 Permanent atrial fibrillation: Secondary | ICD-10-CM

## 2024-06-22 DIAGNOSIS — E782 Mixed hyperlipidemia: Secondary | ICD-10-CM

## 2024-06-28 ENCOUNTER — Other Ambulatory Visit: Payer: Self-pay

## 2024-06-28 ENCOUNTER — Other Ambulatory Visit: Payer: Self-pay | Admitting: Oncology

## 2024-06-28 DIAGNOSIS — C184 Malignant neoplasm of transverse colon: Secondary | ICD-10-CM

## 2024-07-01 ENCOUNTER — Other Ambulatory Visit: Payer: Self-pay

## 2024-07-02 ENCOUNTER — Other Ambulatory Visit: Payer: Self-pay

## 2024-07-02 ENCOUNTER — Other Ambulatory Visit (HOSPITAL_COMMUNITY): Payer: Self-pay

## 2024-07-02 ENCOUNTER — Other Ambulatory Visit: Payer: Self-pay | Admitting: Oncology

## 2024-07-02 DIAGNOSIS — C184 Malignant neoplasm of transverse colon: Secondary | ICD-10-CM

## 2024-07-02 MED ORDER — CAPECITABINE 500 MG PO TABS
ORAL_TABLET | ORAL | 0 refills | Status: DC
  Filled 2024-07-02: qty 70, 21d supply, fill #0

## 2024-07-02 NOTE — Progress Notes (Signed)
 Specialty Pharmacy Refill Coordination Note  Melissa Knight is a 89 y.o. female contacted today regarding refills of specialty medication(s) Capecitabine (XELODA)   Patient requested Delivery   Delivery date: 07/05/24   Verified address: [address]  [place] [address]   Medication will be filled on 07/04/24.   This fill date is pending response to refill request from provider. Patient is aware and if they have not received fill by intended date they must follow up with pharmacy.

## 2024-07-02 NOTE — Progress Notes (Signed)
 Specialty Pharmacy Ongoing Clinical Assessment Note  Melissa Knight is a 89 y.o. female who is being followed by the specialty pharmacy service for RxSp Oncology   Patient's specialty medication(s) reviewed today: Capecitabine (XELODA)   Missed doses in the last 4 weeks: 0   Patient/Caregiver did not have any additional questions or concerns.   Therapeutic benefit summary: Unable to assess   Adverse events/side effects summary: No adverse events/side effects   Patient's therapy is appropriate to: Continue    Goals Addressed             This Visit's Progress    Achieve a cure       Patient is unable to be assessed as therapy was recently initiated. Patient will maintain adherence         Follow up: 3 months  [facility] Specialty Pharmacist

## 2024-07-03 ENCOUNTER — Inpatient Hospital Stay: Attending: Nurse Practitioner

## 2024-07-03 ENCOUNTER — Encounter: Payer: Self-pay | Admitting: Nurse Practitioner

## 2024-07-03 ENCOUNTER — Inpatient Hospital Stay (HOSPITAL_BASED_OUTPATIENT_CLINIC_OR_DEPARTMENT_OTHER): Admitting: Nurse Practitioner

## 2024-07-03 VITALS — BP 118/83 | HR 60 | Temp 98.3°F | Resp 18 | Ht 62.0 in | Wt 146.9 lb

## 2024-07-03 DIAGNOSIS — D630 Anemia in neoplastic disease: Secondary | ICD-10-CM | POA: Diagnosis not present

## 2024-07-03 DIAGNOSIS — Z8673 Personal history of transient ischemic attack (TIA), and cerebral infarction without residual deficits: Secondary | ICD-10-CM | POA: Diagnosis not present

## 2024-07-03 DIAGNOSIS — C184 Malignant neoplasm of transverse colon: Secondary | ICD-10-CM

## 2024-07-03 DIAGNOSIS — I4891 Unspecified atrial fibrillation: Secondary | ICD-10-CM | POA: Diagnosis not present

## 2024-07-03 LAB — CBC WITH DIFFERENTIAL (CANCER CENTER ONLY)
Abs Immature Granulocytes: 0.02 K/uL (ref 0.00–0.07)
Basophils Absolute: 0 K/uL (ref 0.0–0.1)
Basophils Relative: 0 %
Eosinophils Absolute: 0.1 K/uL (ref 0.0–0.5)
Eosinophils Relative: 1 %
HCT: 34.1 % — ABNORMAL LOW (ref 36.0–46.0)
Hemoglobin: 11.1 g/dL — ABNORMAL LOW (ref 12.0–15.0)
Immature Granulocytes: 0 %
Lymphocytes Relative: 34 %
Lymphs Abs: 2.6 K/uL (ref 0.7–4.0)
MCH: 30.7 pg (ref 26.0–34.0)
MCHC: 32.6 g/dL (ref 30.0–36.0)
MCV: 94.5 fL (ref 80.0–100.0)
Monocytes Absolute: 0.7 K/uL (ref 0.1–1.0)
Monocytes Relative: 9 %
Neutro Abs: 4.2 K/uL (ref 1.7–7.7)
Neutrophils Relative %: 56 %
Platelet Count: 201 K/uL (ref 150–400)
RBC: 3.61 MIL/uL — ABNORMAL LOW (ref 3.87–5.11)
RDW: 13.9 % (ref 11.5–15.5)
WBC Count: 7.7 K/uL (ref 4.0–10.5)
nRBC: 0 % (ref 0.0–0.2)

## 2024-07-03 LAB — CMP (CANCER CENTER ONLY)
ALT: 6 U/L (ref 0–44)
AST: 20 U/L (ref 15–41)
Albumin: 3.9 g/dL (ref 3.5–5.0)
Alkaline Phosphatase: 44 U/L (ref 38–126)
Anion gap: 10 (ref 5–15)
BUN: 13 mg/dL (ref 8–23)
CO2: 27 mmol/L (ref 22–32)
Calcium: 10 mg/dL (ref 8.9–10.3)
Chloride: 105 mmol/L (ref 98–111)
Creatinine: 0.77 mg/dL (ref 0.44–1.00)
GFR, Estimated: >60 mL/min (ref >60)
Glucose, Bld: 114 mg/dL — ABNORMAL HIGH (ref 70–99)
Potassium: 4.2 mmol/L (ref 3.5–5.1)
Sodium: 142 mmol/L (ref 135–145)
Total Bilirubin: 0.4 mg/dL (ref 0.0–1.2)
Total Protein: 6.3 g/dL — ABNORMAL LOW (ref 6.5–8.1)

## 2024-07-03 LAB — CEA (ACCESS): CEA (CHCC): 8.18 ng/mL — ABNORMAL HIGH (ref 0.00–5.00)

## 2024-07-03 NOTE — Progress Notes (Signed)
  [facility] OFFICE PROGRESS NOTE   Diagnosis: Colon cancer  INTERVAL HISTORY:   Melissa Knight returns as scheduled.  She completed cycle 1 adjuvant capecitabine 06/17/2024.  She denies nausea/vomiting.  No mouth sores.  No diarrhea.  No hand or foot pain or redness.  Objective:  Vital signs in last 24 hours:  Blood pressure 118/83, pulse 60, temperature 98.3 F (36.8 C), temperature source Temporal, resp. rate 18, height 5' 2 (1.575 m), weight 146 lb 14.4 oz (66.6 kg), SpO2 98%.    HEENT: No thrush or ulcers. Resp: Lungs clear bilaterally. Cardio: Regular rate and rhythm. GI: No hepatosplenomegaly.  Nontender. Vascular: Trace lower leg edema bilaterally. Skin: Palms and soles without erythema.  Soles dry appearing.   Lab Results:  Lab Results  Component Value Date   WBC 7.7 07/03/2024   HGB 11.1 (L) 07/03/2024   HCT 34.1 (L) 07/03/2024   MCV 94.5 07/03/2024   PLT 201 07/03/2024   NEUTROABS 4.2 07/03/2024    Imaging:  No results found.  Medications: I have reviewed the patient's current medications.  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, stage III (T3 N1), 2/20 nodes positive MSS, no loss of mismatch repair protein expression 04/08/2024 CT abdomen/pelvis: Annular constricting lesion in the mid transverse colon with mild proximal colonic dilation, multiple pulmonary nodules-largest 4 mm 04/13/2024 colonoscopy: Partially obstructing mass in the distal transverse colon, 2 sigmoid colon polyps removed: Transverse colon mass-hyperplastic polyp, sigmoid polyps-serrated adenoma and hyperplastic polyp 04/14/2024 CT chest: Multiple calcified granulomas, no suspicious lung nodule, no metastatic disease 04/14/2024-elevated CEA Cycle 1 Xeloda 06/17/2024 Cycle 2 Xeloda 07/08/2024   Anemia secondary #1 History of a CVA/seizure-2013 Atrial fibrillation  Disposition: [name] appears stable.  She has completed 1 cycle of  adjuvant Xeloda.  She tolerated well.  Plan to proceed with cycle 2 as scheduled 07/08/2024.    CBC and chemistry panel reviewed.  Labs adequate for cycle 2 as above.  She will return for lab and follow-up in 3 weeks.  We are available to see her sooner if needed.    Olam Ned ANP/GNP-BC   07/03/2024  2:15 PM

## 2024-07-04 ENCOUNTER — Other Ambulatory Visit: Payer: Self-pay

## 2024-07-04 ENCOUNTER — Other Ambulatory Visit (HOSPITAL_COMMUNITY): Payer: Self-pay

## 2024-07-12 ENCOUNTER — Ambulatory Visit: Admitting: Nurse Practitioner

## 2024-07-12 ENCOUNTER — Encounter: Payer: Self-pay | Admitting: Nurse Practitioner

## 2024-07-12 VITALS — BP 161/71 | HR 56 | Temp 97.4°F | Ht 62.0 in | Wt 148.0 lb

## 2024-07-12 DIAGNOSIS — G40319 Generalized idiopathic epilepsy and epileptic syndromes, intractable, without status epilepticus: Secondary | ICD-10-CM

## 2024-07-12 DIAGNOSIS — E039 Hypothyroidism, unspecified: Secondary | ICD-10-CM

## 2024-07-12 DIAGNOSIS — E782 Mixed hyperlipidemia: Secondary | ICD-10-CM

## 2024-07-12 DIAGNOSIS — N393 Stress incontinence (female) (male): Secondary | ICD-10-CM

## 2024-07-12 DIAGNOSIS — I10 Essential (primary) hypertension: Secondary | ICD-10-CM

## 2024-07-12 DIAGNOSIS — E8881 Metabolic syndrome: Secondary | ICD-10-CM

## 2024-07-12 DIAGNOSIS — Z6827 Body mass index (BMI) 27.0-27.9, adult: Secondary | ICD-10-CM

## 2024-07-12 DIAGNOSIS — I4821 Permanent atrial fibrillation: Secondary | ICD-10-CM

## 2024-07-12 DIAGNOSIS — N904 Leukoplakia of vulva: Secondary | ICD-10-CM

## 2024-07-12 DIAGNOSIS — Z23 Encounter for immunization: Secondary | ICD-10-CM

## 2024-07-12 DIAGNOSIS — Z8673 Personal history of transient ischemic attack (TIA), and cerebral infarction without residual deficits: Secondary | ICD-10-CM | POA: Diagnosis not present

## 2024-07-12 MED ORDER — ATORVASTATIN CALCIUM 10 MG PO TABS: 10.0000 mg | ORAL_TABLET | Freq: Every day | ORAL | 1 refills | Status: AC

## 2024-07-12 MED ORDER — LEVOTHYROXINE SODIUM 88 MCG PO TABS: 88.0000 ug | ORAL_TABLET | Freq: Every day | ORAL | 1 refills | Status: AC

## 2024-07-12 MED ORDER — BETAMETHASONE DIPROPIONATE 0.05 % EX CREA: TOPICAL_CREAM | CUTANEOUS | 2 refills | Status: AC

## 2024-07-12 MED ORDER — METOPROLOL TARTRATE 50 MG PO TABS: ORAL_TABLET | ORAL | 1 refills | Status: AC

## 2024-07-12 MED ORDER — AMLODIPINE BESYLATE 2.5 MG PO TABS: 2.5000 mg | ORAL_TABLET | Freq: Every day | ORAL | 1 refills | Status: AC

## 2024-07-12 MED ORDER — FUROSEMIDE 20 MG PO TABS: ORAL_TABLET | ORAL | 1 refills | Status: AC

## 2024-07-12 MED ORDER — LEVETIRACETAM 500 MG PO TABS: 500.0000 mg | ORAL_TABLET | Freq: Every day | ORAL | 1 refills | Status: AC

## 2024-07-12 NOTE — Patient Instructions (Signed)
 Fall Prevention in the Home, Adult Falls can cause injuries and can happen to people of all ages. There are many things you can do to make your home safer and to help prevent falls. What actions can I take to prevent falls? General information Use good lighting in all rooms. Make sure to: Replace any light bulbs that burn out. Turn on the lights in dark areas and use night-lights. Keep items that you use often in easy-to-reach places. Lower the shelves around your home if needed. Move furniture so that there are clear paths around it. Do not use throw rugs or other things on the floor that can make you trip. If any of your floors are uneven, fix them. Add color or contrast paint or tape to clearly mark and help you see: Grab bars or handrails. First and last steps of staircases. Where the edge of each step is. If you use a ladder or stepladder: Make sure that it is fully opened. Do not climb a closed ladder. Make sure the sides of the ladder are locked in place. Have someone hold the ladder while you use it. Know where your pets are as you move through your home. What can I do in the bathroom?     Keep the floor dry. Clean up any water on the floor right away. Remove soap buildup in the bathtub or shower. Buildup makes bathtubs and showers slippery. Use non-skid mats or decals on the floor of the bathtub or shower. Attach bath mats securely with double-sided, non-slip rug tape. If you need to sit down in the shower, use a non-slip stool. Install grab bars by the toilet and in the bathtub and shower. Do not use towel bars as grab bars. What can I do in the bedroom? Make sure that you have a light by your bed that is easy to reach. Do not use any sheets or blankets on your bed that hang to the floor. Have a firm chair or bench with side arms that you can use for support when you get dressed. What can I do in the kitchen? Clean up any spills right away. If you need to reach something  above you, use a step stool with a grab bar. Keep electrical cords out of the way. Do not use floor polish or wax that makes floors slippery. What can I do with my stairs? Do not leave anything on the stairs. Make sure that you have a light switch at the top and the bottom of the stairs. Make sure that there are handrails on both sides of the stairs. Fix handrails that are broken or loose. Install non-slip stair treads on all your stairs if they do not have carpet. Avoid having throw rugs at the top or bottom of the stairs. Choose a carpet that does not hide the edge of the steps on the stairs. Make sure that the carpet is firmly attached to the stairs. Fix carpet that is loose or worn. What can I do on the outside of my home? Use bright outdoor lighting. Fix the edges of walkways and driveways and fix any cracks. Clear paths of anything that can make you trip, such as tools or rocks. Add color or contrast paint or tape to clearly mark and help you see anything that might make you trip as you walk through a door, such as a raised step or threshold. Trim any bushes or trees on paths to your home. Check to see if handrails are loose  or broken and that both sides of all steps have handrails. Install guardrails along the edges of any raised decks and porches. Have leaves, snow, or ice cleared regularly. Use sand, salt, or ice melter on paths if you live where there is ice and snow during the winter. Clean up any spills in your garage right away. This includes grease or oil spills. What other actions can I take? Review your medicines with your doctor. Some medicines can cause dizziness or changes in blood pressure, which increase your risk of falling. Wear shoes that: Have a low heel. Do not wear high heels. Have rubber bottoms and are closed at the toe. Feel good on your feet and fit well. Use tools that help you move around if needed. These include: Canes. Walkers. Scooters. Crutches. Ask  your doctor what else you can do to help prevent falls. This may include seeing a physical therapist to learn to do exercises to move better and get stronger. Where to find more information Centers for Disease Control and Prevention, [employer]: TonerPromos.no [employer] on Aging: BaseRingTones.pl National Institute on Aging: BaseRingTones.pl Contact a doctor if: You are afraid of falling at home. You feel weak, drowsy, or dizzy at home. You fall at home. Get help right away if you: Lose consciousness or have trouble moving after a fall. Have a fall that causes a head injury. These symptoms may be an emergency. Get help right away. Call 911. Do not wait to see if the symptoms will go away. Do not drive yourself to the hospital. This information is not intended to replace advice given to you by your health care provider. Make sure you discuss any questions you have with your health care provider. Document Revised: 05/02/2022 Document Reviewed: 05/02/2022 Elsevier Patient Education  2024 [employer].

## 2024-07-12 NOTE — Progress Notes (Signed)
 Subjective:    Patient ID: Melissa Knight, female    DOB: 06/16/1933, 89 y.o.   MRN: [id number]   Chief Complaint: medical management of chronic issues     HPI:  Melissa Knight is a 89 y.o. who identifies as a female who was assigned female at birth.   Social history: Lives with: husband Work history: retired   Water Engineer in today for follow up of the following chronic medical issues:  1. Essential hypertension No c/o chest pain, sob or headache. Does not ceck blood pressure at home BP Readings from Last 3 Encounters:  07/03/24 118/83  06/10/24 (!) 145/82  05/23/24 133/67      2. Permanent atrial fibrillation (HCC) No  longer on coumadin  . Is now on eliquis  and is dong well.   3. Hypothyroidism (acquired) No issues that aware of Lab Results  Component Value Date   TSH 0.875 10/11/2022     4. Mixed hyperlipidemia Does not rally watch diet and does no dedicated exercise. Lab Results  Component Value Date   CHOL 151 05/08/2023   HDL 46 05/08/2023   LDLCALC 84 05/08/2023   TRIG 119 05/08/2023   CHOLHDL 2.8 10/11/2022     5. Hx CVA No permanent effects  6. Metabolic syndrome Does not cHeck blood sugars at home. Lab Results  Component Value Date   HGBA1C 5.9 (H) 10/11/2022     7. Stress incontinence Wears pads  8. Seizure disorder, generalized convulsive, intractable (HCC) Has not had any more seizure issues. Continues to take keppra   9. Bmi 27.0-27.9 (HCC) Weight is up 5lbs Wt Readings from Last 3 Encounters:  07/12/24 148 lb (67.1 kg)  07/03/24 146 lb 14.4 oz (66.6 kg)  06/10/24 151 lb (68.5 kg)   BMI Readings from Last 3 Encounters:  07/12/24 27.07 kg/m  07/03/24 26.87 kg/m  06/10/24 27.62 kg/m    10. 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   atorvastatin  (LIPITOR) 10 MG tablet TAKE 1 TABLET (10 MG TOTAL) BY MOUTH DAILY AT 6 PM.   betamethasone  dipropionate 0.05 % cream Apply daily as needed to affected area (Patient not taking: Reported on 07/03/2024)   calcium -vitamin D (OSCAL WITH D) 500-200 MG-UNIT tablet Take 1 tablet by mouth.   capecitabine (XELODA) 500 MG tablet Take 3 tablets (1500 mg) by mouth in AM and 2 tablets (1000 mg) in PM. Take with food. Take for 14 days, then hold for 7 days. Repeat every 21 days. Start on 07/09/2024   ferrous sulfate 324 MG TBEC Take 324 mg by mouth daily.   furosemide  (LASIX ) 20 MG tablet TAKE 1 TABLET EVERY DAY AS NEEDED   levETIRAcetam  (KEPPRA ) 500 MG tablet TAKE 1 TABLET (500 MG TOTAL) BY MOUTH DAILY.   levothyroxine  (SYNTHROID ) 88 MCG tablet TAKE 1 TABLET BY MOUTH EVERY DAY   metoprolol  tartrate (LOPRESSOR ) 50 MG tablet TAKE 2 TABLETS BY MOUTH IN MORNING AND 1 TABLET BY MOUTH EVERY NIGHT AT BEDTIME   ondansetron  (ZOFRAN ) 4 MG tablet Take 1 tablet (4 mg total) by mouth every 8 (eight) hours  as needed for nausea or vomiting. (Patient not taking: Reported on 07/03/2024)   TURMERIC PO Take 1 Dose by mouth daily.   No facility-administered encounter medications on file as of 07/12/2024.    Past Surgical History:  Procedure Laterality Date   BLADDER SURGERY     bladder prolapse   CATARACT EXTRACTION Left 07/2023   CATARACT EXTRACTION W/PHACO Left 08/04/2023   Procedure: CATARACT EXTRACTION PHACO AND INTRAOCULAR LENS PLACEMENT (IOC);  Surgeon: Juli Blunt, MD;  Location: [facility];  Service: Ophthalmology;  Laterality: Left;  CDE: 6.75   COLONOSCOPY N/A 04/13/2024   Procedure: COLONOSCOPY;  Surgeon: Elicia Claw, MD;  Location: [facility];  Service: Gastroenterology;  Laterality: N/A;    LAPAROTOMY N/A 04/17/2024   Procedure: EXPLORATORY LAPAROTOMY WITH TRANSVERSE COLECTOMY;  Surgeon: Eletha Boas, MD;  Location: [facility];  Service: General;  Laterality: N/A;   TUBAL LIGATION      Family History  Problem Relation Age of Onset   Atrial fibrillation Mother        pacemaker   Hypertension Mother    Heart disease Sister        MI in 21s   Stroke Sister    Heart disease Father    Heart disease Sister    Heart disease Sister    Hypertension Sister    Anxiety disorder Sister    Heart disease Brother    Cancer Brother        pancreatic   Heart disease Son    Stroke Son 50      Controlled substance contract: n/a     Review of Systems  Constitutional:  Negative for diaphoresis.  Eyes:  Negative for pain.  Respiratory:  Negative for shortness of breath.   Cardiovascular:  Negative for chest pain, palpitations and leg swelling.  Gastrointestinal:  Negative for abdominal pain.  Endocrine: Negative for polydipsia.  Skin:  Negative for rash.  Neurological:  Negative for dizziness, weakness and headaches.  Hematological:  Does not bruise/bleed easily.  All other systems reviewed and are negative.      Objective:   Physical Exam Vitals and nursing note reviewed.  Constitutional:      General: She is not in acute distress.    Appearance: Normal appearance. She is well-developed.  HENT:     Head: Normocephalic.     Right Ear: Tympanic membrane normal.     Left Ear: Tympanic membrane normal.     Nose: Nose normal.     Mouth/Throat:     Mouth: Mucous membranes are moist.  Eyes:     Pupils: Pupils are equal, round, and reactive to light.  Neck:     Vascular: No carotid bruit or JVD.  Cardiovascular:     Rate and Rhythm: Normal rate. Rhythm irregular.     Heart sounds: Normal heart sounds.  Pulmonary:     Effort: Pulmonary effort is normal. No respiratory distress.     Breath sounds: Normal breath sounds. No wheezing or rales.  Chest:     Chest wall: No  tenderness.  Abdominal:     General: Bowel sounds are normal. There is no distension or abdominal bruit.     Palpations: Abdomen is soft. There is no hepatomegaly, splenomegaly, mass or pulsatile mass.     Tenderness: There is no abdominal tenderness.  Musculoskeletal:        General: Normal range of motion.     Cervical back: Normal range of motion and neck supple.     Right  lower leg: Edema (1+) present.     Left lower leg: Edema (1+) present.  Lymphadenopathy:     Cervical: No cervical adenopathy.  Skin:    General: Skin is warm and dry.  Neurological:     Mental Status: She is alert and oriented to person, place, and time.     Deep Tendon Reflexes: Reflexes are normal and symmetric.  Psychiatric:        Behavior: Behavior normal.        Thought Content: Thought content normal.        Judgment: Judgment normal.     There were no vitals taken for this visit.          Assessment & Plan:  Melissa Knight comes in today with chief complaint of medical management of chronic issues    Diagnosis and orders addressed:  1. Essential hypertension Low sodium diet - amLODipine  (NORVASC ) 2.5 MG tablet; Take 1 tablet (2.5 mg total) by mouth daily.  Dispense: 90 tablet; Refill: 1 - furosemide  (LASIX ) 20 MG tablet; TAKE 1 TABLET EVERY DAY AS NEEDED  Dispense: 90 tablet; Refill: 1 - lisinopril  (ZESTRIL ) 20 MG tablet; TAKE 1 TABLET BY MOUTH TWICE A DAY  Dispense: 180 tablet; Refill: 1 - CBC with Differential/Platelet - CMP14+EGFR  2. Permanent atrial fibrillation (HCC) Avoid caffeine Hold coumadin  for 2 days then 3.75mg  daily - metoprolol  tartrate (LOPRESSOR ) 50 MG tablet; TAKE 2 TABLETS BY MOUTH IN MORNING AND 1 TABLET BY MOUTH EVERY NIGHT AT BEDTIME  Dispense: 270 tablet; Refill: 1 - warfarin (COUMADIN ) 7.5 MG tablet; Take 1 tablet (7.5 mg total) by mouth one time only at 6 PM.  Dispense: 90 tablet; Refill: 1  3. Hypothyroidism (acquired) Labs pending - levothyroxine  (SYNTHROID )  88 MCG tablet; TAKE 1 TABLET (88 MCG TOTAL) BY MOUTH DAILY.  Dispense: 90 tablet; Refill: 1 - Thyroid  Panel With TSH  4. Mixed hyperlipidemia Low fat diet - atorvastatin  (LIPITOR) 10 MG tablet; Take 1 tablet (10 mg total) by mouth daily at 6 PM.  Dispense: 90 tablet; Refill: 1 - Lipid panel  5. Late effect of cerebrovascular accident Report any one sided weakness  6. Metabolic syndrome Watch carbs in diet  7. Stress incontinence  8. Seizure disorder, generalized convulsive, intractable (HCC) - levETIRAcetam  (KEPPRA ) 500 MG tablet; Take 1 tablet (500 mg total) by mouth daily.  Dispense: 90 tablet; Refill: 1  9. Morbid obesity (HCC) Discussed diet and exercise for person with BMI >25 Will recheck weight in 3-6 months    Labs pending Health Maintenance reviewed Diet and exercise encouraged  Follow up plan: 6 month   Mary-Margaret Gladis, FNP

## 2024-07-18 ENCOUNTER — Other Ambulatory Visit: Payer: Self-pay | Admitting: Oncology

## 2024-07-18 ENCOUNTER — Other Ambulatory Visit: Payer: Self-pay

## 2024-07-18 DIAGNOSIS — C184 Malignant neoplasm of transverse colon: Secondary | ICD-10-CM

## 2024-07-18 NOTE — Telephone Encounter (Signed)
 Next cycle due 11/17-Will refill on 11/12

## 2024-07-22 ENCOUNTER — Other Ambulatory Visit: Payer: Self-pay

## 2024-07-24 ENCOUNTER — Inpatient Hospital Stay: Attending: Nurse Practitioner

## 2024-07-24 ENCOUNTER — Encounter: Payer: Self-pay | Admitting: Nurse Practitioner

## 2024-07-24 ENCOUNTER — Other Ambulatory Visit: Payer: Self-pay

## 2024-07-24 ENCOUNTER — Inpatient Hospital Stay (HOSPITAL_BASED_OUTPATIENT_CLINIC_OR_DEPARTMENT_OTHER): Admitting: Nurse Practitioner

## 2024-07-24 VITALS — BP 134/65 | HR 56 | Temp 97.7°F | Resp 18 | Ht 62.0 in | Wt 152.9 lb

## 2024-07-24 DIAGNOSIS — C184 Malignant neoplasm of transverse colon: Secondary | ICD-10-CM

## 2024-07-24 DIAGNOSIS — D630 Anemia in neoplastic disease: Secondary | ICD-10-CM | POA: Diagnosis not present

## 2024-07-24 DIAGNOSIS — Z8673 Personal history of transient ischemic attack (TIA), and cerebral infarction without residual deficits: Secondary | ICD-10-CM | POA: Diagnosis not present

## 2024-07-24 DIAGNOSIS — I4891 Unspecified atrial fibrillation: Secondary | ICD-10-CM | POA: Diagnosis not present

## 2024-07-24 LAB — CBC WITH DIFFERENTIAL (CANCER CENTER ONLY)
Abs Immature Granulocytes: 0.04 K/uL (ref 0.00–0.07)
Basophils Absolute: 0 K/uL (ref 0.0–0.1)
Basophils Relative: 0 %
Eosinophils Absolute: 0.1 K/uL (ref 0.0–0.5)
Eosinophils Relative: 1 %
HCT: 35.4 % — ABNORMAL LOW (ref 36.0–46.0)
Hemoglobin: 11.5 g/dL — ABNORMAL LOW (ref 12.0–15.0)
Immature Granulocytes: 1 %
Lymphocytes Relative: 25 %
Lymphs Abs: 2.1 K/uL (ref 0.7–4.0)
MCH: 31.5 pg (ref 26.0–34.0)
MCHC: 32.5 g/dL (ref 30.0–36.0)
MCV: 97 fL (ref 80.0–100.0)
Monocytes Absolute: 0.8 K/uL (ref 0.1–1.0)
Monocytes Relative: 9 %
Neutro Abs: 5.4 K/uL (ref 1.7–7.7)
Neutrophils Relative %: 64 %
Platelet Count: 179 K/uL (ref 150–400)
RBC: 3.65 MIL/uL — ABNORMAL LOW (ref 3.87–5.11)
RDW: 16.5 % — ABNORMAL HIGH (ref 11.5–15.5)
WBC Count: 8.5 K/uL (ref 4.0–10.5)
nRBC: 0 % (ref 0.0–0.2)

## 2024-07-24 LAB — CMP (CANCER CENTER ONLY)
ALT: 6 U/L (ref 0–44)
AST: 21 U/L (ref 15–41)
Albumin: 3.8 g/dL (ref 3.5–5.0)
Alkaline Phosphatase: 42 U/L (ref 38–126)
Anion gap: 8 (ref 5–15)
BUN: 12 mg/dL (ref 8–23)
CO2: 29 mmol/L (ref 22–32)
Calcium: 10.2 mg/dL (ref 8.9–10.3)
Chloride: 105 mmol/L (ref 98–111)
Creatinine: 0.73 mg/dL (ref 0.44–1.00)
GFR, Estimated: >60 mL/min (ref >60)
Glucose, Bld: 96 mg/dL (ref 70–99)
Potassium: 4.2 mmol/L (ref 3.5–5.1)
Sodium: 142 mmol/L (ref 135–145)
Total Bilirubin: 0.7 mg/dL (ref 0.0–1.2)
Total Protein: 6.3 g/dL — ABNORMAL LOW (ref 6.5–8.1)

## 2024-07-24 MED ORDER — CAPECITABINE 500 MG PO TABS
ORAL_TABLET | ORAL | 0 refills | Status: DC
  Filled 2024-07-24: qty 70, 21d supply, fill #0

## 2024-07-24 NOTE — Progress Notes (Signed)
  [facility] OFFICE PROGRESS NOTE   Diagnosis: Colon cancer  INTERVAL HISTORY:   Melissa Knight returns as scheduled.  She completed cycle 2 capecitabine beginning 07/08/2024.  Over the past weekend she noted that soles appeared red and felt warm, no significant pain, describes as sensitive.  Symptoms seem to be improving.  She has a callus on each foot that she believes has increased in size.  No blister type lesions.  Palms nontender and without redness.  No nausea or vomiting.  No mouth sores.  No diarrhea.  Objective:  Vital signs in last 24 hours:  Blood pressure 134/65, pulse (!) 56, temperature 97.7 F (36.5 C), temperature source Oral, resp. rate 18, height 5' 2 (1.575 m), weight 152 lb 14.4 oz (69.4 kg), SpO2 97%.    HEENT: No thrush or ulcers. Resp: Lungs clear bilaterally. Cardio: Regular rate and rhythm. GI: No hepatosplenomegaly.  Healed midline surgical incision. Vascular: Trace lower leg edema bilaterally. Skin: Palms without erythema.  Soles with mild erythema.  Callus present bilaterally medial arch.   Lab Results:  Lab Results  Component Value Date   WBC 8.5 07/24/2024   HGB 11.5 (L) 07/24/2024   HCT 35.4 (L) 07/24/2024   MCV 97.0 07/24/2024   PLT 179 07/24/2024   NEUTROABS 5.4 07/24/2024    Imaging:  No results found.  Medications: I have reviewed the patient's current medications.  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, stage III (T3 N1), 2/20 nodes positive MSS, no loss of mismatch repair protein expression 04/08/2024 CT abdomen/pelvis: Annular constricting lesion in the mid transverse colon with mild proximal colonic dilation, multiple pulmonary nodules-largest 4 mm 04/13/2024 colonoscopy: Partially obstructing mass in the distal transverse colon, 2 sigmoid colon polyps removed: Transverse colon mass-hyperplastic polyp, sigmoid polyps-serrated adenoma and hyperplastic polyp 04/14/2024 CT  chest: Multiple calcified granulomas, no suspicious lung nodule, no metastatic disease 04/14/2024-elevated CEA Cycle 1 Xeloda 06/17/2024 Cycle 2 Xeloda 07/08/2024 Cycle 3 Xeloda 07/29/2024   Anemia secondary #1 History of a CVA/seizure-2013 Atrial fibrillation  Disposition: [name] appears stable.  She has completed 2 cycles of Xeloda.  Overall she is tolerating well.  She has developed mild hand-foot syndrome on the soles.  Symptoms are improving.  If back to baseline on 07/29/2024 she will proceed with the third cycle of Xeloda at the current dose.  If symptoms are not better she will hold Xeloda and contact the office.  CBC and chemistry panel reviewed.  Labs are adequate to continue Xeloda as above.  She will return for lab and follow-up in 3 weeks.  We are available to see her sooner if needed.  Plan reviewed with Dr. Cloretta.  Olam Ned ANP/GNP-BC   07/24/2024  10:28 AM

## 2024-07-25 ENCOUNTER — Other Ambulatory Visit: Payer: Self-pay

## 2024-07-25 NOTE — Progress Notes (Signed)
 Specialty Pharmacy Refill Coordination Note  Melissa Knight is a 89 y.o. female contacted today regarding refills of specialty medication(s) Capecitabine (XELODA)   Patient requested Delivery   Delivery date: 07/26/24   Verified address: [address]  [place] [address]   Medication will be filled on: 07/25/24

## 2024-08-01 ENCOUNTER — Other Ambulatory Visit: Payer: Self-pay | Admitting: Nurse Practitioner

## 2024-08-01 DIAGNOSIS — I4821 Permanent atrial fibrillation: Secondary | ICD-10-CM

## 2024-08-07 ENCOUNTER — Other Ambulatory Visit: Payer: Self-pay | Admitting: Oncology

## 2024-08-07 ENCOUNTER — Other Ambulatory Visit (HOSPITAL_COMMUNITY): Payer: Self-pay

## 2024-08-07 DIAGNOSIS — C184 Malignant neoplasm of transverse colon: Secondary | ICD-10-CM

## 2024-08-07 NOTE — Telephone Encounter (Signed)
 Will refill on 12/02 after MD visit to assess hand/foot issues

## 2024-08-12 ENCOUNTER — Other Ambulatory Visit: Payer: Self-pay | Admitting: Oncology

## 2024-08-12 ENCOUNTER — Other Ambulatory Visit (HOSPITAL_COMMUNITY): Payer: Self-pay

## 2024-08-12 ENCOUNTER — Other Ambulatory Visit: Payer: Self-pay

## 2024-08-12 DIAGNOSIS — C184 Malignant neoplasm of transverse colon: Secondary | ICD-10-CM

## 2024-08-12 MED ORDER — CAPECITABINE 500 MG PO TABS
ORAL_TABLET | ORAL | 0 refills | Status: DC
  Filled 2024-08-12: qty 70, fill #0

## 2024-08-13 ENCOUNTER — Other Ambulatory Visit (HOSPITAL_COMMUNITY): Payer: Self-pay

## 2024-08-13 ENCOUNTER — Inpatient Hospital Stay: Attending: Nurse Practitioner

## 2024-08-13 ENCOUNTER — Inpatient Hospital Stay: Admitting: Oncology

## 2024-08-13 ENCOUNTER — Other Ambulatory Visit: Payer: Self-pay

## 2024-08-13 VITALS — BP 123/68 | HR 60 | Temp 97.8°F | Resp 18 | Ht 62.0 in | Wt 148.7 lb

## 2024-08-13 DIAGNOSIS — C184 Malignant neoplasm of transverse colon: Secondary | ICD-10-CM | POA: Diagnosis present

## 2024-08-13 DIAGNOSIS — I4891 Unspecified atrial fibrillation: Secondary | ICD-10-CM | POA: Diagnosis not present

## 2024-08-13 DIAGNOSIS — Z8673 Personal history of transient ischemic attack (TIA), and cerebral infarction without residual deficits: Secondary | ICD-10-CM | POA: Diagnosis not present

## 2024-08-13 DIAGNOSIS — D630 Anemia in neoplastic disease: Secondary | ICD-10-CM | POA: Insufficient documentation

## 2024-08-13 LAB — CBC WITH DIFFERENTIAL (CANCER CENTER ONLY)
Abs Immature Granulocytes: 0.01 K/uL (ref 0.00–0.07)
Basophils Absolute: 0 K/uL (ref 0.0–0.1)
Basophils Relative: 0 %
Eosinophils Absolute: 0.1 K/uL (ref 0.0–0.5)
Eosinophils Relative: 2 %
HCT: 35.8 % — ABNORMAL LOW (ref 36.0–46.0)
Hemoglobin: 11.8 g/dL — ABNORMAL LOW (ref 12.0–15.0)
Immature Granulocytes: 0 %
Lymphocytes Relative: 28 %
Lymphs Abs: 2.1 K/uL (ref 0.7–4.0)
MCH: 32.4 pg (ref 26.0–34.0)
MCHC: 33 g/dL (ref 30.0–36.0)
MCV: 98.4 fL (ref 80.0–100.0)
Monocytes Absolute: 0.9 K/uL (ref 0.1–1.0)
Monocytes Relative: 12 %
Neutro Abs: 4.3 K/uL (ref 1.7–7.7)
Neutrophils Relative %: 58 %
Platelet Count: 149 K/uL — ABNORMAL LOW (ref 150–400)
RBC: 3.64 MIL/uL — ABNORMAL LOW (ref 3.87–5.11)
RDW: 18.7 % — ABNORMAL HIGH (ref 11.5–15.5)
WBC Count: 7.4 K/uL (ref 4.0–10.5)
nRBC: 0 % (ref 0.0–0.2)

## 2024-08-13 LAB — CMP (CANCER CENTER ONLY)
ALT: 7 U/L (ref 0–44)
AST: 23 U/L (ref 15–41)
Albumin: 4.1 g/dL (ref 3.5–5.0)
Alkaline Phosphatase: 43 U/L (ref 38–126)
Anion gap: 10 (ref 5–15)
BUN: 11 mg/dL (ref 8–23)
CO2: 28 mmol/L (ref 22–32)
Calcium: 10.1 mg/dL (ref 8.9–10.3)
Chloride: 103 mmol/L (ref 98–111)
Creatinine: 0.75 mg/dL (ref 0.44–1.00)
GFR, Estimated: >60 mL/min (ref >60)
Glucose, Bld: 107 mg/dL — ABNORMAL HIGH (ref 70–99)
Potassium: 4 mmol/L (ref 3.5–5.1)
Sodium: 141 mmol/L (ref 135–145)
Total Bilirubin: 1.1 mg/dL (ref 0.0–1.2)
Total Protein: 6.6 g/dL (ref 6.5–8.1)

## 2024-08-13 LAB — CEA (ACCESS): CEA (CHCC): 8.23 ng/mL — ABNORMAL HIGH (ref 0.00–5.00)

## 2024-08-13 MED ORDER — CAPECITABINE 500 MG PO TABS
ORAL_TABLET | ORAL | 0 refills | Status: DC
  Filled 2024-08-13: qty 70, fill #0
  Filled 2024-08-14: qty 70, 21d supply, fill #0

## 2024-08-13 NOTE — Progress Notes (Signed)
  [facility] OFFICE PROGRESS NOTE   Diagnosis: Colon cancer  INTERVAL HISTORY:   Melissa Knight began another cycle of Xeloda  07/29/2024.  No mouth sores, nausea, or diarrhea.  She has 1 linear ulceration at a fingertip and dryness/skin thickening at the soles.  She reports the bones of her feet hurt.  Objective:  Vital signs in last 24 hours:  Blood pressure 123/68, pulse 60, temperature 97.8 F (36.6 C), temperature source Temporal, resp. rate 18, height 5' 2 (1.575 m), weight 148 lb 11.2 oz (67.4 kg), SpO2 100%.    HEENT: No thrush or ulcers Resp: Lungs clear bilaterally Cardio: Irregular GI: No hepatosplenomegaly, nontender Vascular: Trace lower leg edema on the right greater than left  Skin: 3-4 mm linear ulcer at a distal fingertip, mild hyperpigmentation and skin thickening/dryness at the palms, skin thickening with callus formation and hyperpigmentation of the soles   Lab Results:  Lab Results  Component Value Date   WBC 7.4 08/13/2024   HGB 11.8 (L) 08/13/2024   HCT 35.8 (L) 08/13/2024   MCV 98.4 08/13/2024   PLT 149 (L) 08/13/2024   NEUTROABS 4.3 08/13/2024    CMP  Lab Results  Component Value Date   NA 141 08/13/2024   K 4.0 08/13/2024   CL 103 08/13/2024   CO2 28 08/13/2024   GLUCOSE 107 (H) 08/13/2024   BUN 11 08/13/2024   CREATININE 0.75 08/13/2024   CALCIUM  10.1 08/13/2024   PROT 6.6 08/13/2024   ALBUMIN 4.1 08/13/2024   AST 23 08/13/2024   ALT 7 08/13/2024   ALKPHOS 43 08/13/2024   BILITOT 1.1 08/13/2024   GFRNONAA >60 08/13/2024   GFRAA 89 11/03/2020    Lab Results  Component Value Date   CEA1 6.5 (H) 04/14/2024   CEA 8.23 (H) 08/13/2024     Medications: I have reviewed the patient's current medications.   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, stage III (T3 N1), 2/20 nodes positive MSS, no loss of mismatch repair protein expression 04/08/2024 CT abdomen/pelvis: Annular  constricting lesion in the mid transverse colon with mild proximal colonic dilation, multiple pulmonary nodules-largest 4 mm 04/13/2024 colonoscopy: Partially obstructing mass in the distal transverse colon, 2 sigmoid colon polyps removed: Transverse colon mass-hyperplastic polyp, sigmoid polyps-serrated adenoma and hyperplastic polyp 04/14/2024 CT chest: Multiple calcified granulomas, no suspicious lung nodule, no metastatic disease 04/14/2024-elevated CEA Cycle 1 Xeloda  06/17/2024 Cycle 2 Xeloda  07/08/2024 Cycle 3 Xeloda  07/29/2024 Cycle 4 Xeloda  08/19/2024, dose reduction to 1000 mg a.m., 500 mg p.m. secondary to hand/foot symptoms   Anemia secondary #1 History of a CVA/seizure-2013 Atrial fibrillation    Disposition: Melissa Knight has completed 3 cycles of adjuvant Xeloda .  She has tolerated the treatment well aside from mild hand/foot syndrome.  She will complete cycle 4 beginning 08/19/2024.  The Xeloda  will be dose reduced to 1000 mg in the a.m. and 500 mg in the p.m. with this cycle.  She will hold Xeloda  and contact us  if the hand/foot symptoms progress.  The CEA is persistently elevated.  This is likely a benign nonspecific finding.  We will check peripheral blood for circulating tumor DNA when she returns in 3 weeks.  We will schedule surveillance imaging if the CEA rises.  Arley Hof, MD  08/13/2024  11:23 AM

## 2024-08-14 ENCOUNTER — Other Ambulatory Visit: Payer: Self-pay

## 2024-08-14 ENCOUNTER — Other Ambulatory Visit (HOSPITAL_COMMUNITY): Payer: Self-pay

## 2024-08-14 ENCOUNTER — Other Ambulatory Visit: Payer: Self-pay | Admitting: Pharmacist

## 2024-08-14 DIAGNOSIS — C184 Malignant neoplasm of transverse colon: Secondary | ICD-10-CM

## 2024-08-14 MED ORDER — CAPECITABINE 500 MG PO TABS
ORAL_TABLET | ORAL | 0 refills | Status: DC
  Filled 2024-08-14: qty 42, 21d supply, fill #0

## 2024-08-14 NOTE — Progress Notes (Signed)
 Updated quantity on Xeloda  prescription to reflect dose change entered by MD.

## 2024-08-14 NOTE — Progress Notes (Signed)
 Specialty Pharmacy Refill Coordination Note  Melissa Knight is a 89 y.o. female contacted today regarding refills of specialty medication(s) Capecitabine  (XELODA )   Patient requested Delivery   Delivery date: 08/16/24   Verified address: [address]  [place] [address]   Medication will be filled on: 08/15/24

## 2024-08-15 ENCOUNTER — Other Ambulatory Visit: Payer: Self-pay

## 2024-08-26 ENCOUNTER — Other Ambulatory Visit: Payer: Self-pay | Admitting: Oncology

## 2024-08-26 ENCOUNTER — Other Ambulatory Visit: Payer: Self-pay

## 2024-08-26 DIAGNOSIS — C184 Malignant neoplasm of transverse colon: Secondary | ICD-10-CM

## 2024-08-26 MED ORDER — CAPECITABINE 500 MG PO TABS
ORAL_TABLET | ORAL | 0 refills | Status: DC
  Filled 2024-08-27 – 2024-09-02 (×2): qty 42, 21d supply, fill #0

## 2024-08-27 ENCOUNTER — Other Ambulatory Visit: Payer: Self-pay

## 2024-08-29 ENCOUNTER — Other Ambulatory Visit: Payer: Self-pay

## 2024-09-02 ENCOUNTER — Ambulatory Visit: Payer: Self-pay

## 2024-09-02 ENCOUNTER — Other Ambulatory Visit: Payer: Self-pay

## 2024-09-02 ENCOUNTER — Other Ambulatory Visit (HOSPITAL_COMMUNITY): Payer: Self-pay

## 2024-09-03 ENCOUNTER — Inpatient Hospital Stay: Admitting: Oncology

## 2024-09-03 ENCOUNTER — Inpatient Hospital Stay

## 2024-09-03 VITALS — BP 150/78 | HR 60 | Temp 98.0°F | Resp 18 | Ht 62.0 in | Wt 146.0 lb

## 2024-09-03 DIAGNOSIS — C184 Malignant neoplasm of transverse colon: Secondary | ICD-10-CM

## 2024-09-03 LAB — CBC WITH DIFFERENTIAL (CANCER CENTER ONLY)
Abs Immature Granulocytes: 0.02 K/uL (ref 0.00–0.07)
Basophils Absolute: 0 K/uL (ref 0.0–0.1)
Basophils Relative: 1 %
Eosinophils Absolute: 0.1 K/uL (ref 0.0–0.5)
Eosinophils Relative: 2 %
HCT: 35.1 % — ABNORMAL LOW (ref 36.0–46.0)
Hemoglobin: 11.7 g/dL — ABNORMAL LOW (ref 12.0–15.0)
Immature Granulocytes: 0 %
Lymphocytes Relative: 27 %
Lymphs Abs: 1.9 K/uL (ref 0.7–4.0)
MCH: 33.4 pg (ref 26.0–34.0)
MCHC: 33.3 g/dL (ref 30.0–36.0)
MCV: 100.3 fL — ABNORMAL HIGH (ref 80.0–100.0)
Monocytes Absolute: 0.7 K/uL (ref 0.1–1.0)
Monocytes Relative: 10 %
Neutro Abs: 4.1 K/uL (ref 1.7–7.7)
Neutrophils Relative %: 60 %
Platelet Count: 143 K/uL — ABNORMAL LOW (ref 150–400)
RBC: 3.5 MIL/uL — ABNORMAL LOW (ref 3.87–5.11)
RDW: 21.2 % — ABNORMAL HIGH (ref 11.5–15.5)
WBC Count: 6.9 K/uL (ref 4.0–10.5)
nRBC: 0 % (ref 0.0–0.2)

## 2024-09-03 LAB — CMP (CANCER CENTER ONLY)
ALT: 7 U/L (ref 0–44)
AST: 23 U/L (ref 15–41)
Albumin: 4.1 g/dL (ref 3.5–5.0)
Alkaline Phosphatase: 47 U/L (ref 38–126)
Anion gap: 8 (ref 5–15)
BUN: 11 mg/dL (ref 8–23)
CO2: 29 mmol/L (ref 22–32)
Calcium: 10.2 mg/dL (ref 8.9–10.3)
Chloride: 105 mmol/L (ref 98–111)
Creatinine: 0.72 mg/dL (ref 0.44–1.00)
GFR, Estimated: >60 mL/min
Glucose, Bld: 104 mg/dL — ABNORMAL HIGH (ref 70–99)
Potassium: 4.5 mmol/L (ref 3.5–5.1)
Sodium: 142 mmol/L (ref 135–145)
Total Bilirubin: 0.7 mg/dL (ref 0.0–1.2)
Total Protein: 6.5 g/dL (ref 6.5–8.1)

## 2024-09-03 LAB — CEA (ACCESS): CEA (CHCC): 8.67 ng/mL — ABNORMAL HIGH (ref 0.00–5.00)

## 2024-09-03 NOTE — Progress Notes (Signed)
 " [facility] OFFICE PROGRESS NOTE   Diagnosis: Colon cancer  INTERVAL HISTORY:   [name] began another cycle of Xeloda  08/19/2024.  No mouth sores, nausea, diarrhea, or hand/foot pain.  The skin changes at the hands and feet have improved.  She reports burning, tearing, and photosensitivity of the eyes for the past week.  No vision change.  Objective:  Vital signs in last 24 hours:  Blood pressure (!) 150/78, pulse 60, temperature 98 F (36.7 C), temperature source Temporal, resp. rate 18, height 5' 2 (1.575 m), weight 146 lb (66.2 kg), SpO2 96%.    HEENT: No thrush or ulcers, mild lower lid conjunctival erythema, no apparent tearing Resp: Lungs clear bilaterally Cardio: Irregular GI: No hepatosplenomegaly, nontender Vascular: Trace lower leg edema bilaterally Skin: Mild skin sickening at the distal fingertips, hyperpigmentation, dryness, superficial desquamation at the soles   Lab Results:  Lab Results  Component Value Date   WBC 6.9 09/03/2024   HGB 11.7 (L) 09/03/2024   HCT 35.1 (L) 09/03/2024   MCV 100.3 (H) 09/03/2024   PLT 143 (L) 09/03/2024   NEUTROABS 4.1 09/03/2024    CMP  Lab Results  Component Value Date   NA 142 09/03/2024   K 4.5 09/03/2024   CL 105 09/03/2024   CO2 29 09/03/2024   GLUCOSE 104 (H) 09/03/2024   BUN 11 09/03/2024   CREATININE 0.72 09/03/2024   CALCIUM  10.2 09/03/2024   PROT 6.5 09/03/2024   ALBUMIN 4.1 09/03/2024   AST 23 09/03/2024   ALT 7 09/03/2024   ALKPHOS 47 09/03/2024   BILITOT 0.7 09/03/2024   GFRNONAA >60 09/03/2024   GFRAA 89 11/03/2020    Lab Results  Component Value Date   CEA1 6.5 (H) 04/14/2024   CEA 8.23 (H) 08/13/2024     Medications: I have reviewed the patient's current medications.   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, stage III (T3 N1), 2/20 nodes positive MSS, no loss of mismatch repair protein expression 04/08/2024 CT  abdomen/pelvis: Annular constricting lesion in the mid transverse colon with mild proximal colonic dilation, multiple pulmonary nodules-largest 4 mm 04/13/2024 colonoscopy: Partially obstructing mass in the distal transverse colon, 2 sigmoid colon polyps removed: Transverse colon mass-hyperplastic polyp, sigmoid polyps-serrated adenoma and hyperplastic polyp 04/14/2024 CT chest: Multiple calcified granulomas, no suspicious lung nodule, no metastatic disease 04/14/2024-elevated CEA Cycle 1 Xeloda  06/17/2024 Cycle 2 Xeloda  07/08/2024 Cycle 3 Xeloda  07/29/2024 Cycle 4 Xeloda  08/19/2024, dose reduction to 1000 mg a.m., 500 mg p.m. secondary to hand/foot symptoms Cycle 5 Xeloda  09/09/2024   Anemia secondary #1 History of a CVA/seizure-2013 Atrial fibrillation    Disposition: Ms. [name] has completed 4 cycles of adjuvant Xeloda .  The Xeloda  was dose reduced with cycle 4.  Hand/foot symptoms have improved.  She has developed eye burning/tearing.  The eye symptoms are likely related to Xeloda .  She is scheduled to begin another cycle of Xeloda  09/09/2024.  She will contact us  if the eye symptoms have not improved by 09/09/2024.SABRA  The CEA remains elevated.  She understands could indicate residual/progressive colon cancer.  We submitted peripheral blood for circulating tumor DNA testing today.  We will contact her with these results.  The plan is to refer her for CT scans if the guardant reveal testing is positive.      Arley Hof, MD  09/03/2024  2:03 PM   [facility] OFFICE PROGRESS NOTE   Diagnosis:    Arley Hof, MD  09/03/2024  2:03 PM   "

## 2024-09-04 ENCOUNTER — Other Ambulatory Visit: Payer: Self-pay

## 2024-09-04 ENCOUNTER — Other Ambulatory Visit: Payer: Self-pay | Admitting: Pharmacy Technician

## 2024-09-04 NOTE — Progress Notes (Signed)
 Specialty Pharmacy Refill Coordination Note  Melissa Knight is a 89 y.o. female contacted today regarding refills of specialty medication(s) Capecitabine  (XELODA )   Patient requested Delivery   Delivery date: 09/06/24   Verified address: [address]  [place] [id number]   Medication will be filled on: 09/04/24  Patient is aware that deliver may be as late as Monday September 09, 2024 due to Christmas holiday.

## 2024-09-09 ENCOUNTER — Inpatient Hospital Stay: Admitting: Oncology

## 2024-09-09 ENCOUNTER — Inpatient Hospital Stay

## 2024-09-17 ENCOUNTER — Other Ambulatory Visit: Payer: Self-pay

## 2024-09-18 ENCOUNTER — Other Ambulatory Visit (HOSPITAL_COMMUNITY): Payer: Self-pay

## 2024-09-18 ENCOUNTER — Other Ambulatory Visit: Payer: Self-pay | Admitting: Oncology

## 2024-09-18 ENCOUNTER — Encounter: Payer: Self-pay | Admitting: Pharmacist

## 2024-09-18 DIAGNOSIS — C184 Malignant neoplasm of transverse colon: Secondary | ICD-10-CM

## 2024-09-18 MED ORDER — CAPECITABINE 500 MG PO TABS
ORAL_TABLET | ORAL | 0 refills | Status: DC
  Filled 2024-09-19: qty 42, fill #0
  Filled 2024-09-19: qty 42, 21d supply, fill #0

## 2024-09-19 ENCOUNTER — Other Ambulatory Visit: Payer: Self-pay

## 2024-09-19 ENCOUNTER — Other Ambulatory Visit (HOSPITAL_COMMUNITY): Payer: Self-pay

## 2024-09-20 ENCOUNTER — Other Ambulatory Visit: Payer: Self-pay

## 2024-09-20 ENCOUNTER — Other Ambulatory Visit: Payer: Self-pay | Admitting: Pharmacy Technician

## 2024-09-20 ENCOUNTER — Other Ambulatory Visit: Payer: Self-pay | Admitting: Pharmacist

## 2024-09-20 NOTE — Progress Notes (Signed)
 Specialty Pharmacy Ongoing Clinical Assessment Note  Melissa Knight is a 89 y.o. female who is being followed by the specialty pharmacy service for RxSp Oncology   Patient's specialty medication(s) reviewed today: Capecitabine  (XELODA )   Missed doses in the last 4 weeks: 0   Patient/Caregiver did not have any additional questions or concerns.   Therapeutic benefit summary: Patient is achieving benefit   No data recorded  No data recorded   Goals Addressed             This Visit's Progress    Achieve a cure   On track    Patient has completed 4 cycles of Xeloda . Her CEA remains elevated. Patient will maintain adherence.         Follow up: 3 months  Lyle LELON Chalk Specialty Pharmacist

## 2024-09-20 NOTE — Progress Notes (Signed)
 Specialty Pharmacy Refill Coordination Note  Melissa Knight is a 89 y.o. female contacted today regarding refills of specialty medication(s) Capecitabine  (XELODA )   Patient requested Delivery   Delivery date: 09/25/24   Verified address: [address]  [name] [id number]   Medication will be filled on: 09/24/24

## 2024-09-24 ENCOUNTER — Other Ambulatory Visit: Payer: Self-pay

## 2024-09-25 ENCOUNTER — Inpatient Hospital Stay: Attending: Nurse Practitioner

## 2024-09-25 ENCOUNTER — Inpatient Hospital Stay: Admitting: Nurse Practitioner

## 2024-09-25 ENCOUNTER — Telehealth: Payer: Self-pay | Admitting: Nurse Practitioner

## 2024-09-25 ENCOUNTER — Encounter: Payer: Self-pay | Admitting: Nurse Practitioner

## 2024-09-25 VITALS — BP 144/89 | HR 60 | Temp 98.2°F | Resp 18 | Ht 62.0 in | Wt 143.5 lb

## 2024-09-25 DIAGNOSIS — C184 Malignant neoplasm of transverse colon: Secondary | ICD-10-CM | POA: Diagnosis not present

## 2024-09-25 LAB — CBC WITH DIFFERENTIAL (CANCER CENTER ONLY)
Abs Immature Granulocytes: 0.01 K/uL (ref 0.00–0.07)
Basophils Absolute: 0 K/uL (ref 0.0–0.1)
Basophils Relative: 1 %
Eosinophils Absolute: 0.1 K/uL (ref 0.0–0.5)
Eosinophils Relative: 1 %
HCT: 37.1 % (ref 36.0–46.0)
Hemoglobin: 12.7 g/dL (ref 12.0–15.0)
Immature Granulocytes: 0 %
Lymphocytes Relative: 31 %
Lymphs Abs: 2 K/uL (ref 0.7–4.0)
MCH: 34.9 pg — ABNORMAL HIGH (ref 26.0–34.0)
MCHC: 34.2 g/dL (ref 30.0–36.0)
MCV: 101.9 fL — ABNORMAL HIGH (ref 80.0–100.0)
Monocytes Absolute: 0.7 K/uL (ref 0.1–1.0)
Monocytes Relative: 10 %
Neutro Abs: 3.8 K/uL (ref 1.7–7.7)
Neutrophils Relative %: 57 %
Platelet Count: 156 K/uL (ref 150–400)
RBC: 3.64 MIL/uL — ABNORMAL LOW (ref 3.87–5.11)
RDW: 20 % — ABNORMAL HIGH (ref 11.5–15.5)
WBC Count: 6.5 K/uL (ref 4.0–10.5)
nRBC: 0 % (ref 0.0–0.2)

## 2024-09-25 LAB — CMP (CANCER CENTER ONLY)
ALT: 7 U/L (ref 0–44)
AST: 23 U/L (ref 15–41)
Albumin: 4.4 g/dL (ref 3.5–5.0)
Alkaline Phosphatase: 48 U/L (ref 38–126)
Anion gap: 10 (ref 5–15)
BUN: 12 mg/dL (ref 8–23)
CO2: 29 mmol/L (ref 22–32)
Calcium: 9.9 mg/dL (ref 8.9–10.3)
Chloride: 102 mmol/L (ref 98–111)
Creatinine: 0.75 mg/dL (ref 0.44–1.00)
GFR, Estimated: >60 mL/min
Glucose, Bld: 87 mg/dL (ref 70–99)
Potassium: 4.3 mmol/L (ref 3.5–5.1)
Sodium: 141 mmol/L (ref 135–145)
Total Bilirubin: 0.8 mg/dL (ref 0.0–1.2)
Total Protein: 6.7 g/dL (ref 6.5–8.1)

## 2024-09-25 LAB — CEA (ACCESS): CEA (CHCC): 9.71 ng/mL — ABNORMAL HIGH (ref 0.00–5.00)

## 2024-09-25 NOTE — Progress Notes (Signed)
" °  [facility] OFFICE PROGRESS NOTE   Diagnosis: Colon cancer  INTERVAL HISTORY:   Melissa Knight returns as scheduled.  She completed cycle 5 Xeloda  beginning 09/09/2024.  She denies nausea/vomiting.  No mouth sores.  No diarrhea.  No hand or foot pain or redness.  Objective:  Vital signs in last 24 hours:  Blood pressure (!) 144/89, pulse 60, temperature 98.2 F (36.8 C), temperature source Temporal, resp. rate 18, height 5' 2 (1.575 m), weight 143 lb 8 oz (65.1 kg), SpO2 98%.    HEENT: No thrush or ulcers. Resp: Lungs clear bilaterally. Cardio: Regular rate and rhythm. GI: No hepatosplenomegaly.  Nontender. Vascular: Trace lower leg edema bilaterally. Skin: Palms with mild skin thickening, hyperpigmentation.  Soles with mild erythema, superficial desquamation.   Lab Results:  Lab Results  Component Value Date   WBC 6.9 09/03/2024   HGB 11.7 (L) 09/03/2024   HCT 35.1 (L) 09/03/2024   MCV 100.3 (H) 09/03/2024   PLT 143 (L) 09/03/2024   NEUTROABS 4.1 09/03/2024    Imaging:  No results found.  Medications: I have reviewed the patient's current medications.  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, stage III (T3 N1), 2/20 nodes positive MSS, no loss of mismatch repair protein expression 04/08/2024 CT abdomen/pelvis: Annular constricting lesion in the mid transverse colon with mild proximal colonic dilation, multiple pulmonary nodules-largest 4 mm 04/13/2024 colonoscopy: Partially obstructing mass in the distal transverse colon, 2 sigmoid colon polyps removed: Transverse colon mass-hyperplastic polyp, sigmoid polyps-serrated adenoma and hyperplastic polyp 04/14/2024 CT chest: Multiple calcified granulomas, no suspicious lung nodule, no metastatic disease 04/14/2024-elevated CEA Cycle 1 Xeloda  06/17/2024 Cycle 2 Xeloda  07/08/2024 Cycle 3 Xeloda  07/29/2024 Cycle 4 Xeloda  08/19/2024, dose reduction to 1000 mg a.m., 500 mg  p.m. secondary to hand/foot symptoms Cycle 5 Xeloda  09/09/2024 09/10/2025 ct DNA negative Cycle 6 Xeloda  09/30/2024   Anemia secondary #1 History of a CVA/seizure-2013 Atrial fibrillation  Disposition: Melissa Knight appears stable.  She has completed 5 cycles of adjuvant Xeloda .  Overall tolerating well.  She will begin cycle 6 09/30/2024.  We reviewed the overall plan is for her to complete a total of 8 cycles.  We reviewed the negative circulating tumor DNA testing.  CBC and chemistry panel reviewed.  Labs are adequate to proceed with the next cycle of Xeloda  as above.  She will return for follow-up and treatment in 3 weeks.  We are available to see her sooner if needed.    Olam Ned ANP/GNP-BC   09/25/2024  1:40 PM        "

## 2024-09-25 NOTE — Telephone Encounter (Signed)
 Vm left with patient with upcoming appointment information. AVS x 2 mailed to patient per her request.

## 2024-10-08 ENCOUNTER — Other Ambulatory Visit: Payer: Self-pay | Admitting: Oncology

## 2024-10-08 ENCOUNTER — Ambulatory Visit

## 2024-10-08 ENCOUNTER — Other Ambulatory Visit: Payer: Self-pay

## 2024-10-08 DIAGNOSIS — C184 Malignant neoplasm of transverse colon: Secondary | ICD-10-CM

## 2024-10-08 MED ORDER — CAPECITABINE 500 MG PO TABS
ORAL_TABLET | ORAL | 0 refills | Status: AC
  Filled 2024-10-08: qty 42, fill #0
  Filled 2024-10-09 – 2024-10-15 (×2): qty 42, 21d supply, fill #0

## 2024-10-09 ENCOUNTER — Other Ambulatory Visit (HOSPITAL_COMMUNITY): Payer: Self-pay

## 2024-10-09 ENCOUNTER — Other Ambulatory Visit: Payer: Self-pay

## 2024-10-11 ENCOUNTER — Other Ambulatory Visit (HOSPITAL_COMMUNITY): Payer: Self-pay

## 2024-10-15 ENCOUNTER — Other Ambulatory Visit: Payer: Self-pay

## 2024-10-15 ENCOUNTER — Telehealth: Payer: Self-pay | Admitting: Oncology

## 2024-10-15 NOTE — Telephone Encounter (Signed)
 Returned PT call to reschedule appt; time on 2/25 was too early, rescheduled PT for a later time. Day and time confirmed.

## 2024-10-16 ENCOUNTER — Inpatient Hospital Stay: Admitting: Nurse Practitioner

## 2024-10-16 ENCOUNTER — Inpatient Hospital Stay

## 2024-11-05 ENCOUNTER — Inpatient Hospital Stay

## 2024-11-05 ENCOUNTER — Inpatient Hospital Stay: Admitting: Oncology

## 2024-11-06 ENCOUNTER — Inpatient Hospital Stay

## 2024-11-06 ENCOUNTER — Inpatient Hospital Stay: Admitting: Nurse Practitioner

## 2024-11-06 ENCOUNTER — Inpatient Hospital Stay: Admitting: Oncology

## 2024-11-27 ENCOUNTER — Inpatient Hospital Stay: Admitting: Oncology

## 2024-11-27 ENCOUNTER — Inpatient Hospital Stay

## 2025-01-09 ENCOUNTER — Ambulatory Visit: Admitting: Nurse Practitioner
